# Patient Record
Sex: Male | Born: 1940 | Race: White | Hispanic: No | Smoking: Never smoker
Health system: Southern US, Community
[De-identification: ages and names within clinical notes are randomized; demographics above are authoritative.]

## PROBLEM LIST (undated history)

## (undated) DIAGNOSIS — G4730 Sleep apnea, unspecified: Secondary | ICD-10-CM

## (undated) DIAGNOSIS — R413 Other amnesia: Secondary | ICD-10-CM

## (undated) DIAGNOSIS — E039 Hypothyroidism, unspecified: Secondary | ICD-10-CM

## (undated) DIAGNOSIS — C801 Malignant (primary) neoplasm, unspecified: Secondary | ICD-10-CM

## (undated) DIAGNOSIS — R296 Repeated falls: Secondary | ICD-10-CM

## (undated) DIAGNOSIS — IMO0001 Reserved for inherently not codable concepts without codable children: Secondary | ICD-10-CM

## (undated) DIAGNOSIS — E785 Hyperlipidemia, unspecified: Secondary | ICD-10-CM

## (undated) DIAGNOSIS — K219 Gastro-esophageal reflux disease without esophagitis: Secondary | ICD-10-CM

## (undated) HISTORY — PX: NASAL SEPTUM SURGERY: SHX37

## (undated) HISTORY — PX: TONSILLECTOMY AND ADENOIDECTOMY: SUR1326

## (undated) HISTORY — PX: HEMORRHOID SURGERY: SHX153

## (undated) HISTORY — PX: SHOULDER SURGERY: SHX246

## (undated) HISTORY — DX: Hyperlipidemia, unspecified: E78.5

## (undated) HISTORY — DX: Sleep apnea, unspecified: G47.30

## (undated) HISTORY — DX: Gastro-esophageal reflux disease without esophagitis: K21.9

## (undated) HISTORY — DX: Reserved for inherently not codable concepts without codable children: IMO0001

## (undated) HISTORY — DX: Hypothyroidism, unspecified: E03.9

---

## 2004-02-19 ENCOUNTER — Emergency Department (HOSPITAL_COMMUNITY): Admission: EM | Admit: 2004-02-19 | Discharge: 2004-02-19 | Payer: Self-pay | Admitting: Emergency Medicine

## 2012-09-25 ENCOUNTER — Encounter: Payer: Self-pay | Admitting: *Deleted

## 2012-09-26 DIAGNOSIS — Z283 Underimmunization status: Secondary | ICD-10-CM | POA: Diagnosis not present

## 2012-09-26 DIAGNOSIS — Z2839 Other underimmunization status: Secondary | ICD-10-CM | POA: Diagnosis not present

## 2012-09-26 DIAGNOSIS — S61409A Unspecified open wound of unspecified hand, initial encounter: Secondary | ICD-10-CM | POA: Diagnosis not present

## 2012-09-27 ENCOUNTER — Ambulatory Visit (INDEPENDENT_AMBULATORY_CARE_PROVIDER_SITE_OTHER): Payer: Medicare Other | Admitting: Family Medicine

## 2012-09-27 ENCOUNTER — Encounter: Payer: Self-pay | Admitting: Family Medicine

## 2012-09-27 VITALS — BP 122/80 | HR 70 | Ht 67.0 in | Wt 206.1 lb

## 2012-09-27 DIAGNOSIS — E039 Hypothyroidism, unspecified: Secondary | ICD-10-CM | POA: Diagnosis not present

## 2012-09-27 NOTE — Progress Notes (Signed)
  Subjective:    Patient ID: Omar Lee, male    DOB: [birth year], 72 y.o.   MRN: [id number]  HPI Compliant with medes. No sig changesno new blood tests.  No symptoms of hyperthyroidism no symptoms of hypothyroidism. Patient states doing fine. He understands he needs further blood work. No results found for this or any previous visit.  Review of Systems ROS otherwise negative    Objective:   Physical Exam  Alert no acute distress. Lungs clear. Heart regular rate and rhythm. HEENT normal. Thyroid nonpalpable      Assessment & Plan:  Impression #1 hypothyroidism plan appropriate blood work. patient declines all other screening tests and recommendations. 15 minutes spent most in discussion. WSL

## 2012-09-27 NOTE — Patient Instructions (Addendum)
Take the medicine daily

## 2012-09-28 LAB — TSH: TSH: 20.618 u[IU]/mL — ABNORMAL HIGH (ref 0.350–4.500)

## 2012-09-30 ENCOUNTER — Other Ambulatory Visit: Payer: Self-pay

## 2012-09-30 MED ORDER — LEVOTHYROXINE SODIUM 125 MCG PO TABS: 125.0000 ug | ORAL_TABLET | Freq: Every day | ORAL | Status: DC

## 2012-10-10 DIAGNOSIS — S61409A Unspecified open wound of unspecified hand, initial encounter: Secondary | ICD-10-CM | POA: Diagnosis not present

## 2012-11-20 ENCOUNTER — Emergency Department (HOSPITAL_COMMUNITY)
Admission: EM | Admit: 2012-11-20 | Discharge: 2012-11-20 | Disposition: A | Discharge status: Home / Self Care | Payer: Medicare Other | Attending: Emergency Medicine | Admitting: Emergency Medicine

## 2012-11-20 ENCOUNTER — Encounter (HOSPITAL_COMMUNITY): Payer: Self-pay

## 2012-11-20 ENCOUNTER — Other Ambulatory Visit (INDEPENDENT_AMBULATORY_CARE_PROVIDER_SITE_OTHER): Payer: Self-pay | Admitting: *Deleted

## 2012-11-20 ENCOUNTER — Emergency Department (HOSPITAL_COMMUNITY): Payer: Medicare Other

## 2012-11-20 DIAGNOSIS — T18128A Food in esophagus causing other injury, initial encounter: Secondary | ICD-10-CM

## 2012-11-20 DIAGNOSIS — Z8719 Personal history of other diseases of the digestive system: Secondary | ICD-10-CM | POA: Diagnosis not present

## 2012-11-20 DIAGNOSIS — IMO0002 Reserved for concepts with insufficient information to code with codable children: Secondary | ICD-10-CM | POA: Insufficient documentation

## 2012-11-20 DIAGNOSIS — T17208A Unspecified foreign body in pharynx causing other injury, initial encounter: Secondary | ICD-10-CM | POA: Diagnosis not present

## 2012-11-20 DIAGNOSIS — T18108A Unspecified foreign body in esophagus causing other injury, initial encounter: Secondary | ICD-10-CM | POA: Diagnosis not present

## 2012-11-20 DIAGNOSIS — Y9389 Activity, other specified: Secondary | ICD-10-CM | POA: Insufficient documentation

## 2012-11-20 DIAGNOSIS — E039 Hypothyroidism, unspecified: Secondary | ICD-10-CM | POA: Insufficient documentation

## 2012-11-20 DIAGNOSIS — Z8639 Personal history of other endocrine, nutritional and metabolic disease: Secondary | ICD-10-CM | POA: Insufficient documentation

## 2012-11-20 DIAGNOSIS — Z79899 Other long term (current) drug therapy: Secondary | ICD-10-CM | POA: Diagnosis not present

## 2012-11-20 DIAGNOSIS — R1310 Dysphagia, unspecified: Secondary | ICD-10-CM

## 2012-11-20 DIAGNOSIS — Y929 Unspecified place or not applicable: Secondary | ICD-10-CM | POA: Insufficient documentation

## 2012-11-20 DIAGNOSIS — Z862 Personal history of diseases of the blood and blood-forming organs and certain disorders involving the immune mechanism: Secondary | ICD-10-CM | POA: Diagnosis not present

## 2012-11-20 DIAGNOSIS — R079 Chest pain, unspecified: Secondary | ICD-10-CM | POA: Diagnosis not present

## 2012-11-20 DIAGNOSIS — K228 Other specified diseases of esophagus: Secondary | ICD-10-CM | POA: Insufficient documentation

## 2012-11-20 DIAGNOSIS — Z9889 Other specified postprocedural states: Secondary | ICD-10-CM | POA: Diagnosis not present

## 2012-11-20 DIAGNOSIS — K2289 Other specified disease of esophagus: Secondary | ICD-10-CM | POA: Insufficient documentation

## 2012-11-20 LAB — CBC WITH DIFFERENTIAL/PLATELET
Basophils Absolute: 0 10*3/uL (ref 0.0–0.1)
Basophils Relative: 0 % (ref 0–1)
Hemoglobin: 13.5 g/dL (ref 13.0–17.0)
MCHC: 34.1 g/dL (ref 30.0–36.0)
Neutro Abs: 15.8 10*3/uL — ABNORMAL HIGH (ref 1.7–7.7)
Neutrophils Relative %: 91 % — ABNORMAL HIGH (ref 43–77)
RDW: 13.9 % (ref 11.5–15.5)
WBC: 17.5 10*3/uL — ABNORMAL HIGH (ref 4.0–10.5)

## 2012-11-20 LAB — BASIC METABOLIC PANEL
Chloride: 103 mEq/L (ref 96–112)
GFR calc Af Amer: >90 mL/min (ref >90)
Potassium: 3.9 mEq/L (ref 3.5–5.1)
Sodium: 139 mEq/L (ref 135–145)

## 2012-11-20 NOTE — ED Notes (Signed)
Pt reports feels like has a piece of ham stuck in throat since eating breakfast this am.  Pt alert, talkative.  No cough, drooling, or SOB.  Pt says hurts to swallow.

## 2012-11-20 NOTE — ED Notes (Signed)
Pt reports he got the hiccups right before he went to xray and when he stood for film he felt like pc of food was dislodged from throat. Pt denies SOB and the inability to swallow own saliva.

## 2012-11-20 NOTE — ED Notes (Signed)
Pt presents from urgent care after being referred here secondary to a pc of breakfast ham caught in his throat this morning. Pt reports this has happened before, and appears to be worsening. No respiratory distress noted. Pt is able to swallow his own saliva. Denies SOB, and emesis. Pt reports epigastric discomfort at this time. Will continue to monitor.

## 2012-11-20 NOTE — ED Provider Notes (Signed)
CSN: [id number]     Arrival date & time 11/20/12  1010 History    This chart was scribed for Gilda Crease, MD,  by Ashley Jacobs, ED Scribe. The patient was seen in room APA10/APA10 and the patient's care was started at 10:28 AM    First MD Initiated Contact with Patient 11/20/12 1019     Chief Complaint  Patient presents with  . foreign body in throat    (Consider location/radiation/quality/duration/timing/severity/associated sxs/prior Treatment) The history is provided by the patient and medical records. No language interpreter was used.   HPI Comments: Omar Lee is a 72 y.o. male who presents to the Emergency Department complaining of foreign body in throat after eating breakfast the morning PTA. Pt is able to talk and is alert. Pt reports that he is able to swallow water however he experiences moderate constant upper esophageal pain soon afterwards.  Pt mentions that he has hx of food getting lodged in his throat and it is intermittently, gradually worsening. Denies CP and trouble breathing. Pt has hx of acid reflux and denies any current episodes.    Past Medical History  Diagnosis Date  . Sleep apnea   . Reflux   . Hypothyroidism   . Hyperlipidemia    Past Surgical History  Procedure Laterality Date  . Tonsillectomy and adenoidectomy    . Shoulder surgery Right   . Hemorrhoid surgery    . Nasal septum surgery     Family History  Problem Relation Age of Onset  . Hypertension Mother   . Heart attack Mother   . Heart attack Father   . Diabetes Brother    History  Substance Use Topics  . Smoking status: Never Smoker   . Smokeless tobacco: Not on file  . Alcohol Use: No    Review of Systems  HENT: Positive for trouble swallowing.        Throat pain  Respiratory: Negative for choking and shortness of breath.   Cardiovascular: Negative for chest pain.  All other systems reviewed and are negative.    Allergies  Review of patient's allergies  indicates no known allergies.  Home Medications   Current Outpatient Rx  Name  Route  Sig  Dispense  Refill  . levothyroxine (SYNTHROID) 125 MCG tablet   Oral   Take 1 tablet (125 mcg total) by mouth daily.   90 tablet   1    BP 136/66  Pulse 88  Temp(Src) 98.2 F (36.8 C) (Oral)  Resp 18  Ht 5\' 7"  (1.702 m)  Wt 195 lb (88.451 kg)  BMI 30.53 kg/m2  SpO2 99% Physical Exam  Nursing note and vitals reviewed. Constitutional: He is oriented to person, place, and time. He appears well-developed and well-nourished. No distress.  HENT:  Head: Normocephalic and atraumatic.  Right Ear: Hearing normal.  Left Ear: Hearing normal.  Nose: Nose normal.  Mouth/Throat: Oropharynx is clear and moist and mucous membranes are normal.  Eyes: Conjunctivae and EOM are normal. Pupils are equal, round, and reactive to light.  Neck: Normal range of motion. Neck supple.  Cardiovascular: Regular rhythm, S1 normal and S2 normal.  Exam reveals no gallop and no friction rub.   No murmur heard. Pulmonary/Chest: Effort normal and breath sounds normal. No respiratory distress. He exhibits no tenderness.  Abdominal: Soft. Normal appearance and bowel sounds are normal. There is no hepatosplenomegaly. There is no tenderness. There is no rebound, no guarding, no tenderness at McBurney's point and negative Murphy's  sign. No hernia.  Musculoskeletal: Normal range of motion.  Neurological: He is alert and oriented to person, place, and time. He has normal strength. No cranial nerve deficit or sensory deficit. Coordination normal. GCS eye subscore is 4. GCS verbal subscore is 5. GCS motor subscore is 6.  Skin: Skin is warm, dry and intact. No rash noted. No cyanosis.  Psychiatric: He has a normal mood and affect. His speech is normal and behavior is normal. Thought content normal.    ED Course  DIAGNOSTIC STUDIES: Oxygen Saturation is 99% on room air, normal by my interpretation.    COORDINATION OF CARE: 10:31  AM Discussed course of care with pt which includes chest x-ray. Pt understands and agrees.     Procedures (including critical care time)  Labs Reviewed  CBC WITH DIFFERENTIAL  BASIC METABOLIC PANEL   Dg Neck Soft Tissue  11/20/2012   *RADIOLOGY REPORT*  Clinical Data: Food stuck in throat, pain with swallowing  NECK SOFT TISSUES - 1+ VIEW  Comparison: None.  Findings: The lateral view of the neck is somewhat light in technique.  However no opaque foreign body is seen.  The cervical vertebrae are in normal alignment with degenerative disc disease at C5-6 and C6-7 levels.  The epiglottis appears normal.  IMPRESSION: No foreign body is seen.  Degenerative change is noted in the lower cervical spine.   Original Report Authenticated By: Dwyane Dee, M.D.   Dg Chest 2 View  11/20/2012   *RADIOLOGY REPORT*  Clinical Data: Food stuck in the throat.  Chest pain.  CHEST - 2 VIEW  Comparison: No priors.  Findings: Lung volumes are normal.  No consolidative airspace disease.  No pleural effusions.  No pneumothorax.  No pulmonary nodule or mass noted.  Pulmonary vasculature and the cardiomediastinal silhouette are within normal limits. Atherosclerosis in the thoracic aorta.  IMPRESSION: 1. No radiographic evidence of acute cardiopulmonary disease. 2.  Atherosclerosis.   Original Report Authenticated By: Trudie Reed, M.D.   Diagnosis: Esophageal impaction - resolved  MDM  Patient presents to the ER because he thinks he has a piece of ham stuck in his throat. Patient reports that over the last month he has had 2 to his food very well because it has been getting stuck when he swallows. He reports that in the past has always eventually passed, but today he had a piece of ham stick and it feels like it is still stuck in the upper throat. Reports pain when he swallows.  Workup negative. During the time period that he was radiology felt the blockage past. Patient now swelling without difficulty. History seems  consistent with esophageal stricture which will require evaluation. Discussed briefly with Doctor Karilyn Cota, he will contact the patient at home and will see the patient in the next 2 days. Patient will return if he has any further problems.  I personally performed the services described in this documentation, which was scribed in my presence. The recorded information has been reviewed and is accurate.     Gilda Crease, MD 11/20/12 1240

## 2012-11-21 ENCOUNTER — Encounter (HOSPITAL_COMMUNITY): Payer: Self-pay | Admitting: Pharmacy Technician

## 2012-11-22 ENCOUNTER — Encounter (HOSPITAL_COMMUNITY)
Admission: RE | Disposition: A | Discharge status: Home / Self Care | Payer: Self-pay | Source: Ambulatory Visit | Attending: Internal Medicine

## 2012-11-22 ENCOUNTER — Encounter (HOSPITAL_COMMUNITY): Payer: Self-pay | Admitting: *Deleted

## 2012-11-22 ENCOUNTER — Ambulatory Visit (HOSPITAL_COMMUNITY)
Admission: RE | Admit: 2012-11-22 | Discharge: 2012-11-22 | Disposition: A | Discharge status: Home / Self Care | Payer: Medicare Other | Source: Ambulatory Visit | Attending: Internal Medicine | Admitting: Internal Medicine

## 2012-11-22 DIAGNOSIS — R1310 Dysphagia, unspecified: Secondary | ICD-10-CM | POA: Diagnosis not present

## 2012-11-22 DIAGNOSIS — K222 Esophageal obstruction: Secondary | ICD-10-CM | POA: Insufficient documentation

## 2012-11-22 DIAGNOSIS — K449 Diaphragmatic hernia without obstruction or gangrene: Secondary | ICD-10-CM | POA: Insufficient documentation

## 2012-11-22 DIAGNOSIS — K2270 Barrett's esophagus without dysplasia: Secondary | ICD-10-CM | POA: Diagnosis not present

## 2012-11-22 HISTORY — PX: ESOPHAGOGASTRODUODENOSCOPY (EGD) WITH ESOPHAGEAL DILATION: SHX5812

## 2012-11-22 SURGERY — ESOPHAGOGASTRODUODENOSCOPY (EGD) WITH ESOPHAGEAL DILATION
Anesthesia: Moderate Sedation

## 2012-11-22 MED ORDER — STERILE WATER FOR IRRIGATION IR SOLN
Status: DC | PRN
  Administered 2012-11-22: 11:00:00

## 2012-11-22 MED ORDER — MIDAZOLAM HCL 5 MG/5ML IJ SOLN
INTRAMUSCULAR | Status: AC
  Filled 2012-11-22: qty 10

## 2012-11-22 MED ORDER — BUTAMBEN-TETRACAINE-BENZOCAINE 2-2-14 % EX AERO
INHALATION_SPRAY | CUTANEOUS | Status: DC | PRN
  Administered 2012-11-22: 2 via TOPICAL

## 2012-11-22 MED ORDER — MEPERIDINE HCL 25 MG/ML IJ SOLN
INTRAMUSCULAR | Status: DC | PRN
  Administered 2012-11-22 (×2): 25 mg via INTRAVENOUS

## 2012-11-22 MED ORDER — SODIUM CHLORIDE 0.9 % IV SOLN
INTRAVENOUS | Status: DC
  Administered 2012-11-22: 11:00:00 via INTRAVENOUS

## 2012-11-22 MED ORDER — MEPERIDINE HCL 50 MG/ML IJ SOLN
INTRAMUSCULAR | Status: AC
  Filled 2012-11-22: qty 1

## 2012-11-22 MED ORDER — PANTOPRAZOLE SODIUM 40 MG PO TBEC: 40.0000 mg | DELAYED_RELEASE_TABLET | Freq: Two times a day (BID) | ORAL | Status: DC

## 2012-11-22 MED ORDER — MIDAZOLAM HCL 5 MG/5ML IJ SOLN
INTRAMUSCULAR | Status: DC | PRN
  Administered 2012-11-22: 2 mg via INTRAVENOUS
  Administered 2012-11-22: 1 mg via INTRAVENOUS
  Administered 2012-11-22: 2 mg via INTRAVENOUS

## 2012-11-22 NOTE — Op Note (Signed)
EGD PROCEDURE REPORT  PATIENT:  Omar Lee  MR#:  [id number] Birthdate:  December 05, 1940, 72 y.o., male Endoscopist:  Dr. Malissa Hippo, MD Referred By:  Dr. Jaci Carrel, MD  Procedure Date: 11/22/2012  Procedure:   EGD with ED(balloon).  Indications:  Patient is 72 year old Caucasian male who presents with one-month history of dysphagia to solids. He gives several year history of heartburn. He used to be on PPI as previously. He says he change his eating habits and he hasn't experienced heartburn in one year.            Informed Consent:  The risks, benefits, alternatives & imponderables which include, but are not limited to, bleeding, infection, perforation, drug reaction and potential missed lesion have been reviewed.  The potential for biopsy, lesion removal, esophageal dilation, etc. have also been discussed.  Questions have been answered.  All parties agreeable.  Please see history & physical in medical record for more information.  Medications:  Demerol 50 mg IV Versed 5 mg IV Cetacaine spray topically for oropharyngeal anesthesia  Description of procedure:  The endoscope was introduced through the mouth and advanced to the second portion of the duodenum without difficulty or limitations. The mucosal surfaces were surveyed very carefully during advancement of the scope and upon withdrawal.  Findings:  Esophagus:  Mucosa of the proximal 3-4 cm was normal. Extensive ulceration noted to mucosa from 20-23 cm. Tubular [place] at proximal margin it 34 cm from the incisors. Stricture noted at 27 cm from the incisors. GEJ:  34 cm Hiatus:  38 cm Stomach:  Stomach was empty and distended very well with insufflation. Folds in the proximal stomach are normal. Examination of mucosa at body, antrum, pyloric channel, and the nurse fundus and cardia was normal. Duodenum:  Normal bulbar and post bulbar mucosa.  Therapeutic/Diagnostic Maneuvers Performed:   Facial stricture was dilated  with balloon dilator. A balloon dilator was advanced with the scope. Guidewire portion the gastric lumen. The dilator was positioned across the stricture and insufflated to diameter of 15 mm resulting in mucosal disruption. Balloon was then deflated withdrawn. Biopsy was not taken for medicine mucosa because of ongoing inflammation.   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 Moderate-sized sliding hiatal hernia.  Recommendations:  Continue anti-reflux measures. Pantoprazole 40 mg by mouth twice a day. Office visit in 8 weeks.  [name] U  11/22/2012  11:35 AM  CC: Dr. Harlow Asa, MD & Dr. Bonnetta Barry ref. provider found

## 2012-11-22 NOTE — H&P (Signed)
Omar Lee is an 72 y.o. male.   Chief Complaint: Patient's here for EGD and ED. HPI: Patient is 72 year old Caucasian male who presents with one-month history of dysphagia to solids. He's had multiple episodes recently where he spontaneously. 2 days ago he was in emergency room and had spontaneous relief. He has no difficulty with liquids. He denies sore throat chronic cough or hoarseness. He gives a several-day history of heartburn. One year ago he changed his eating habits and has not had any problems. He has good appetite and has not lost any weight.  Past Medical History  Diagnosis Date  . Sleep apnea   . Reflux   . Hypothyroidism   . Hyperlipidemia     Past Surgical History  Procedure Laterality Date  . Tonsillectomy and adenoidectomy    . Shoulder surgery Right   . Hemorrhoid surgery    . Nasal septum surgery      Family History  Problem Relation Age of Onset  . Hypertension Mother   . Heart attack Mother   . Heart attack Father   . Diabetes Brother    Social History:  reports that he has never smoked. He does not have any smokeless tobacco history on file. He reports that he does not drink alcohol or use illicit drugs.  Allergies: No Known Allergies  Medications Prior to Admission  Medication Sig Dispense Refill  . levothyroxine (SYNTHROID) 125 MCG tablet Take 1 tablet (125 mcg total) by mouth daily.  90 tablet  1    Results for orders placed during the hospital encounter of 11/20/12 (from the past 48 hour(s))  CBC WITH DIFFERENTIAL     Status: Abnormal   Collection Time    11/20/12 11:16 AM      Result Value Range   WBC 17.5 (*) 4.0 - 10.5 K/uL   RBC 4.23  4.22 - 5.81 MIL/uL   Hemoglobin 13.5  13.0 - 17.0 g/dL   HCT 40.9  81.1 - 91.4 %   MCV 93.6  78.0 - 100.0 fL   MCH 31.9  26.0 - 34.0 pg   MCHC 34.1  30.0 - 36.0 g/dL   RDW 78.2  95.6 - 21.3 %   Platelets 257  150 - 400 K/uL   Neutrophils Relative % 91 (*) 43 - 77 %   Neutro Abs 15.8 (*) 1.7 - 7.7  K/uL   Lymphocytes Relative 4 (*) 12 - 46 %   Lymphs Abs 0.8  0.7 - 4.0 K/uL   Monocytes Relative 5  3 - 12 %   Monocytes Absolute 0.9  0.1 - 1.0 K/uL   Eosinophils Relative 0  0 - 5 %   Eosinophils Absolute 0.0  0.0 - 0.7 K/uL   Basophils Relative 0  0 - 1 %   Basophils Absolute 0.0  0.0 - 0.1 K/uL  BASIC METABOLIC PANEL     Status: Abnormal   Collection Time    11/20/12 11:16 AM      Result Value Range   Sodium 139  135 - 145 mEq/L   Potassium 3.9  3.5 - 5.1 mEq/L   Chloride 103  96 - 112 mEq/L   CO2 26  19 - 32 mEq/L   Glucose, Bld 115 (*) 70 - 99 mg/dL   BUN 17  6 - 23 mg/dL   Creatinine, Ser 0.86  0.50 - 1.35 mg/dL   Calcium 9.1  8.4 - 57.8 mg/dL   GFR calc non Af Amer 82 (*) >  90 mL/min   GFR calc Af Amer >90  >90 mL/min   Comment:            The eGFR has been calculated     using the CKD EPI equation.     This calculation has not been     validated in all clinical     situations.     eGFR's persistently     <90 mL/min signify     possible Chronic Kidney Disease.   No results found.  ROS  Blood pressure 141/84, pulse 74, temperature 98.3 F (36.8 C), temperature source Oral, resp. rate 20, height 5\' 7"  (1.702 m), weight 195 lb (88.451 kg), SpO2 95.00%. Physical Exam  Constitutional: He appears well-developed and well-nourished.  HENT:  Mouth/Throat: Oropharynx is clear and moist.  Evidence of uvulectomy  Eyes: Conjunctivae are normal. No scleral icterus.  Cardiovascular: Normal rate, regular rhythm and normal heart sounds.   No murmur heard. Respiratory: Effort normal and breath sounds normal.  GI: Soft. He exhibits no distension and no mass. There is no tenderness.  Musculoskeletal: He exhibits no edema.  Neurological: He is alert.  Skin: Skin is warm and dry.     Assessment/Plan Solid food dysphagia. History of GERD. EGD, possible ED.  Omar Lee U 11/22/2012, 11:12 AM

## 2012-11-25 ENCOUNTER — Encounter (HOSPITAL_COMMUNITY): Payer: Self-pay | Admitting: Internal Medicine

## 2012-11-27 ENCOUNTER — Telehealth (INDEPENDENT_AMBULATORY_CARE_PROVIDER_SITE_OTHER): Payer: Self-pay | Admitting: *Deleted

## 2012-11-27 NOTE — Telephone Encounter (Signed)
LM for patient to return the call to schedule a f/u apt.   

## 2012-11-27 NOTE — Telephone Encounter (Signed)
Per EGD op note, patient needs 8 wk f/u

## 2012-12-03 ENCOUNTER — Telehealth (INDEPENDENT_AMBULATORY_CARE_PROVIDER_SITE_OTHER): Payer: Self-pay | Admitting: *Deleted

## 2012-12-03 NOTE — Telephone Encounter (Signed)
Apt has been scheduled for 01/28/13 with Dr. Karilyn Cota.

## 2012-12-03 NOTE — Telephone Encounter (Signed)
Omar Lee would like to see if Dr. Karilyn Cota would send in a corrected Rx for a 3 month supply of Pantoprazole 40 mg. This will be cheaper for him.  He uses [facility]. His return phone number is [phone].

## 2012-12-04 ENCOUNTER — Other Ambulatory Visit (INDEPENDENT_AMBULATORY_CARE_PROVIDER_SITE_OTHER): Payer: Self-pay | Admitting: *Deleted

## 2012-12-04 DIAGNOSIS — K219 Gastro-esophageal reflux disease without esophagitis: Secondary | ICD-10-CM

## 2012-12-04 MED ORDER — PANTOPRAZOLE SODIUM 40 MG PO TBEC: 40.0000 mg | DELAYED_RELEASE_TABLET | Freq: Two times a day (BID) | ORAL | Status: DC

## 2012-12-04 NOTE — Telephone Encounter (Signed)
Omar Lee has called and ask that this prescription be sent to his mail order pharmacy as it would be cheaper for him.

## 2012-12-04 NOTE — Telephone Encounter (Signed)
I have sent a refill request to Dr.Rehman.

## 2013-01-20 ENCOUNTER — Encounter (INDEPENDENT_AMBULATORY_CARE_PROVIDER_SITE_OTHER): Payer: Self-pay | Admitting: Internal Medicine

## 2013-01-20 ENCOUNTER — Ambulatory Visit (INDEPENDENT_AMBULATORY_CARE_PROVIDER_SITE_OTHER): Payer: Medicare Other | Admitting: Internal Medicine

## 2013-01-20 VITALS — BP 110/68 | HR 72 | Temp 98.1°F | Resp 18 | Ht 67.5 in | Wt 201.2 lb

## 2013-01-20 DIAGNOSIS — K2270 Barrett's esophagus without dysplasia: Secondary | ICD-10-CM

## 2013-01-20 DIAGNOSIS — K219 Gastro-esophageal reflux disease without esophagitis: Secondary | ICD-10-CM | POA: Diagnosis not present

## 2013-01-20 DIAGNOSIS — K209 Esophagitis, unspecified without bleeding: Secondary | ICD-10-CM | POA: Diagnosis not present

## 2013-01-20 NOTE — Patient Instructions (Signed)
EGD with biopsy in 4 months. Call if you have swallowing difficulty or medication quits working.

## 2013-01-20 NOTE — Progress Notes (Signed)
Presenting complaint;  Followup for complicated GERD.  Database;  Patient is 72 year old Caucasian male who has history of GERD and underwent EGD for dysphagia on 11/22/2012 and found to have a centimeter long tubular Barrett's with ulcers and stricture at junction of squamous and Barrett's epithelium. The stricture was dilated with balloon to 15 mm. He's been maintained on double dose PPI.  Subjective;  He feels great. He hasn't had any problems since his procedure 2 months ago. He denies heartburn dysphagia regurgitation hoarseness chronic cough or sore throat. He's not able to rest at night. He is not having any side effects with therapy. He denies abdominal pain or melena. His last colonoscopy was done over 10 years ago and he is not interested in having one now. He denies a recent weight gain.   Current Medications: Current Outpatient Prescriptions  Medication Sig Dispense Refill  . levothyroxine (SYNTHROID) 125 MCG tablet Take 1 tablet (125 mcg total) by mouth daily.  90 tablet  1  . pantoprazole (PROTONIX) 40 MG tablet Take 1 tablet (40 mg total) by mouth 2 (two) times daily before a meal.  60 tablet  5   No current facility-administered medications for this visit.     Objective: Blood pressure 110/68, pulse 72, temperature 98.1 F (36.7 C), temperature source Oral, resp. rate 18, height 5' 7.5" (1.715 m), weight 201 lb 3.2 oz (91.264 kg). Patient is alert and in no acute distress. Conjunctiva is pink. Sclera is nonicteric Oropharyngeal mucosa is normal. No neck masses or thyromegaly noted. Cardiac exam with regular rhythm normal S1 and S2. No murmur or gallop noted. Lungs are clear to auscultation. Abdomen is full but soft and nontender without organomegaly or masses.  No LE edema or clubbing noted.    Assessment:  #1. 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 Biopsy was not taken because of ongoing inflammation  and will be done on his next EGD. Symptom control appears to be excellent. If esophagitis is healed on next EGD will consider dropping PPI dose to once a day. #2. Patient is average risk for CRC and he is not interested in undergoing screening colonoscopy.    Plan:  Continue pantoprazole at 40 mg by mouth twice a day. Patient advised to make an effort to lose some weight. EGD with biopsy and esophageal dilation if indicated in 4 months. Procedure may have to be done at an earlier date should he develop dysphagia. Office visit in one year.

## 2013-01-28 ENCOUNTER — Ambulatory Visit (INDEPENDENT_AMBULATORY_CARE_PROVIDER_SITE_OTHER): Payer: Medicare Other | Admitting: Internal Medicine

## 2013-03-06 ENCOUNTER — Other Ambulatory Visit: Payer: Self-pay | Admitting: Family Medicine

## 2013-05-21 ENCOUNTER — Encounter (INDEPENDENT_AMBULATORY_CARE_PROVIDER_SITE_OTHER): Payer: Self-pay | Admitting: *Deleted

## 2013-09-02 ENCOUNTER — Other Ambulatory Visit: Payer: Self-pay | Admitting: Family Medicine

## 2013-10-21 ENCOUNTER — Encounter (INDEPENDENT_AMBULATORY_CARE_PROVIDER_SITE_OTHER): Payer: Self-pay | Admitting: *Deleted

## 2013-11-08 ENCOUNTER — Other Ambulatory Visit: Payer: Self-pay | Admitting: Family Medicine

## 2014-01-28 ENCOUNTER — Encounter (INDEPENDENT_AMBULATORY_CARE_PROVIDER_SITE_OTHER): Payer: Medicare Other | Admitting: Internal Medicine

## 2014-01-28 NOTE — Progress Notes (Signed)
   Subjective:    Patient ID: Omar Lee, male    DOB: [birth year], 73 y.o.   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  Moderate-sized sliding hiatal hernia.     Review of Systems     Objective:   Physical Exam        Assessment & Plan:   This encounter was created in error - please disregard.

## 2014-02-02 ENCOUNTER — Telehealth: Payer: Self-pay | Admitting: Family Medicine

## 2014-02-02 DIAGNOSIS — E039 Hypothyroidism, unspecified: Secondary | ICD-10-CM

## 2014-02-02 NOTE — Telephone Encounter (Signed)
Last seen 09/2012

## 2014-02-02 NOTE — Telephone Encounter (Signed)
Pt needs refill on his Levothyroxine but says he needs blood work done with it first  Please call pt when BW orders sent

## 2014-02-02 NOTE — Addendum Note (Signed)
Addended by: Jesusita Oka on: 02/02/2014 03:05 PM   Modules accepted: Orders

## 2014-02-02 NOTE — Telephone Encounter (Signed)
Notified patient bloodwork has been ordered and needs to schedule an office visit. Patient agreed and verbalized understanding.

## 2014-02-02 NOTE — Telephone Encounter (Signed)
Needs tsh plus ov not seen sicnce June 2014

## 2014-02-03 ENCOUNTER — Telehealth (INDEPENDENT_AMBULATORY_CARE_PROVIDER_SITE_OTHER): Payer: Self-pay | Admitting: *Deleted

## 2014-02-03 ENCOUNTER — Encounter (INDEPENDENT_AMBULATORY_CARE_PROVIDER_SITE_OTHER): Payer: Self-pay | Admitting: *Deleted

## 2014-02-03 LAB — TSH: TSH: 6.125 u[IU]/mL — ABNORMAL HIGH (ref 0.350–4.500)

## 2014-02-03 NOTE — Telephone Encounter (Signed)
Omar Lee NO SHOWED for his apt with Terri Setzer, NP on 01/28/14. A NS letter has been mailed.  

## 2014-02-06 ENCOUNTER — Other Ambulatory Visit: Payer: Self-pay | Admitting: Family Medicine

## 2014-02-11 ENCOUNTER — Ambulatory Visit (INDEPENDENT_AMBULATORY_CARE_PROVIDER_SITE_OTHER): Payer: Medicare Other | Admitting: Family Medicine

## 2014-02-11 ENCOUNTER — Encounter: Payer: Self-pay | Admitting: Family Medicine

## 2014-02-11 ENCOUNTER — Ambulatory Visit: Payer: BC Managed Care – PPO | Admitting: Family Medicine

## 2014-02-11 VITALS — BP 114/82 | Ht 67.0 in | Wt 194.4 lb

## 2014-02-11 DIAGNOSIS — E039 Hypothyroidism, unspecified: Secondary | ICD-10-CM | POA: Diagnosis not present

## 2014-02-11 MED ORDER — LEVOTHYROXINE SODIUM 125 MCG PO TABS: ORAL_TABLET | ORAL | Status: DC

## 2014-02-11 NOTE — Progress Notes (Signed)
   Subjective:    Patient ID: Omar Lee, male    DOB: 08-23-1940, 73 y.o.   MRN: [id number]  HPI Patient is here today for his hypothyroidism follow up visit. Patient states that he is suppose to be taking Levothyroxine 125 mcg but the past 2 months he has been taking his old dose of 112 mcg. Patient states that he has no other concerns at this time.    Patient notes no excessive fatigue.  Notes no constipation. No abdominal pain no chest pain. No weight loss no weight gain  Review of Systems No headache no chest pain no rash no edema ROS otherwise negative    Objective:   Physical Exam Alert no apparent distress HEENT normal. Lungs clear. Heart regular rate and rhythm. Thyroid nonpalpable.       Assessment & Plan:  Impression 1 hypothyroidism control suboptimal. Plan increase levothyroid back up to 125 g. Patient declines all other preventive and primary care interventions. WS

## 2014-05-06 MED ORDER — LEVOTHYROXINE SODIUM 125 MCG PO TABS: ORAL_TABLET | ORAL | Status: DC

## 2014-05-06 NOTE — Addendum Note (Signed)
Addended byCharolotte Capuchin D on: 05/06/2014 04:47 PM   Modules accepted: Orders

## 2014-10-12 ENCOUNTER — Other Ambulatory Visit: Payer: Self-pay

## 2015-04-09 ENCOUNTER — Encounter: Payer: Self-pay | Admitting: Family Medicine

## 2015-04-09 ENCOUNTER — Ambulatory Visit (INDEPENDENT_AMBULATORY_CARE_PROVIDER_SITE_OTHER): Payer: Medicare Other | Admitting: Family Medicine

## 2015-04-09 VITALS — BP 122/78 | Ht 67.0 in | Wt 191.0 lb

## 2015-04-09 DIAGNOSIS — E039 Hypothyroidism, unspecified: Secondary | ICD-10-CM

## 2015-04-09 MED ORDER — LEVOTHYROXINE SODIUM 125 MCG PO TABS: ORAL_TABLET | ORAL | Status: DC

## 2015-04-09 NOTE — Progress Notes (Signed)
   Subjective:    Patient ID: Omar Lee, male    DOB: 11-Jun-1940, 74 y.o.   MRN: [id number]  HPIpt arrives today for a med check up.  Hypothyroidism. Takes levothyroxine 125 mcg daily. No problems or concerns. Needs refills.  Declines flu vaccine.    Hx of stricture, none present now, had dilatation 1. No longer taking Protonix   Walking regulary   Occasional rare transient dizziness with left ear fullness  Review of Systems    no headache no chest pain no shortness of breath Objective:   Physical Exam   Alert vitals stable lungs clear thyroid nonpalpable heart rare rhythm tympanic membranes normal     Assessment & Plan:  Impression 1 hypothyroidism status uncertain #2 nonspecific left ear symptoms with accompanying dizziness patient wishes no further workup planned declines all preventive interventions. Proper blood work. Refill thyroid diet exercise discussed WSL

## 2015-04-10 LAB — TSH: TSH: 1.24 u[IU]/mL (ref 0.450–4.500)

## 2015-04-12 ENCOUNTER — Encounter: Payer: Self-pay | Admitting: Family Medicine

## 2015-04-22 ENCOUNTER — Telehealth: Payer: Self-pay | Admitting: Family Medicine

## 2015-04-22 ENCOUNTER — Other Ambulatory Visit: Payer: Self-pay | Admitting: *Deleted

## 2015-04-22 NOTE — Telephone Encounter (Signed)
Read to pt letter i dictated day after christma and tell thim that was done then, not sure why not sent/received,

## 2015-04-22 NOTE — Telephone Encounter (Signed)
Discussed with pt and letter resent.

## 2015-04-22 NOTE — Telephone Encounter (Signed)
Pt is wanting to know the results of his last thyroid lab.

## 2015-08-18 ENCOUNTER — Other Ambulatory Visit: Payer: Self-pay

## 2015-08-18 NOTE — Patient Outreach (Signed)
[facility]) Care Management  08/18/2015  Omar Lee [birth year] [id number]   Unsuccessful attempt to reach patient referred for screening from the [place] list. HIPPA compliant message left requesting call back. If no response RN will make another attempt within one week.  Candie Mile, RN, MSN [place] [phone] Fax [phone]

## 2015-08-19 ENCOUNTER — Other Ambulatory Visit: Payer: Self-pay

## 2015-08-19 NOTE — Patient Outreach (Signed)
[facility]) Care Management  08/19/2015  ANTERO MELROY 02/27/1941 [id number]   Second attempt to reach patient referred for screening from [place] list.  Patient answered the phone and identified himself, but quickly stated he was not interested and hung up.  Plan:  Notify PCP and close case.  Candie Mile, RN, MSN [place] [phone] Fax [phone]

## 2016-04-07 ENCOUNTER — Other Ambulatory Visit: Payer: Self-pay | Admitting: Family Medicine

## 2016-06-14 ENCOUNTER — Ambulatory Visit: Payer: Medicare Other | Admitting: Family Medicine

## 2016-06-22 ENCOUNTER — Encounter: Payer: Self-pay | Admitting: Family Medicine

## 2016-06-22 ENCOUNTER — Ambulatory Visit (INDEPENDENT_AMBULATORY_CARE_PROVIDER_SITE_OTHER): Payer: Medicare HMO | Admitting: Family Medicine

## 2016-06-22 VITALS — BP 122/78 | Ht 67.0 in | Wt 194.6 lb

## 2016-06-22 DIAGNOSIS — R5383 Other fatigue: Secondary | ICD-10-CM

## 2016-06-22 DIAGNOSIS — E039 Hypothyroidism, unspecified: Secondary | ICD-10-CM | POA: Diagnosis not present

## 2016-06-22 DIAGNOSIS — S060X0A Concussion without loss of consciousness, initial encounter: Secondary | ICD-10-CM

## 2016-06-22 DIAGNOSIS — Z79899 Other long term (current) drug therapy: Secondary | ICD-10-CM

## 2016-06-22 DIAGNOSIS — Z13220 Encounter for screening for lipoid disorders: Secondary | ICD-10-CM

## 2016-06-22 MED ORDER — LEVOTHYROXINE SODIUM 125 MCG PO TABS: 125.0000 ug | ORAL_TABLET | Freq: Every day | ORAL | 3 refills | Status: DC

## 2016-06-22 NOTE — Progress Notes (Signed)
   Subjective:    Patient ID: Omar Lee, male    DOB: Dec 19, 1940, 76 y.o.   MRN: [id number]  HPI  Patient arrives for a follow up on thyroid medication. This patient who has come in the last 4 years and insists that he only needs thyroid medicine and has 0 other problems. Has had a change of heart failure like to talk about several of the incident occurred over the last few years  About 4 years ago. Just in the days after mood losing his daughter to a tragic incident, patient was in the bathroom at the restaurant and he had a bowel movement. He then proceeded to completely pass out. Pt experience d a passing out spell after having a bowel movement   Several weeks ago patient stepped off on a piece of carpet stumbled fell struck his jaw and head. He "saw stars" total nausea and out of it. Immediately got drowsy for the rest day. Did not go the emergency room Pt slipped and took a fall, "saw stars", felt queazzy aftwards  Was in a hurry, ook a fall struck his head and face   Patient notes substantial fatigue Review of Systems No headache, no major weight loss or weight gain, no chest pain no back pain abdominal pain no change in bowel habits complete ROS otherwise negative     Objective:   Physical Exam  Alert vitals stable HEENT normal neuro exam intact. Lungs clear. Heart regular rate and rhythm. Thyroid nonpalpable.      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 Agree still little bit more blood work with recent fatigue. Thyroid refilled.

## 2016-06-23 DIAGNOSIS — Z13220 Encounter for screening for lipoid disorders: Secondary | ICD-10-CM | POA: Diagnosis not present

## 2016-06-23 DIAGNOSIS — Z79899 Other long term (current) drug therapy: Secondary | ICD-10-CM | POA: Diagnosis not present

## 2016-06-23 DIAGNOSIS — E039 Hypothyroidism, unspecified: Secondary | ICD-10-CM | POA: Diagnosis not present

## 2016-06-23 DIAGNOSIS — R5383 Other fatigue: Secondary | ICD-10-CM | POA: Diagnosis not present

## 2016-06-24 LAB — TSH: TSH: 0.418 u[IU]/mL — ABNORMAL LOW (ref 0.450–4.500)

## 2016-06-24 LAB — CBC WITH DIFFERENTIAL/PLATELET
BASOS ABS: 0 10*3/uL (ref 0.0–0.2)
Basos: 0 %
EOS (ABSOLUTE): 0 10*3/uL (ref 0.0–0.4)
Eos: 0 %
HEMOGLOBIN: 6.7 g/dL — AB (ref 13.0–17.7)
Hematocrit: 20.1 % — ABNORMAL LOW (ref 37.5–51.0)
Lymphocytes Absolute: 0.9 10*3/uL (ref 0.7–3.1)
Lymphs: 65 %
MCH: 35.3 pg — ABNORMAL HIGH (ref 26.6–33.0)
MCHC: 33.3 g/dL (ref 31.5–35.7)
MCV: 106 fL — ABNORMAL HIGH (ref 79–97)
MONOCYTES: 1 %
Monocytes Absolute: 0 10*3/uL — ABNORMAL LOW (ref 0.1–0.9)
NEUTROS ABS: 0.5 10*3/uL — AB (ref 1.4–7.0)
NRBC: 13 % — AB (ref 0–0)
Neutrophils: 34 %
PLATELETS: 182 10*3/uL (ref 150–379)
RBC: 1.9 x10E6/uL — AB (ref 4.14–5.80)
RDW: 16.4 % — ABNORMAL HIGH (ref 12.3–15.4)
WBC: 1.4 10*3/uL — CL (ref 3.4–10.8)

## 2016-06-24 LAB — LIPID PANEL
CHOLESTEROL TOTAL: 113 mg/dL (ref 100–199)
Chol/HDL Ratio: 3.6 ratio units (ref 0.0–5.0)
HDL: 31 mg/dL — ABNORMAL LOW (ref >39)
LDL CALC: 62 mg/dL (ref 0–99)
Triglycerides: 98 mg/dL (ref 0–149)
VLDL CHOLESTEROL CAL: 20 mg/dL (ref 5–40)

## 2016-06-24 LAB — BASIC METABOLIC PANEL
BUN / CREAT RATIO: 15 (ref 10–24)
BUN: 16 mg/dL (ref 8–27)
CO2: 23 mmol/L (ref 18–29)
CREATININE: 1.06 mg/dL (ref 0.76–1.27)
Calcium: 8.4 mg/dL — ABNORMAL LOW (ref 8.6–10.2)
Chloride: 105 mmol/L (ref 96–106)
GFR calc Af Amer: 78 mL/min/{1.73_m2} (ref >59)
GFR calc non Af Amer: 68 mL/min/{1.73_m2} (ref >59)
GLUCOSE: 95 mg/dL (ref 65–99)
Potassium: 4.4 mmol/L (ref 3.5–5.2)
Sodium: 143 mmol/L (ref 134–144)

## 2016-06-24 LAB — HEPATIC FUNCTION PANEL
ALBUMIN: 4.5 g/dL (ref 3.5–4.8)
ALK PHOS: 50 IU/L (ref 39–117)
ALT: 14 IU/L (ref 0–44)
AST: 23 IU/L (ref 0–40)
BILIRUBIN, DIRECT: 0.27 mg/dL (ref 0.00–0.40)
Bilirubin Total: 1.4 mg/dL — ABNORMAL HIGH (ref 0.0–1.2)
TOTAL PROTEIN: 6.4 g/dL (ref 6.0–8.5)

## 2016-06-27 ENCOUNTER — Encounter: Payer: Self-pay | Admitting: Family Medicine

## 2016-06-27 ENCOUNTER — Ambulatory Visit (INDEPENDENT_AMBULATORY_CARE_PROVIDER_SITE_OTHER): Payer: Medicare HMO | Admitting: Family Medicine

## 2016-06-27 VITALS — BP 130/70 | Ht 67.0 in | Wt 196.0 lb

## 2016-06-27 DIAGNOSIS — D708 Other neutropenia: Secondary | ICD-10-CM | POA: Diagnosis not present

## 2016-06-27 DIAGNOSIS — E039 Hypothyroidism, unspecified: Secondary | ICD-10-CM

## 2016-06-27 DIAGNOSIS — D649 Anemia, unspecified: Secondary | ICD-10-CM

## 2016-06-27 MED ORDER — LEVOTHYROXINE SODIUM 112 MCG PO TABS: 112.0000 ug | ORAL_TABLET | Freq: Every day | ORAL | 3 refills | Status: DC

## 2016-06-27 NOTE — Progress Notes (Signed)
Subjective:    Patient ID: Omar Lee, male    DOB: [birth year], 76 y.o.   MRN: [id number]  HPI Patient is here today to discuss his recent blood work results. Blood work shows severe anemia. Patient has no other concerns at this time.   Patient arrives office for a protracted discussion regarding his blood work results. Please see prior note. Of note to review over the past years developed progressive fatigue and tiredness.  History of low thyroid. Compliant with medications.  It had a fall with concussion like symptoms several weeks ago. Notes no headache now no confusion no mental lassitude   Day number  Home number    Results for orders placed or performed in visit on 06/22/16  CBC with Differential/Platelet  Result Value Ref Range   WBC 1.4 (LL) 3.4 - 10.8 x10E3/uL   RBC 1.90 (LL) 4.14 - 5.80 x10E6/uL   Hemoglobin 6.7 (LL) 13.0 - 17.7 g/dL   Hematocrit 20.1 (L) 37.5 - 51.0 %   MCV 106 (H) 79 - 97 fL   MCH 35.3 (H) 26.6 - 33.0 pg   MCHC 33.3 31.5 - 35.7 g/dL   RDW 16.4 (H) 12.3 - 15.4 %   Platelets 182 150 - 379 x10E3/uL   Neutrophils 34 Not Estab. %   Lymphs 65 Not Estab. %   Monocytes 1 Not Estab. %   Eos 0 Not Estab. %   Basos 0 Not Estab. %   Neutrophils Absolute 0.5 (L) 1.4 - 7.0 x10E3/uL   Lymphocytes Absolute 0.9 0.7 - 3.1 x10E3/uL   Monocytes Absolute 0.0 (L) 0.1 - 0.9 x10E3/uL   EOS (ABSOLUTE) 0.0 0.0 - 0.4 x10E3/uL   Basophils Absolute 0.0 0.0 - 0.2 x10E3/uL   NRBC 13 (H) 0 - 0 %   Hematology Comments: Note:   Basic metabolic panel  Result Value Ref Range   Glucose 95 65 - 99 mg/dL   BUN 16 8 - 27 mg/dL   Creatinine, Ser 1.06 0.76 - 1.27 mg/dL   GFR calc non Af Amer 68 >59 mL/min/1.73   GFR calc Af Amer 78 >59 mL/min/1.73   BUN/Creatinine Ratio 15 10 - 24   Sodium 143 134 - 144 mmol/L   Potassium 4.4 3.5 - 5.2 mmol/L   Chloride 105 96 - 106 mmol/L   CO2 23 18 - 29 mmol/L   Calcium 8.4 (L) 8.6 - 10.2 mg/dL  Hepatic function panel  Result  Value Ref Range   Total Protein 6.4 6.0 - 8.5 g/dL   Albumin 4.5 3.5 - 4.8 g/dL   Bilirubin Total 1.4 (H) 0.0 - 1.2 mg/dL   Bilirubin, Direct 0.27 0.00 - 0.40 mg/dL   Alkaline Phosphatase 50 39 - 117 IU/L   AST 23 0 - 40 IU/L   ALT 14 0 - 44 IU/L  Lipid panel  Result Value Ref Range   Cholesterol, Total 113 100 - 199 mg/dL   Triglycerides 98 0 - 149 mg/dL   HDL 31 (L) >39 mg/dL   VLDL Cholesterol Cal 20 5 - 40 mg/dL   LDL Calculated 62 0 - 99 mg/dL   Chol/HDL Ratio 3.6 0.0 - 5.0 ratio units  TSH  Result Value Ref Range   TSH 0.418 (L) 0.450 - 4.500 uIU/mL     Review of Systems No headache, no major weight loss or weight gain, no chest pain no back pain abdominal pain no change in bowel habits complete ROS otherwise negative  Objective:   Physical Exam Alert vitals stable, NAD. Blood pressure good on repeat. HEENT normal. Lungs clear. Heart regular rate and rhythm.        Assessment & Plan:  Impression 1 severe anemia patient tolerating relatively well since likely chronic. No history of blood loss. No alcohol abuse. CBC several years ago within normal limits. Also accompanied by very low white blood count with the general lymphocytosis. Overall concerning pattern to say the least. Discussed with patient. Feel we do not need to send him for urgent transfusion. Will go need to press on with hematology oncology evaluation as soon as they can see him #2 hypothyroidism controlled to tight discuss will adjust plan blood work. Hematology referral. Adjustment of dose.  Greater than 50% of this 25 minute face to face visit was spent in counseling and discussion and coordination of care regarding the above diagnosis/diagnosies

## 2016-06-27 NOTE — Patient Instructions (Signed)
Results for orders placed or performed in visit on 06/22/16  CBC with Differential/Platelet  Result Value Ref Range   WBC 1.4 (LL) 3.4 - 10.8 x10E3/uL   RBC 1.90 (LL) 4.14 - 5.80 x10E6/uL   Hemoglobin 6.7 (LL) 13.0 - 17.7 g/dL   Hematocrit 20.1 (L) 37.5 - 51.0 %   MCV 106 (H) 79 - 97 fL   MCH 35.3 (H) 26.6 - 33.0 pg   MCHC 33.3 31.5 - 35.7 g/dL   RDW 16.4 (H) 12.3 - 15.4 %   Platelets 182 150 - 379 x10E3/uL   Neutrophils 34 Not Estab. %   Lymphs 65 Not Estab. %   Monocytes 1 Not Estab. %   Eos 0 Not Estab. %   Basos 0 Not Estab. %   Neutrophils Absolute 0.5 (L) 1.4 - 7.0 x10E3/uL   Lymphocytes Absolute 0.9 0.7 - 3.1 x10E3/uL   Monocytes Absolute 0.0 (L) 0.1 - 0.9 x10E3/uL   EOS (ABSOLUTE) 0.0 0.0 - 0.4 x10E3/uL   Basophils Absolute 0.0 0.0 - 0.2 x10E3/uL   NRBC 13 (H) 0 - 0 %   Hematology Comments: Note:   Basic metabolic panel  Result Value Ref Range   Glucose 95 65 - 99 mg/dL   BUN 16 8 - 27 mg/dL   Creatinine, Ser 1.06 0.76 - 1.27 mg/dL   GFR calc non Af Amer 68 >59 mL/min/1.73   GFR calc Af Amer 78 >59 mL/min/1.73   BUN/Creatinine Ratio 15 10 - 24   Sodium 143 134 - 144 mmol/L   Potassium 4.4 3.5 - 5.2 mmol/L   Chloride 105 96 - 106 mmol/L   CO2 23 18 - 29 mmol/L   Calcium 8.4 (L) 8.6 - 10.2 mg/dL  Hepatic function panel  Result Value Ref Range   Total Protein 6.4 6.0 - 8.5 g/dL   Albumin 4.5 3.5 - 4.8 g/dL   Bilirubin Total 1.4 (H) 0.0 - 1.2 mg/dL   Bilirubin, Direct 0.27 0.00 - 0.40 mg/dL   Alkaline Phosphatase 50 39 - 117 IU/L   AST 23 0 - 40 IU/L   ALT 14 0 - 44 IU/L  Lipid panel  Result Value Ref Range   Cholesterol, Total 113 100 - 199 mg/dL   Triglycerides 98 0 - 149 mg/dL   HDL 31 (L) >39 mg/dL   VLDL Cholesterol Cal 20 5 - 40 mg/dL   LDL Calculated 62 0 - 99 mg/dL   Chol/HDL Ratio 3.6 0.0 - 5.0 ratio units  TSH  Result Value Ref Range   TSH 0.418 (L) 0.450 - 4.500 uIU/mL

## 2016-06-28 LAB — VITAMIN B12: VITAMIN B 12: 741 pg/mL (ref 232–1245)

## 2016-06-28 LAB — FOLATE: Folate: >20 ng/mL (ref >3.0)

## 2016-06-29 NOTE — Addendum Note (Signed)
Addended by: Dairl Ponder on: 06/29/2016 12:38 PM   Modules accepted: Orders

## 2016-07-13 ENCOUNTER — Ambulatory Visit (HOSPITAL_COMMUNITY): Payer: Medicare Other | Admitting: Oncology

## 2016-07-21 ENCOUNTER — Encounter (HOSPITAL_COMMUNITY): Payer: Medicare HMO | Attending: Oncology | Admitting: Oncology

## 2016-07-21 ENCOUNTER — Encounter (HOSPITAL_COMMUNITY): Payer: Self-pay

## 2016-07-21 ENCOUNTER — Encounter (HOSPITAL_COMMUNITY): Payer: Self-pay | Admitting: Adult Health

## 2016-07-21 ENCOUNTER — Encounter (HOSPITAL_COMMUNITY): Payer: Medicare HMO

## 2016-07-21 ENCOUNTER — Encounter (HOSPITAL_BASED_OUTPATIENT_CLINIC_OR_DEPARTMENT_OTHER): Payer: Medicare HMO

## 2016-07-21 ENCOUNTER — Encounter (HOSPITAL_COMMUNITY): Payer: Medicare Other

## 2016-07-21 VITALS — BP 114/48 | HR 75 | Temp 97.5°F | Resp 18 | Ht 67.0 in | Wt 200.0 lb

## 2016-07-21 DIAGNOSIS — N132 Hydronephrosis with renal and ureteral calculous obstruction: Secondary | ICD-10-CM | POA: Insufficient documentation

## 2016-07-21 DIAGNOSIS — D72819 Decreased white blood cell count, unspecified: Secondary | ICD-10-CM | POA: Diagnosis not present

## 2016-07-21 DIAGNOSIS — D649 Anemia, unspecified: Secondary | ICD-10-CM

## 2016-07-21 DIAGNOSIS — Z9889 Other specified postprocedural states: Secondary | ICD-10-CM | POA: Insufficient documentation

## 2016-07-21 DIAGNOSIS — D6489 Other specified anemias: Secondary | ICD-10-CM

## 2016-07-21 DIAGNOSIS — D539 Nutritional anemia, unspecified: Secondary | ICD-10-CM

## 2016-07-21 DIAGNOSIS — D709 Neutropenia, unspecified: Secondary | ICD-10-CM

## 2016-07-21 LAB — CBC WITH DIFFERENTIAL/PLATELET
BLASTS: 0 %
Band Neutrophils: 0 %
Basophils Absolute: 0 10*3/uL (ref 0.0–0.1)
Basophils Relative: 0 %
Eosinophils Absolute: 0 10*3/uL (ref 0.0–0.7)
Eosinophils Relative: 0 %
HEMATOCRIT: 18.5 % — AB (ref 39.0–52.0)
HEMOGLOBIN: 5.9 g/dL — AB (ref 13.0–17.0)
Lymphocytes Relative: 31 %
Lymphs Abs: 0.7 10*3/uL (ref 0.7–4.0)
MCH: 35.5 pg — ABNORMAL HIGH (ref 26.0–34.0)
MCHC: 31.9 g/dL (ref 30.0–36.0)
MCV: 111.4 fL — ABNORMAL HIGH (ref 78.0–100.0)
METAMYELOCYTES PCT: 0 %
MYELOCYTES: 0 %
Monocytes Absolute: 0 10*3/uL — ABNORMAL LOW (ref 0.1–1.0)
Monocytes Relative: 1 %
Neutro Abs: 1.4 10*3/uL — ABNORMAL LOW (ref 1.7–7.7)
Neutrophils Relative %: 68 %
Other: 0 %
PROMYELOCYTES ABS: 0 %
Platelets: 156 10*3/uL (ref 150–400)
RBC: 1.66 MIL/uL — AB (ref 4.22–5.81)
RDW: 18.3 % — AB (ref 11.5–15.5)
WBC: 2.1 10*3/uL — AB (ref 4.0–10.5)
nRBC: 0 /100 WBC

## 2016-07-21 LAB — IRON AND TIBC
Iron: 236 ug/dL — ABNORMAL HIGH (ref 45–182)
Saturation Ratios: 86 % — ABNORMAL HIGH (ref 17.9–39.5)
TIBC: 274 ug/dL (ref 250–450)
UIBC: 38 ug/dL

## 2016-07-21 LAB — COMPREHENSIVE METABOLIC PANEL
ALT: 16 U/L — AB (ref 17–63)
AST: 26 U/L (ref 15–41)
Albumin: 4.3 g/dL (ref 3.5–5.0)
Alkaline Phosphatase: 44 U/L (ref 38–126)
Anion gap: 8 (ref 5–15)
BUN: 20 mg/dL (ref 6–20)
CALCIUM: 9.1 mg/dL (ref 8.9–10.3)
CO2: 26 mmol/L (ref 22–32)
CREATININE: 1 mg/dL (ref 0.61–1.24)
Chloride: 103 mmol/L (ref 101–111)
GFR calc Af Amer: >60 mL/min (ref >60)
Glucose, Bld: 99 mg/dL (ref 65–99)
Potassium: 4.3 mmol/L (ref 3.5–5.1)
Sodium: 137 mmol/L (ref 135–145)
Total Bilirubin: 1.2 mg/dL (ref 0.3–1.2)
Total Protein: 6.7 g/dL (ref 6.5–8.1)

## 2016-07-21 LAB — RETICULOCYTES
RBC.: 1.66 MIL/uL — ABNORMAL LOW (ref 4.22–5.81)
Retic Count, Absolute: 136.1 10*3/uL (ref 19.0–186.0)
Retic Ct Pct: 8.2 % — ABNORMAL HIGH (ref 0.4–3.1)

## 2016-07-21 LAB — ABO/RH: ABO/RH(D): A POS

## 2016-07-21 LAB — LACTATE DEHYDROGENASE: LDH: 218 U/L — ABNORMAL HIGH (ref 98–192)

## 2016-07-21 LAB — PREPARE RBC (CROSSMATCH)

## 2016-07-21 MED ORDER — SODIUM CHLORIDE 0.9 % IV SOLN
250.0000 mL | Freq: Once | INTRAVENOUS | Status: AC
  Administered 2016-07-21: 250 mL via INTRAVENOUS

## 2016-07-21 MED ORDER — DIPHENHYDRAMINE HCL 25 MG PO TABS
25.0000 mg | ORAL_TABLET | Freq: Once | ORAL | Status: AC
  Administered 2016-07-21: 25 mg via ORAL

## 2016-07-21 MED ORDER — DIPHENHYDRAMINE HCL 25 MG PO CAPS
ORAL_CAPSULE | ORAL | Status: AC
  Filled 2016-07-21: qty 1

## 2016-07-21 MED ORDER — ACETAMINOPHEN 325 MG PO TABS
650.0000 mg | ORAL_TABLET | Freq: Once | ORAL | Status: AC
  Administered 2016-07-21: 650 mg via ORAL

## 2016-07-21 MED ORDER — ACETAMINOPHEN 325 MG PO TABS
ORAL_TABLET | ORAL | Status: AC
  Filled 2016-07-21: qty 2

## 2016-07-21 MED ORDER — SODIUM CHLORIDE 0.9% FLUSH: 10.0000 mL | INTRAVENOUS | Status: DC | PRN

## 2016-07-21 MED ORDER — FUROSEMIDE 10 MG/ML IJ SOLN
20.0000 mg | Freq: Once | INTRAMUSCULAR | Status: AC
  Administered 2016-07-21: 20 mg via INTRAVENOUS
  Filled 2016-07-21: qty 2

## 2016-07-21 NOTE — Progress Notes (Signed)
Dr. Oliva Bustard saw new patient, Mr. Omar Lee today for evaluation of significant anemia and neutropenia.  Dr. Oliva Bustard requesting bone marrow biopsy to be done next week.    Unable to accommodate patient in our clinic d/t scheduling issues.  Orders for CT biopsy placed for patient to go to [facility] for bone marrow biopsy. Received Google from Angelina Ok with radiology at [facility]:     We will try to get patient in to our clinic for bone marrow biopsy at our next available appointment at the cancer center (which may be >1 week from now).     [name], NP [place] [phone]

## 2016-07-21 NOTE — Progress Notes (Signed)
Omar Lee tolerated blood transfusion with Lasix given between each unit well without complaints or incident. VSS throughout transfusion and upon discharge.Reviewed symptoms of transfusion reaction with the pt and instructed him to call if any of them occur over the weekend. Pt verbalized understanding. Pt discharged self ambulatory in satisfactory condition

## 2016-07-21 NOTE — Addendum Note (Signed)
Addended by: Holley Bouche on: 07/21/2016 03:30 PM   Modules accepted: Orders

## 2016-07-21 NOTE — Addendum Note (Signed)
Addended by: Berneta Levins on: 07/21/2016 10:28 AM   Modules accepted: Orders, SmartSet

## 2016-07-21 NOTE — Progress Notes (Signed)
[facility]  CONSULT NOTE  Patient Care Team: [name], MD as PCP - General (Family Medicine)  CHIEF COMPLAINTS/PURPOSE OF CONSULTATION:  Anemia  HISTORY OF PRESENTING ILLNESS:  Omar Lee 76 y.o. male is here because of severe anemia, noticed by his PCP. Hgb was 6.7 on 06/23/2016. He has not had a blood transfusion in the past.   He is doing okay today. He has not had a blood transfusion or gone to the hospital. He has been taking some iron pills daily. Pt feels very weak and very tired after simple activities such as carrying the groceries in the house. He does not smoke or drink alcohol. He has never had a colonoscopy. Denies any other medical problems. Denies blood in stool, chest pain, or any other concerns. Pt lives alone.  Patient had hemoglobin of 6.7, and has not received any blood transfusions except for the iron tablets.  Extremely weak and tired MEDICAL HISTORY:  Past Medical History:  Diagnosis Date  . Hyperlipidemia   . Hypothyroidism   . Reflux   . Sleep apnea     SURGICAL HISTORY: Past Surgical History:  Procedure Laterality Date  . ESOPHAGOGASTRODUODENOSCOPY (EGD) WITH ESOPHAGEAL DILATION N/A 11/22/2012   Procedure: ESOPHAGOGASTRODUODENOSCOPY (EGD) WITH ESOPHAGEAL DILATION;  Surgeon: Rogene Houston, MD;  Location: [facility] SUITE;  Service: Endoscopy;  Laterality: N/A;  1120  . HEMORRHOID SURGERY    . NASAL SEPTUM SURGERY    . SHOULDER SURGERY Right   . TONSILLECTOMY AND ADENOIDECTOMY      SOCIAL HISTORY: Social History   Social History  . Marital status: Legally Separated    Spouse name: N/A  . Number of children: N/A  . Years of education: N/A   Occupational History  . Not on file.   Social History Main Topics  . Smoking status: Never Smoker  . Smokeless tobacco: Never Used  . Alcohol use No  . Drug use: No  . Sexual activity: Not Currently    Birth control/ protection: None   Other Topics Concern  . Not on file    Social History Narrative  . No narrative on file  Never smoker Never drinker Lives alone  FAMILY HISTORY: Family History  Problem Relation Age of Onset  . Hypertension Mother   . Heart attack Mother   . Heart attack Father   . Diabetes Brother     ALLERGIES:  has No Known Allergies.  MEDICATIONS:  Current Outpatient Prescriptions  Medication Sig Dispense Refill  . levothyroxine (SYNTHROID, LEVOTHROID) 112 MCG tablet Take 1 tablet (112 mcg total) by mouth daily. 90 tablet 3   No current facility-administered medications for this visit.    Review of Systems  Constitutional: Positive for malaise/fatigue.  HENT: Negative.   Eyes: Negative.   Respiratory: Negative.   Cardiovascular: Negative.  Negative for chest pain.  Gastrointestinal: Negative.  Negative for blood in stool.  Genitourinary: Negative.   Musculoskeletal: Negative.   Skin: Negative.   Neurological: Positive for weakness.  Endo/Heme/Allergies: Negative.   Psychiatric/Behavioral: Negative.   All other systems reviewed and are negative. 14 point ROS was done and is otherwise as detailed above or in HPI  PHYSICAL EXAMINATION: ECOG PERFORMANCE STATUS: 1 - Symptomatic but completely ambulatory  Vitals:   07/21/16 0847  BP: (!) 142/51  Pulse: 95  Resp: 16   Filed Weights   07/21/16 0847  Weight: 200 lb (90.7 kg)   Physical Exam  Constitutional: He is oriented to  person, place, and time and well-developed, well-nourished, and in no distress.  HENT:  Head: Normocephalic and atraumatic.  Mouth/Throat: Oropharynx is clear and moist.  Eyes: Conjunctivae and EOM are normal. Pupils are equal, round, and reactive to light.  Neck: Normal range of motion. Neck supple.  Cardiovascular: Normal rate, regular rhythm and normal heart sounds.   Pulmonary/Chest: Effort normal and breath sounds normal.  Abdominal: Soft. Bowel sounds are normal.  Musculoskeletal: Normal range of motion.  Neurological: He is alert  and oriented to person, place, and time. Gait normal.  Skin: Skin is warm and dry.  Nursing note and vitals reviewed.  LABORATORY DATA:  I have reviewed the data as listed Lab Results  Component Value Date   WBC 1.4 (LL) 06/23/2016   HGB 13.5 11/20/2012   HCT 20.1 (L) 06/23/2016   MCV 106 (H) 06/23/2016   PLT 182 06/23/2016   CMP     Component Value Date/Time   NA 143 06/23/2016 0825   K 4.4 06/23/2016 0825   CL 105 06/23/2016 0825   CO2 23 06/23/2016 0825   GLUCOSE 95 06/23/2016 0825   GLUCOSE 115 (H) 11/20/2012 1116   BUN 16 06/23/2016 0825   CREATININE 1.06 06/23/2016 0825   CALCIUM 8.4 (L) 06/23/2016 0825   PROT 6.4 06/23/2016 0825   ALBUMIN 4.5 06/23/2016 0825   AST 23 06/23/2016 0825   ALT 14 06/23/2016 0825   ALKPHOS 50 06/23/2016 0825   BILITOT 1.4 (H) 06/23/2016 0825   GFRNONAA 68 06/23/2016 0825   GFRAA 78 06/23/2016 0825    ASSESSMENT & PLAN:  Cancer Staging No matching staging information was found for the patient. No problem-specific Assessment & Plan notes found for this encounter. 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  Recheck CBC today. Order Path review, Iron studies, retic count, lactate dehydrogenase, SPEP, and blood bank sample. Order Abdominal US.   May order bone marrow biopsy, depending on results of blood work today.   He will return for follow up in. Will check if Hgb has improved from previously recorded 6.7 on 06/23/2016. If not, he may require a blood transfusion Patient's hbg is 5.9 wbc 2.1   Will transfuse 2 units of packed cells as patient is very symptomatic   Proceed with bone marrow examination   ORDERS PLACED FOR THIS ENCOUNTER: Repeat   CBC  MEDICATIONS PRESCRIBED THIS ENCOUNTER: No orders of the defined types were placed in this encounter.  All questions were answered. The patient knows to call the clinic with any problems, questions or concerns.  This document serves as a record of services personally performed by Forest Gleason, MD. It was created on her behalf by Martinique Casey, a trained medical scribe. The creation of this record is based on the scribe's personal observations and the provider's statements to them. This document has been checked and approved by the attending provider.  I have reviewed the above documentation for accuracy and completeness and I agree with the above.  This note was electronically signed.    Martinique M Casey  07/21/2016 8:51 AM

## 2016-07-21 NOTE — Progress Notes (Signed)
CRITICAL VALUE ALERT Critical value received:  Hemoglobin-5.9 Date of notification:  07/21/16 Time of notification: 0953 Critical value read back:  Yes.   Nurse who received alert:  M.Theoren Palka, LPN  MD notified (1st page):  J.Choksi, MD

## 2016-07-21 NOTE — Patient Instructions (Addendum)
[place] at [facility] Discharge Instructions  RECOMMENDATIONS MADE BY THE CONSULTANT AND ANY TEST RESULTS WILL BE SENT TO YOUR REFERRING PHYSICIAN.  You were seen today by Dr. Oliva Bustard. Blood today. [place] of abdomen next week. Return to clinic next week for follow up.   Thank you for choosing [place] at [facility] to provide your oncology and hematology care.  To afford each patient quality time with our provider, please arrive at least 15 minutes before your scheduled appointment time.    If you have a lab appointment with the [facility] please come in thru the  Main Entrance and check in at the main information desk  You need to re-schedule your appointment should you arrive 10 or more minutes late.  We strive to give you quality time with our providers, and arriving late affects you and other patients whose appointments are after yours.  Also, if you no show three or more times for appointments you may be dismissed from the clinic at the providers discretion.     Again, thank you for choosing [facility].  Our hope is that these requests will decrease the amount of time that you wait before being seen by our physicians.       _____________________________________________________________  Should you have questions after your visit to [facility], please contact our office at [phone] between the hours of 8:30 a.m. and 4:30 p.m.  Voicemails left after 4:30 p.m. will not be returned until the following business day.  For prescription refill requests, have your pharmacy contact our office.       Resources For Cancer Patients and their Caregivers ? American Cancer Society: Can assist with transportation, wigs, general needs, runs Look Good Feel Better.        (931) 437-2807 ? Cancer Care: Provides financial assistance, online support groups, medication/co-pay assistance.  1-800-813-HOPE (479) 210-5980) ? [place] Assists [place] cancer patients and their families through emotional , educational and financial support.  959-715-9641 ? [place] DSS Where to apply for food stamps, Medicaid and utility assistance. 864-632-5144 ? RCATS: Transportation to medical appointments. 709-134-5146 ? Social Security Administration: May apply for disability if have a Stage IV cancer. 201-157-2278 (551) 379-8642 ? [employer], Disability and Transit Services: Assists with nutrition, care and transit needs. [place] Support Programs: @10RELATIVEDAYS @ > Cancer Support Group  2nd Tuesday of the month 1pm-2pm, Journey Room  > Creative Journey  3rd Tuesday of the month 1130am-1pm, Journey Room  > Look Good Feel Better  1st Wednesday of the month 10am-12 noon, Journey Room (Call [place] to register [phone])

## 2016-07-21 NOTE — Patient Instructions (Signed)
[facility] at [facility] Discharge Instructions  RECOMMENDATIONS MADE BY THE CONSULTANT AND ANY TEST RESULTS WILL BE SENT TO YOUR REFERRING PHYSICIAN.  Received 2 units of blood today. Follow-up as scheduled. Call clinic for any questions or concerns  Thank you for choosing [facility] at [facility] to provide your oncology and hematology care.  To afford each patient quality time with our provider, please arrive at least 15 minutes before your scheduled appointment time.    If you have a lab appointment with the Cancer Center please come in thru the  Main Entrance and check in at the main information desk  You need to re-schedule your appointment should you arrive 10 or more minutes late.  We strive to give you quality time with our providers, and arriving late affects you and other patients whose appointments are after yours.  Also, if you no show three or more times for appointments you may be dismissed from the clinic at the providers discretion.     Again, thank you for choosing [facility].  Our hope is that these requests will decrease the amount of time that you wait before being seen by our physicians.       _____________________________________________________________  Should you have questions after your visit to [facility], please contact our office at [phone] between the hours of 8:30 a.m. and 4:30 p.m.  Voicemails left after 4:30 p.m. will not be returned until the following business day.  For prescription refill requests, have your pharmacy contact our office.       Resources For Cancer Patients and their Caregivers ? American Cancer Society: Can assist with transportation, wigs, general needs, runs Look Good Feel Better.        1-888-227-6333 ? Cancer Care: Provides financial assistance, online support groups, medication/co-pay assistance.  1-800-813-HOPE (4673) ? [facility] Assists [place] cancer patients and their families through emotional , educational and financial support.  336-427-4357 ? [place] DSS Where to apply for food stamps, Medicaid and utility assistance. 336-342-1394 ? RCATS: Transportation to medical appointments. 336-347-2287 ? Social Security Administration: May apply for disability if have a Stage IV cancer. 336-342-7796 1-800-772-1213 ? [place] Aging, Disability and Transit Services: Assists with nutrition, care and transit needs. 336-349-2343  Cancer Center Support Programs: @10RELATIVEDAYS@ > Cancer Support Group  2nd Tuesday of the month 1pm-2pm, Journey Room  > Creative Journey  3rd Tuesday of the month 1130am-1pm, Journey Room  > Look Good Feel Better  1st Wednesday of the month 10am-12 noon, Journey Room (Call American Cancer Society to register 1-800-395-5775)   

## 2016-07-22 LAB — TYPE AND SCREEN
ABO/RH(D): A POS
Antibody Screen: NEGATIVE
UNIT DIVISION: 0
Unit division: 0

## 2016-07-22 LAB — BPAM RBC
BLOOD PRODUCT EXPIRATION DATE: 201804272359
Blood Product Expiration Date: 201804272359
ISSUE DATE / TIME: 201804061340
ISSUE DATE / TIME: 201804061125
UNIT TYPE AND RH: 6200
Unit Type and Rh: 6200

## 2016-07-24 LAB — PROTEIN ELECTROPHORESIS, SERUM
A/G Ratio: 1.9 — ABNORMAL HIGH (ref 0.7–1.7)
ALBUMIN ELP: 4 g/dL (ref 2.9–4.4)
ALPHA-1-GLOBULIN: 0.2 g/dL (ref 0.0–0.4)
Alpha-2-Globulin: 0.4 g/dL (ref 0.4–1.0)
Beta Globulin: 0.8 g/dL (ref 0.7–1.3)
GLOBULIN, TOTAL: 2.1 g/dL — AB (ref 2.2–3.9)
Gamma Globulin: 0.7 g/dL (ref 0.4–1.8)
TOTAL PROTEIN ELP: 6.1 g/dL (ref 6.0–8.5)

## 2016-07-24 LAB — PATHOLOGIST SMEAR REVIEW

## 2016-07-25 ENCOUNTER — Ambulatory Visit (HOSPITAL_COMMUNITY)
Admission: RE | Admit: 2016-07-25 | Discharge: 2016-07-25 | Disposition: A | Discharge status: Home / Self Care | Payer: Medicare HMO | Source: Ambulatory Visit | Attending: Oncology | Admitting: Oncology

## 2016-07-25 DIAGNOSIS — N1330 Unspecified hydronephrosis: Secondary | ICD-10-CM | POA: Insufficient documentation

## 2016-07-25 DIAGNOSIS — D6489 Other specified anemias: Secondary | ICD-10-CM | POA: Insufficient documentation

## 2016-07-26 ENCOUNTER — Encounter (HOSPITAL_BASED_OUTPATIENT_CLINIC_OR_DEPARTMENT_OTHER): Payer: Medicare HMO | Admitting: Oncology

## 2016-07-26 ENCOUNTER — Ambulatory Visit (HOSPITAL_COMMUNITY): Payer: Medicare Other

## 2016-07-26 ENCOUNTER — Encounter (HOSPITAL_COMMUNITY): Payer: Medicare HMO

## 2016-07-26 ENCOUNTER — Telehealth (HOSPITAL_COMMUNITY): Payer: Self-pay | Admitting: Oncology

## 2016-07-26 ENCOUNTER — Encounter (HOSPITAL_COMMUNITY): Payer: Self-pay

## 2016-07-26 VITALS — BP 147/53 | HR 68 | Temp 98.4°F | Resp 18 | Wt 199.0 lb

## 2016-07-26 DIAGNOSIS — N1330 Unspecified hydronephrosis: Secondary | ICD-10-CM | POA: Diagnosis not present

## 2016-07-26 DIAGNOSIS — N132 Hydronephrosis with renal and ureteral calculous obstruction: Secondary | ICD-10-CM | POA: Diagnosis not present

## 2016-07-26 DIAGNOSIS — D72819 Decreased white blood cell count, unspecified: Secondary | ICD-10-CM | POA: Diagnosis not present

## 2016-07-26 DIAGNOSIS — Z9889 Other specified postprocedural states: Secondary | ICD-10-CM | POA: Diagnosis not present

## 2016-07-26 DIAGNOSIS — D539 Nutritional anemia, unspecified: Secondary | ICD-10-CM | POA: Diagnosis not present

## 2016-07-26 DIAGNOSIS — D649 Anemia, unspecified: Secondary | ICD-10-CM

## 2016-07-26 LAB — COMPREHENSIVE METABOLIC PANEL
ALBUMIN: 4.3 g/dL (ref 3.5–5.0)
ALT: 18 U/L (ref 17–63)
AST: 24 U/L (ref 15–41)
Alkaline Phosphatase: 47 U/L (ref 38–126)
Anion gap: 7 (ref 5–15)
BILIRUBIN TOTAL: 1.4 mg/dL — AB (ref 0.3–1.2)
BUN: 18 mg/dL (ref 6–20)
CO2: 24 mmol/L (ref 22–32)
CREATININE: 0.92 mg/dL (ref 0.61–1.24)
Calcium: 8.7 mg/dL — ABNORMAL LOW (ref 8.9–10.3)
Chloride: 103 mmol/L (ref 101–111)
GFR calc Af Amer: >60 mL/min (ref >60)
GFR calc non Af Amer: >60 mL/min (ref >60)
GLUCOSE: 98 mg/dL (ref 65–99)
POTASSIUM: 4.4 mmol/L (ref 3.5–5.1)
Sodium: 134 mmol/L — ABNORMAL LOW (ref 135–145)
TOTAL PROTEIN: 6.7 g/dL (ref 6.5–8.1)

## 2016-07-26 LAB — DIRECT ANTIGLOBULIN TEST (NOT AT ARMC)
DAT, COMPLEMENT: NEGATIVE
DAT, IGG: NEGATIVE

## 2016-07-26 LAB — CBC WITH DIFFERENTIAL/PLATELET
BASOS ABS: 0 10*3/uL (ref 0.0–0.1)
Basophils Relative: 0 %
Eosinophils Absolute: 0 10*3/uL (ref 0.0–0.7)
Eosinophils Relative: 0 %
HEMATOCRIT: 24.4 % — AB (ref 39.0–52.0)
HEMOGLOBIN: 8 g/dL — AB (ref 13.0–17.0)
LYMPHS ABS: 0.7 10*3/uL (ref 0.7–4.0)
LYMPHS PCT: 43 %
MCH: 32.7 pg (ref 26.0–34.0)
MCHC: 32.8 g/dL (ref 30.0–36.0)
MCV: 99.6 fL (ref 78.0–100.0)
Monocytes Absolute: 0 10*3/uL — ABNORMAL LOW (ref 0.1–1.0)
Monocytes Relative: 2 %
NEUTROS ABS: 1 10*3/uL — AB (ref 1.7–7.7)
NEUTROS PCT: 56 %
Platelets: 130 10*3/uL — ABNORMAL LOW (ref 150–400)
RBC: 2.45 MIL/uL — AB (ref 4.22–5.81)
RDW: 22.5 % — AB (ref 11.5–15.5)
WBC: 1.7 10*3/uL — ABNORMAL LOW (ref 4.0–10.5)

## 2016-07-26 LAB — RETICULOCYTES
RBC.: 2.45 MIL/uL — ABNORMAL LOW (ref 4.22–5.81)
Retic Count, Absolute: 117.6 10*3/uL (ref 19.0–186.0)
Retic Ct Pct: 4.8 % — ABNORMAL HIGH (ref 0.4–3.1)

## 2016-07-26 NOTE — Progress Notes (Signed)
[place]  PROGRESS NOTE  Patient Care Team: [name], MD as PCP - General (Family Medicine)  CHIEF COMPLAINTS/PURPOSE OF CONSULTATION:  Anemia  HISTORY OF PRESENTING ILLNESS:  Omar Lee 76 y.o. male is here for follow up of severe anemia, noticed by his PCP. Hgb on 07/21/2016 was 5.9; he received 2 units of blood.   After his transfusion, he felt a very mild difference in his fatigue, but not much change. After exertion he has some nausea and fatigue. He has an appointment scheduled for this Friday for a bone marrow biopsy. Denies any urinary problems, or any other concerns.   MEDICAL HISTORY:  Past Medical History:  Diagnosis Date  . Hyperlipidemia   . Hypothyroidism   . Reflux   . Sleep apnea     SURGICAL HISTORY: Past Surgical History:  Procedure Laterality Date  . ESOPHAGOGASTRODUODENOSCOPY (EGD) WITH ESOPHAGEAL DILATION N/A 11/22/2012   Procedure: ESOPHAGOGASTRODUODENOSCOPY (EGD) WITH ESOPHAGEAL DILATION;  Surgeon: Rogene Houston, MD;  [facility];  Service: Endoscopy;  Laterality: N/A;  1120  . HEMORRHOID SURGERY    . NASAL SEPTUM SURGERY    . SHOULDER SURGERY Right   . TONSILLECTOMY AND ADENOIDECTOMY      SOCIAL HISTORY: Social History   Social History  . Marital status: Legally Separated    Spouse name: N/A  . Number of children: N/A  . Years of education: N/A   Occupational History  . Not on file.   Social History Main Topics  . Smoking status: Never Smoker  . Smokeless tobacco: Never Used  . Alcohol use No  . Drug use: No  . Sexual activity: Not Currently    Birth control/ protection: None   Other Topics Concern  . Not on file   Social History Narrative  . No narrative on file  Never smoker Never drinker Lives alone  FAMILY HISTORY: Family History  Problem Relation Age of Onset  . Hypertension Mother   . Heart attack Mother   . Heart attack Father   . Diabetes Brother     ALLERGIES:  has No  Known Allergies.  MEDICATIONS:  Current Outpatient Prescriptions  Medication Sig Dispense Refill  . levothyroxine (SYNTHROID, LEVOTHROID) 112 MCG tablet Take 1 tablet (112 mcg total) by mouth daily. 90 tablet 3   No current facility-administered medications for this visit.    Facility-Administered Medications Ordered in Other Visits  Medication Dose Route Frequency Provider Last Rate Last Dose  . sodium chloride flush (NS) 0.9 % injection 10 mL  10 mL Intracatheter PRN Holley Bouche, NP       Review of Systems  Constitutional: Positive for malaise/fatigue.  HENT: Negative.   Eyes: Negative.   Respiratory: Negative.   Cardiovascular: Negative.   Gastrointestinal: Negative for nausea.  Genitourinary: Negative.  Negative for dysuria, frequency, hematuria and urgency.  Musculoskeletal: Negative.   Skin: Negative.   Neurological: Negative.   Endo/Heme/Allergies: Negative.   Psychiatric/Behavioral: Negative.   All other systems reviewed and are negative. 14 point ROS was done and is otherwise as detailed above or in HPI  PHYSICAL EXAMINATION: ECOG PERFORMANCE STATUS: 1 - Symptomatic but completely ambulatory Vitals:   07/26/16 1005  BP: (!) 147/53  Pulse: 68  Resp: 18  Temp: 98.4 F (36.9 C)    Physical Exam  Constitutional: He is oriented to person, place, and time and well-developed, well-nourished, and in no distress.  HENT:  Head: Normocephalic and atraumatic.  Mouth/Throat:  Oropharynx is clear and moist.  Eyes: Conjunctivae and EOM are normal. Pupils are equal, round, and reactive to light.  Neck: Normal range of motion. Neck supple.  Cardiovascular: Normal rate, regular rhythm and normal heart sounds.   Pulmonary/Chest: Effort normal and breath sounds normal.  Abdominal: Soft. Bowel sounds are normal.  Abdominal hernia -reducible, nontender  Musculoskeletal: Normal range of motion.  Neurological: He is alert and oriented to person, place, and time. Gait normal.   Skin: Skin is warm and dry.  Nursing note and vitals reviewed.  LABORATORY DATA:  I have reviewed the data as listed Lab Results  Component Value Date   WBC 2.1 (L) 07/21/2016   HGB 5.9 (LL) 07/21/2016   HCT 18.5 (L) 07/21/2016   MCV 111.4 (H) 07/21/2016   PLT 156 07/21/2016   CMP     Component Value Date/Time   NA 137 07/21/2016 0926   NA 143 06/23/2016 0825   K 4.3 07/21/2016 0926   CL 103 07/21/2016 0926   CO2 26 07/21/2016 0926   GLUCOSE 99 07/21/2016 0926   BUN 20 07/21/2016 0926   BUN 16 06/23/2016 0825   CREATININE 1.00 07/21/2016 0926   CALCIUM 9.1 07/21/2016 0926   PROT 6.7 07/21/2016 0926   PROT 6.4 06/23/2016 0825   ALBUMIN 4.3 07/21/2016 0926   ALBUMIN 4.5 06/23/2016 0825   AST 26 07/21/2016 0926   ALT 16 (L) 07/21/2016 0926   ALKPHOS 44 07/21/2016 0926   BILITOT 1.2 07/21/2016 0926   BILITOT 1.4 (H) 06/23/2016 0825   GFRNONAA >60 07/21/2016 0926   GFRAA >60 07/21/2016 8032   PATHOLOGY:   RADIOLOGY: I have reviewed the images below and agree with the reported results  US Abdomen 07/25/2016 IMPRESSION: BILATERAL mild to moderate hydronephrosis of uncertain etiology.  Further evaluation by CT imaging with and without contrast recommended.  Small gallbladder polyps largest 8 mm diameter ; followup ultrasound recommended in 1 year to assess stability in order to exclude malignancy.  ASSESSMENT & PLAN:  Anemia  Macrocytic anemia Neutropenia Normal vitamin B12 and folate acid level Bilateral mild to mod hydronephrosis  Suspect patient may have underlying MDS as a cause of his macrocytic anemia. He will be going for a bone marrow biopsy and aspirate on 07/28/16 Symptomatically improved after receiving 2 units of pRBC. Recheck CBC today. If hgb is still <7-8, will give him another pRBC transfusion. Reviewed abdominal US with the patient in detail. No evidence of splenomegaly however he does have mild-mod bilateral hydronephrosis. He denies any  urinary symptoms. Will order a CT abd/pelvis with contrast for further evaluation. May need urology referral. He will return for follow up in 2 weeks to review the biopsy results.   ORDERS PLACED FOR THIS ENCOUNTER: Orders Placed This Encounter  Procedures  . CT Abdomen Pelvis W Contrast    Standing Status:   Future    Standing Expiration Date:   07/26/2017    Order Specific Question:   If indicated for the ordered procedure, I authorize the administration of contrast media per Radiology protocol    Answer:   Yes    Order Specific Question:   Reason for Exam (SYMPTOM  OR DIAGNOSIS REQUIRED)    Answer:   evaluate for bilateral hydronephrosis on abd [place]    Order Specific Question:   Preferred imaging location?    Answer:   [facility]    Order Specific Question:   Radiology Contrast Protocol - do NOT remove file path  Answer:   [email or web address]  . CBC with Differential    Standing Status:   Future    Number of Occurrences:   1    Standing Expiration Date:   07/26/2017  . Comprehensive metabolic panel    Standing Status:   Future    Number of Occurrences:   1    Standing Expiration Date:   07/26/2017  . Reticulocytes    Standing Status:   Future    Number of Occurrences:   1    Standing Expiration Date:   07/26/2017  . Erythropoietin    Standing Status:   Future    Number of Occurrences:   1    Standing Expiration Date:   07/26/2017  . Hemoglobinopathy evaluation    Standing Status:   Future    Number of Occurrences:   1    Standing Expiration Date:   07/26/2017  . Direct antiglobulin test    Standing Status:   Future    Number of Occurrences:   1    Standing Expiration Date:   07/26/2017    All questions were answered. The patient knows to call the clinic with any problems, questions or concerns.  This document serves as a record of services personally performed by Twana First, MD. It was created on her behalf by Martinique Casey, a trained medical  scribe. The creation of this record is based on the scribe's personal observations and the provider's statements to them. This document has been checked and approved by the attending provider.  I have reviewed the above documentation for accuracy and completeness and I agree with the above.  This note was electronically signed.    Martinique M Casey  07/26/2016 10:04 AM

## 2016-07-26 NOTE — Telephone Encounter (Signed)
Spoke with Omar Lee today who is his long time friend of over 1 years and his neighbor who takes care of the patient. Patient has some senility and has difficulty comprehending what was told to him today during his office visit.  I explained that the abd US revealed bilateral hydronephrosis which needs further evaluation with a CT abd/pelvis and possibly a urology referral for treatment of the hydroneophrosis depending on the severity. I also reviewed his CBC today with her. His hemoglobin is now 8 g/dL so he does not need anymore pRBC transfusions at this time. However explained that it is very important that he goes for his bone marrow biopsy as scheduled since I suspect he has an underlying bone marrow disorder such as MDS. She states that she will be taking him. Patient is in the process of getting a POA, which I explained was a great idea. All her questions were answered to her satisfaction.

## 2016-07-26 NOTE — Patient Instructions (Addendum)
[place] at [facility] Discharge Instructions  RECOMMENDATIONS MADE BY THE CONSULTANT AND ANY TEST RESULTS WILL BE SENT TO YOUR REFERRING PHYSICIAN. You were seen today by Dr. Twana First Lab work today Bone marrow biopsy this week CT scan next week Follow up in 2 weeks See Amy up front for appointments   Thank you for choosing [place] at [facility] to provide your oncology and hematology care.  To afford each patient quality time with our provider, please arrive at least 15 minutes before your scheduled appointment time.    If you have a lab appointment with the [place] please come in thru the  Main Entrance and check in at the main information desk  You need to re-schedule your appointment should you arrive 10 or more minutes late.  We strive to give you quality time with our providers, and arriving late affects you and other patients whose appointments are after yours.  Also, if you no show three or more times for appointments you may be dismissed from the clinic at the providers discretion.     Again, thank you for choosing [facility].  Our hope is that these requests will decrease the amount of time that you wait before being seen by our physicians.       _____________________________________________________________  Should you have questions after your visit to [facility], please contact our office at [phone] between the hours of 8:30 a.m. and 4:30 p.m.  Voicemails left after 4:30 p.m. will not be returned until the following business day.  For prescription refill requests, have your pharmacy contact our office.       Resources For Cancer Patients and their Caregivers ? American Cancer Society: Can assist with transportation, wigs, general needs, runs Look Good Feel Better.        (825)496-8116 ? Cancer Care: Provides financial assistance, online support groups, medication/co-pay  assistance.  1-800-813-HOPE 9598591760) ? [place] Assists [place] cancer patients and their families through emotional , educational and financial support.  605-670-8293 ? [place] DSS Where to apply for food stamps, Medicaid and utility assistance. 640-160-2593 ? [employer]: Transportation to medical appointments. (713) 396-8768 ? Social Security Administration: May apply for disability if have a Stage IV cancer. 567-727-9172 229-126-5928 ? [employer], Disability and Transit Services: Assists with nutrition, care and transit needs. [place] Support Programs: @10RELATIVEDAYS @ > Cancer Support Group  2nd Tuesday of the month 1pm-2pm, Journey Room  > Creative Journey  3rd Tuesday of the month 1130am-1pm, Journey Room  > Look Good Feel Better  1st Wednesday of the month 10am-12 noon, Journey Room (Call [place] to register [phone])

## 2016-07-27 ENCOUNTER — Other Ambulatory Visit: Payer: Self-pay | Admitting: Physician Assistant

## 2016-07-27 LAB — HEMOGLOBINOPATHY EVALUATION
HGB F QUANT: 0 % (ref 0.0–2.0)
HGB VARIANT: 0 %
Hgb A2 Quant: 2.1 % (ref 1.8–3.2)
Hgb A: 97.9 % (ref 96.4–98.8)
Hgb C: 0 %
Hgb S Quant: 0 %

## 2016-07-27 LAB — ERYTHROPOIETIN: Erythropoietin: 72.6 m[IU]/mL — ABNORMAL HIGH (ref 2.6–18.5)

## 2016-07-28 ENCOUNTER — Encounter (HOSPITAL_COMMUNITY): Payer: Self-pay

## 2016-07-28 ENCOUNTER — Ambulatory Visit (HOSPITAL_COMMUNITY)
Admission: RE | Admit: 2016-07-28 | Discharge: 2016-07-28 | Disposition: A | Discharge status: Home / Self Care | Payer: Medicare HMO | Source: Ambulatory Visit | Attending: Adult Health | Admitting: Adult Health

## 2016-07-28 DIAGNOSIS — D61818 Other pancytopenia: Secondary | ICD-10-CM | POA: Diagnosis not present

## 2016-07-28 DIAGNOSIS — D649 Anemia, unspecified: Secondary | ICD-10-CM | POA: Diagnosis not present

## 2016-07-28 DIAGNOSIS — D709 Neutropenia, unspecified: Secondary | ICD-10-CM | POA: Diagnosis present

## 2016-07-28 DIAGNOSIS — D4621 Refractory anemia with excess of blasts 1: Secondary | ICD-10-CM | POA: Insufficient documentation

## 2016-07-28 DIAGNOSIS — D6489 Other specified anemias: Secondary | ICD-10-CM

## 2016-07-28 LAB — APTT: APTT: 30 s (ref 24–36)

## 2016-07-28 LAB — CBC
HCT: 24.8 % — ABNORMAL LOW (ref 39.0–52.0)
Hemoglobin: 8.1 g/dL — ABNORMAL LOW (ref 13.0–17.0)
MCH: 32.7 pg (ref 26.0–34.0)
MCHC: 32.7 g/dL (ref 30.0–36.0)
MCV: 100 fL (ref 78.0–100.0)
PLATELETS: 142 10*3/uL — AB (ref 150–400)
RBC: 2.48 MIL/uL — ABNORMAL LOW (ref 4.22–5.81)
RDW: 22 % — ABNORMAL HIGH (ref 11.5–15.5)
WBC: 1.6 10*3/uL — AB (ref 4.0–10.5)

## 2016-07-28 LAB — PROTIME-INR
INR: 1.13
PROTHROMBIN TIME: 14.6 s (ref 11.4–15.2)

## 2016-07-28 MED ORDER — MIDAZOLAM HCL 2 MG/2ML IJ SOLN
INTRAMUSCULAR | Status: AC | PRN
  Administered 2016-07-28 (×3): 1 mg via INTRAVENOUS

## 2016-07-28 MED ORDER — MIDAZOLAM HCL 2 MG/2ML IJ SOLN
INTRAMUSCULAR | Status: AC
  Filled 2016-07-28: qty 6

## 2016-07-28 MED ORDER — FENTANYL CITRATE (PF) 100 MCG/2ML IJ SOLN
INTRAMUSCULAR | Status: AC
  Filled 2016-07-28: qty 6

## 2016-07-28 MED ORDER — FLUMAZENIL 0.5 MG/5ML IV SOLN
INTRAVENOUS | Status: AC
  Filled 2016-07-28: qty 5

## 2016-07-28 MED ORDER — NALOXONE HCL 0.4 MG/ML IJ SOLN
INTRAMUSCULAR | Status: DC
Start: 2016-07-28 — End: 2016-07-28
  Filled 2016-07-28: qty 1

## 2016-07-28 MED ORDER — SODIUM CHLORIDE 0.9 % IV SOLN
INTRAVENOUS | Status: DC
  Administered 2016-07-28: 08:00:00 via INTRAVENOUS

## 2016-07-28 MED ORDER — FENTANYL CITRATE (PF) 100 MCG/2ML IJ SOLN
INTRAMUSCULAR | Status: AC | PRN
  Administered 2016-07-28 (×2): 50 ug via INTRAVENOUS

## 2016-07-28 MED ORDER — HYDROCODONE-ACETAMINOPHEN 5-325 MG PO TABS: 1.0000 | ORAL_TABLET | ORAL | Status: DC | PRN

## 2016-07-28 NOTE — Procedures (Signed)
CT guided bone marrow biopsy.  2 aspirates and 1 core.  Minimal bleeding and no immediate complication.

## 2016-07-28 NOTE — H&P (Signed)
  Chief Complaint: Patient was seen in consultation today for bone marrow biopsy at the request of Dawson,Gretchen W  Referring Physician(s): Dawson,Gretchen W  Supervising Physician: Henn, Adam  Patient Status: [facility] - Out-pt  History of Present Illness: Omar Lee is a 76 y.o. male being worked up for anemia and leukopenia. He is referred for bone marrow biopsy. Labs reviewed. Has been NPO this am. No c/o this am  Past Medical History:  Diagnosis Date  . Hyperlipidemia   . Hypothyroidism   . Reflux   . Sleep apnea     Past Surgical History:  Procedure Laterality Date  . ESOPHAGOGASTRODUODENOSCOPY (EGD) WITH ESOPHAGEAL DILATION N/A 11/22/2012   Procedure: ESOPHAGOGASTRODUODENOSCOPY (EGD) WITH ESOPHAGEAL DILATION;  Surgeon: Najeeb U Rehman, MD;  [facility];  Service: Endoscopy;  Laterality: N/A;  1120  . HEMORRHOID SURGERY    . NASAL SEPTUM SURGERY    . SHOULDER SURGERY Right   . TONSILLECTOMY AND ADENOIDECTOMY      Allergies: Patient has no known allergies.  Medications: Prior to Admission medications   Medication Sig Start Date End Date Taking? Authorizing Provider  levothyroxine (SYNTHROID, LEVOTHROID) 112 MCG tablet Take 1 tablet (112 mcg total) by mouth daily. 06/27/16  Yes Christphor S Luking, MD     Family History  Problem Relation Age of Onset  . Hypertension Mother   . Heart attack Mother   . Heart attack Father   . Diabetes Brother     Social History   Social History  . Marital status: Legally Separated    Spouse name: N/A  . Number of children: N/A  . Years of education: N/A   Social History Main Topics  . Smoking status: Never Smoker  . Smokeless tobacco: Never Used  . Alcohol use No  . Drug use: No  . Sexual activity: Not Currently    Birth control/ protection: None   Other Topics Concern  . None   Social History Narrative  . None    Review of Systems: A 12 point ROS discussed and pertinent positives are indicated  in the HPI above.  All other systems are negative.  Review of Systems  Vital Signs: BP 134/62   Pulse 67   Temp 98.4 F (36.9 C) (Oral)   Resp 18   Ht 5' 7" (1.702 m)   Wt 196 lb 9.6 oz (89.2 kg)   SpO2 100%   BMI 30.79 kg/m   Physical Exam  Constitutional: He is oriented to person, place, and time. He appears well-developed and well-nourished. No distress.  HENT:  Head: Normocephalic.  Mouth/Throat: Oropharynx is clear and moist.  Neck: Normal range of motion. No JVD present. No tracheal deviation present.  Cardiovascular: Normal rate, regular rhythm and normal heart sounds.   Pulmonary/Chest: Effort normal and breath sounds normal. No respiratory distress.  Neurological: He is alert and oriented to person, place, and time.  Skin: Skin is warm and dry.  Psychiatric: He has a normal mood and affect. Judgment normal.    Mallampati Score:  MD Evaluation Airway: WNL Heart: WNL Abdomen: WNL Chest/ Lungs: WNL ASA  Classification: 2 Mallampati/Airway Score: One  Imaging: Us Abdomen Complete  Result Date: 07/25/2016 CLINICAL DATA:  Anemia, question hepatosplenomegaly EXAM: ABDOMEN ULTRASOUND COMPLETE COMPARISON:  None FINDINGS: Gallbladder: Normally distended without stones or wall thickening. At least 2 polyps identified, 8 mm and 4 mm in size with question an additional 3 mm followup. No sonographic Murphy sign or pericholecystic fluid. Common   bile duct: Diameter: 5 mm diameter, normal Liver: Normal echogenicity without mass. Hepatopetal portal venous flow. IVC: Normal appearance Pancreas: Normal appearance Spleen: Suboptimally visualized due to high subcostal position, approximately 4.9 cm length without gross abnormality Right Kidney: Length: 13.3 cm. Normal cortical thickness and echogenicity. No mass or shadowing calcification. Mild to moderate hydronephrosis. Left Kidney: Length: 13.7 cm. Normal cortical thickness and echogenicity. No mass or shadowing calcification. Mild to  moderate hydronephrosis. 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 No additional aneurysm. Other findings: No free fluid IMPRESSION: BILATERAL mild to moderate hydronephrosis of uncertain etiology. Further evaluation by CT imaging with and without contrast recommended. Small gallbladder polyps largest 8 mm diameter ; followup ultrasound recommended in 1 year to assess stability in order to exclude malignancy. This recommendation follows ACR consensus guidelines: White Paper of the ACR Incidental Findings Committee II on Gallbladder and Biliary Findings. J Am Coll Radiol 2013:;10:953-956. Electronically Signed   By: Mark  Boles M.D.   On: 07/25/2016 08:53    Labs:  CBC:  Recent Labs  06/23/16 0825 07/21/16 0926 07/26/16 1032 07/28/16 0714  WBC 1.4* 2.1* 1.7* 1.6*  HGB  --  5.9* 8.0* 8.1*  HCT 20.1* 18.5* 24.4* 24.8*  PLT 182 156 130* 142*    COAGS:  Recent Labs  07/28/16 0714  INR 1.13  APTT 30    BMP:  Recent Labs  06/23/16 0825 07/21/16 0926 07/26/16 1032  NA 143 137 134*  K 4.4 4.3 4.4  CL 105 103 103  CO2 23 26 24  GLUCOSE 95 99 98  BUN 16 20 18  CALCIUM 8.4* 9.1 8.7*  CREATININE 1.06 1.00 0.92  GFRNONAA 68 >60 >60  GFRAA 78 >60 >60    LIVER FUNCTION TESTS:  Recent Labs  06/23/16 0825 07/21/16 0926 07/26/16 1032  BILITOT 1.4* 1.2 1.4*  AST 23 26 24  ALT 14 16* 18  ALKPHOS 50 44 47  PROT 6.4 6.7 6.7  ALBUMIN 4.5 4.3 4.3    TUMOR MARKERS: No results for input(s): AFPTM, CEA, CA199, CHROMGRNA in the last 8760 hours.  Assessment and Plan: Anemia and leukopenia For CT guided bone marrow biopsy today Labs ok Risks and Benefits discussed with the patient including, but not limited to bleeding, infection, damage to adjacent structures or low yield requiring additional tests. All of the patient's questions were answered, patient  is agreeable to proceed. Consent signed and in chart.    Thank you for this interesting consult.  I greatly enjoyed meeting Rushi R Obarr and look forward to participating in their care.  A copy of this report was sent to the requesting provider on this date.  Electronically Signed: ,  07/28/2016, 8:31 AM   I spent a total of 20 minutes in face to face in clinical consultation, greater than 50% of which was counseling/coordinating care for bone marrow biopsy.        

## 2016-07-28 NOTE — Discharge Instructions (Signed)
Bone Marrow Aspiration and Bone Marrow Biopsy, Adult, Care After °This sheet gives you information about how to care for yourself after your procedure. Your health care provider may also give you more specific instructions. If you have problems or questions, contact your health care provider. °What can I expect after the procedure? °After the procedure, it is common to have: °· Mild pain and tenderness. °· Swelling. °· Bruising. °Follow these instructions at home: °· Take over-the-counter or prescription medicines only as told by your health care provider. °· Do not take baths, swim, or use a hot tub until your health care provider approves. Ask if you can take a shower or have a sponge bath. °· Follow instructions from your health care provider about how to take care of the puncture site. Make sure you: °¨ Wash your hands with soap and water before you change your bandage (dressing). If soap and water are not available, use hand sanitizer. °¨ Change your dressing as told by your health care provider. °· Check your puncture site every day for signs of infection. Check for: °¨ More redness, swelling, or pain. °¨ More fluid or blood. °¨ Warmth. °¨ Pus or a bad smell. °· Return to your normal activities as told by your health care provider. Ask your health care provider what activities are safe for you. °· Do not drive for 24 hours if you were given a medicine to help you relax (sedative). °· Keep all follow-up visits as told by your health care provider. This is important. °Contact a health care provider if: °· You have more redness, swelling, or pain around the puncture site. °· You have more fluid or blood coming from the puncture site. °· Your puncture site feels warm to the touch. °· You have pus or a bad smell coming from the puncture site. °· You have a fever. °· Your pain is not controlled with medicine. °This information is not intended to replace advice given to you by your health care provider. Make sure you  discuss any questions you have with your health care provider. °Document Released: 10/21/2004 Document Revised: 10/22/2015 Document Reviewed: 09/15/2015 °Elsevier Interactive Patient Education © 2017 Elsevier Inc. °Moderate Conscious Sedation, Adult, Care After °These instructions provide you with information about caring for yourself after your procedure. Your health care provider may also give you more specific instructions. Your treatment has been planned according to current medical practices, but problems sometimes occur. Call your health care provider if you have any problems or questions after your procedure. °What can I expect after the procedure? °After your procedure, it is common: °· To feel sleepy for several hours. °· To feel clumsy and have poor balance for several hours. °· To have poor judgment for several hours. °· To vomit if you eat too soon. °Follow these instructions at home: °For at least 24 hours after the procedure:  ° °· Do not: °¨ Participate in activities where you could fall or become injured. °¨ Drive. °¨ Use heavy machinery. °¨ Drink alcohol. °¨ Take sleeping pills or medicines that cause drowsiness. °¨ Make important decisions or sign legal documents. °¨ Take care of children on your own. °· Rest. °Eating and drinking  °· Follow the diet recommended by your health care provider. °· If you vomit: °¨ Drink water, juice, or soup when you can drink without vomiting. °¨ Make sure you have little or no nausea before eating solid foods. °General instructions  °· Have a responsible adult stay with you until you   until you are awake and alert.  Take over-the-counter and prescription medicines only as told by your health care provider.  If you smoke, do not smoke without supervision.  Keep all follow-up visits as told by your health care provider. This is important. Contact a health care provider if:  You keep feeling nauseous or you keep vomiting.  You feel light-headed.  You develop a  rash.  You have a fever. Get help right away if:  You have trouble breathing. This information is not intended to replace advice given to you by your health care provider. Make sure you discuss any questions you have with your health care provider. Document Released: 01/22/2013 Document Revised: 09/06/2015 Document Reviewed: 07/24/2015 Elsevier Interactive Patient Education  2017 [employer].

## 2016-08-02 ENCOUNTER — Other Ambulatory Visit (HOSPITAL_COMMUNITY): Payer: Self-pay | Admitting: Oncology

## 2016-08-02 DIAGNOSIS — N1339 Other hydronephrosis: Secondary | ICD-10-CM

## 2016-08-03 ENCOUNTER — Encounter (HOSPITAL_COMMUNITY): Payer: Self-pay

## 2016-08-03 ENCOUNTER — Ambulatory Visit (HOSPITAL_COMMUNITY)
Admission: RE | Admit: 2016-08-03 | Discharge: 2016-08-03 | Disposition: A | Discharge status: Home / Self Care | Payer: Medicare HMO | Source: Ambulatory Visit | Attending: Oncology | Admitting: Oncology

## 2016-08-03 DIAGNOSIS — N1339 Other hydronephrosis: Secondary | ICD-10-CM

## 2016-08-03 DIAGNOSIS — N200 Calculus of kidney: Secondary | ICD-10-CM | POA: Insufficient documentation

## 2016-08-03 DIAGNOSIS — N132 Hydronephrosis with renal and ureteral calculous obstruction: Secondary | ICD-10-CM | POA: Diagnosis not present

## 2016-08-03 DIAGNOSIS — N281 Cyst of kidney, acquired: Secondary | ICD-10-CM | POA: Insufficient documentation

## 2016-08-03 MED ORDER — IOPAMIDOL (ISOVUE-300) INJECTION 61%
100.0000 mL | Freq: Once | INTRAVENOUS | Status: AC | PRN
  Administered 2016-08-03: 100 mL via INTRAVENOUS

## 2016-08-08 LAB — CHROMOSOME ANALYSIS, BONE MARROW

## 2016-08-08 LAB — TISSUE HYBRIDIZATION (BONE MARROW)-NCBH

## 2016-08-10 ENCOUNTER — Encounter (HOSPITAL_BASED_OUTPATIENT_CLINIC_OR_DEPARTMENT_OTHER): Payer: Medicare HMO | Admitting: Oncology

## 2016-08-10 ENCOUNTER — Other Ambulatory Visit (HOSPITAL_COMMUNITY): Payer: Self-pay | Admitting: *Deleted

## 2016-08-10 ENCOUNTER — Encounter (HOSPITAL_COMMUNITY): Payer: Medicare HMO

## 2016-08-10 ENCOUNTER — Encounter (HOSPITAL_COMMUNITY): Payer: Self-pay

## 2016-08-10 DIAGNOSIS — D464 Refractory anemia, unspecified: Secondary | ICD-10-CM

## 2016-08-10 DIAGNOSIS — D539 Nutritional anemia, unspecified: Secondary | ICD-10-CM | POA: Diagnosis not present

## 2016-08-10 DIAGNOSIS — N132 Hydronephrosis with renal and ureteral calculous obstruction: Secondary | ICD-10-CM | POA: Diagnosis not present

## 2016-08-10 DIAGNOSIS — N1330 Unspecified hydronephrosis: Secondary | ICD-10-CM | POA: Diagnosis not present

## 2016-08-10 DIAGNOSIS — Z9889 Other specified postprocedural states: Secondary | ICD-10-CM | POA: Diagnosis not present

## 2016-08-10 DIAGNOSIS — D4621 Refractory anemia with excess of blasts 1: Secondary | ICD-10-CM | POA: Diagnosis not present

## 2016-08-10 DIAGNOSIS — N200 Calculus of kidney: Secondary | ICD-10-CM | POA: Diagnosis not present

## 2016-08-10 DIAGNOSIS — D72819 Decreased white blood cell count, unspecified: Secondary | ICD-10-CM | POA: Diagnosis not present

## 2016-08-10 LAB — CBC WITH DIFFERENTIAL/PLATELET
Basophils Absolute: 0 10*3/uL (ref 0.0–0.1)
Basophils Relative: 0 %
Eosinophils Absolute: 0 10*3/uL (ref 0.0–0.7)
Eosinophils Relative: 1 %
HCT: 22.1 % — ABNORMAL LOW (ref 39.0–52.0)
Hemoglobin: 7.2 g/dL — ABNORMAL LOW (ref 13.0–17.0)
Lymphocytes Relative: 36 %
Lymphs Abs: 0.8 10*3/uL (ref 0.7–4.0)
MCH: 32.4 pg (ref 26.0–34.0)
MCHC: 32.6 g/dL (ref 30.0–36.0)
MCV: 99.5 fL (ref 78.0–100.0)
Monocytes Absolute: 0 10*3/uL — ABNORMAL LOW (ref 0.1–1.0)
Monocytes Relative: 1 %
Neutro Abs: 1.4 10*3/uL — ABNORMAL LOW (ref 1.7–7.7)
Neutrophils Relative %: 62 %
Platelets: 161 10*3/uL (ref 150–400)
RBC: 2.22 MIL/uL — ABNORMAL LOW (ref 4.22–5.81)
RDW: 22 % — ABNORMAL HIGH (ref 11.5–15.5)
WBC: 2.2 10*3/uL — ABNORMAL LOW (ref 4.0–10.5)

## 2016-08-10 LAB — COMPREHENSIVE METABOLIC PANEL WITH GFR
ALT: 21 U/L (ref 17–63)
AST: 26 U/L (ref 15–41)
Albumin: 4.2 g/dL (ref 3.5–5.0)
Alkaline Phosphatase: 44 U/L (ref 38–126)
Anion gap: 6 (ref 5–15)
BUN: 20 mg/dL (ref 6–20)
CO2: 25 mmol/L (ref 22–32)
Calcium: 8.5 mg/dL — ABNORMAL LOW (ref 8.9–10.3)
Chloride: 106 mmol/L (ref 101–111)
Creatinine, Ser: 0.98 mg/dL (ref 0.61–1.24)
GFR calc Af Amer: >60 mL/min
GFR calc non Af Amer: >60 mL/min
Glucose, Bld: 97 mg/dL (ref 65–99)
Potassium: 4 mmol/L (ref 3.5–5.1)
Sodium: 137 mmol/L (ref 135–145)
Total Bilirubin: 1 mg/dL (ref 0.3–1.2)
Total Protein: 6.6 g/dL (ref 6.5–8.1)

## 2016-08-10 LAB — VITAMIN B12: VITAMIN B 12: 838 pg/mL (ref 180–914)

## 2016-08-10 NOTE — Patient Instructions (Addendum)
[place] at [facility] Discharge Instructions  RECOMMENDATIONS MADE BY THE CONSULTANT AND ANY TEST RESULTS WILL BE SENT TO YOUR REFERRING PHYSICIAN.  You were seen today by Dr. Twana First Lab work today You will get your [name] injection next week and then every 2 weeks Follow up in 4 weeks with lab work     Thank you for choosing [place] at [facility] to provide your oncology and hematology care.  To afford each patient quality time with our provider, please arrive at least 15 minutes before your scheduled appointment time.    If you have a lab appointment with the [place] please come in thru the  Main Entrance and check in at the main information desk  You need to re-schedule your appointment should you arrive 10 or more minutes late.  We strive to give you quality time with our providers, and arriving late affects you and other patients whose appointments are after yours.  Also, if you no show three or more times for appointments you may be dismissed from the clinic at the providers discretion.     Again, thank you for choosing [facility].  Our hope is that these requests will decrease the amount of time that you wait before being seen by our physicians.       _____________________________________________________________  Should you have questions after your visit to [facility], please contact our office at [phone] between the hours of 8:30 a.m. and 4:30 p.m.  Voicemails left after 4:30 p.m. will not be returned until the following business day.  For prescription refill requests, have your pharmacy contact our office.       Resources For Cancer Patients and their Caregivers ? American Cancer Society: Can assist with transportation, wigs, general needs, runs Look Good Feel Better.        2621275528 ? Cancer Care: Provides financial assistance, online support groups, medication/co-pay  assistance.  1-800-813-HOPE (480)653-3971) ? [place] Assists [place] cancer patients and their families through emotional , educational and financial support.  (508) [phone] ? [place] DSS Where to apply for food stamps, Medicaid and utility assistance. 470-465-0964 ? RCATS: Transportation to medical appointments. 902-031-3924 ? Social Security Administration: May apply for disability if have a Stage IV cancer. [phone])880-9608 [phone] [phone] ? [employer], Disability and Transit Services: Assists with nutrition, care and transit needs. [place] Support Programs: @10RELATIVEDAYS @ > Cancer Support Group  2nd Tuesday of the month 1pm-2pm, Journey Room  > Creative Journey  3rd Tuesday of the month 1130am-1pm, Journey Room  > Look Good Feel Better  1st Wednesday of the month 10am-12 noon, Journey Room (Call [place] to register [phone])

## 2016-08-10 NOTE — Progress Notes (Signed)
[place]  PROGRESS NOTE  Patient Care Team: [name], MD as PCP - General (Family Medicine)  CHIEF COMPLAINTS/PURPOSE OF CONSULTATION:  Leukopenia Anemia  HISTORY OF PRESENTING ILLNESS:  Omar Lee 76 y.o. male is here for follow up of severe anemia. Since his last visit he has undergone a bone marrow biopsy on 07/28/16. He is accompanied by his neighbor/friend, Laurine Blazer, who helps him manage his medical care.  He has been doing well. He is very tired and loses his energy very quickly. He doesn't sleep very well and wakes up in the middle of the night, but this has been occurring for a few years. Denies recent infections, chest pain, SOB, abdominal pain, dysuria, or any other concerns.   MEDICAL HISTORY:  Past Medical History:  Diagnosis Date  . Hyperlipidemia   . Hypothyroidism   . Reflux   . Sleep apnea     SURGICAL HISTORY: Past Surgical History:  Procedure Laterality Date  . ESOPHAGOGASTRODUODENOSCOPY (EGD) WITH ESOPHAGEAL DILATION N/A 11/22/2012   Procedure: ESOPHAGOGASTRODUODENOSCOPY (EGD) WITH ESOPHAGEAL DILATION;  Surgeon: Rogene Houston, MD;  Location: [facility];  Service: Endoscopy;  Laterality: N/A;  1120  . HEMORRHOID SURGERY    . NASAL SEPTUM SURGERY    . SHOULDER SURGERY Right   . TONSILLECTOMY AND ADENOIDECTOMY      SOCIAL HISTORY: Social History   Social History  . Marital status: Legally Separated    Spouse name: N/A  . Number of children: N/A  . Years of education: N/A   Occupational History  . Not on file.   Social History Main Topics  . Smoking status: Never Smoker  . Smokeless tobacco: Never Used  . Alcohol use No  . Drug use: No  . Sexual activity: Not Currently    Birth control/ protection: None   Other Topics Concern  . Not on file   Social History Narrative  . No narrative on file  Never smoker Never drinker Lives alone  FAMILY HISTORY: Family History  Problem Relation Age of Onset    . Hypertension Mother   . Heart attack Mother   . Heart attack Father   . Diabetes Brother     ALLERGIES:  has No Known Allergies.  MEDICATIONS:  Current Outpatient Prescriptions  Medication Sig Dispense Refill  . levothyroxine (SYNTHROID, LEVOTHROID) 112 MCG tablet Take 1 tablet (112 mcg total) by mouth daily. 90 tablet 3   No current facility-administered medications for this visit.    Facility-Administered Medications Ordered in Other Visits  Medication Dose Route Frequency Provider Last Rate Last Dose  . sodium chloride flush (NS) 0.9 % injection 10 mL  10 mL Intracatheter PRN Holley Bouche, NP       Review of Systems  Constitutional: Positive for malaise/fatigue.       No recent infections  HENT: Negative.   Eyes: Negative.   Respiratory: Negative.  Negative for shortness of breath.   Cardiovascular: Positive for palpitations. Negative for chest pain.  Gastrointestinal: Negative.  Negative for abdominal pain.  Genitourinary: Negative.  Negative for dysuria.  Musculoskeletal: Negative.   Skin: Negative.   Neurological: Negative.   Endo/Heme/Allergies: Negative.   Psychiatric/Behavioral: The patient has insomnia.   All other systems reviewed and are negative. 14 point ROS was done and is otherwise as detailed above or in HPI  PHYSICAL EXAMINATION: ECOG PERFORMANCE STATUS: 1 - Symptomatic but completely ambulatory   Vitals:   08/10/16 1140  BP: 129/65  Pulse: 85  Resp: 18  Temp: 99.3 F (37.4 C)   Physical Exam  Constitutional: He is oriented to person, place, and time and well-developed, well-nourished, and in no distress.  HENT:  Head: Normocephalic and atraumatic.  Mouth/Throat: Oropharynx is clear and moist.  Eyes: Conjunctivae and EOM are normal. Pupils are equal, round, and reactive to light.  Neck: Normal range of motion. Neck supple.  Cardiovascular: Normal rate, regular rhythm and normal heart sounds.   Pulmonary/Chest: Effort normal and breath  sounds normal.  Abdominal: Soft. Bowel sounds are normal.  Abdominal hernia -reducible, nontender  Musculoskeletal: Normal range of motion.  Neurological: He is alert and oriented to person, place, and time. Gait normal.  Skin: Skin is warm and dry.  Nursing note and vitals reviewed.  LABORATORY DATA:  I have reviewed the data as listed Lab Results  Component Value Date   WBC 1.6 (L) 07/28/2016   HGB 8.1 (L) 07/28/2016   HCT 24.8 (L) 07/28/2016   MCV 100.0 07/28/2016   PLT 142 (L) 07/28/2016   CMP     Component Value Date/Time   NA 134 (L) 07/26/2016 1032   NA 143 06/23/2016 0825   K 4.4 07/26/2016 1032   CL 103 07/26/2016 1032   CO2 24 07/26/2016 1032   GLUCOSE 98 07/26/2016 1032   BUN 18 07/26/2016 1032   BUN 16 06/23/2016 0825   CREATININE 0.92 07/26/2016 1032   CALCIUM 8.7 (L) 07/26/2016 1032   PROT 6.7 07/26/2016 1032   PROT 6.4 06/23/2016 0825   ALBUMIN 4.3 07/26/2016 1032   ALBUMIN 4.5 06/23/2016 0825   AST 24 07/26/2016 1032   ALT 18 07/26/2016 1032   ALKPHOS 47 07/26/2016 1032   BILITOT 1.4 (H) 07/26/2016 1032   BILITOT 1.4 (H) 06/23/2016 0825   GFRNONAA >60 07/26/2016 1032   GFRAA >60 07/26/2016 1032   PATHOLOGY:     RADIOLOGY: I have reviewed the images below and agree with the reported results  CT Abdomen Pelvis w Contrast 08/03/2016 IMPRESSION: Punctate nonobstructing right lower pole renal calculus.  Bilateral renal sinus cysts.  No hydronephrosis.  No suspicious abdominopelvic lymphadenopathy. Spleen is normal in size.  US Abdomen 07/25/2016 IMPRESSION: BILATERAL mild to moderate hydronephrosis of uncertain etiology.  Further evaluation by CT imaging with and without contrast recommended.  Small gallbladder polyps largest 8 mm diameter ; followup ultrasound recommended in 1 year to assess stability in order to exclude malignancy.  ASSESSMENT & PLAN:  1. 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 I am still awaiting his cytogenetics to calculate his IPSS score.  I do not believe he needs G-CSF at this time since he is not having any problems with infections.  I will treat him supportively at this time for his anemia. His EPO level is below 500, therefore I will start him on weekly [name] injections. I discussed side effects. Target hemoglobin will be in the 10-12 g/dL range not to exceet 12 g/dL.  I have rechecked his CBC today and hemoglobin has dropped down to 7.3 g/dL, since he is symptomatic, I will transfuse him 2 more units pRBC tomorrow.  Reading material on MDS was provided to the patient today.   2. Bilateral hydronephrosis on abd [place] I reviewed the CT scan results  with the patient, there is no evidence of hydronephrosis on CT. He does have a nonobstructing right renal calculus. He is asymptomatic.  He will return for follow up in 4 weeks with labs.   ORDERS PLACED FOR THIS ENCOUNTER: Orders Placed This Encounter  Procedures  . CBC with Differential  . Comprehensive metabolic panel  . Vitamin B12  . Folate  . CBC with Differential    Standing Status:   Future    Standing Expiration Date:   10/05/2016  . Comprehensive metabolic panel    Standing Status:   Future    Standing Expiration Date:   10/05/2016  . Erythropoietin    Standing Status:   Future    Standing Expiration Date:   10/05/2016    All questions were answered. The patient knows to call the clinic with any problems, questions or concerns.  This document serves as a record of services personally performed by Twana First, MD. It was created on her behalf by Martinique Casey, a trained medical scribe. The creation of this record is based on the scribe's personal observations and the provider's statements to them. This document has been checked and  approved by the attending provider.  I have reviewed the above documentation for accuracy and completeness and I agree with the above.  This note was electronically signed.    Martinique M Casey  08/10/2016 12:06 PM

## 2016-08-10 NOTE — Progress Notes (Signed)
Set up for 2 units pRBC transfusion tomorrow.

## 2016-08-11 ENCOUNTER — Encounter (HOSPITAL_BASED_OUTPATIENT_CLINIC_OR_DEPARTMENT_OTHER): Payer: Medicare HMO

## 2016-08-11 ENCOUNTER — Encounter (HOSPITAL_COMMUNITY): Payer: Medicare HMO

## 2016-08-11 ENCOUNTER — Other Ambulatory Visit (HOSPITAL_COMMUNITY): Payer: Self-pay | Admitting: *Deleted

## 2016-08-11 DIAGNOSIS — D464 Refractory anemia, unspecified: Secondary | ICD-10-CM

## 2016-08-11 DIAGNOSIS — D4621 Refractory anemia with excess of blasts 1: Secondary | ICD-10-CM

## 2016-08-11 DIAGNOSIS — Z9889 Other specified postprocedural states: Secondary | ICD-10-CM | POA: Diagnosis not present

## 2016-08-11 DIAGNOSIS — N132 Hydronephrosis with renal and ureteral calculous obstruction: Secondary | ICD-10-CM | POA: Diagnosis not present

## 2016-08-11 DIAGNOSIS — D6489 Other specified anemias: Secondary | ICD-10-CM

## 2016-08-11 DIAGNOSIS — D72819 Decreased white blood cell count, unspecified: Secondary | ICD-10-CM | POA: Diagnosis not present

## 2016-08-11 DIAGNOSIS — D649 Anemia, unspecified: Secondary | ICD-10-CM

## 2016-08-11 DIAGNOSIS — D539 Nutritional anemia, unspecified: Secondary | ICD-10-CM | POA: Diagnosis not present

## 2016-08-11 LAB — CBC WITH DIFFERENTIAL/PLATELET
BASOS ABS: 0 10*3/uL (ref 0.0–0.1)
Basophils Relative: 0 %
Eosinophils Absolute: 0 10*3/uL (ref 0.0–0.7)
Eosinophils Relative: 0 %
HEMATOCRIT: 22.3 % — AB (ref 39.0–52.0)
Hemoglobin: 7.2 g/dL — ABNORMAL LOW (ref 13.0–17.0)
LYMPHS PCT: 45 %
Lymphs Abs: 0.7 10*3/uL (ref 0.7–4.0)
MCH: 32.4 pg (ref 26.0–34.0)
MCHC: 32.3 g/dL (ref 30.0–36.0)
MCV: 100.5 fL — ABNORMAL HIGH (ref 78.0–100.0)
MONO ABS: 0 10*3/uL — AB (ref 0.1–1.0)
Monocytes Relative: 2 %
Neutro Abs: 0.8 10*3/uL — ABNORMAL LOW (ref 1.7–7.7)
Neutrophils Relative %: 53 %
Platelets: 167 10*3/uL (ref 150–400)
RBC: 2.22 MIL/uL — AB (ref 4.22–5.81)
RDW: 22.3 % — ABNORMAL HIGH (ref 11.5–15.5)
WBC: 1.5 10*3/uL — AB (ref 4.0–10.5)

## 2016-08-11 LAB — FOLATE: FOLATE: 34.5 ng/mL (ref >5.9)

## 2016-08-11 LAB — PREPARE RBC (CROSSMATCH)

## 2016-08-11 MED ORDER — SODIUM CHLORIDE 0.9% FLUSH: 10.0000 mL | INTRAVENOUS | Status: DC | PRN

## 2016-08-11 MED ORDER — SODIUM CHLORIDE 0.9 % IV SOLN: 250.0000 mL | Freq: Once | INTRAVENOUS | Status: DC

## 2016-08-11 MED ORDER — DIPHENHYDRAMINE HCL 25 MG PO CAPS: 25.0000 mg | ORAL_CAPSULE | Freq: Once | ORAL | Status: DC

## 2016-08-11 MED ORDER — ACETAMINOPHEN 325 MG PO TABS: 650.0000 mg | ORAL_TABLET | Freq: Once | ORAL | Status: DC

## 2016-08-11 MED ORDER — SODIUM CHLORIDE 0.9 % IV SOLN: 250.0000 mL | Freq: Once | INTRAVENOUS | Status: AC

## 2016-08-11 MED ORDER — ACETAMINOPHEN 325 MG PO TABS
650.0000 mg | ORAL_TABLET | Freq: Once | ORAL | Status: AC
  Administered 2016-08-11: 650 mg via ORAL

## 2016-08-11 MED ORDER — DIPHENHYDRAMINE HCL 25 MG PO CAPS
ORAL_CAPSULE | ORAL | Status: AC
  Filled 2016-08-11: qty 1

## 2016-08-11 MED ORDER — DIPHENHYDRAMINE HCL 25 MG PO CAPS
25.0000 mg | ORAL_CAPSULE | Freq: Once | ORAL | Status: AC
  Administered 2016-08-11: 25 mg via ORAL

## 2016-08-11 MED ORDER — SODIUM CHLORIDE 0.9% FLUSH: 10.0000 mL | INTRAVENOUS | Status: AC | PRN

## 2016-08-11 MED ORDER — SODIUM CHLORIDE 0.9 % IV SOLN
250.0000 mL | Freq: Once | INTRAVENOUS | Status: AC
  Administered 2016-08-11: 250 mL via INTRAVENOUS

## 2016-08-11 MED ORDER — ACETAMINOPHEN 325 MG PO TABS
ORAL_TABLET | ORAL | Status: AC
  Filled 2016-08-11: qty 2

## 2016-08-11 NOTE — Progress Notes (Signed)
2 units of blood given today, patient tolerated it well with no problems. Vitals stable and discharged home from clinic ambulatory. Follow up as scheduled.

## 2016-08-11 NOTE — Progress Notes (Signed)
CRITICAL VALUE ALERT Critical value received:  WBC-1.47 Date of notification:  08/11/16 Time of notification: 3112 Critical value read back:  Yes.   Nurse who received alert:  M.Myrth Dahan, LPN MD notified (1st page):  L.Talbert Cage, MD at 1137 Also notified his treatment nurse- C.Page, RN

## 2016-08-11 NOTE — Patient Instructions (Addendum)
  [place] at [facility] Discharge Instructions  RECOMMENDATIONS MADE BY THE CONSULTANT AND ANY TEST RESULTS WILL BE SENT TO YOUR REFERRING PHYSICIAN.  You received 2 units today Follow up as scheduled.  Thank you for choosing [place] at [facility] to provide your oncology and hematology care.  To afford each patient quality time with our provider, please arrive at least 15 minutes before your scheduled appointment time.    If you have a lab appointment with the [facility] please come in thru the  Main Entrance and check in at the main information desk  You need to re-schedule your appointment should you arrive 10 or more minutes late.  We strive to give you quality time with our providers, and arriving late affects you and other patients whose appointments are after yours.  Also, if you no show three or more times for appointments you may be dismissed from the clinic at the providers discretion.     Again, thank you for choosing [facility].  Our hope is that these requests will decrease the amount of time that you wait before being seen by our physicians.       _____________________________________________________________  Should you have questions after your visit to [facility], please contact our office at [phone] between the hours of 8:30 a.m. and 4:30 p.m.  Voicemails left after 4:30 p.m. will not be returned until the following business day.  For prescription refill requests, have your pharmacy contact our office.       Resources For Cancer Patients and their Caregivers ? American Cancer Society: Can assist with transportation, wigs, general needs, runs Look Good Feel Better.        [phone] ? Cancer Care: Provides financial assistance, online support groups, medication/co-pay assistance.  1-800-813-HOPE (864) 726-0253) ? [place] Assists [place] cancer patients  and their families through emotional , educational and financial support.  (405)624-5724 ? [place] DSS Where to apply for food stamps, Medicaid and utility assistance. 5088245668 ? RCATS: Transportation to medical appointments. 9102740029 ? Social Security Administration: May apply for disability if have a Stage IV cancer. 5035986266 978-657-3531 ? [employer], Disability and Transit Services: Assists with nutrition, care and transit needs. [place] Support Programs: @10RELATIVEDAYS @ > Cancer Support Group  2nd Tuesday of the month 1pm-2pm, Journey Room  > Creative Journey  3rd Tuesday of the month 1130am-1pm, Journey Room  > Look Good Feel Better  1st Wednesday of the month 10am-12 noon, Journey Room (Call [place] to register [phone])

## 2016-08-12 LAB — TYPE AND SCREEN
ABO/RH(D): A POS
Antibody Screen: NEGATIVE
Unit division: 0
Unit division: 0

## 2016-08-12 LAB — BPAM RBC
BLOOD PRODUCT EXPIRATION DATE: 201805212359
Blood Product Expiration Date: 201805292359
ISSUE DATE / TIME: 201804270935
ISSUE DATE / TIME: 201804271144
UNIT TYPE AND RH: 600
Unit Type and Rh: 6200

## 2016-08-17 ENCOUNTER — Other Ambulatory Visit (HOSPITAL_COMMUNITY): Payer: Self-pay | Admitting: *Deleted

## 2016-08-18 ENCOUNTER — Other Ambulatory Visit (HOSPITAL_COMMUNITY): Payer: Medicare HMO

## 2016-08-18 ENCOUNTER — Encounter (HOSPITAL_COMMUNITY): Payer: Medicare HMO | Attending: Oncology

## 2016-08-18 ENCOUNTER — Ambulatory Visit (HOSPITAL_COMMUNITY): Payer: Medicare HMO

## 2016-08-18 ENCOUNTER — Encounter (HOSPITAL_COMMUNITY): Payer: Medicare HMO

## 2016-08-18 DIAGNOSIS — D469 Myelodysplastic syndrome, unspecified: Secondary | ICD-10-CM | POA: Insufficient documentation

## 2016-08-18 DIAGNOSIS — G4730 Sleep apnea, unspecified: Secondary | ICD-10-CM | POA: Diagnosis not present

## 2016-08-18 DIAGNOSIS — E785 Hyperlipidemia, unspecified: Secondary | ICD-10-CM | POA: Diagnosis not present

## 2016-08-18 DIAGNOSIS — K219 Gastro-esophageal reflux disease without esophagitis: Secondary | ICD-10-CM | POA: Diagnosis not present

## 2016-08-18 DIAGNOSIS — D4621 Refractory anemia with excess of blasts 1: Secondary | ICD-10-CM | POA: Diagnosis not present

## 2016-08-18 DIAGNOSIS — D539 Nutritional anemia, unspecified: Secondary | ICD-10-CM | POA: Diagnosis not present

## 2016-08-18 DIAGNOSIS — E039 Hypothyroidism, unspecified: Secondary | ICD-10-CM | POA: Diagnosis not present

## 2016-08-18 DIAGNOSIS — D72819 Decreased white blood cell count, unspecified: Secondary | ICD-10-CM | POA: Diagnosis present

## 2016-08-18 DIAGNOSIS — N200 Calculus of kidney: Secondary | ICD-10-CM | POA: Diagnosis not present

## 2016-08-18 DIAGNOSIS — D464 Refractory anemia, unspecified: Secondary | ICD-10-CM

## 2016-08-18 LAB — CBC WITH DIFFERENTIAL/PLATELET
Basophils Absolute: 0 10*3/uL (ref 0.0–0.1)
Basophils Relative: 0 %
EOS ABS: 0 10*3/uL (ref 0.0–0.7)
EOS PCT: 0 %
HEMATOCRIT: 30.9 % — AB (ref 39.0–52.0)
HEMOGLOBIN: 10.2 g/dL — AB (ref 13.0–17.0)
LYMPHS ABS: 0.9 10*3/uL (ref 0.7–4.0)
LYMPHS PCT: 51 %
MCH: 31.9 pg (ref 26.0–34.0)
MCHC: 33 g/dL (ref 30.0–36.0)
MCV: 96.6 fL (ref 78.0–100.0)
Monocytes Absolute: 0 10*3/uL — ABNORMAL LOW (ref 0.1–1.0)
Monocytes Relative: 1 %
Neutro Abs: 0.8 10*3/uL — ABNORMAL LOW (ref 1.7–7.7)
Neutrophils Relative %: 48 %
Platelets: 114 10*3/uL — ABNORMAL LOW (ref 150–400)
RBC: 3.2 MIL/uL — ABNORMAL LOW (ref 4.22–5.81)
RDW: 19.1 % — AB (ref 11.5–15.5)
WBC: 1.7 10*3/uL — ABNORMAL LOW (ref 4.0–10.5)

## 2016-08-18 NOTE — Progress Notes (Signed)
Labs reviewed with [name] NP and since Hgb was 10.2 and above tx parameters [name] was held. Pt given this information and next appt list and verbalized understanding. Pt discharged self ambulatory in satisfactory condition

## 2016-08-21 ENCOUNTER — Encounter (HOSPITAL_COMMUNITY): Payer: Self-pay

## 2016-08-25 ENCOUNTER — Encounter (HOSPITAL_BASED_OUTPATIENT_CLINIC_OR_DEPARTMENT_OTHER): Payer: Medicare HMO

## 2016-08-25 ENCOUNTER — Encounter (HOSPITAL_COMMUNITY): Payer: Medicare HMO

## 2016-08-25 VITALS — BP 128/68 | HR 75 | Temp 98.2°F | Resp 18

## 2016-08-25 DIAGNOSIS — E039 Hypothyroidism, unspecified: Secondary | ICD-10-CM | POA: Diagnosis not present

## 2016-08-25 DIAGNOSIS — D4621 Refractory anemia with excess of blasts 1: Secondary | ICD-10-CM | POA: Diagnosis not present

## 2016-08-25 DIAGNOSIS — D464 Refractory anemia, unspecified: Secondary | ICD-10-CM

## 2016-08-25 DIAGNOSIS — N200 Calculus of kidney: Secondary | ICD-10-CM | POA: Diagnosis not present

## 2016-08-25 DIAGNOSIS — D469 Myelodysplastic syndrome, unspecified: Secondary | ICD-10-CM | POA: Diagnosis not present

## 2016-08-25 DIAGNOSIS — K219 Gastro-esophageal reflux disease without esophagitis: Secondary | ICD-10-CM | POA: Diagnosis not present

## 2016-08-25 DIAGNOSIS — G4730 Sleep apnea, unspecified: Secondary | ICD-10-CM | POA: Diagnosis not present

## 2016-08-25 DIAGNOSIS — D539 Nutritional anemia, unspecified: Secondary | ICD-10-CM | POA: Diagnosis not present

## 2016-08-25 DIAGNOSIS — E785 Hyperlipidemia, unspecified: Secondary | ICD-10-CM | POA: Diagnosis not present

## 2016-08-25 LAB — CBC WITH DIFFERENTIAL/PLATELET
BASOS ABS: 0 10*3/uL (ref 0.0–0.1)
BASOS PCT: 0 %
Eosinophils Absolute: 0 10*3/uL (ref 0.0–0.7)
Eosinophils Relative: 0 %
HEMATOCRIT: 28.1 % — AB (ref 39.0–52.0)
Hemoglobin: 9.4 g/dL — ABNORMAL LOW (ref 13.0–17.0)
Lymphocytes Relative: 47 %
Lymphs Abs: 0.9 10*3/uL (ref 0.7–4.0)
MCH: 32.2 pg (ref 26.0–34.0)
MCHC: 33.5 g/dL (ref 30.0–36.0)
MCV: 96.2 fL (ref 78.0–100.0)
MONO ABS: 0 10*3/uL — AB (ref 0.1–1.0)
Monocytes Relative: 1 %
NEUTROS ABS: 1 10*3/uL — AB (ref 1.7–7.7)
NEUTROS PCT: 53 %
Platelets: 128 10*3/uL — ABNORMAL LOW (ref 150–400)
RBC: 2.92 MIL/uL — ABNORMAL LOW (ref 4.22–5.81)
RDW: 19 % — AB (ref 11.5–15.5)
WBC: 1.9 10*3/uL — ABNORMAL LOW (ref 4.0–10.5)

## 2016-08-25 MED ORDER — DARBEPOETIN ALFA 300 MCG/0.6ML IJ SOSY
PREFILLED_SYRINGE | INTRAMUSCULAR | Status: AC
  Filled 2016-08-25: qty 0.6

## 2016-08-25 MED ORDER — DARBEPOETIN ALFA 300 MCG/0.6ML IJ SOSY
300.0000 ug | PREFILLED_SYRINGE | Freq: Once | INTRAMUSCULAR | Status: AC
  Administered 2016-08-25: 300 ug via SUBCUTANEOUS

## 2016-08-25 NOTE — Patient Instructions (Signed)
[place] at [facility] Discharge Instructions  RECOMMENDATIONS MADE BY THE CONSULTANT AND ANY TEST RESULTS WILL BE SENT TO YOUR REFERRING PHYSICIAN.  Hemoglobin 9.4. [name] 300 mcg injection given.  Thank you for choosing [place] at [facility] to provide your oncology and hematology care.  To afford each patient quality time with our provider, please arrive at least 15 minutes before your scheduled appointment time.    If you have a lab appointment with the [place] please come in thru the  Main Entrance and check in at the main information desk  You need to re-schedule your appointment should you arrive 10 or more minutes late.  We strive to give you quality time with our providers, and arriving late affects you and other patients whose appointments are after yours.  Also, if you no show three or more times for appointments you may be dismissed from the clinic at the providers discretion.     Again, thank you for choosing [facility].  Our hope is that these requests will decrease the amount of time that you wait before being seen by our physicians.       _____________________________________________________________  Should you have questions after your visit to [facility], please contact our office at [phone] between the hours of 8:30 a.m. and 4:30 p.m.  Voicemails left after 4:30 p.m. will not be returned until the following business day.  For prescription refill requests, have your pharmacy contact our office.       Resources For Cancer Patients and their Caregivers ? American Cancer Society: Can assist with transportation, wigs, general needs, runs Look Good Feel Better.        971-522-5334 ? Cancer Care: Provides financial assistance, online support groups, medication/co-pay assistance.  1-800-813-HOPE 970-514-4882) ? [place] Assists [place] cancer patients and  their families through emotional , educational and financial support.  503-271-4253 ? [place] DSS Where to apply for food stamps, Medicaid and utility assistance. 256-588-0897 ? RCATS: Transportation to medical appointments. 984-791-2759 ? Social Security Administration: May apply for disability if have a Stage IV cancer. [phone] ? [employer], Disability and Transit Services: Assists with nutrition, care and transit needs. [place] Support Programs: @10RELATIVEDAYS @ > Cancer Support Group  2nd Tuesday of the month 1pm-2pm, Journey Room  > Creative Journey  3rd Tuesday of the month 1130am-1pm, Journey Room  > Look Good Feel Better  1st Wednesday of the month 10am-12 noon, Journey Room (Call [place] to register [phone])

## 2016-08-25 NOTE — Progress Notes (Signed)
Omar Lee presents today for injection per MD orders. [name] 300 mcg administered SQ in left Abdomen. Administration without incident. Patient tolerated well. Stable and ambulatory on discharge home to self.

## 2016-08-31 ENCOUNTER — Other Ambulatory Visit (HOSPITAL_COMMUNITY): Payer: Self-pay | Admitting: *Deleted

## 2016-08-31 DIAGNOSIS — D464 Refractory anemia, unspecified: Secondary | ICD-10-CM

## 2016-09-01 ENCOUNTER — Encounter (HOSPITAL_COMMUNITY): Payer: Medicare HMO

## 2016-09-01 VITALS — BP 133/58 | HR 87 | Temp 99.0°F | Resp 18

## 2016-09-01 DIAGNOSIS — D539 Nutritional anemia, unspecified: Secondary | ICD-10-CM | POA: Diagnosis not present

## 2016-09-01 DIAGNOSIS — E785 Hyperlipidemia, unspecified: Secondary | ICD-10-CM | POA: Diagnosis not present

## 2016-09-01 DIAGNOSIS — G4730 Sleep apnea, unspecified: Secondary | ICD-10-CM | POA: Diagnosis not present

## 2016-09-01 DIAGNOSIS — N200 Calculus of kidney: Secondary | ICD-10-CM | POA: Diagnosis not present

## 2016-09-01 DIAGNOSIS — D4621 Refractory anemia with excess of blasts 1: Secondary | ICD-10-CM | POA: Diagnosis not present

## 2016-09-01 DIAGNOSIS — D469 Myelodysplastic syndrome, unspecified: Secondary | ICD-10-CM | POA: Diagnosis not present

## 2016-09-01 DIAGNOSIS — D464 Refractory anemia, unspecified: Secondary | ICD-10-CM

## 2016-09-01 DIAGNOSIS — E039 Hypothyroidism, unspecified: Secondary | ICD-10-CM | POA: Diagnosis not present

## 2016-09-01 DIAGNOSIS — K219 Gastro-esophageal reflux disease without esophagitis: Secondary | ICD-10-CM | POA: Diagnosis not present

## 2016-09-01 LAB — SAMPLE TO BLOOD BANK

## 2016-09-01 LAB — CBC WITH DIFFERENTIAL/PLATELET
BASOS PCT: 0 %
Basophils Absolute: 0 10*3/uL (ref 0.0–0.1)
Eosinophils Absolute: 0 10*3/uL (ref 0.0–0.7)
Eosinophils Relative: 1 %
HCT: 26.6 % — ABNORMAL LOW (ref 39.0–52.0)
HEMOGLOBIN: 9 g/dL — AB (ref 13.0–17.0)
LYMPHS ABS: 0.8 10*3/uL (ref 0.7–4.0)
Lymphocytes Relative: 41 %
MCH: 32.5 pg (ref 26.0–34.0)
MCHC: 33.8 g/dL (ref 30.0–36.0)
MCV: 96 fL (ref 78.0–100.0)
MONOS PCT: 1 %
Monocytes Absolute: 0 10*3/uL — ABNORMAL LOW (ref 0.1–1.0)
NEUTROS ABS: 1.2 10*3/uL — AB (ref 1.7–7.7)
Neutrophils Relative %: 58 %
Platelets: 140 10*3/uL — ABNORMAL LOW (ref 150–400)
RBC: 2.77 MIL/uL — ABNORMAL LOW (ref 4.22–5.81)
RDW: 19.4 % — ABNORMAL HIGH (ref 11.5–15.5)
WBC: 2.1 10*3/uL — ABNORMAL LOW (ref 4.0–10.5)

## 2016-09-01 MED ORDER — DARBEPOETIN ALFA 300 MCG/0.6ML IJ SOSY: 300.0000 ug | PREFILLED_SYRINGE | Freq: Once | INTRAMUSCULAR | Status: DC

## 2016-09-01 NOTE — Progress Notes (Signed)
No aranesp today per MD. Will start doing aranesp q 2 weeks starting next week on the 25th. Per MD.   Follow up as scheduled.

## 2016-09-07 ENCOUNTER — Other Ambulatory Visit (HOSPITAL_COMMUNITY): Payer: Medicare HMO

## 2016-09-07 ENCOUNTER — Ambulatory Visit (HOSPITAL_COMMUNITY): Payer: Medicare HMO

## 2016-09-08 ENCOUNTER — Encounter (HOSPITAL_COMMUNITY): Payer: Self-pay

## 2016-09-08 ENCOUNTER — Encounter (HOSPITAL_BASED_OUTPATIENT_CLINIC_OR_DEPARTMENT_OTHER): Payer: Medicare HMO | Admitting: Oncology

## 2016-09-08 ENCOUNTER — Ambulatory Visit (HOSPITAL_COMMUNITY): Payer: Medicare HMO

## 2016-09-08 ENCOUNTER — Encounter (HOSPITAL_BASED_OUTPATIENT_CLINIC_OR_DEPARTMENT_OTHER): Payer: Medicare HMO

## 2016-09-08 ENCOUNTER — Encounter (HOSPITAL_COMMUNITY): Payer: Medicare HMO

## 2016-09-08 VITALS — BP 145/63 | HR 84 | Temp 98.1°F | Resp 18 | Wt 195.8 lb

## 2016-09-08 DIAGNOSIS — D4621 Refractory anemia with excess of blasts 1: Secondary | ICD-10-CM

## 2016-09-08 DIAGNOSIS — G4730 Sleep apnea, unspecified: Secondary | ICD-10-CM | POA: Diagnosis not present

## 2016-09-08 DIAGNOSIS — D464 Refractory anemia, unspecified: Secondary | ICD-10-CM

## 2016-09-08 DIAGNOSIS — E039 Hypothyroidism, unspecified: Secondary | ICD-10-CM | POA: Diagnosis not present

## 2016-09-08 DIAGNOSIS — E785 Hyperlipidemia, unspecified: Secondary | ICD-10-CM | POA: Diagnosis not present

## 2016-09-08 DIAGNOSIS — D469 Myelodysplastic syndrome, unspecified: Secondary | ICD-10-CM | POA: Diagnosis not present

## 2016-09-08 DIAGNOSIS — D539 Nutritional anemia, unspecified: Secondary | ICD-10-CM | POA: Diagnosis not present

## 2016-09-08 DIAGNOSIS — K219 Gastro-esophageal reflux disease without esophagitis: Secondary | ICD-10-CM | POA: Diagnosis not present

## 2016-09-08 DIAGNOSIS — N200 Calculus of kidney: Secondary | ICD-10-CM | POA: Diagnosis not present

## 2016-09-08 DIAGNOSIS — D649 Anemia, unspecified: Secondary | ICD-10-CM

## 2016-09-08 LAB — CBC WITH DIFFERENTIAL/PLATELET
BASOS ABS: 0 10*3/uL (ref 0.0–0.1)
Basophils Relative: 0 %
EOS PCT: 0 %
Eosinophils Absolute: 0 10*3/uL (ref 0.0–0.7)
HCT: 25 % — ABNORMAL LOW (ref 39.0–52.0)
Hemoglobin: 8.2 g/dL — ABNORMAL LOW (ref 13.0–17.0)
Lymphocytes Relative: 45 %
Lymphs Abs: 0.9 10*3/uL (ref 0.7–4.0)
MCH: 31.3 pg (ref 26.0–34.0)
MCHC: 32.8 g/dL (ref 30.0–36.0)
MCV: 95.4 fL (ref 78.0–100.0)
Monocytes Absolute: 0 10*3/uL — ABNORMAL LOW (ref 0.1–1.0)
Monocytes Relative: 2 %
NEUTROS ABS: 1 10*3/uL — AB (ref 1.7–7.7)
NEUTROS PCT: 53 %
Platelets: 133 10*3/uL — ABNORMAL LOW (ref 150–400)
RBC: 2.62 MIL/uL — AB (ref 4.22–5.81)
RDW: 20.1 % — AB (ref 11.5–15.5)
WBC: 1.9 10*3/uL — AB (ref 4.0–10.5)

## 2016-09-08 LAB — COMPREHENSIVE METABOLIC PANEL
ALT: 21 U/L (ref 17–63)
ANION GAP: 9 (ref 5–15)
AST: 29 U/L (ref 15–41)
Albumin: 4.2 g/dL (ref 3.5–5.0)
Alkaline Phosphatase: 42 U/L (ref 38–126)
BILIRUBIN TOTAL: 1.3 mg/dL — AB (ref 0.3–1.2)
BUN: 19 mg/dL (ref 6–20)
CO2: 23 mmol/L (ref 22–32)
Calcium: 8.7 mg/dL — ABNORMAL LOW (ref 8.9–10.3)
Chloride: 105 mmol/L (ref 101–111)
Creatinine, Ser: 1.09 mg/dL (ref 0.61–1.24)
Glucose, Bld: 110 mg/dL — ABNORMAL HIGH (ref 65–99)
POTASSIUM: 3.6 mmol/L (ref 3.5–5.1)
Sodium: 137 mmol/L (ref 135–145)
TOTAL PROTEIN: 6.6 g/dL (ref 6.5–8.1)

## 2016-09-08 LAB — SAMPLE TO BLOOD BANK

## 2016-09-08 MED ORDER — DARBEPOETIN ALFA 300 MCG/0.6ML IJ SOSY
300.0000 ug | PREFILLED_SYRINGE | Freq: Once | INTRAMUSCULAR | Status: AC
  Administered 2016-09-08: 300 ug via SUBCUTANEOUS

## 2016-09-08 NOTE — Patient Instructions (Signed)
[facility] at [facility] Discharge Instructions  RECOMMENDATIONS MADE BY THE CONSULTANT AND ANY TEST RESULTS WILL BE SENT TO YOUR REFERRING PHYSICIAN.  You saw Dr. Zhou today. See Amy at checkout for appointments.   Thank you for choosing [facility] at [facility] to provide your oncology and hematology care.  To afford each patient quality time with our provider, please arrive at least 15 minutes before your scheduled appointment time.    If you have a lab appointment with the Cancer Center please come in thru the  Main Entrance and check in at the main information desk  You need to re-schedule your appointment should you arrive 10 or more minutes late.  We strive to give you quality time with our providers, and arriving late affects you and other patients whose appointments are after yours.  Also, if you no show three or more times for appointments you may be dismissed from the clinic at the providers discretion.     Again, thank you for choosing [facility].  Our hope is that these requests will decrease the amount of time that you wait before being seen by our physicians.       _____________________________________________________________  Should you have questions after your visit to [facility], please contact our office at [phone] between the hours of 8:30 a.m. and 4:30 p.m.  Voicemails left after 4:30 p.m. will not be returned until the following business day.  For prescription refill requests, have your pharmacy contact our office.       Resources For Cancer Patients and their Caregivers ? American Cancer Society: Can assist with transportation, wigs, general needs, runs Look Good Feel Better.        1-888-227-6333 ? Cancer Care: Provides financial assistance, online support groups, medication/co-pay assistance.  1-800-813-HOPE (4673) ? [facility] Assists [place]  cancer patients and their families through emotional , educational and financial support.  336-427-4357 ? [place] DSS Where to apply for food stamps, Medicaid and utility assistance. 336-342-1394 ? RCATS: Transportation to medical appointments. 336-347-2287 ? Social Security Administration: May apply for disability if have a Stage IV cancer. 336-342-7796 1-800-772-1213 ? [place] Aging, Disability and Transit Services: Assists with nutrition, care and transit needs. 336-349-2343  Cancer Center Support Programs: @10RELATIVEDAYS@ > Cancer Support Group  2nd Tuesday of the month 1pm-2pm, Journey Room  > Creative Journey  3rd Tuesday of the month 1130am-1pm, Journey Room  > Look Good Feel Better  1st Wednesday of the month 10am-12 noon, Journey Room (Call [employer] to register [phone])    

## 2016-09-08 NOTE — Patient Instructions (Signed)
[place] at [facility] Discharge Instructions  RECOMMENDATIONS MADE BY THE CONSULTANT AND ANY TEST RESULTS WILL BE SENT TO YOUR REFERRING PHYSICIAN.  [name] given per orders today Follow up as scheduled.  Thank you for choosing [place] at [facility] to provide your oncology and hematology care.  To afford each patient quality time with our provider, please arrive at least 15 minutes before your scheduled appointment time.    If you have a lab appointment with the [place] please come in thru the  Main Entrance and check in at the main information desk  You need to re-schedule your appointment should you arrive 10 or more minutes late.  We strive to give you quality time with our providers, and arriving late affects you and other patients whose appointments are after yours.  Also, if you no show three or more times for appointments you may be dismissed from the clinic at the providers discretion.     Again, thank you for choosing [facility].  Our hope is that these requests will decrease the amount of time that you wait before being seen by our physicians.       _____________________________________________________________  Should you have questions after your visit to [facility], please contact our office at [phone] between the hours of 8:30 a.m. and 4:30 p.m.  Voicemails left after 4:30 p.m. will not be returned until the following business day.  For prescription refill requests, have your pharmacy contact our office.       Resources For Cancer Patients and their Caregivers ? American Cancer Society: Can assist with transportation, wigs, general needs, runs Look Good Feel Better.        210-725-0754 ? Cancer Care: Provides financial assistance, online support groups, medication/co-pay assistance.  1-800-813-HOPE (820)476-5510) ? [place] Assists [place] cancer  patients and their families through emotional , educational and financial support.  712-162-5997 ? [place] DSS Where to apply for food stamps, Medicaid and utility assistance. (469)693-7720 ? RCATS: Transportation to medical appointments. 5514796577 ? Social Security Administration: May apply for disability if have a Stage IV cancer. 564 655 7535 [phone])569-5034 ? [employer], Disability and Transit Services: Assists with nutrition, care and transit needs. [place] Support Programs: @10RELATIVEDAYS @ > Cancer Support Group  2nd Tuesday of the month 1pm-2pm, Journey Room  > Creative Journey  3rd Tuesday of the month 1130am-1pm, Journey Room  > Look Good Feel Better  1st Wednesday of the month 10am-12 noon, Journey Room (Call [place] to register [phone])

## 2016-09-08 NOTE — Progress Notes (Signed)
Omar Lee presents today for injection per MD orders. [name] 342mcg administered SQ in right Abdomen. Administration without incident. Patient tolerated well.

## 2016-09-08 NOTE — Progress Notes (Signed)
[place]  PROGRESS NOTE  Patient Care Team: [name], MD as PCP - General (Family Medicine)  CHIEF COMPLAINTS/PURPOSE OF CONSULTATION:  Leukopenia Anemia  HISTORY OF PRESENTING ILLNESS:  Omar Lee 77 y.o. male is here for follow up of severe anemia. Since his last visit he has undergone a bone marrow biopsy on 07/28/16. He is accompanied by his neighbor/friend, Laurine Blazer, who helps him manage his medical care.  Patient states that he still feels fatigued. His appetite is poor but he forces himself to eat. Patient is still active and tries to do what he can around the house. Denies recent infections, chest pain, SOB, abdominal pain, dysuria, or any other concerns.   MEDICAL HISTORY:  Past Medical History:  Diagnosis Date  . Hyperlipidemia   . Hypothyroidism   . Reflux   . Sleep apnea     SURGICAL HISTORY: Past Surgical History:  Procedure Laterality Date  . ESOPHAGOGASTRODUODENOSCOPY (EGD) WITH ESOPHAGEAL DILATION N/A 11/22/2012   Procedure: ESOPHAGOGASTRODUODENOSCOPY (EGD) WITH ESOPHAGEAL DILATION;  Surgeon: Rogene Houston, MD;  Location: [facility];  Service: Endoscopy;  Laterality: N/A;  1120  . HEMORRHOID SURGERY    . NASAL SEPTUM SURGERY    . SHOULDER SURGERY Right   . TONSILLECTOMY AND ADENOIDECTOMY      SOCIAL HISTORY: Social History   Social History  . Marital status: Divorced    Spouse name: N/A  . Number of children: N/A  . Years of education: N/A   Occupational History  . Not on file.   Social History Main Topics  . Smoking status: Never Smoker  . Smokeless tobacco: Never Used  . Alcohol use No  . Drug use: No  . Sexual activity: Not Currently    Birth control/ protection: None   Other Topics Concern  . Not on file   Social History Narrative  . No narrative on file  Never smoker Never drinker Lives alone  FAMILY HISTORY: Family History  Problem Relation Age of Onset  . Hypertension Mother   .  Heart attack Mother   . Heart attack Father   . Diabetes Brother     ALLERGIES:  has No Known Allergies.  MEDICATIONS:  Current Outpatient Prescriptions  Medication Sig Dispense Refill  . levothyroxine (SYNTHROID, LEVOTHROID) 112 MCG tablet Take 1 tablet (112 mcg total) by mouth daily. 90 tablet 3   No current facility-administered medications for this visit.    Facility-Administered Medications Ordered in Other Visits  Medication Dose Route Frequency Provider Last Rate Last Dose  . 0.9 %  sodium chloride infusion  250 mL Intravenous Once Twana First, MD      . acetaminophen (TYLENOL) tablet 650 mg  650 mg Oral Once Twana First, MD      . diphenhydrAMINE (BENADRYL) capsule 25 mg  25 mg Oral Once Twana First, MD      . sodium chloride flush (NS) 0.9 % injection 10 mL  10 mL Intracatheter PRN Holley Bouche, NP      . sodium chloride flush (NS) 0.9 % injection 10 mL  10 mL Intracatheter PRN Twana First, MD       Review of Systems  Constitutional: Positive for malaise/fatigue.       No recent infections  HENT: Negative.   Eyes: Negative.   Respiratory: Negative.  Negative for shortness of breath.   Cardiovascular: Negative for chest pain and palpitations.  Gastrointestinal: Negative.  Negative for abdominal pain.  Genitourinary: Negative.  Negative for dysuria.  Musculoskeletal: Negative.   Skin: Negative.   Neurological: Negative.   Endo/Heme/Allergies: Negative.   Psychiatric/Behavioral: The patient does not have insomnia.   All other systems reviewed and are negative. 14 point ROS was done and is otherwise as detailed above or in HPI  PHYSICAL EXAMINATION: ECOG PERFORMANCE STATUS: 1 - Symptomatic but completely ambulatory   Vitals:   09/08/16 1110  BP: (!) 145/63  Pulse: 84  Resp: 18  Temp: 98.1 F (36.7 C)   Physical Exam  Constitutional: He is oriented to person, place, and time and well-developed, well-nourished, and in no distress. No distress.  HENT:    Head: Normocephalic and atraumatic.  Mouth/Throat: Oropharynx is clear and moist. No oropharyngeal exudate.  Eyes: Conjunctivae and EOM are normal. Pupils are equal, round, and reactive to light. No scleral icterus.  Neck: Normal range of motion. Neck supple. No JVD present.  Cardiovascular: Normal rate, regular rhythm and normal heart sounds.  Exam reveals no gallop and no friction rub.   No murmur heard. Pulmonary/Chest: Effort normal and breath sounds normal. No respiratory distress. He has no wheezes. He has no rales.  Abdominal: Soft. Bowel sounds are normal. He exhibits no distension. There is no tenderness. There is no guarding.  Abdominal hernia -reducible, nontender  Musculoskeletal: Normal range of motion. He exhibits no edema or tenderness.  Lymphadenopathy:    He has no cervical adenopathy.  Neurological: He is alert and oriented to person, place, and time. No cranial nerve deficit. Gait normal.  Skin: Skin is warm and dry. No rash noted. No erythema. No pallor.  Psychiatric: Affect and judgment normal.  Nursing note and vitals reviewed.  LABORATORY DATA:  I have reviewed the data as listed Lab Results  Component Value Date   WBC 1.9 (L) 09/08/2016   HGB 8.2 (L) 09/08/2016   HCT 25.0 (L) 09/08/2016   MCV 95.4 09/08/2016   PLT 133 (L) 09/08/2016   CMP     Component Value Date/Time   NA 137 09/08/2016 0953   NA 143 06/23/2016 0825   K 3.6 09/08/2016 0953   CL 105 09/08/2016 0953   CO2 23 09/08/2016 0953   GLUCOSE 110 (H) 09/08/2016 0953   BUN 19 09/08/2016 0953   BUN 16 06/23/2016 0825   CREATININE 1.09 09/08/2016 0953   CALCIUM 8.7 (L) 09/08/2016 0953   PROT 6.6 09/08/2016 0953   PROT 6.4 06/23/2016 0825   ALBUMIN 4.2 09/08/2016 0953   ALBUMIN 4.5 06/23/2016 0825   AST 29 09/08/2016 0953   ALT 21 09/08/2016 0953   ALKPHOS 42 09/08/2016 0953   BILITOT 1.3 (H) 09/08/2016 0953   BILITOT 1.4 (H) 06/23/2016 0825   GFRNONAA >60 09/08/2016 0953   GFRAA >60  09/08/2016 5465   PATHOLOGY:     RADIOLOGY: I have reviewed the images below and agree with the reported results  CT Abdomen Pelvis w Contrast 08/03/2016 IMPRESSION: Punctate nonobstructing right lower pole renal calculus.  Bilateral renal sinus cysts.  No hydronephrosis.  No suspicious abdominopelvic lymphadenopathy. Spleen is normal in size.  US Abdomen 07/25/2016 IMPRESSION: BILATERAL mild to moderate hydronephrosis of uncertain etiology.  Further evaluation by CT imaging with and without contrast recommended.  Small gallbladder polyps largest 8 mm diameter ; followup ultrasound recommended in 1 year to assess stability in order to exclude malignancy.  ASSESSMENT & PLAN:  1. 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 Molecular cytogenetics normal, no evidence of deletion 5q.  Patient's hemoglobin is a 14 g/dL today. Therefore I will increase his [name] injections to every week.  Return to clinic weekly for CBC, CMP and [name] injections.  He will return for follow up in 6 weeks with labs.   ORDERS PLACED FOR THIS ENCOUNTER: No orders of the defined types were placed in this encounter.   All questions were answered. The patient knows to call the clinic with any problems, questions or concerns.  This document serves as a record of services personally performed by Twana First, MD. It was created on her behalf by Martinique Casey, a trained medical scribe. The creation of this record is based on the scribe's personal observations and the provider's statements to them. This document has been checked and approved by the attending provider.  I have reviewed the above documentation for accuracy and completeness and I agree with the above.  This note was electronically signed.    Twana First, MD  09/08/2016 11:50 AM

## 2016-09-09 LAB — ERYTHROPOIETIN: Erythropoietin: 84.7 m[IU]/mL — ABNORMAL HIGH (ref 2.6–18.5)

## 2016-09-15 ENCOUNTER — Ambulatory Visit (HOSPITAL_COMMUNITY): Payer: Medicare HMO

## 2016-09-15 ENCOUNTER — Other Ambulatory Visit (HOSPITAL_COMMUNITY): Payer: Medicare HMO

## 2016-09-15 ENCOUNTER — Encounter (HOSPITAL_COMMUNITY): Payer: Medicare HMO | Attending: Oncology

## 2016-09-15 ENCOUNTER — Encounter (HOSPITAL_COMMUNITY): Payer: Medicare HMO

## 2016-09-15 VITALS — BP 108/57 | HR 95 | Temp 98.7°F | Resp 20

## 2016-09-15 DIAGNOSIS — D464 Refractory anemia, unspecified: Secondary | ICD-10-CM

## 2016-09-15 DIAGNOSIS — D4621 Refractory anemia with excess of blasts 1: Secondary | ICD-10-CM | POA: Diagnosis not present

## 2016-09-15 DIAGNOSIS — D649 Anemia, unspecified: Secondary | ICD-10-CM | POA: Diagnosis present

## 2016-09-15 LAB — SAMPLE TO BLOOD BANK

## 2016-09-15 LAB — CBC WITH DIFFERENTIAL/PLATELET
Basophils Absolute: 0 10*3/uL (ref 0.0–0.1)
Basophils Relative: 0 %
EOS ABS: 0 10*3/uL (ref 0.0–0.7)
EOS PCT: 0 %
HCT: 24.3 % — ABNORMAL LOW (ref 39.0–52.0)
Hemoglobin: 8.1 g/dL — ABNORMAL LOW (ref 13.0–17.0)
LYMPHS ABS: 0.4 10*3/uL — AB (ref 0.7–4.0)
Lymphocytes Relative: 6 %
MCH: 31.9 pg (ref 26.0–34.0)
MCHC: 33.3 g/dL (ref 30.0–36.0)
MCV: 95.7 fL (ref 78.0–100.0)
MONO ABS: 0 10*3/uL — AB (ref 0.1–1.0)
MONOS PCT: 0 %
Neutro Abs: 7.5 10*3/uL (ref 1.7–7.7)
Neutrophils Relative %: 94 %
Platelets: 138 10*3/uL — ABNORMAL LOW (ref 150–400)
RBC: 2.54 MIL/uL — AB (ref 4.22–5.81)
RDW: 20.5 % — AB (ref 11.5–15.5)
WBC: 8 10*3/uL (ref 4.0–10.5)

## 2016-09-15 MED ORDER — DARBEPOETIN ALFA 300 MCG/0.6ML IJ SOSY
300.0000 ug | PREFILLED_SYRINGE | Freq: Once | INTRAMUSCULAR | Status: AC
  Administered 2016-09-15: 300 ug via SUBCUTANEOUS

## 2016-09-15 MED ORDER — DARBEPOETIN ALFA 300 MCG/0.6ML IJ SOSY
PREFILLED_SYRINGE | INTRAMUSCULAR | Status: AC
  Filled 2016-09-15: qty 0.6

## 2016-09-15 NOTE — Patient Instructions (Signed)
[place] at [facility] Discharge Instructions  RECOMMENDATIONS MADE BY THE CONSULTANT AND ANY TEST RESULTS WILL BE SENT TO YOUR REFERRING PHYSICIAN.  Hemoglobin 8.1. [name] 300 mcg injection given as ordered. Return as scheduled.  Thank you for choosing [place] at [facility] to provide your oncology and hematology care.  To afford each patient quality time with our provider, please arrive at least 15 minutes before your scheduled appointment time.    If you have a lab appointment with the [place] please come in thru the  Main Entrance and check in at the main information desk  You need to re-schedule your appointment should you arrive 10 or more minutes late.  We strive to give you quality time with our providers, and arriving late affects you and other patients whose appointments are after yours.  Also, if you no show three or more times for appointments you may be dismissed from the clinic at the providers discretion.     Again, thank you for choosing [facility].  Our hope is that these requests will decrease the amount of time that you wait before being seen by our physicians.       _____________________________________________________________  Should you have questions after your visit to [facility], please contact our office at [phone] between the hours of 8:30 a.m. and 4:30 p.m.  Voicemails left after 4:30 p.m. will not be returned until the following business day.  For prescription refill requests, have your pharmacy contact our office.       Resources For Cancer Patients and their Caregivers ? American Cancer Society: Can assist with transportation, wigs, general needs, runs Look Good Feel Better.        8437635437 ? Cancer Care: Provides financial assistance, online support groups, medication/co-pay assistance.  1-800-813-HOPE [phone]) ? [place] Assists  [place] cancer patients and their families through emotional , educational and financial support.  [phone] ? [place] DSS Where to apply for food stamps, Medicaid and utility assistance. ([phone] ? RCATS: Transportation to medical appointments. 2095055914 ? Social Security Administration: May apply for disability if have a Stage IV cancer. 475-539-4342 781-250-0091 ? [employer], Disability and Transit Services: Assists with nutrition, care and transit needs. [place] Support Programs: @10RELATIVEDAYS @ > Cancer Support Group  2nd Tuesday of the month 1pm-2pm, Journey Room  > Creative Journey  3rd Tuesday of the month 1130am-1pm, Journey Room  > Look Good Feel Better  1st Wednesday of the month 10am-12 noon, Journey Room (Call [place] to register [phone])

## 2016-09-15 NOTE — Progress Notes (Signed)
Labs reviewed with Dr.Zhou. Give aranesp as ordered. No Andrea Colglazier orders received. Patient presents today for injection per MD orders. [name] 300 mcg administered SQ in left Abdomen. Administration without incident. Patient tolerated well. Stable and ambulatory on discharge home to self.

## 2016-09-22 ENCOUNTER — Encounter (HOSPITAL_BASED_OUTPATIENT_CLINIC_OR_DEPARTMENT_OTHER): Payer: Medicare HMO

## 2016-09-22 ENCOUNTER — Encounter (HOSPITAL_COMMUNITY): Payer: Medicare HMO

## 2016-09-22 VITALS — BP 125/62 | HR 99 | Temp 98.4°F | Resp 18

## 2016-09-22 DIAGNOSIS — D464 Refractory anemia, unspecified: Secondary | ICD-10-CM

## 2016-09-22 DIAGNOSIS — D4621 Refractory anemia with excess of blasts 1: Secondary | ICD-10-CM | POA: Diagnosis not present

## 2016-09-22 DIAGNOSIS — D649 Anemia, unspecified: Secondary | ICD-10-CM

## 2016-09-22 LAB — CBC WITH DIFFERENTIAL/PLATELET
BASOS PCT: 0 %
Basophils Absolute: 0 10*3/uL (ref 0.0–0.1)
EOS ABS: 0 10*3/uL (ref 0.0–0.7)
Eosinophils Relative: 0 %
HEMATOCRIT: 24.8 % — AB (ref 39.0–52.0)
HEMOGLOBIN: 8.1 g/dL — AB (ref 13.0–17.0)
LYMPHS PCT: 44 %
Lymphs Abs: 0.9 10*3/uL (ref 0.7–4.0)
MCH: 31.6 pg (ref 26.0–34.0)
MCHC: 32.7 g/dL (ref 30.0–36.0)
MCV: 96.9 fL (ref 78.0–100.0)
Monocytes Absolute: 0 10*3/uL — ABNORMAL LOW (ref 0.1–1.0)
Monocytes Relative: 2 %
NEUTROS ABS: 1.2 10*3/uL — AB (ref 1.7–7.7)
Neutrophils Relative %: 54 %
PLATELETS: 144 10*3/uL — AB (ref 150–400)
RBC: 2.56 MIL/uL — ABNORMAL LOW (ref 4.22–5.81)
RDW: 21.6 % — ABNORMAL HIGH (ref 11.5–15.5)
WBC: 2.1 10*3/uL — ABNORMAL LOW (ref 4.0–10.5)

## 2016-09-22 MED ORDER — DARBEPOETIN ALFA 300 MCG/0.6ML IJ SOSY
PREFILLED_SYRINGE | INTRAMUSCULAR | Status: AC
  Filled 2016-09-22: qty 0.6

## 2016-09-22 MED ORDER — DARBEPOETIN ALFA 300 MCG/0.6ML IJ SOSY
300.0000 ug | PREFILLED_SYRINGE | Freq: Once | INTRAMUSCULAR | Status: AC
  Administered 2016-09-22: 300 ug via SUBCUTANEOUS

## 2016-09-22 NOTE — Progress Notes (Signed)
Omar Lee presents today for injection per MD orders. [name] 300 mcg administered SQ in left Abdomen. Administration without incident. Patient tolerated well.

## 2016-09-22 NOTE — Patient Instructions (Signed)
[place] at [facility] Discharge Instructions  RECOMMENDATIONS MADE BY THE CONSULTANT AND ANY TEST RESULTS WILL BE SENT TO YOUR REFERRING PHYSICIAN.  Hemoglobin 8.1 today. [name] 300 mcg injection given as ordered. Return as scheduled.  Thank you for choosing [place] at [facility] to provide your oncology and hematology care.  To afford each patient quality time with our provider, please arrive at least 15 minutes before your scheduled appointment time.    If you have a lab appointment with the [facility] please come in thru the  Main Entrance and check in at the main information desk  You need to re-schedule your appointment should you arrive 10 or more minutes late.  We strive to give you quality time with our providers, and arriving late affects you and other patients whose appointments are after yours.  Also, if you no show three or more times for appointments you may be dismissed from the clinic at the providers discretion.     Again, thank you for choosing [facility].  Our hope is that these requests will decrease the amount of time that you wait before being seen by our physicians.       _____________________________________________________________  Should you have questions after your visit to [facility], please contact our office at [phone] between the hours of 8:30 a.m. and 4:30 p.m.  Voicemails left after 4:30 p.m. will not be returned until the following business day.  For prescription refill requests, have your pharmacy contact our office.       Resources For Cancer Patients and their Caregivers ? American Cancer Society: Can assist with transportation, wigs, general needs, runs Look Good Feel Better.        ([phone] ? Cancer Care: Provides financial assistance, online support groups, medication/co-pay assistance.  1-800-813-HOPE 3313122161) ? [place] Assists [place] cancer patients and their families through emotional , educational and financial support.  828-698-9252 ? [place] DSS Where to apply for food stamps, Medicaid and utility assistance. [phone] ? RCATS: Transportation to medical appointments. (228)064-5626 ? Social Security Administration: May apply for disability if have a Stage IV cancer. [phone] 913-087-0869 ? [employer], Disability and Transit Services: Assists with nutrition, care and transit needs. [place] Support Programs: @10RELATIVEDAYS @ > Cancer Support Group  2nd Tuesday of the month 1pm-2pm, Journey Room  > Creative Journey  3rd Tuesday of the month 1130am-1pm, Journey Room  > Look Good Feel Better  1st Wednesday of the month 10am-12 noon, Journey Room (Call [place] to register [phone])

## 2016-09-29 ENCOUNTER — Encounter (HOSPITAL_BASED_OUTPATIENT_CLINIC_OR_DEPARTMENT_OTHER): Payer: Medicare HMO

## 2016-09-29 ENCOUNTER — Encounter (HOSPITAL_COMMUNITY): Payer: Self-pay

## 2016-09-29 ENCOUNTER — Encounter (HOSPITAL_COMMUNITY): Payer: Medicare HMO

## 2016-09-29 VITALS — BP 138/58 | HR 60 | Temp 97.9°F | Resp 18

## 2016-09-29 DIAGNOSIS — D464 Refractory anemia, unspecified: Secondary | ICD-10-CM

## 2016-09-29 DIAGNOSIS — D4621 Refractory anemia with excess of blasts 1: Secondary | ICD-10-CM | POA: Diagnosis not present

## 2016-09-29 DIAGNOSIS — D649 Anemia, unspecified: Secondary | ICD-10-CM | POA: Diagnosis not present

## 2016-09-29 LAB — CBC WITH DIFFERENTIAL/PLATELET
Basophils Absolute: 0 10*3/uL (ref 0.0–0.1)
Basophils Relative: 0 %
EOS ABS: 0 10*3/uL (ref 0.0–0.7)
Eosinophils Relative: 0 %
HCT: 22.3 % — ABNORMAL LOW (ref 39.0–52.0)
Hemoglobin: 7.3 g/dL — ABNORMAL LOW (ref 13.0–17.0)
LYMPHS ABS: 0.7 10*3/uL (ref 0.7–4.0)
Lymphocytes Relative: 42 %
MCH: 31.9 pg (ref 26.0–34.0)
MCHC: 32.7 g/dL (ref 30.0–36.0)
MCV: 97.4 fL (ref 78.0–100.0)
Monocytes Absolute: 0 10*3/uL — ABNORMAL LOW (ref 0.1–1.0)
Monocytes Relative: 2 %
NEUTROS ABS: 1 10*3/uL — AB (ref 1.7–7.7)
Neutrophils Relative %: 56 %
Platelets: 129 10*3/uL — ABNORMAL LOW (ref 150–400)
RBC: 2.29 MIL/uL — AB (ref 4.22–5.81)
RDW: 22.6 % — AB (ref 11.5–15.5)
WBC: 1.7 10*3/uL — AB (ref 4.0–10.5)
nRBC: 22 /100 WBC — ABNORMAL HIGH

## 2016-09-29 LAB — PREPARE RBC (CROSSMATCH)

## 2016-09-29 MED ORDER — ACETAMINOPHEN 325 MG PO TABS
650.0000 mg | ORAL_TABLET | Freq: Once | ORAL | Status: AC
  Administered 2016-09-29: 650 mg via ORAL
  Filled 2016-09-29: qty 2

## 2016-09-29 MED ORDER — DIPHENHYDRAMINE HCL 25 MG PO TABS
25.0000 mg | ORAL_TABLET | Freq: Once | ORAL | Status: AC
  Administered 2016-09-29: 25 mg via ORAL
  Filled 2016-09-29 (×2): qty 1

## 2016-09-29 MED ORDER — SODIUM CHLORIDE 0.9 % IV SOLN
250.0000 mL | Freq: Once | INTRAVENOUS | Status: AC
  Administered 2016-09-29: 250 mL via INTRAVENOUS

## 2016-09-29 MED ORDER — DARBEPOETIN ALFA 300 MCG/0.6ML IJ SOSY
300.0000 ug | PREFILLED_SYRINGE | Freq: Once | INTRAMUSCULAR | Status: AC
  Administered 2016-09-29: 300 ug via SUBCUTANEOUS
  Filled 2016-09-29: qty 0.6

## 2016-09-29 NOTE — Patient Instructions (Signed)
[place] at [facility] Discharge Instructions  RECOMMENDATIONS MADE BY THE CONSULTANT AND ANY TEST RESULTS WILL BE SENT TO YOUR REFERRING PHYSICIAN.  Received 2 units of blood and [name] injection today. Follow-up as scheduled. Call clinic for any questions or concerns  Thank you for choosing [place] at [facility] to provide your oncology and hematology care.  To afford each patient quality time with our provider, please arrive at least 15 minutes before your scheduled appointment time.    If you have a lab appointment with the [facility] please come in thru the  Main Entrance and check in at the main information desk  You need to re-schedule your appointment should you arrive 10 or more minutes late.  We strive to give you quality time with our providers, and arriving late affects you and other patients whose appointments are after yours.  Also, if you no show three or more times for appointments you may be dismissed from the clinic at the providers discretion.     Again, thank you for choosing [facility].  Our hope is that these requests will decrease the amount of time that you wait before being seen by our physicians.       _____________________________________________________________  Should you have questions after your visit to [facility], please contact our office at [phone] between the hours of 8:30 a.m. and 4:30 p.m.  Voicemails left after 4:30 p.m. will not be returned until the following business day.  For prescription refill requests, have your pharmacy contact our office.       Resources For Cancer Patients and their Caregivers ? American Cancer Society: Can assist with transportation, wigs, general needs, runs Look Good Feel Better.        [phone] ? Cancer Care: Provides financial assistance, online support groups, medication/co-pay assistance.  1-800-813-HOPE (539)418-0359) ? [place] Assists [place] cancer patients and their families through emotional , educational and financial support.  548-503-7462 ? [place] DSS Where to apply for food stamps, Medicaid and utility assistance. 225-264-7848 ? RCATS: Transportation to medical appointments. (509)842-9996 ? Social Security Administration: May apply for disability if have a Stage IV cancer. 5642203836 [phone] ? [employer], Disability and Transit Services: Assists with nutrition, care and transit needs. [place] Support Programs: @10RELATIVEDAYS @ > Cancer Support Group  2nd Tuesday of the month 1pm-2pm, Journey Room  > Creative Journey  3rd Tuesday of the month 1130am-1pm, Journey Room  > Look Good Feel Better  1st Wednesday of the month 10am-12 noon, Journey Room (Call [place] to register [phone])

## 2016-09-29 NOTE — Progress Notes (Signed)
Omar Lee tolerated blood transfusion and [name] injection well without complaints or incident.Labs including Hgb 7.3, reviewed with [name] NP and orders obtained for 2 units of blood to be transfused today VSS upon discharge. Pt discharged self ambulatory in satisfactory condition

## 2016-09-30 LAB — BPAM RBC
BLOOD PRODUCT EXPIRATION DATE: 201807102359
Blood Product Expiration Date: 201807102359
ISSUE DATE / TIME: 201806151130
ISSUE DATE / TIME: 201806151325
UNIT TYPE AND RH: 6200
Unit Type and Rh: 6200

## 2016-09-30 LAB — TYPE AND SCREEN
ABO/RH(D): A POS
ANTIBODY SCREEN: NEGATIVE
UNIT DIVISION: 0
UNIT DIVISION: 0

## 2016-10-03 ENCOUNTER — Other Ambulatory Visit (HOSPITAL_COMMUNITY): Payer: Self-pay

## 2016-10-03 DIAGNOSIS — D464 Refractory anemia, unspecified: Secondary | ICD-10-CM

## 2016-10-03 DIAGNOSIS — D649 Anemia, unspecified: Secondary | ICD-10-CM

## 2016-10-04 ENCOUNTER — Encounter (HOSPITAL_COMMUNITY): Payer: Medicare HMO

## 2016-10-04 DIAGNOSIS — D464 Refractory anemia, unspecified: Secondary | ICD-10-CM | POA: Diagnosis not present

## 2016-10-04 DIAGNOSIS — D649 Anemia, unspecified: Secondary | ICD-10-CM

## 2016-10-05 ENCOUNTER — Other Ambulatory Visit (HOSPITAL_COMMUNITY): Payer: Medicare HMO

## 2016-10-05 LAB — PREPARE RBC (CROSSMATCH)

## 2016-10-06 ENCOUNTER — Other Ambulatory Visit (HOSPITAL_COMMUNITY): Payer: Self-pay | Admitting: *Deleted

## 2016-10-06 ENCOUNTER — Encounter (HOSPITAL_COMMUNITY): Payer: Medicare HMO

## 2016-10-06 ENCOUNTER — Encounter (HOSPITAL_COMMUNITY): Payer: Medicare HMO | Attending: Oncology

## 2016-10-06 ENCOUNTER — Ambulatory Visit (HOSPITAL_COMMUNITY): Payer: Medicare HMO

## 2016-10-06 ENCOUNTER — Other Ambulatory Visit (HOSPITAL_COMMUNITY): Payer: Self-pay | Admitting: Adult Health

## 2016-10-06 ENCOUNTER — Other Ambulatory Visit (HOSPITAL_COMMUNITY): Payer: Medicare HMO

## 2016-10-06 VITALS — BP 139/70 | HR 73 | Temp 98.8°F | Resp 18

## 2016-10-06 DIAGNOSIS — D72819 Decreased white blood cell count, unspecified: Secondary | ICD-10-CM | POA: Insufficient documentation

## 2016-10-06 DIAGNOSIS — D649 Anemia, unspecified: Secondary | ICD-10-CM

## 2016-10-06 DIAGNOSIS — Z9889 Other specified postprocedural states: Secondary | ICD-10-CM | POA: Insufficient documentation

## 2016-10-06 DIAGNOSIS — D464 Refractory anemia, unspecified: Secondary | ICD-10-CM | POA: Diagnosis not present

## 2016-10-06 DIAGNOSIS — D539 Nutritional anemia, unspecified: Secondary | ICD-10-CM | POA: Insufficient documentation

## 2016-10-06 DIAGNOSIS — N132 Hydronephrosis with renal and ureteral calculous obstruction: Secondary | ICD-10-CM | POA: Insufficient documentation

## 2016-10-06 DIAGNOSIS — D4621 Refractory anemia with excess of blasts 1: Secondary | ICD-10-CM

## 2016-10-06 LAB — CBC WITH DIFFERENTIAL/PLATELET
BASOS PCT: 0 %
Basophils Absolute: 0 10*3/uL (ref 0.0–0.1)
Eosinophils Absolute: 0 10*3/uL (ref 0.0–0.7)
Eosinophils Relative: 0 %
HEMATOCRIT: 27.3 % — AB (ref 39.0–52.0)
Hemoglobin: 9.1 g/dL — ABNORMAL LOW (ref 13.0–17.0)
LYMPHS ABS: 0.9 10*3/uL (ref 0.7–4.0)
Lymphocytes Relative: 57 %
MCH: 31.2 pg (ref 26.0–34.0)
MCHC: 33.3 g/dL (ref 30.0–36.0)
MCV: 93.5 fL (ref 78.0–100.0)
MONOS PCT: 1 %
Monocytes Absolute: 0 10*3/uL — ABNORMAL LOW (ref 0.1–1.0)
NEUTROS ABS: 0.6 10*3/uL — AB (ref 1.7–7.7)
Neutrophils Relative %: 42 %
Platelets: 104 10*3/uL — ABNORMAL LOW (ref 150–400)
RBC: 2.92 MIL/uL — AB (ref 4.22–5.81)
RDW: 21.2 % — ABNORMAL HIGH (ref 11.5–15.5)
WBC: 1.5 10*3/uL — ABNORMAL LOW (ref 4.0–10.5)

## 2016-10-06 MED ORDER — SODIUM CHLORIDE 0.9% FLUSH
10.0000 mL | INTRAVENOUS | Status: DC | PRN
Start: 2016-10-06 — End: 2017-12-25

## 2016-10-06 MED ORDER — DARBEPOETIN ALFA 300 MCG/0.6ML IJ SOSY
300.0000 ug | PREFILLED_SYRINGE | Freq: Once | INTRAMUSCULAR | Status: AC
  Administered 2016-10-06: 300 ug via SUBCUTANEOUS

## 2016-10-06 MED ORDER — DARBEPOETIN ALFA 300 MCG/0.6ML IJ SOSY
PREFILLED_SYRINGE | INTRAMUSCULAR | Status: AC
  Filled 2016-10-06: qty 0.6

## 2016-10-06 NOTE — Progress Notes (Signed)
Omar Lee presents today for injection per MD orders. [name] 313mcg administered SQ in right Abdomen. Administration without incident. Patient tolerated well.

## 2016-10-08 LAB — TYPE AND SCREEN
ABO/RH(D): A POS
Antibody Screen: NEGATIVE
UNIT DIVISION: 0
UNIT DIVISION: 0

## 2016-10-08 LAB — BPAM RBC
Blood Product Expiration Date: 201807102359
Blood Product Expiration Date: 201807102359
ISSUE DATE / TIME: 201806231452
Unit Type and Rh: 6200
Unit Type and Rh: 6200

## 2016-10-13 ENCOUNTER — Other Ambulatory Visit (HOSPITAL_COMMUNITY): Payer: Self-pay

## 2016-10-13 ENCOUNTER — Ambulatory Visit (HOSPITAL_COMMUNITY): Payer: Medicare HMO

## 2016-10-13 ENCOUNTER — Encounter (HOSPITAL_BASED_OUTPATIENT_CLINIC_OR_DEPARTMENT_OTHER): Payer: Medicare HMO

## 2016-10-13 ENCOUNTER — Encounter (HOSPITAL_COMMUNITY): Payer: Medicare HMO

## 2016-10-13 ENCOUNTER — Telehealth (HOSPITAL_COMMUNITY): Payer: Self-pay

## 2016-10-13 VITALS — BP 139/51 | HR 67 | Temp 97.9°F | Resp 18

## 2016-10-13 DIAGNOSIS — Z9889 Other specified postprocedural states: Secondary | ICD-10-CM | POA: Diagnosis not present

## 2016-10-13 DIAGNOSIS — D464 Refractory anemia, unspecified: Secondary | ICD-10-CM

## 2016-10-13 DIAGNOSIS — D649 Anemia, unspecified: Secondary | ICD-10-CM

## 2016-10-13 DIAGNOSIS — D539 Nutritional anemia, unspecified: Secondary | ICD-10-CM | POA: Diagnosis not present

## 2016-10-13 DIAGNOSIS — D72819 Decreased white blood cell count, unspecified: Secondary | ICD-10-CM | POA: Diagnosis not present

## 2016-10-13 DIAGNOSIS — N132 Hydronephrosis with renal and ureteral calculous obstruction: Secondary | ICD-10-CM | POA: Diagnosis not present

## 2016-10-13 DIAGNOSIS — D4621 Refractory anemia with excess of blasts 1: Secondary | ICD-10-CM

## 2016-10-13 LAB — CBC WITH DIFFERENTIAL/PLATELET
BASOS ABS: 0 10*3/uL (ref 0.0–0.1)
Basophils Relative: 0 %
Eosinophils Absolute: 0 10*3/uL (ref 0.0–0.7)
Eosinophils Relative: 0 %
HCT: 24.7 % — ABNORMAL LOW (ref 39.0–52.0)
HEMOGLOBIN: 8.3 g/dL — AB (ref 13.0–17.0)
LYMPHS ABS: 0.8 10*3/uL (ref 0.7–4.0)
LYMPHS PCT: 42 %
MCH: 31.6 pg (ref 26.0–34.0)
MCHC: 33.6 g/dL (ref 30.0–36.0)
MCV: 93.9 fL (ref 78.0–100.0)
MONO ABS: 0 10*3/uL — AB (ref 0.1–1.0)
Monocytes Relative: 0 %
NEUTROS ABS: 1.1 10*3/uL — AB (ref 1.7–7.7)
Neutrophils Relative %: 58 %
PLATELETS: 122 10*3/uL — AB (ref 150–400)
RBC: 2.63 MIL/uL — ABNORMAL LOW (ref 4.22–5.81)
RDW: 21.3 % — AB (ref 11.5–15.5)
WBC: 1.8 10*3/uL — ABNORMAL LOW (ref 4.0–10.5)

## 2016-10-13 LAB — SAMPLE TO BLOOD BANK

## 2016-10-13 MED ORDER — DARBEPOETIN ALFA 300 MCG/0.6ML IJ SOSY
300.0000 ug | PREFILLED_SYRINGE | Freq: Once | INTRAMUSCULAR | Status: AC
  Administered 2016-10-13: 300 ug via SUBCUTANEOUS

## 2016-10-13 MED ORDER — DARBEPOETIN ALFA 300 MCG/0.6ML IJ SOSY
PREFILLED_SYRINGE | INTRAMUSCULAR | Status: AC
  Filled 2016-10-13: qty 0.6

## 2016-10-13 NOTE — Telephone Encounter (Signed)
Patients HGB is 8.3. Reviewed with Dr. Talbert Cage who said not to transfuse patient Monday. Called patient on home and cell numbers and left messages on both that he will not need a transfusion Monday and he can cut the blue bracelet off. Call cancer center if any questions.

## 2016-10-13 NOTE — Progress Notes (Signed)
Omar Lee presents today for injection per MD orders. [name] 300 mcg administered SQ in left Abdomen. Administration without incident. Patient tolerated well. Stable and ambulatory on discharge home to self.

## 2016-10-13 NOTE — Patient Instructions (Signed)
[place] at [facility] Discharge Instructions  RECOMMENDATIONS MADE BY THE CONSULTANT AND ANY TEST RESULTS WILL BE SENT TO YOUR REFERRING PHYSICIAN.  Hemoglobin 8.3 today. [name] 300 mcg injection given as ordered. Return as scheduled.  Thank you for choosing [place] at [facility] to provide your oncology and hematology care.  To afford each patient quality time with our provider, please arrive at least 15 minutes before your scheduled appointment time.    If you have a lab appointment with the [facility] [facility] please come in thru the  Main Entrance and check in at the main information desk  You need to re-schedule your appointment should you arrive 10 or more minutes late.  We strive to give you quality time with our providers, and arriving late affects you and other patients whose appointments are after yours.  Also, if you no show three or more times for appointments you may be dismissed from the clinic at the providers discretion.     Again, thank you for choosing [facility].  Our hope is that these requests will decrease the amount of time that you wait before being seen by our physicians.       _____________________________________________________________  Should you have questions after your visit to [facility], please contact our office at [phone] between the hours of 8:30 a.m. and 4:30 p.m.  Voicemails left after 4:30 p.m. will not be returned until the following business day.  For prescription refill requests, have your pharmacy contact our office.       Resources For Cancer Patients and their Caregivers ? American Cancer Society: Can assist with transportation, wigs, general needs, runs Look Good Feel Better.        218-828-7634 ? Cancer Care: Provides financial assistance, online support groups, medication/co-pay assistance.  1-800-813-HOPE (581) 657-3469) ? [place] Assists [place] cancer patients and their families through emotional , educational and financial support.  3066103674 ? [place] DSS Where to apply for food stamps, Medicaid and utility assistance. 484-531-9006 ? RCATS: Transportation to medical appointments. 803-085-4745 ? Social Security Administration: May apply for disability if have a Stage IV cancer. 605-643-8136 [phone] ? [employer], Disability and Transit Services: Assists with nutrition, care and transit needs. [place] Support Programs: @10RELATIVEDAYS @ > Cancer Support Group  2nd Tuesday of the month 1pm-2pm, Journey Room  > Creative Journey  3rd Tuesday of the month 1130am-1pm, Journey Room  > Look Good Feel Better  1st Wednesday of the month 10am-12 noon, Journey Room (Call [place] to register [phone])

## 2016-10-20 ENCOUNTER — Encounter (HOSPITAL_COMMUNITY): Payer: Self-pay

## 2016-10-20 ENCOUNTER — Encounter (HOSPITAL_BASED_OUTPATIENT_CLINIC_OR_DEPARTMENT_OTHER): Payer: Medicare HMO

## 2016-10-20 ENCOUNTER — Encounter (HOSPITAL_COMMUNITY): Payer: Medicare HMO | Attending: Oncology

## 2016-10-20 ENCOUNTER — Encounter (HOSPITAL_BASED_OUTPATIENT_CLINIC_OR_DEPARTMENT_OTHER): Payer: Medicare HMO | Admitting: Oncology

## 2016-10-20 VITALS — BP 140/58 | HR 84 | Resp 16 | Ht 67.0 in | Wt 189.0 lb

## 2016-10-20 DIAGNOSIS — D4621 Refractory anemia with excess of blasts 1: Secondary | ICD-10-CM | POA: Diagnosis not present

## 2016-10-20 DIAGNOSIS — D464 Refractory anemia, unspecified: Secondary | ICD-10-CM

## 2016-10-20 DIAGNOSIS — D649 Anemia, unspecified: Secondary | ICD-10-CM | POA: Diagnosis present

## 2016-10-20 LAB — CBC WITH DIFFERENTIAL/PLATELET
Basophils Absolute: 0 10*3/uL (ref 0.0–0.1)
Basophils Relative: 0 %
Eosinophils Absolute: 0 10*3/uL (ref 0.0–0.7)
Eosinophils Relative: 0 %
HCT: 24.7 % — ABNORMAL LOW (ref 39.0–52.0)
Hemoglobin: 8.2 g/dL — ABNORMAL LOW (ref 13.0–17.0)
Lymphocytes Relative: 43 %
Lymphs Abs: 0.9 10*3/uL (ref 0.7–4.0)
MCH: 31.3 pg (ref 26.0–34.0)
MCHC: 33.2 g/dL (ref 30.0–36.0)
MCV: 94.3 fL (ref 78.0–100.0)
Monocytes Absolute: 0 10*3/uL — ABNORMAL LOW (ref 0.1–1.0)
Monocytes Relative: 2 %
Neutro Abs: 1.2 10*3/uL — ABNORMAL LOW (ref 1.7–7.7)
Neutrophils Relative %: 55 %
Platelets: 142 10*3/uL — ABNORMAL LOW (ref 150–400)
RBC: 2.62 MIL/uL — ABNORMAL LOW (ref 4.22–5.81)
RDW: 21.9 % — ABNORMAL HIGH (ref 11.5–15.5)
WBC: 2.2 10*3/uL — ABNORMAL LOW (ref 4.0–10.5)

## 2016-10-20 LAB — SAMPLE TO BLOOD BANK

## 2016-10-20 MED ORDER — DARBEPOETIN ALFA 300 MCG/0.6ML IJ SOSY
300.0000 ug | PREFILLED_SYRINGE | Freq: Once | INTRAMUSCULAR | Status: AC
  Administered 2016-10-20: 300 ug via SUBCUTANEOUS
  Filled 2016-10-20: qty 0.6

## 2016-10-20 NOTE — Patient Instructions (Signed)
[facility] at [facility] Discharge Instructions  RECOMMENDATIONS MADE BY THE CONSULTANT AND ANY TEST RESULTS WILL BE SENT TO YOUR REFERRING PHYSICIAN.  Received [name] injection today. Follow-up as scheduled. Call clinic for any questions or concerns  Thank you for choosing [facility] at [facility] to provide your oncology and hematology care.  To afford each patient quality time with our provider, please arrive at least 15 minutes before your scheduled appointment time.    If you have a lab appointment with the Cancer Center please come in thru the  Main Entrance and check in at the main information desk  You need to re-schedule your appointment should you arrive 10 or more minutes late.  We strive to give you quality time with our providers, and arriving late affects you and other patients whose appointments are after yours.  Also, if you no show three or more times for appointments you may be dismissed from the clinic at the providers discretion.     Again, thank you for choosing [facility].  Our hope is that these requests will decrease the amount of time that you wait before being seen by our physicians.       _____________________________________________________________  Should you have questions after your visit to [facility], please contact our office at [phone] between the hours of 8:30 a.m. and 4:30 p.m.  Voicemails left after 4:30 p.m. will not be returned until the following business day.  For prescription refill requests, have your pharmacy contact our office.       Resources For Cancer Patients and their Caregivers ? American Cancer Society: Can assist with transportation, wigs, general needs, runs Look Good Feel Better.        1-888-227-6333 ? Cancer Care: Provides financial assistance, online support groups, medication/co-pay assistance.  1-800-813-HOPE (4673) ? [facility] Assists [place] cancer patients and their families through emotional , educational and financial support.  336-427-4357 ? [place] DSS Where to apply for food stamps, Medicaid and utility assistance. 336-342-1394 ? RCATS: Transportation to medical appointments. 336-347-2287 ? Social Security Administration: May apply for disability if have a Stage IV cancer. 336-342-7796 1-800-772-1213 ? [place] Aging, Disability and Transit Services: Assists with nutrition, care and transit needs. 336-349-2343  Cancer Center Support Programs: @10RELATIVEDAYS@ > Cancer Support Group  2nd Tuesday of the month 1pm-2pm, Journey Room  > Creative Journey  3rd Tuesday of the month 1130am-1pm, Journey Room  > Look Good Feel Better  1st Wednesday of the month 10am-12 noon, Journey Room (Call [employer] to register [phone])   

## 2016-10-20 NOTE — Progress Notes (Signed)
Omar Lee tolerated [name] injection well without complaints or incident. Hgb 8.3 today. Pt discharged self ambulatory in satisfactory condition accompanied by family member

## 2016-10-20 NOTE — Progress Notes (Signed)
[facility]  PROGRESS NOTE  Patient Care Team: [name], MD as PCP - General (Family Medicine)  CHIEF COMPLAINTS/PURPOSE OF CONSULTATION:  Leukopenia Anemia  HISTORY OF PRESENTING ILLNESS:  Omar Lee 76 y.o. male is here for follow up of severe anemia. Since his last visit he has undergone a bone marrow biopsy on 07/28/16. He is accompanied by his neighbor/friend, Laurine Blazer, who helps him manage his medical care.  Patient states he's about the same. Continues to have fatigue which limits what he can do, however he tries to stay active daily. His appetite is poor but he forces himself to eat. Denies recent infections, chest pain, SOB, abdominal pain, dysuria, or any other concerns.   MEDICAL HISTORY:  Past Medical History:  Diagnosis Date  . Hyperlipidemia   . Hypothyroidism   . Reflux   . Sleep apnea     SURGICAL HISTORY: Past Surgical History:  Procedure Laterality Date  . ESOPHAGOGASTRODUODENOSCOPY (EGD) WITH ESOPHAGEAL DILATION N/A 11/22/2012   Procedure: ESOPHAGOGASTRODUODENOSCOPY (EGD) WITH ESOPHAGEAL DILATION;  Surgeon: Rogene Houston, MD;  Location: [facility];  Service: Endoscopy;  Laterality: N/A;  1120  . HEMORRHOID SURGERY    . NASAL SEPTUM SURGERY    . SHOULDER SURGERY Right   . TONSILLECTOMY AND ADENOIDECTOMY      SOCIAL HISTORY: Social History   Social History  . Marital status: Divorced    Spouse name: N/A  . Number of children: N/A  . Years of education: N/A   Occupational History  . Not on file.   Social History Main Topics  . Smoking status: Never Smoker  . Smokeless tobacco: Never Used  . Alcohol use No  . Drug use: No  . Sexual activity: Not Currently    Birth control/ protection: None   Other Topics Concern  . Not on file   Social History Narrative  . No narrative on file  Never smoker Never drinker Lives alone  FAMILY HISTORY: Family History  Problem Relation Age of Onset  . Hypertension  Mother   . Heart attack Mother   . Heart attack Father   . Diabetes Brother     ALLERGIES:  has No Known Allergies.  MEDICATIONS:  Current Outpatient Prescriptions  Medication Sig Dispense Refill  . levothyroxine (SYNTHROID, LEVOTHROID) 112 MCG tablet Take 1 tablet (112 mcg total) by mouth daily. 90 tablet 3   No current facility-administered medications for this visit.    Facility-Administered Medications Ordered in Other Visits  Medication Dose Route Frequency Provider Last Rate Last Dose  . 0.9 %  sodium chloride infusion  250 mL Intravenous Once Twana First, MD      . acetaminophen (TYLENOL) tablet 650 mg  650 mg Oral Once Twana First, MD      . diphenhydrAMINE (BENADRYL) capsule 25 mg  25 mg Oral Once Twana First, MD      . sodium chloride flush (NS) 0.9 % injection 10 mL  10 mL Intracatheter PRN Holley Bouche, NP      . sodium chloride flush (NS) 0.9 % injection 10 mL  10 mL Intracatheter PRN Twana First, MD      . sodium chloride flush (NS) 0.9 % injection 10 mL  10 mL Intracatheter PRN Holley Bouche, NP       Review of Systems  Constitutional: Positive for malaise/fatigue.       No recent infections  HENT: Negative.   Eyes: Negative.   Respiratory: Negative.  Negative for shortness of breath.   Cardiovascular: Negative for chest pain and palpitations.  Gastrointestinal: Negative.  Negative for abdominal pain.  Genitourinary: Negative.  Negative for dysuria.  Musculoskeletal: Negative.   Skin: Negative.   Neurological: Negative.   Endo/Heme/Allergies: Negative.   Psychiatric/Behavioral: The patient does not have insomnia.   All other systems reviewed and are negative. 14 point ROS was done and is otherwise as detailed above or in HPI  PHYSICAL EXAMINATION: ECOG PERFORMANCE STATUS: 1 - Symptomatic but completely ambulatory   Vitals:   10/20/16 1032  BP: (!) 140/58  Pulse: 84  Resp: 16   Physical Exam  Constitutional: He is oriented to person,  place, and time and well-developed, well-nourished, and in no distress. No distress.  HENT:  Head: Normocephalic and atraumatic.  Mouth/Throat: Oropharynx is clear and moist. No oropharyngeal exudate.  Eyes: Conjunctivae and EOM are normal. Pupils are equal, round, and reactive to light. No scleral icterus.  Neck: Normal range of motion. Neck supple. No JVD present.  Cardiovascular: Normal rate, regular rhythm and normal heart sounds.  Exam reveals no gallop and no friction rub.   No murmur heard. Pulmonary/Chest: Effort normal and breath sounds normal. No respiratory distress. He has no wheezes. He has no rales.  Abdominal: Soft. Bowel sounds are normal. He exhibits no distension. There is no tenderness. There is no guarding.  Abdominal hernia -reducible, nontender  Musculoskeletal: Normal range of motion. He exhibits no edema or tenderness.  Lymphadenopathy:    He has no cervical adenopathy.  Neurological: He is alert and oriented to person, place, and time. No cranial nerve deficit. Gait normal.  Skin: Skin is warm and dry. No rash noted. No erythema. No pallor.  Psychiatric: Affect and judgment normal.  Nursing note and vitals reviewed.  LABORATORY DATA:  I have reviewed the data as listed Lab Results  Component Value Date   WBC 2.2 (L) 10/20/2016   HGB 8.2 (L) 10/20/2016   HCT 24.7 (L) 10/20/2016   MCV 94.3 10/20/2016   PLT 142 (L) 10/20/2016   CMP     Component Value Date/Time   NA 137 09/08/2016 0953   NA 143 06/23/2016 0825   K 3.6 09/08/2016 0953   CL 105 09/08/2016 0953   CO2 23 09/08/2016 0953   GLUCOSE 110 (H) 09/08/2016 0953   BUN 19 09/08/2016 0953   BUN 16 06/23/2016 0825   CREATININE 1.09 09/08/2016 0953   CALCIUM 8.7 (L) 09/08/2016 0953   PROT 6.6 09/08/2016 0953   PROT 6.4 06/23/2016 0825   ALBUMIN 4.2 09/08/2016 0953   ALBUMIN 4.5 06/23/2016 0825   AST 29 09/08/2016 0953   ALT 21 09/08/2016 0953   ALKPHOS 42 09/08/2016 0953   BILITOT 1.3 (H)  09/08/2016 0953   BILITOT 1.4 (H) 06/23/2016 0825   GFRNONAA >60 09/08/2016 0953   GFRAA >60 09/08/2016 0017   PATHOLOGY:     RADIOLOGY: I have reviewed the images below and agree with the reported results  CT Abdomen Pelvis w Contrast 08/03/2016 IMPRESSION: Punctate nonobstructing right lower pole renal calculus.  Bilateral renal sinus cysts.  No hydronephrosis.  No suspicious abdominopelvic lymphadenopathy. Spleen is normal in size.  US Abdomen 07/25/2016 IMPRESSION: BILATERAL mild to moderate hydronephrosis of uncertain etiology.  Further evaluation by CT imaging with and without contrast recommended.  Small gallbladder polyps largest 8 mm diameter ; followup ultrasound recommended in 1 year to assess stability in order to exclude malignancy.  ASSESSMENT & PLAN:  1.  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 Molecular cytogenetics normal, no evidence of deletion 5q.  Patient's hemoglobin is a 8.2 g/dL today. Its been holding steady in the 8 g/dL range. Platelets and WBCs are stable. Continue weekly aranesp.  Return to clinic weekly for CBC, CMP and [name] injections.  He will return for follow up in 8 weeks with labs.     All questions were answered. The patient knows to call the clinic with any problems, questions or concerns.   This note was electronically signed.    Twana First, MD  10/20/2016 10:52 AM

## 2016-10-24 ENCOUNTER — Encounter (HOSPITAL_COMMUNITY): Payer: Self-pay

## 2016-10-24 ENCOUNTER — Emergency Department (HOSPITAL_COMMUNITY)
Admission: EM | Admit: 2016-10-24 | Discharge: 2016-10-24 | Disposition: A | Discharge status: Home / Self Care | Payer: Medicare HMO | Attending: Emergency Medicine | Admitting: Emergency Medicine

## 2016-10-24 DIAGNOSIS — Z859 Personal history of malignant neoplasm, unspecified: Secondary | ICD-10-CM | POA: Diagnosis not present

## 2016-10-24 DIAGNOSIS — E039 Hypothyroidism, unspecified: Secondary | ICD-10-CM | POA: Insufficient documentation

## 2016-10-24 DIAGNOSIS — M79622 Pain in left upper arm: Secondary | ICD-10-CM | POA: Diagnosis not present

## 2016-10-24 DIAGNOSIS — M79602 Pain in left arm: Secondary | ICD-10-CM | POA: Diagnosis not present

## 2016-10-24 DIAGNOSIS — Z79899 Other long term (current) drug therapy: Secondary | ICD-10-CM | POA: Diagnosis not present

## 2016-10-24 HISTORY — DX: Malignant (primary) neoplasm, unspecified: C80.1

## 2016-10-24 MED ORDER — HYDROCODONE-ACETAMINOPHEN 5-325 MG PO TABS: 1.0000 | ORAL_TABLET | Freq: Four times a day (QID) | ORAL | 0 refills | Status: DC | PRN

## 2016-10-24 MED ORDER — PREDNISONE 10 MG PO TABS: 10.0000 mg | ORAL_TABLET | Freq: Every day | ORAL | 0 refills | Status: DC

## 2016-10-24 NOTE — Discharge Instructions (Signed)
You have strained your muscles, tendons, ligaments of your upper arm and shoulder. Recommend ice, minimizing activity, medication for pain and prednisone.  Follow-up your primary care doctor.

## 2016-10-24 NOTE — ED Triage Notes (Signed)
Pt reports left arm pain that radiates into shoulder. Pain started yesterday when he pushed himself up from a seated position

## 2016-10-24 NOTE — ED Provider Notes (Signed)
[facility] DEPT Provider Note   CSN: [id number] Arrival date & time: 10/24/16  1450     History   Chief Complaint Chief Complaint  Patient presents with  . Arm Pain    HPI Omar Lee is a 76 y.o. male.  Patient reports pain in the proximal aspect of his medial left humerus radiating to the anterior aspect of his shoulder since yesterday after awkwardly sitting down and flexing his shoulder joint. No other injuries. Severity of pain is mild to moderate. Palpation and range of motion make pain worse. Past medical history includes myelodysplastic syndrome. His last hemoglobin was 8.2.      Past Medical History:  Diagnosis Date  . Cancer ([place])    MDS  . Hyperlipidemia   . Hypothyroidism   . Reflux   . Sleep apnea     Patient Active Problem List   Diagnosis Date Noted  . 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 Anemia 07/21/2016  . GERD (gastroesophageal reflux disease) 01/20/2013  . Barrett's esophagus with esophagitis 01/20/2013  . Hypothyroidism 09/27/2012    Past Surgical History:  Procedure Laterality Date  . ESOPHAGOGASTRODUODENOSCOPY (EGD) WITH ESOPHAGEAL DILATION N/A 11/22/2012   Procedure: ESOPHAGOGASTRODUODENOSCOPY (EGD) WITH ESOPHAGEAL DILATION;  Surgeon: Rogene Houston, MD;  Location[facility];  Service: Endoscopy;  Laterality: N/A;  1120  . HEMORRHOID SURGERY    . NASAL SEPTUM SURGERY    . SHOULDER SURGERY Right   . TONSILLECTOMY AND ADENOIDECTOMY         Home Medications    Prior to Admission medications   Medication Sig Start Date End Date Taking? Authorizing Provider  HYDROcodone-acetaminophen (NORCO/VICODIN) 5-325 MG tablet Take 1 tablet by mouth every 6 (six) hours as needed. 10/24/16   Nat Christen, MD  levothyroxine (SYNTHROID, LEVOTHROID) 112 MCG tablet Take 1 tablet (112 mcg total) by mouth daily. 06/27/16   [name], MD  predniSONE (DELTASONE) 10 MG tablet Take 1 tablet (10 mg total) by  mouth daily with breakfast. 10/24/16   Nat Christen, MD    Family History Family History  Problem Relation Age of Onset  . Hypertension Mother   . Heart attack Mother   . Heart attack Father   . Diabetes Brother     Social History Social History  Substance Use Topics  . Smoking status: Never Smoker  . Smokeless tobacco: Never Used  . Alcohol use No     Allergies   Patient has no known allergies.   Review of Systems Review of Systems  All other systems reviewed and are negative.    Physical Exam Updated Vital Signs BP 139/64 (BP Location: Right Arm)   Pulse 82   Temp 98 F (36.7 C) (Oral)   Resp 17   Ht 5\' 7"  (1.702 m)   Wt 88 kg (194 lb)   SpO2 99%   BMI 30.38 kg/m   Physical Exam  Constitutional: He is oriented to person, place, and time. He appears well-developed and well-nourished.  HENT:  Head: Normocephalic and atraumatic.  Eyes: Conjunctivae are normal.  Neck: Neck supple.  Musculoskeletal:  Left upper extremity: Tender in the medial proximal bicep.  Pain is worse with flexion. Neck nontender.  Neurological: He is alert and oriented to person, place, and time.  Skin: Skin is warm and dry.  Psychiatric: He has a normal mood and affect. His behavior is normal.  Nursing note and vitals reviewed.    ED Treatments / Results  Labs (all labs ordered are listed, but only abnormal results are displayed) Labs Reviewed - No data to display  EKG  EKG Interpretation None       Radiology No results found.  Procedures Procedures (including critical care time)  Medications Ordered in ED Medications - No data to display   Initial Impression / Assessment and Plan / ED Course  I have reviewed the triage vital signs and the nursing notes.  Pertinent labs & imaging results that were available during my care of the patient were reviewed by me and considered in my medical decision making (see chart for details).     Patient has musculoskeletal pain  secondary to an injury yesterday. No imaging required. Discharge medications prednisone 10 mg and Vicodin. This was discussed with the patient and his friend.  Final Clinical Impressions(s) / ED Diagnoses   Final diagnoses:  Musculoskeletal pain of left upper extremity    New Prescriptions New Prescriptions   HYDROCODONE-ACETAMINOPHEN (NORCO/VICODIN) 5-325 MG TABLET    Take 1 tablet by mouth every 6 (six) hours as needed.   PREDNISONE (DELTASONE) 10 MG TABLET    Take 1 tablet (10 mg total) by mouth daily with breakfast.     Nat Christen, MD 10/24/16 1527

## 2016-10-27 ENCOUNTER — Encounter (HOSPITAL_COMMUNITY): Payer: Medicare HMO

## 2016-10-27 ENCOUNTER — Encounter (HOSPITAL_COMMUNITY): Payer: Self-pay

## 2016-10-27 ENCOUNTER — Encounter (HOSPITAL_BASED_OUTPATIENT_CLINIC_OR_DEPARTMENT_OTHER): Payer: Medicare HMO

## 2016-10-27 VITALS — BP 121/54 | HR 77 | Temp 98.4°F | Resp 20

## 2016-10-27 DIAGNOSIS — D464 Refractory anemia, unspecified: Secondary | ICD-10-CM

## 2016-10-27 DIAGNOSIS — D649 Anemia, unspecified: Secondary | ICD-10-CM

## 2016-10-27 DIAGNOSIS — D4621 Refractory anemia with excess of blasts 1: Secondary | ICD-10-CM | POA: Diagnosis not present

## 2016-10-27 LAB — CBC WITH DIFFERENTIAL/PLATELET
Basophils Absolute: 0 10*3/uL (ref 0.0–0.1)
Basophils Relative: 0 %
EOS ABS: 0 10*3/uL (ref 0.0–0.7)
EOS PCT: 0 %
HCT: 21.5 % — ABNORMAL LOW (ref 39.0–52.0)
Hemoglobin: 7.1 g/dL — ABNORMAL LOW (ref 13.0–17.0)
LYMPHS ABS: 0.8 10*3/uL (ref 0.7–4.0)
Lymphocytes Relative: 51 %
MCH: 31.6 pg (ref 26.0–34.0)
MCHC: 33 g/dL (ref 30.0–36.0)
MCV: 95.6 fL (ref 78.0–100.0)
MONOS PCT: 1 %
Monocytes Absolute: 0 10*3/uL — ABNORMAL LOW (ref 0.1–1.0)
NEUTROS PCT: 48 %
Neutro Abs: 0.8 10*3/uL — ABNORMAL LOW (ref 1.7–7.7)
PLATELETS: 122 10*3/uL — AB (ref 150–400)
RBC: 2.25 MIL/uL — ABNORMAL LOW (ref 4.22–5.81)
RDW: 22.8 % — ABNORMAL HIGH (ref 11.5–15.5)
WBC: 1.6 10*3/uL — ABNORMAL LOW (ref 4.0–10.5)

## 2016-10-27 LAB — SAMPLE TO BLOOD BANK

## 2016-10-27 MED ORDER — DARBEPOETIN ALFA 300 MCG/0.6ML IJ SOSY
300.0000 ug | PREFILLED_SYRINGE | Freq: Once | INTRAMUSCULAR | Status: AC
  Administered 2016-10-27: 300 ug via SUBCUTANEOUS

## 2016-10-27 MED ORDER — DARBEPOETIN ALFA 300 MCG/0.6ML IJ SOSY
PREFILLED_SYRINGE | INTRAMUSCULAR | Status: AC
Start: 2016-10-27 — End: ?
  Filled 2016-10-27: qty 0.6

## 2016-10-27 NOTE — Patient Instructions (Signed)
[facility] at [facility] Discharge Instructions  RECOMMENDATIONS MADE BY THE CONSULTANT AND ANY TEST RESULTS WILL BE SENT TO YOUR REFERRING PHYSICIAN.  Received [name] injection today. Follow-up as scheduled. Call clinic for any questions or concerns  Thank you for choosing [facility] at [facility] to provide your oncology and hematology care.  To afford each patient quality time with our provider, please arrive at least 15 minutes before your scheduled appointment time.    If you have a lab appointment with the Cancer Center please come in thru the  Main Entrance and check in at the main information desk  You need to re-schedule your appointment should you arrive 10 or more minutes late.  We strive to give you quality time with our providers, and arriving late affects you and other patients whose appointments are after yours.  Also, if you no show three or more times for appointments you may be dismissed from the clinic at the providers discretion.     Again, thank you for choosing [facility].  Our hope is that these requests will decrease the amount of time that you wait before being seen by our physicians.       _____________________________________________________________  Should you have questions after your visit to [facility], please contact our office at [phone] between the hours of 8:30 a.m. and 4:30 p.m.  Voicemails left after 4:30 p.m. will not be returned until the following business day.  For prescription refill requests, have your pharmacy contact our office.       Resources For Cancer Patients and their Caregivers ? American Cancer Society: Can assist with transportation, wigs, general needs, runs Look Good Feel Better.        1-888-227-6333 ? Cancer Care: Provides financial assistance, online support groups, medication/co-pay assistance.  1-800-813-HOPE (4673) ? [facility] Assists [place] cancer patients and their families through emotional , educational and financial support.  336-427-4357 ? [place] DSS Where to apply for food stamps, Medicaid and utility assistance. 336-342-1394 ? RCATS: Transportation to medical appointments. 336-347-2287 ? Social Security Administration: May apply for disability if have a Stage IV cancer. 336-342-7796 1-800-772-1213 ? [place] Aging, Disability and Transit Services: Assists with nutrition, care and transit needs. 336-349-2343  Cancer Center Support Programs: @10RELATIVEDAYS@ > Cancer Support Group  2nd Tuesday of the month 1pm-2pm, Journey Room  > Creative Journey  3rd Tuesday of the month 1130am-1pm, Journey Room  > Look Good Feel Better  1st Wednesday of the month 10am-12 noon, Journey Room (Call American Cancer [employer] to register 1-800-395-5775)   

## 2016-10-27 NOTE — Progress Notes (Signed)
Darcel Smalling tolerated [name] injection well without complaints or incident. Labs including Hgb 7.1 , reviewed with Dr. Oliva Bustard as well as pt is feeling a little weaker today than he felt last week. Orders obtained to give [name] injection today and have pt repeat labs including CBCD,Iron and TIBC and sample to blood bank on Monday with MD visit and blood transfusion can be scheduled at that time if needed per Dr. Oliva Bustard. VSS Pt given these instrutions with new appts and verbalized understanding. Pt also instructed to go to ER over the weekend for increased SOB or chest pain and understanding verbalized. Pt discharged self ambulatory in satisfactory condition

## 2016-10-30 ENCOUNTER — Ambulatory Visit (HOSPITAL_COMMUNITY): Payer: Medicare HMO

## 2016-10-30 ENCOUNTER — Other Ambulatory Visit (HOSPITAL_COMMUNITY): Payer: Self-pay | Admitting: Emergency Medicine

## 2016-10-30 ENCOUNTER — Encounter (HOSPITAL_COMMUNITY): Payer: Medicare HMO

## 2016-10-30 ENCOUNTER — Telehealth (HOSPITAL_COMMUNITY): Payer: Self-pay | Admitting: Emergency Medicine

## 2016-10-30 DIAGNOSIS — D508 Other iron deficiency anemias: Secondary | ICD-10-CM

## 2016-10-30 DIAGNOSIS — D649 Anemia, unspecified: Secondary | ICD-10-CM

## 2016-10-30 DIAGNOSIS — D464 Refractory anemia, unspecified: Secondary | ICD-10-CM | POA: Diagnosis not present

## 2016-10-30 LAB — CBC WITH DIFFERENTIAL/PLATELET
Basophils Absolute: 0 10*3/uL (ref 0.0–0.1)
Basophils Relative: 0 %
EOS PCT: 0 %
Eosinophils Absolute: 0 10*3/uL (ref 0.0–0.7)
HCT: 21.1 % — ABNORMAL LOW (ref 39.0–52.0)
HEMOGLOBIN: 7 g/dL — AB (ref 13.0–17.0)
LYMPHS ABS: 0.9 10*3/uL (ref 0.7–4.0)
LYMPHS PCT: 47 %
MCH: 31.8 pg (ref 26.0–34.0)
MCHC: 33.2 g/dL (ref 30.0–36.0)
MCV: 95.9 fL (ref 78.0–100.0)
MONOS PCT: 1 %
Monocytes Absolute: 0 10*3/uL — ABNORMAL LOW (ref 0.1–1.0)
NEUTROS PCT: 52 %
Neutro Abs: 1 10*3/uL — ABNORMAL LOW (ref 1.7–7.7)
Platelets: 126 10*3/uL — ABNORMAL LOW (ref 150–400)
RBC: 2.2 MIL/uL — AB (ref 4.22–5.81)
RDW: 23.1 % — ABNORMAL HIGH (ref 11.5–15.5)
WBC: 2 10*3/uL — AB (ref 4.0–10.5)

## 2016-10-30 LAB — IRON AND TIBC
Iron: 215 ug/dL — ABNORMAL HIGH (ref 45–182)
Saturation Ratios: 87 % — ABNORMAL HIGH (ref 17.9–39.5)
TIBC: 246 ug/dL — ABNORMAL LOW (ref 250–450)
UIBC: 31 ug/dL

## 2016-10-30 LAB — PREPARE RBC (CROSSMATCH)

## 2016-10-30 LAB — SAMPLE TO BLOOD BANK

## 2016-10-30 NOTE — Progress Notes (Signed)
Orders placed for 2 units of blood.  Spoke with bobby in lab and let them know we needed a type and screen on this pt as well as prepare for 2 units of blood.

## 2016-10-30 NOTE — Progress Notes (Signed)
Can we set him up for 2 units pRBC transfusion. TY

## 2016-10-30 NOTE — Telephone Encounter (Signed)
Called tina to let her know about pts lab work today at 2:10 for type and screen for blood on Wednesday at 9:00 am in short stay.  She said she would let pt know about his appts.

## 2016-11-01 ENCOUNTER — Encounter (HOSPITAL_COMMUNITY)
Admission: RE | Admit: 2016-11-01 | Discharge: 2016-11-01 | Disposition: A | Discharge status: Home / Self Care | Payer: Medicare HMO | Source: Ambulatory Visit | Attending: Oncology | Admitting: Oncology

## 2016-11-01 DIAGNOSIS — D464 Refractory anemia, unspecified: Secondary | ICD-10-CM | POA: Diagnosis present

## 2016-11-01 DIAGNOSIS — D649 Anemia, unspecified: Secondary | ICD-10-CM

## 2016-11-01 MED ORDER — SODIUM CHLORIDE 0.9 % IV SOLN
250.0000 mL | Freq: Once | INTRAVENOUS | Status: AC
  Administered 2016-11-01: 250 mL via INTRAVENOUS

## 2016-11-01 MED ORDER — HEPARIN SOD (PORK) LOCK FLUSH 100 UNIT/ML IV SOLN: 500.0000 [IU] | Freq: Every day | INTRAVENOUS | Status: DC | PRN

## 2016-11-01 MED ORDER — ACETAMINOPHEN 325 MG PO TABS: 650.0000 mg | ORAL_TABLET | Freq: Once | ORAL | Status: DC

## 2016-11-01 MED ORDER — DIPHENHYDRAMINE HCL 25 MG PO CAPS: 25.0000 mg | ORAL_CAPSULE | Freq: Once | ORAL | Status: DC

## 2016-11-01 NOTE — Progress Notes (Signed)
Refused po tylenol and benadryl.

## 2016-11-02 LAB — TYPE AND SCREEN
ABO/RH(D): A POS
Antibody Screen: NEGATIVE
UNIT DIVISION: 0
Unit division: 0

## 2016-11-02 LAB — BPAM RBC
Blood Product Expiration Date: 201807302359
Blood Product Expiration Date: 201807312359
ISSUE DATE / TIME: 201807181057
ISSUE DATE / TIME: 201807180907
Unit Type and Rh: 6200
Unit Type and Rh: 6200

## 2016-11-03 ENCOUNTER — Encounter (HOSPITAL_BASED_OUTPATIENT_CLINIC_OR_DEPARTMENT_OTHER): Payer: Medicare HMO

## 2016-11-03 ENCOUNTER — Encounter (HOSPITAL_COMMUNITY): Payer: Self-pay

## 2016-11-03 ENCOUNTER — Encounter (HOSPITAL_COMMUNITY): Payer: Medicare HMO

## 2016-11-03 VITALS — BP 140/66 | HR 78 | Resp 16

## 2016-11-03 DIAGNOSIS — D464 Refractory anemia, unspecified: Secondary | ICD-10-CM | POA: Diagnosis not present

## 2016-11-03 DIAGNOSIS — D649 Anemia, unspecified: Secondary | ICD-10-CM

## 2016-11-03 DIAGNOSIS — D4621 Refractory anemia with excess of blasts 1: Secondary | ICD-10-CM

## 2016-11-03 LAB — CBC WITH DIFFERENTIAL/PLATELET
BASOS ABS: 0 10*3/uL (ref 0.0–0.1)
BASOS PCT: 0 %
EOS PCT: 0 %
Eosinophils Absolute: 0 10*3/uL (ref 0.0–0.7)
HCT: 28 % — ABNORMAL LOW (ref 39.0–52.0)
Hemoglobin: 9.3 g/dL — ABNORMAL LOW (ref 13.0–17.0)
LYMPHS PCT: 51 %
Lymphs Abs: 0.8 10*3/uL (ref 0.7–4.0)
MCH: 30.2 pg (ref 26.0–34.0)
MCHC: 33.2 g/dL (ref 30.0–36.0)
MCV: 90.9 fL (ref 78.0–100.0)
MONO ABS: 0 10*3/uL — AB (ref 0.1–1.0)
Monocytes Relative: 1 %
NEUTROS ABS: 0.7 10*3/uL — AB (ref 1.7–7.7)
Neutrophils Relative %: 47 %
PLATELETS: 117 10*3/uL — AB (ref 150–400)
RBC: 3.08 MIL/uL — AB (ref 4.22–5.81)
RDW: 22.3 % — AB (ref 11.5–15.5)
WBC: 1.5 10*3/uL — AB (ref 4.0–10.5)

## 2016-11-03 LAB — SAMPLE TO BLOOD BANK

## 2016-11-03 MED ORDER — DARBEPOETIN ALFA 300 MCG/0.6ML IJ SOSY
300.0000 ug | PREFILLED_SYRINGE | Freq: Once | INTRAMUSCULAR | Status: AC
  Administered 2016-11-03: 300 ug via SUBCUTANEOUS

## 2016-11-03 MED ORDER — DARBEPOETIN ALFA 300 MCG/0.6ML IJ SOSY
PREFILLED_SYRINGE | INTRAMUSCULAR | Status: AC
  Filled 2016-11-03: qty 0.6

## 2016-11-03 NOTE — Progress Notes (Signed)
Omar Lee presents today for injection per MD orders. [name] 376mcg administered SQ in right Abdomen. Administration without incident. Patient tolerated well. Vitals stable and discharged home from clinic ambulatory. Follow up as scheduled.

## 2016-11-09 ENCOUNTER — Other Ambulatory Visit (HOSPITAL_COMMUNITY): Payer: Self-pay | Admitting: *Deleted

## 2016-11-09 DIAGNOSIS — D464 Refractory anemia, unspecified: Secondary | ICD-10-CM

## 2016-11-10 ENCOUNTER — Encounter (HOSPITAL_COMMUNITY): Payer: Self-pay

## 2016-11-10 ENCOUNTER — Encounter (HOSPITAL_BASED_OUTPATIENT_CLINIC_OR_DEPARTMENT_OTHER): Payer: Medicare HMO

## 2016-11-10 ENCOUNTER — Encounter (HOSPITAL_COMMUNITY): Payer: Medicare HMO

## 2016-11-10 VITALS — BP 126/70 | HR 86 | Temp 99.0°F | Resp 18

## 2016-11-10 DIAGNOSIS — D464 Refractory anemia, unspecified: Secondary | ICD-10-CM

## 2016-11-10 DIAGNOSIS — D4621 Refractory anemia with excess of blasts 1: Secondary | ICD-10-CM

## 2016-11-10 DIAGNOSIS — D649 Anemia, unspecified: Secondary | ICD-10-CM

## 2016-11-10 LAB — CBC WITH DIFFERENTIAL/PLATELET
BASOS ABS: 0 10*3/uL (ref 0.0–0.1)
Basophils Relative: 0 %
Eosinophils Absolute: 0 10*3/uL (ref 0.0–0.7)
Eosinophils Relative: 0 %
HEMATOCRIT: 27.3 % — AB (ref 39.0–52.0)
Hemoglobin: 9.1 g/dL — ABNORMAL LOW (ref 13.0–17.0)
LYMPHS ABS: 0.9 10*3/uL (ref 0.7–4.0)
LYMPHS PCT: 49 %
MCH: 31 pg (ref 26.0–34.0)
MCHC: 33.3 g/dL (ref 30.0–36.0)
MCV: 92.9 fL (ref 78.0–100.0)
Monocytes Absolute: 0 10*3/uL — ABNORMAL LOW (ref 0.1–1.0)
Monocytes Relative: 1 %
NEUTROS PCT: 51 %
Neutro Abs: 0.9 10*3/uL — ABNORMAL LOW (ref 1.7–7.7)
PLATELETS: 131 10*3/uL — AB (ref 150–400)
RBC: 2.94 MIL/uL — AB (ref 4.22–5.81)
RDW: 21.7 % — ABNORMAL HIGH (ref 11.5–15.5)
WBC: 1.8 10*3/uL — AB (ref 4.0–10.5)

## 2016-11-10 LAB — SAMPLE TO BLOOD BANK

## 2016-11-10 MED ORDER — DARBEPOETIN ALFA 300 MCG/0.6ML IJ SOSY
PREFILLED_SYRINGE | INTRAMUSCULAR | Status: AC
  Filled 2016-11-10: qty 0.6

## 2016-11-10 MED ORDER — DARBEPOETIN ALFA 300 MCG/0.6ML IJ SOSY
300.0000 ug | PREFILLED_SYRINGE | Freq: Once | INTRAMUSCULAR | Status: AC
  Administered 2016-11-10: 300 ug via SUBCUTANEOUS

## 2016-11-10 NOTE — Patient Instructions (Signed)
[place] at [facility] Discharge Instructions  RECOMMENDATIONS MADE BY THE CONSULTANT AND ANY TEST RESULTS WILL BE SENT TO YOUR REFERRING PHYSICIAN.  You were given an [name] injection today. Continue injections and labs as scheduled.   Thank you for choosing [place] at [facility] to provide your oncology and hematology care.  To afford each patient quality time with our provider, please arrive at least 15 minutes before your scheduled appointment time.    If you have a lab appointment with the [facility] please come in thru the  Main Entrance and check in at the main information desk  You need to re-schedule your appointment should you arrive 10 or more minutes late.  We strive to give you quality time with our providers, and arriving late affects you and other patients whose appointments are after yours.  Also, if you no show three or more times for appointments you may be dismissed from the clinic at the providers discretion.     Again, thank you for choosing [facility].  Our hope is that these requests will decrease the amount of time that you wait before being seen by our physicians.       _____________________________________________________________  Should you have questions after your visit to [facility], please contact our office at [phone] between the hours of 8:30 a.m. and 4:30 p.m.  Voicemails left after 4:30 p.m. will not be returned until the following business day.  For prescription refill requests, have your pharmacy contact our office.       Resources For Cancer Patients and their Caregivers ? American Cancer Society: Can assist with transportation, wigs, general needs, runs Look Good Feel Better.        (319) 448-4136 ? Cancer Care: Provides financial assistance, online support groups, medication/co-pay assistance.  1-800-813-HOPE 657-513-4792) ? [facility] Assists [place] cancer patients and their families through emotional , educational and financial support.  (331)280-9685 ? [place] DSS Where to apply for food stamps, Medicaid and utility assistance. (505) 340-2664 ? RCATS: Transportation to medical appointments. 9050067912 ? Social Security Administration: May apply for disability if have a Stage IV cancer. 505-046-5949 (414)299-0099 ? [employer], Disability and Transit Services: Assists with nutrition, care and transit needs. [facility] Support Programs: @10RELATIVEDAYS @ > Cancer Support Group  2nd Tuesday of the month 1pm-2pm, Journey Room  > Creative Journey  3rd Tuesday of the month 1130am-1pm, Journey Room  > Look Good Feel Better  1st Wednesday of the month 10am-12 noon, Journey Room (Call [facility] to register [phone])

## 2016-11-10 NOTE — Progress Notes (Signed)
Pt here today for [name] injection. Pt given injection in right upper abdomen. Pt tolerated well. PT stable and discharged home ambulatory.

## 2016-11-17 ENCOUNTER — Encounter (HOSPITAL_COMMUNITY): Payer: Self-pay

## 2016-11-17 ENCOUNTER — Encounter (HOSPITAL_BASED_OUTPATIENT_CLINIC_OR_DEPARTMENT_OTHER): Payer: Medicare HMO

## 2016-11-17 ENCOUNTER — Encounter (HOSPITAL_COMMUNITY): Payer: Medicare HMO | Attending: Oncology

## 2016-11-17 VITALS — BP 147/63 | HR 93 | Temp 98.7°F | Resp 20

## 2016-11-17 DIAGNOSIS — D464 Refractory anemia, unspecified: Secondary | ICD-10-CM

## 2016-11-17 DIAGNOSIS — D649 Anemia, unspecified: Secondary | ICD-10-CM

## 2016-11-17 DIAGNOSIS — D4621 Refractory anemia with excess of blasts 1: Secondary | ICD-10-CM | POA: Diagnosis not present

## 2016-11-17 LAB — CBC WITH DIFFERENTIAL/PLATELET
BASOS ABS: 0 10*3/uL (ref 0.0–0.1)
BASOS PCT: 0 %
EOS ABS: 0 10*3/uL (ref 0.0–0.7)
Eosinophils Relative: 0 %
HCT: 25.8 % — ABNORMAL LOW (ref 39.0–52.0)
HEMOGLOBIN: 8.5 g/dL — AB (ref 13.0–17.0)
Lymphocytes Relative: 48 %
Lymphs Abs: 0.8 10*3/uL (ref 0.7–4.0)
MCH: 30.9 pg (ref 26.0–34.0)
MCHC: 32.9 g/dL (ref 30.0–36.0)
MCV: 93.8 fL (ref 78.0–100.0)
MONO ABS: 0 10*3/uL — AB (ref 0.1–1.0)
Monocytes Relative: 0 %
NEUTROS ABS: 0.8 10*3/uL — AB (ref 1.7–7.7)
NEUTROS PCT: 53 %
PLATELETS: 122 10*3/uL — AB (ref 150–400)
RBC: 2.75 MIL/uL — ABNORMAL LOW (ref 4.22–5.81)
RDW: 21.8 % — ABNORMAL HIGH (ref 11.5–15.5)
WBC: 1.6 10*3/uL — ABNORMAL LOW (ref 4.0–10.5)

## 2016-11-17 MED ORDER — DARBEPOETIN ALFA 300 MCG/0.6ML IJ SOSY
PREFILLED_SYRINGE | INTRAMUSCULAR | Status: AC
  Filled 2016-11-17: qty 0.6

## 2016-11-17 MED ORDER — DARBEPOETIN ALFA 300 MCG/0.6ML IJ SOSY
300.0000 ug | PREFILLED_SYRINGE | Freq: Once | INTRAMUSCULAR | Status: AC
  Administered 2016-11-17: 300 ug via SUBCUTANEOUS

## 2016-11-17 NOTE — Patient Instructions (Signed)
[facility] at [facility] Discharge Instructions  RECOMMENDATIONS MADE BY THE CONSULTANT AND ANY TEST RESULTS WILL BE SENT TO YOUR REFERRING PHYSICIAN.  You received your [name] injection today Follow up as scheduled.  Thank you for choosing [facility] at [facility] to provide your oncology and hematology care.  To afford each patient quality time with our provider, please arrive at least 15 minutes before your scheduled appointment time.    If you have a lab appointment with the Cancer Center please come in thru the  Main Entrance and check in at the main information desk  You need to re-schedule your appointment should you arrive 10 or more minutes late.  We strive to give you quality time with our providers, and arriving late affects you and other patients whose appointments are after yours.  Also, if you no show three or more times for appointments you may be dismissed from the clinic at the providers discretion.     Again, thank you for choosing [facility].  Our hope is that these requests will decrease the amount of time that you wait before being seen by our physicians.       _____________________________________________________________  Should you have questions after your visit to [facility], please contact our office at [phone] between the hours of 8:30 a.m. and 4:30 p.m.  Voicemails left after 4:30 p.m. will not be returned until the following business day.  For prescription refill requests, have your pharmacy contact our office.       Resources For Cancer Patients and their Caregivers ? American Cancer Society: Can assist with transportation, wigs, general needs, runs Look Good Feel Better.        1-888-227-6333 ? Cancer Care: Provides financial assistance, online support groups, medication/co-pay assistance.  1-800-813-HOPE (4673) ? [facility] Assists [place]  cancer patients and their families through emotional , educational and financial support.  336-427-4357 ? [place] DSS Where to apply for food stamps, Medicaid and utility assistance. 336-342-1394 ? RCATS: Transportation to medical appointments. 336-347-2287 ? Social Security Administration: May apply for disability if have a Stage IV cancer. 336-342-7796 1-800-772-1213 ? [place] Aging, Disability and Transit Services: Assists with nutrition, care and transit needs. 336-349-2343  Cancer Center Support Programs: @10RELATIVEDAYS@ > Cancer Support Group  2nd Tuesday of the month 1pm-2pm, Journey Room  > Creative Journey  3rd Tuesday of the month 1130am-1pm, Journey Room  > Look Good Feel Better  1st Wednesday of the month 10am-12 noon, Journey Room (Call American Cancer Society to register 1-800-395-5775)    

## 2016-11-17 NOTE — Progress Notes (Signed)
Omar Lee presents today for injection per MD orders. [name] 300 mcg administered SQ in right lower abdomen. Administration without incident. Patient tolerated well. Patient discharged ambulatory and in stable condition to self from clinic. Follow up next week as scheduled.

## 2016-11-20 ENCOUNTER — Ambulatory Visit (HOSPITAL_COMMUNITY): Payer: Medicare HMO

## 2016-11-20 ENCOUNTER — Other Ambulatory Visit (HOSPITAL_COMMUNITY): Payer: Medicare HMO

## 2016-11-24 ENCOUNTER — Encounter (HOSPITAL_COMMUNITY): Payer: Medicare HMO

## 2016-11-24 ENCOUNTER — Encounter (HOSPITAL_BASED_OUTPATIENT_CLINIC_OR_DEPARTMENT_OTHER): Payer: Medicare HMO

## 2016-11-24 ENCOUNTER — Encounter (HOSPITAL_COMMUNITY): Payer: Self-pay

## 2016-11-24 VITALS — BP 143/63 | HR 78 | Temp 98.8°F | Resp 20

## 2016-11-24 DIAGNOSIS — D464 Refractory anemia, unspecified: Secondary | ICD-10-CM

## 2016-11-24 DIAGNOSIS — D4621 Refractory anemia with excess of blasts 1: Secondary | ICD-10-CM

## 2016-11-24 DIAGNOSIS — D649 Anemia, unspecified: Secondary | ICD-10-CM

## 2016-11-24 LAB — SAMPLE TO BLOOD BANK

## 2016-11-24 LAB — CBC WITH DIFFERENTIAL/PLATELET
BASOS ABS: 0 10*3/uL (ref 0.0–0.1)
Basophils Relative: 0 %
Eosinophils Absolute: 0 10*3/uL (ref 0.0–0.7)
Eosinophils Relative: 0 %
HCT: 24.6 % — ABNORMAL LOW (ref 39.0–52.0)
HEMOGLOBIN: 7.9 g/dL — AB (ref 13.0–17.0)
LYMPHS ABS: 1 10*3/uL (ref 0.7–4.0)
Lymphocytes Relative: 54 %
MCH: 30.7 pg (ref 26.0–34.0)
MCHC: 32.1 g/dL (ref 30.0–36.0)
MCV: 95.7 fL (ref 78.0–100.0)
MONOS PCT: 1 %
Monocytes Absolute: 0 10*3/uL — ABNORMAL LOW (ref 0.1–1.0)
Neutro Abs: 0.8 10*3/uL — ABNORMAL LOW (ref 1.7–7.7)
Neutrophils Relative %: 45 %
Platelets: 142 10*3/uL — ABNORMAL LOW (ref 150–400)
RBC: 2.57 MIL/uL — AB (ref 4.22–5.81)
RDW: 22.4 % — AB (ref 11.5–15.5)
WBC: 1.8 10*3/uL — AB (ref 4.0–10.5)

## 2016-11-24 MED ORDER — DARBEPOETIN ALFA 300 MCG/0.6ML IJ SOSY
300.0000 ug | PREFILLED_SYRINGE | Freq: Once | INTRAMUSCULAR | Status: AC
  Administered 2016-11-24: 300 ug via SUBCUTANEOUS
  Filled 2016-11-24: qty 0.6

## 2016-11-24 NOTE — Patient Instructions (Signed)
[place] at [facility] Discharge Instructions  RECOMMENDATIONS MADE BY THE CONSULTANT AND ANY TEST RESULTS WILL BE SENT TO YOUR REFERRING PHYSICI You received [name] today.  Keep scheduled appointments and call for any problems.    Thank you for choosing [place] at [facility] to provide your oncology and hematology care.  To afford each patient quality time with our provider, please arrive at least 15 minutes before your scheduled appointment time.    If you have a lab appointment with the [place] please come in thru the  Main Entrance and check in at the main information desk  You need to re-schedule your appointment should you arrive 10 or more minutes late.  We strive to give you quality time with our providers, and arriving late affects you and other patients whose appointments are after yours.  Also, if you no show three or more times for appointments you may be dismissed from the clinic at the providers discretion.     Again, thank you for choosing [facility].  Our hope is that these requests will decrease the amount of time that you wait before being seen by our physicians.       _____________________________________________________________  Should you have questions after your visit to [facility], please contact our office at [phone] between the hours of 8:30 a.m. and 4:30 p.m.  Voicemails left after 4:30 p.m. will not be returned until the following business day.  For prescription refill requests, have your pharmacy contact our office.       Resources For Cancer Patients and their Caregivers ? American Cancer Society: Can assist with transportation, wigs, general needs, runs Look Good Feel Better.        681-707-7146 ? Cancer Care: Provides financial assistance, online support groups, medication/co-pay assistance.  1-800-813-HOPE (409)189-3825) ? [place] Assists  [place] cancer patients and their families through emotional , educational and financial support.  785-487-1906 ? [place] DSS Where to apply for food stamps, Medicaid and utility assistance. 318-243-6594 ? RCATS: Transportation to medical appointments. 450-409-0741 ? Social Security Administration: May apply for disability if have a Stage IV cancer. 587-041-5429 431-002-7231 ? [employer], Disability and Transit Services: Assists with nutrition, care and transit needs. [place] Support Programs: @10RELATIVEDAYS @ > Cancer Support Group  2nd Tuesday of the month 1pm-2pm, Journey Room  > Creative Journey  3rd Tuesday of the month 1130am-1pm, Journey Room  > Look Good Feel Better  1st Wednesday of the month 10am-12 noon, Journey Room (Call [place] to register [phone])

## 2016-11-24 NOTE — Progress Notes (Signed)
Omar Lee labs and status reviewed with [name], NP, today.  Patient stated fatigue with no worsening from last week.  No complaints of SOB unless he gets upset or angry.  Refused blood transfusion when offered.  Patient stated he would wait until next week and notify us if he felt any worse and needed a transfusion.  Instructed the patient to call for any changes in symptoms or worsening.  Verbalized understanding.  Received [name] and tolerated well.  Site clean and dry with no bruising or swelling noted at site.  VSS and discharged in stable condition ambulatory.

## 2016-12-01 ENCOUNTER — Encounter (HOSPITAL_BASED_OUTPATIENT_CLINIC_OR_DEPARTMENT_OTHER): Payer: Medicare HMO

## 2016-12-01 ENCOUNTER — Encounter (HOSPITAL_COMMUNITY): Payer: Medicare HMO

## 2016-12-01 ENCOUNTER — Encounter (HOSPITAL_COMMUNITY): Payer: Self-pay

## 2016-12-01 VITALS — BP 140/67 | HR 62 | Temp 98.2°F | Resp 18

## 2016-12-01 DIAGNOSIS — D4621 Refractory anemia with excess of blasts 1: Secondary | ICD-10-CM

## 2016-12-01 DIAGNOSIS — D649 Anemia, unspecified: Secondary | ICD-10-CM

## 2016-12-01 DIAGNOSIS — D464 Refractory anemia, unspecified: Secondary | ICD-10-CM

## 2016-12-01 LAB — SAMPLE TO BLOOD BANK

## 2016-12-01 LAB — CBC WITH DIFFERENTIAL/PLATELET
BASOS ABS: 0 10*3/uL (ref 0.0–0.1)
BASOS PCT: 0 %
EOS ABS: 0 10*3/uL (ref 0.0–0.7)
Eosinophils Relative: 0 %
HCT: 22.6 % — ABNORMAL LOW (ref 39.0–52.0)
HEMOGLOBIN: 7.3 g/dL — AB (ref 13.0–17.0)
Lymphocytes Relative: 55 %
Lymphs Abs: 0.9 10*3/uL (ref 0.7–4.0)
MCH: 31.1 pg (ref 26.0–34.0)
MCHC: 32.3 g/dL (ref 30.0–36.0)
MCV: 96.2 fL (ref 78.0–100.0)
Monocytes Absolute: 0 10*3/uL — ABNORMAL LOW (ref 0.1–1.0)
Monocytes Relative: 1 %
NEUTROS ABS: 0.7 10*3/uL — AB (ref 1.7–7.7)
NEUTROS PCT: 44 %
PLATELETS: 130 10*3/uL — AB (ref 150–400)
RBC: 2.35 MIL/uL — ABNORMAL LOW (ref 4.22–5.81)
RDW: 23.4 % — ABNORMAL HIGH (ref 11.5–15.5)
WBC: 1.6 10*3/uL — AB (ref 4.0–10.5)

## 2016-12-01 LAB — PREPARE RBC (CROSSMATCH)

## 2016-12-01 MED ORDER — ACETAMINOPHEN 325 MG PO TABS
650.0000 mg | ORAL_TABLET | Freq: Once | ORAL | Status: AC
  Administered 2016-12-01: 650 mg via ORAL
  Filled 2016-12-01: qty 2

## 2016-12-01 MED ORDER — DARBEPOETIN ALFA 300 MCG/0.6ML IJ SOSY
300.0000 ug | PREFILLED_SYRINGE | Freq: Once | INTRAMUSCULAR | Status: AC
  Administered 2016-12-01: 300 ug via SUBCUTANEOUS
  Filled 2016-12-01: qty 0.6

## 2016-12-01 MED ORDER — DIPHENHYDRAMINE HCL 25 MG PO CAPS
25.0000 mg | ORAL_CAPSULE | Freq: Once | ORAL | Status: AC
  Administered 2016-12-01: 25 mg via ORAL
  Filled 2016-12-01: qty 1

## 2016-12-01 MED ORDER — SODIUM CHLORIDE 0.9% FLUSH
3.0000 mL | INTRAVENOUS | Status: AC | PRN
  Administered 2016-12-01: 3 mL

## 2016-12-01 MED ORDER — HEPARIN SOD (PORK) LOCK FLUSH 100 UNIT/ML IV SOLN: 250.0000 [IU] | INTRAVENOUS | Status: DC | PRN

## 2016-12-01 MED ORDER — SODIUM CHLORIDE 0.9 % IV SOLN
250.0000 mL | Freq: Once | INTRAVENOUS | Status: AC
  Administered 2016-12-01: 250 mL via INTRAVENOUS

## 2016-12-01 NOTE — Patient Instructions (Signed)
[place] at [facility] Discharge Instructions  RECOMMENDATIONS MADE BY THE CONSULTANT AND ANY TEST RESULTS WILL BE SENT TO YOUR REFERRING PHYSICIAN.  Received blood transfusion( 2 units ) as well as [name] injection today. Follow-up as scheduled. Call clinic for any questions or concerns  Thank you for choosing [place] at [facility] to provide your oncology and hematology care.  To afford each patient quality time with our provider, please arrive at least 15 minutes before your scheduled appointment time.    If you have a lab appointment with the [place] please come in thru the  Main Entrance and check in at the main information desk  You need to re-schedule your appointment should you arrive 10 or more minutes late.  We strive to give you quality time with our providers, and arriving late affects you and other patients whose appointments are after yours.  Also, if you no show three or more times for appointments you may be dismissed from the clinic at the providers discretion.     Again, thank you for choosing [facility].  Our hope is that these requests will decrease the amount of time that you wait before being seen by our physicians.       _____________________________________________________________  Should you have questions after your visit to [facility], please contact our office at [phone] between the hours of 8:30 a.m. and 4:30 p.m.  Voicemails left after 4:30 p.m. will not be returned until the following business day.  For prescription refill requests, have your pharmacy contact our office.       Resources For Cancer Patients and their Caregivers ? American Cancer Society: Can assist with transportation, wigs, general needs, runs Look Good Feel Better.        (239) [phone] ? Cancer Care: Provides financial assistance, online support groups, medication/co-pay assistance.   1-800-813-HOPE (430) 324-0028) ? [place] Assists [place] cancer patients and their families through emotional , educational and financial support.  780-193-5264 ? [place] DSS Where to apply for food stamps, Medicaid and utility assistance. 787-329-1892 ? RCATS: Transportation to medical appointments. 925 477 9481 ? Social Security Administration: May apply for disability if have a Stage IV cancer. 815 304 6642 [phone] ? [employer], Disability and Transit Services: Assists with nutrition, care and transit needs. [place] Support Programs: @10RELATIVEDAYS @ > Cancer Support Group  2nd Tuesday of the month 1pm-2pm, Journey Room  > Creative Journey  3rd Tuesday of the month 1130am-1pm, Journey Room  > Look Good Feel Better  1st Wednesday of the month 10am-12 noon, Journey Room (Call [place] to register [phone])

## 2016-12-01 NOTE — Progress Notes (Signed)
Omar Lee tolerated blood transfusion and [name] injection well without complaints or incident.Hgb 7.3 which was reviewed with [name] NP and orders obtained to transfuse 2 units of PRBC'S. VSS throughout transfusion process and upon discharge. Pt discharged self ambulatory in satisfactory condition

## 2016-12-02 LAB — BPAM RBC
BLOOD PRODUCT EXPIRATION DATE: 201808252359
Blood Product Expiration Date: 201808252359
ISSUE DATE / TIME: 201808171115
ISSUE DATE / TIME: 201808171317
UNIT TYPE AND RH: 600
Unit Type and Rh: 600

## 2016-12-02 LAB — TYPE AND SCREEN
ABO/RH(D): A POS
ANTIBODY SCREEN: NEGATIVE
UNIT DIVISION: 0
Unit division: 0

## 2016-12-08 ENCOUNTER — Encounter (HOSPITAL_COMMUNITY): Payer: Self-pay

## 2016-12-08 ENCOUNTER — Encounter (HOSPITAL_COMMUNITY): Payer: Medicare HMO

## 2016-12-08 ENCOUNTER — Encounter (HOSPITAL_BASED_OUTPATIENT_CLINIC_OR_DEPARTMENT_OTHER): Payer: Medicare HMO

## 2016-12-08 VITALS — BP 142/55 | HR 80 | Temp 98.6°F | Resp 18

## 2016-12-08 DIAGNOSIS — D464 Refractory anemia, unspecified: Secondary | ICD-10-CM | POA: Diagnosis not present

## 2016-12-08 DIAGNOSIS — D649 Anemia, unspecified: Secondary | ICD-10-CM

## 2016-12-08 DIAGNOSIS — D4621 Refractory anemia with excess of blasts 1: Secondary | ICD-10-CM

## 2016-12-08 LAB — CBC WITH DIFFERENTIAL/PLATELET
BASOS ABS: 0 10*3/uL (ref 0.0–0.1)
BASOS PCT: 0 %
EOS ABS: 0 10*3/uL (ref 0.0–0.7)
EOS PCT: 0 %
HEMATOCRIT: 28.8 % — AB (ref 39.0–52.0)
Hemoglobin: 9.5 g/dL — ABNORMAL LOW (ref 13.0–17.0)
Lymphocytes Relative: 51 %
Lymphs Abs: 1 10*3/uL (ref 0.7–4.0)
MCH: 31.1 pg (ref 26.0–34.0)
MCHC: 33 g/dL (ref 30.0–36.0)
MCV: 94.4 fL (ref 78.0–100.0)
MONO ABS: 0 10*3/uL — AB (ref 0.1–1.0)
Monocytes Relative: 1 %
Neutro Abs: 0.9 10*3/uL — ABNORMAL LOW (ref 1.7–7.7)
Neutrophils Relative %: 49 %
PLATELETS: 124 10*3/uL — AB (ref 150–400)
RBC: 3.05 MIL/uL — AB (ref 4.22–5.81)
RDW: 21.3 % — AB (ref 11.5–15.5)
WBC: 1.9 10*3/uL — AB (ref 4.0–10.5)

## 2016-12-08 LAB — TYPE AND SCREEN
ABO/RH(D): A POS
ANTIBODY SCREEN: NEGATIVE

## 2016-12-08 MED ORDER — DARBEPOETIN ALFA 300 MCG/0.6ML IJ SOSY
PREFILLED_SYRINGE | INTRAMUSCULAR | Status: AC
Start: 2016-12-08 — End: ?
  Filled 2016-12-08: qty 0.6

## 2016-12-08 MED ORDER — DARBEPOETIN ALFA 300 MCG/0.6ML IJ SOSY
300.0000 ug | PREFILLED_SYRINGE | Freq: Once | INTRAMUSCULAR | Status: AC
  Administered 2016-12-08: 300 ug via SUBCUTANEOUS

## 2016-12-08 NOTE — Patient Instructions (Signed)
[place] at [facility] Discharge Instructions  RECOMMENDATIONS MADE BY THE CONSULTANT AND ANY TEST RESULTS WILL BE SENT TO YOUR REFERRING PHYSICIAN.  You received [name] today.  Keep scheduled appointments and call for any questions or concerns.    Thank you for choosing [place] at [facility] to provide your oncology and hematology care.  To afford each patient quality time with our provider, please arrive at least 15 minutes before your scheduled appointment time.    If you have a lab appointment with the [place] please come in thru the  Main Entrance and check in at the main information desk  You need to re-schedule your appointment should you arrive 10 or more minutes late.  We strive to give you quality time with our providers, and arriving late affects you and other patients whose appointments are after yours.  Also, if you no show three or more times for appointments you may be dismissed from the clinic at the providers discretion.     Again, thank you for choosing [facility].  Our hope is that these requests will decrease the amount of time that you wait before being seen by our physicians.       _____________________________________________________________  Should you have questions after your visit to [facility], please contact our office at [phone] between the hours of 8:30 a.m. and 4:30 p.m.  Voicemails left after 4:30 p.m. will not be returned until the following business day.  For prescription refill requests, have your pharmacy contact our office.       Resources For Cancer Patients and their Caregivers ? American Cancer Society: Can assist with transportation, wigs, general needs, runs Look Good Feel Better.        [phone] ? Cancer Care: Provides financial assistance, online support groups, medication/co-pay assistance.  1-800-813-HOPE 602-512-6266) ? [place] Assists [place] cancer patients and their families through emotional , educational and financial support.  5671164635 ? [place] DSS Where to apply for food stamps, Medicaid and utility assistance. 770-094-2651 ? RCATS: Transportation to medical appointments. [phone] ? Social Security Administration: May apply for disability if have a Stage IV cancer. 705-300-9292 214-459-6112 ? [employer], Disability and Transit Services: Assists with nutrition, care and transit needs. [place] Support Programs: @10RELATIVEDAYS @ > Cancer Support Group  2nd Tuesday of the month 1pm-2pm, Journey Room  > Creative Journey  3rd Tuesday of the month 1130am-1pm, Journey Room  > Look Good Feel Better  1st Wednesday of the month 10am-12 noon, Journey Room (Call [place] to register [phone])

## 2016-12-08 NOTE — Progress Notes (Signed)
Patient to treatment area for [name] shot.  Stated he felt much better after blood transfusion from last Friday.  Stated only has SOB with carrying heavy items like groceries.  Fair appetite.  No other complaints voiced.    Patient received [name] and tolerated well.  No complaints voiced.  Site clean and dry with no bruising noted.  Band aid applied.  VSS with discharge and left ambulatory with no complaints voiced.

## 2016-12-14 ENCOUNTER — Other Ambulatory Visit (HOSPITAL_COMMUNITY): Payer: Self-pay | Admitting: *Deleted

## 2016-12-14 DIAGNOSIS — D508 Other iron deficiency anemias: Secondary | ICD-10-CM

## 2016-12-15 ENCOUNTER — Encounter (HOSPITAL_COMMUNITY): Payer: Self-pay

## 2016-12-15 ENCOUNTER — Encounter (HOSPITAL_COMMUNITY): Payer: Medicare HMO

## 2016-12-15 ENCOUNTER — Encounter (HOSPITAL_BASED_OUTPATIENT_CLINIC_OR_DEPARTMENT_OTHER): Payer: Medicare HMO

## 2016-12-15 VITALS — BP 136/58 | HR 80 | Temp 98.9°F | Resp 18

## 2016-12-15 DIAGNOSIS — D508 Other iron deficiency anemias: Secondary | ICD-10-CM

## 2016-12-15 DIAGNOSIS — D4621 Refractory anemia with excess of blasts 1: Secondary | ICD-10-CM | POA: Diagnosis not present

## 2016-12-15 DIAGNOSIS — D464 Refractory anemia, unspecified: Secondary | ICD-10-CM

## 2016-12-15 DIAGNOSIS — D649 Anemia, unspecified: Secondary | ICD-10-CM

## 2016-12-15 LAB — CBC WITH DIFFERENTIAL/PLATELET
Basophils Absolute: 0 10*3/uL (ref 0.0–0.1)
Basophils Relative: 0 %
EOS PCT: 0 %
Eosinophils Absolute: 0 10*3/uL (ref 0.0–0.7)
HEMATOCRIT: 26.4 % — AB (ref 39.0–52.0)
HEMOGLOBIN: 8.5 g/dL — AB (ref 13.0–17.0)
LYMPHS PCT: 56 %
Lymphs Abs: 1 10*3/uL (ref 0.7–4.0)
MCH: 30.6 pg (ref 26.0–34.0)
MCHC: 32.2 g/dL (ref 30.0–36.0)
MCV: 95 fL (ref 78.0–100.0)
MONOS PCT: 0 %
Monocytes Absolute: 0 10*3/uL — ABNORMAL LOW (ref 0.1–1.0)
NEUTROS PCT: 44 %
Neutro Abs: 0.8 10*3/uL — ABNORMAL LOW (ref 1.7–7.7)
Platelets: 149 10*3/uL — ABNORMAL LOW (ref 150–400)
RBC: 2.78 MIL/uL — AB (ref 4.22–5.81)
RDW: 21.6 % — ABNORMAL HIGH (ref 11.5–15.5)
WBC: 1.8 10*3/uL — AB (ref 4.0–10.5)

## 2016-12-15 LAB — SAMPLE TO BLOOD BANK

## 2016-12-15 MED ORDER — DARBEPOETIN ALFA 300 MCG/0.6ML IJ SOSY
300.0000 ug | PREFILLED_SYRINGE | Freq: Once | INTRAMUSCULAR | Status: AC
  Administered 2016-12-15: 300 ug via SUBCUTANEOUS

## 2016-12-15 MED ORDER — DARBEPOETIN ALFA 300 MCG/0.6ML IJ SOSY
PREFILLED_SYRINGE | INTRAMUSCULAR | Status: AC
  Filled 2016-12-15: qty 0.6

## 2016-12-15 NOTE — Patient Instructions (Signed)
[place] at [facility] Discharge Instructions  RECOMMENDATIONS MADE BY THE CONSULTANT AND ANY TEST RESULTS WILL BE SENT TO YOUR REFERRING PHYSICIAN.  You received your [name] injection today Follow up next Friday for next injection and appointment with provider.  Thank you for choosing [place] at [facility] to provide your oncology and hematology care.  To afford each patient quality time with our provider, please arrive at least 15 minutes before your scheduled appointment time.    If you have a lab appointment with the [place] please come in thru the  Main Entrance and check in at the main information desk  You need to re-schedule your appointment should you arrive 10 or more minutes late.  We strive to give you quality time with our providers, and arriving late affects you and other patients whose appointments are after yours.  Also, if you no show three or more times for appointments you may be dismissed from the clinic at the providers discretion.     Again, thank you for choosing [facility].  Our hope is that these requests will decrease the amount of time that you wait before being seen by our physicians.       _____________________________________________________________  Should you have questions after your visit to [facility], please contact our office at [phone] between the hours of 8:30 a.m. and 4:30 p.m.  Voicemails left after 4:30 p.m. will not be returned until the following business day.  For prescription refill requests, have your pharmacy contact our office.       Resources For Cancer Patients and their Caregivers ? American Cancer Society: Can assist with transportation, wigs, general needs, runs Look Good Feel Better.        614-772-4001 ? Cancer Care: Provides financial assistance, online support groups, medication/co-pay assistance.  1-800-813-HOPE 581-798-2457) ? [place] Assists [place] cancer patients and their families through emotional , educational and financial support.  309-653-8874 ? [place] DSS Where to apply for food stamps, Medicaid and utility assistance. 218-458-5443 ? RCATS: Transportation to medical appointments. (579) 354-4458 ? Social Security Administration: May apply for disability if have a Stage IV cancer. 6625756246 469-226-8862 ? [employer], Disability and Transit Services: Assists with nutrition, care and transit needs. [place] Support Programs: @10RELATIVEDAYS @ > Cancer Support Group  2nd Tuesday of the month 1pm-2pm, Journey Room  > Creative Journey  3rd Tuesday of the month 1130am-1pm, Journey Room  > Look Good Feel Better  1st Wednesday of the month 10am-12 noon, Journey Room (Call [place] to register [phone])

## 2016-12-15 NOTE — Progress Notes (Signed)
Patient arrives at clinic today with caregiver. Their main concern with Mr. Barrales health is his increasing weakness over the past several weeks. He gets tired easier, his legs are weak, he can't carry anything more than 10 lbs and can't walk long distances. Patient is exhausted after doing daily chores that didn't bother him in recent months. He is concerned about this decrease in stamina.  He states that he feels at times like he is heavy or wrapped in a big blanket.  Patient also states that he has not been eating well because food doesn't taste good to him. He will cook a meal and then not want to eat.  I encouraged patient to try protein shakes of his choice but he was concerned with cost.  I advised him that getting in some extra protein would help him some with his energy.    Darcel Smalling presents today for injection per MD orders. [name] 300 mcg administered SQ in right lower abdomen. Administration without incident. Patient tolerated well.  Patient tolerated treatment without incidence. Patient discharged ambulatory and in stable condition from clinic. Patient to follow up as scheduled.

## 2016-12-21 ENCOUNTER — Other Ambulatory Visit (HOSPITAL_COMMUNITY): Payer: Self-pay | Admitting: *Deleted

## 2016-12-21 DIAGNOSIS — D464 Refractory anemia, unspecified: Secondary | ICD-10-CM

## 2016-12-22 ENCOUNTER — Encounter (HOSPITAL_BASED_OUTPATIENT_CLINIC_OR_DEPARTMENT_OTHER): Payer: Medicare HMO

## 2016-12-22 ENCOUNTER — Encounter (HOSPITAL_BASED_OUTPATIENT_CLINIC_OR_DEPARTMENT_OTHER): Payer: Medicare HMO | Admitting: Adult Health

## 2016-12-22 ENCOUNTER — Encounter (HOSPITAL_COMMUNITY): Payer: Medicare HMO

## 2016-12-22 ENCOUNTER — Encounter (HOSPITAL_COMMUNITY): Payer: Self-pay | Admitting: Adult Health

## 2016-12-22 ENCOUNTER — Encounter (HOSPITAL_COMMUNITY): Payer: Medicare HMO | Attending: Oncology

## 2016-12-22 VITALS — BP 145/50 | HR 78 | Resp 16 | Ht 67.0 in | Wt 185.3 lb

## 2016-12-22 VITALS — BP 125/54 | HR 66 | Temp 97.6°F | Resp 18

## 2016-12-22 DIAGNOSIS — D709 Neutropenia, unspecified: Secondary | ICD-10-CM

## 2016-12-22 DIAGNOSIS — D649 Anemia, unspecified: Secondary | ICD-10-CM

## 2016-12-22 DIAGNOSIS — D4621 Refractory anemia with excess of blasts 1: Secondary | ICD-10-CM

## 2016-12-22 DIAGNOSIS — R634 Abnormal weight loss: Secondary | ICD-10-CM | POA: Diagnosis not present

## 2016-12-22 DIAGNOSIS — R0609 Other forms of dyspnea: Secondary | ICD-10-CM | POA: Diagnosis not present

## 2016-12-22 DIAGNOSIS — D61818 Other pancytopenia: Secondary | ICD-10-CM

## 2016-12-22 DIAGNOSIS — D464 Refractory anemia, unspecified: Secondary | ICD-10-CM

## 2016-12-22 DIAGNOSIS — R110 Nausea: Secondary | ICD-10-CM

## 2016-12-22 DIAGNOSIS — R42 Dizziness and giddiness: Secondary | ICD-10-CM

## 2016-12-22 DIAGNOSIS — R531 Weakness: Secondary | ICD-10-CM

## 2016-12-22 DIAGNOSIS — D508 Other iron deficiency anemias: Secondary | ICD-10-CM

## 2016-12-22 LAB — CBC WITH DIFFERENTIAL/PLATELET
BASOS ABS: 0 10*3/uL (ref 0.0–0.1)
Basophils Relative: 0 %
EOS ABS: 0 10*3/uL (ref 0.0–0.7)
EOS PCT: 0 %
HCT: 25.7 % — ABNORMAL LOW (ref 39.0–52.0)
Hemoglobin: 8.4 g/dL — ABNORMAL LOW (ref 13.0–17.0)
LYMPHS ABS: 0.9 10*3/uL (ref 0.7–4.0)
Lymphocytes Relative: 45 %
MCH: 31.5 pg (ref 26.0–34.0)
MCHC: 32.7 g/dL (ref 30.0–36.0)
MCV: 96.3 fL (ref 78.0–100.0)
Monocytes Absolute: 0.1 10*3/uL (ref 0.1–1.0)
Monocytes Relative: 3 %
NEUTROS PCT: 52 %
Neutro Abs: 1 10*3/uL — ABNORMAL LOW (ref 1.7–7.7)
PLATELETS: 147 10*3/uL — AB (ref 150–400)
RBC: 2.67 MIL/uL — AB (ref 4.22–5.81)
RDW: 22.1 % — AB (ref 11.5–15.5)
WBC: 1.9 10*3/uL — AB (ref 4.0–10.5)

## 2016-12-22 LAB — SAMPLE TO BLOOD BANK

## 2016-12-22 LAB — PREPARE RBC (CROSSMATCH)

## 2016-12-22 MED ORDER — SODIUM CHLORIDE 0.9 % IV SOLN
250.0000 mL | Freq: Once | INTRAVENOUS | Status: AC
  Administered 2016-12-22: 250 mL via INTRAVENOUS

## 2016-12-22 MED ORDER — ACETAMINOPHEN 325 MG PO TABS
650.0000 mg | ORAL_TABLET | Freq: Once | ORAL | Status: AC
  Administered 2016-12-22: 650 mg via ORAL

## 2016-12-22 MED ORDER — ACETAMINOPHEN 325 MG PO TABS
ORAL_TABLET | ORAL | Status: AC
  Filled 2016-12-22: qty 2

## 2016-12-22 MED ORDER — DIPHENHYDRAMINE HCL 25 MG PO CAPS
ORAL_CAPSULE | ORAL | Status: AC
  Filled 2016-12-22: qty 1

## 2016-12-22 MED ORDER — DIPHENHYDRAMINE HCL 25 MG PO CAPS
25.0000 mg | ORAL_CAPSULE | Freq: Once | ORAL | Status: AC
  Administered 2016-12-22: 25 mg via ORAL

## 2016-12-22 MED ORDER — SODIUM CHLORIDE 0.9% FLUSH
10.0000 mL | INTRAVENOUS | Status: DC | PRN
  Administered 2016-12-22: 10 mL via INTRAVENOUS
  Filled 2016-12-22: qty 10

## 2016-12-22 MED ORDER — DARBEPOETIN ALFA 300 MCG/0.6ML IJ SOSY: 300.0000 ug | PREFILLED_SYRINGE | Freq: Once | INTRAMUSCULAR | Status: DC

## 2016-12-22 NOTE — Patient Instructions (Signed)
[place] at [facility] Discharge Instructions  RECOMMENDATIONS MADE BY THE CONSULTANT AND ANY TEST RESULTS WILL BE SENT TO YOUR REFERRING PHYSICIAN.  You received your aranesp injection today You also received one unit of blood. We are moving your appointment dates to Thursdays to be able to treat you more appropriately should you need blood again in the future.   Follow up as scheduled.  Thank you for choosing [place] at [facility] to provide your oncology and hematology care.  To afford each patient quality time with our provider, please arrive at least 15 minutes before your scheduled appointment time.    If you have a lab appointment with the [place] please come in thru the  Main Entrance and check in at the main information desk  You need to re-schedule your appointment should you arrive 10 or more minutes late.  We strive to give you quality time with our providers, and arriving late affects you and other patients whose appointments are after yours.  Also, if you no show three or more times for appointments you may be dismissed from the clinic at the providers discretion.     Again, thank you for choosing [facility].  Our hope is that these requests will decrease the amount of time that you wait before being seen by our physicians.       _____________________________________________________________  Should you have questions after your visit to [facility], please contact our office at [phone] between the hours of 8:30 a.m. and 4:30 p.m.  Voicemails left after 4:30 p.m. will not be returned until the following business day.  For prescription refill requests, have your pharmacy contact our office.       Resources For Cancer Patients and their Caregivers ? American Cancer Society: Can assist with transportation, wigs, general needs, runs Look Good Feel Better.        854-258-4575 ? Cancer  Care: Provides financial assistance, online support groups, medication/co-pay assistance.  1-800-813-HOPE (204) 130-9746) ? [place] Assists [place] cancer patients and their families through emotional , educational and financial support.  562-760-5231 ? [place] DSS Where to apply for food stamps, Medicaid and utility assistance. 830-358-2144 ? RCATS: Transportation to medical appointments. 850-270-9534 ? Social Security Administration: May apply for disability if have a Stage IV cancer. [phone] ? [employer], Disability and Transit Services: Assists with nutrition, care and transit needs. [place] Support Programs: @10RELATIVEDAYS @ > Cancer Support Group  2nd Tuesday of the month 1pm-2pm, Journey Room  > Creative Journey  3rd Tuesday of the month 1130am-1pm, Journey Room  > Look Good Feel Better  1st Wednesday of the month 10am-12 noon, Journey Room (Call [place] to register [phone])

## 2016-12-22 NOTE — Progress Notes (Signed)
See other encounter from today.  

## 2016-12-22 NOTE — Patient Instructions (Addendum)
[place] at [facility] Discharge Instructions  RECOMMENDATIONS MADE BY THE CONSULTANT AND ANY TEST RESULTS WILL BE SENT TO YOUR REFERRING PHYSICIAN.  You were seen today by [name] NP. You had 1 unit of blood today. Continue weekly labs and [name] injection on Fridays. Return in 2 months for follow up.    Thank you for choosing [place] at [facility] to provide your oncology and hematology care.  To afford each patient quality time with our provider, please arrive at least 15 minutes before your scheduled appointment time.    If you have a lab appointment with the [place] please come in thru the  Main Entrance and check in at the main information desk  You need to re-schedule your appointment should you arrive 10 or more minutes late.  We strive to give you quality time with our providers, and arriving late affects you and other patients whose appointments are after yours.  Also, if you no show three or more times for appointments you may be dismissed from the clinic at the providers discretion.     Again, thank you for choosing [facility].  Our hope is that these requests will decrease the amount of time that you wait before being seen by our physicians.       _____________________________________________________________  Should you have questions after your visit to [facility], please contact our office at [phone] between the hours of 8:30 a.m. and 4:30 p.m.  Voicemails left after 4:30 p.m. will not be returned until the following business day.  For prescription refill requests, have your pharmacy contact our office.       Resources For Cancer Patients and their Caregivers ? American Cancer Society: Can assist with transportation, wigs, general needs, runs Look Good Feel Better.        502-550-1316 ? Cancer Care: Provides financial assistance, online support groups,  medication/co-pay assistance.  1-800-813-HOPE (318) 307-4402) ? [place] Assists [place] cancer patients and their families through emotional , educational and financial support.  505 394 2078 ? [place] DSS Where to apply for food stamps, Medicaid and utility assistance. (619)440-0887 ? [employer]: Transportation to medical appointments. (613)887-3617 ? Social Security Administration: May apply for disability if have a Stage IV cancer. 838 361 6491 548-516-2271 ? [employer], Disability and Transit Services: Assists with nutrition, care and transit needs. [place] Support Programs: @10RELATIVEDAYS @ > Cancer Support Group  2nd Tuesday of the month 1pm-2pm, Journey Room  > Creative Journey  3rd Tuesday of the month 1130am-1pm, Journey Room  > Look Good Feel Better  1st Wednesday of the month 10am-12 noon, Journey Room (Call [place] to register [phone])

## 2016-12-22 NOTE — Patient Instructions (Addendum)
[place] at [facility] Discharge Instructions  RECOMMENDATIONS MADE BY THE CONSULTANT AND ANY TEST RESULTS WILL BE SENT TO YOUR REFERRING PHYSICIAN.  We held your [name] injection today You received one unit of blood. Follow up next week with labs and injection.  Thank you for choosing [place] at [facility] to provide your oncology and hematology care.  To afford each patient quality time with our provider, please arrive at least 15 minutes before your scheduled appointment time.    If you have a lab appointment with the [place] please come in thru the  Main Entrance and check in at the main information desk  You need to re-schedule your appointment should you arrive 10 or more minutes late.  We strive to give you quality time with our providers, and arriving late affects you and other patients whose appointments are after yours.  Also, if you no show three or more times for appointments you may be dismissed from the clinic at the providers discretion.     Again, thank you for choosing [facility].  Our hope is that these requests will decrease the amount of time that you wait before being seen by our physicians.       _____________________________________________________________  Should you have questions after your visit to [facility], please contact our office at [phone] between the hours of 8:30 a.m. and 4:30 p.m.  Voicemails left after 4:30 p.m. will not be returned until the following business day.  For prescription refill requests, have your pharmacy contact our office.       Resources For Cancer Patients and their Caregivers ? American Cancer Society: Can assist with transportation, wigs, general needs, runs Look Good Feel Better.        [phone] ? Cancer Care: Provides financial assistance, online support groups, medication/co-pay assistance.  1-800-813-HOPE (925) 634-6455) ? [place] Assists [place] cancer patients and their families through emotional , educational and financial support.  [phone] ? [place] DSS Where to apply for food stamps, Medicaid and utility assistance. 820-653-5574 ? [employer]: Transportation to medical appointments. 734-153-7302 ? Social Security Administration: May apply for disability if have a Stage IV cancer. 828-530-1935 [phone] ? [employer], Disability and Transit Services: Assists with nutrition, care and transit needs. [place] Support Programs: @10RELATIVEDAYS @ > Cancer Support Group  2nd Tuesday of the month 1pm-2pm, Journey Room  > Creative Journey  3rd Tuesday of the month 1130am-1pm, Journey Room  > Look Good Feel Better  1st Wednesday of the month 10am-12 noon, Journey Room (Call [place] to register [phone])

## 2016-12-22 NOTE — Progress Notes (Signed)
Omar Lee, [place] [id number]   CLINIC:  Medical Oncology/Hematology  PCP:  [name], [place] [address] [phone]   REASON FOR VISIT:  Follow-up for MDS/Refractory and symptomatic anemia  CURRENT THERAPY: [name] 300 mcg weekly     HISTORY OF PRESENT ILLNESS:  (From Dr. Laverle Patter last note on 10/20/16)      INTERVAL HISTORY:  Mr. Omar Lee 76 y.o. 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   Here today with Laurine Blazer, his neighbor/friend who helps ensure his medical care is managed.    Overall, he tells me that he feels very tired and weak. "I have wobbly legs."  He is frustrated that he continues to progressively feel weaker with less energy.  He states that he used to feel well for quite awhile after he received blood transfusions, "but now I don't feel much better at all."  Appetite is 50%. He's concerned about having lost quite a bit of weight; he is scheduled to see dietitian today as well.  He has occasional dizziness and nausea.  Denies any frank bleeding episodes including blood in his stools, dark/tarry stools, hematuria, nosebleeds, or gingival bleeding.    He expresses frustration that he cannot do the things he used to enjoy nor can he do as much independently.  Otila Kluver checks on him often, but he continues to live alone.  Denies any falls, he just feels very weak.  Endorses some shortness of breath with exertion, requiring him to stop and rest more often.    He asks, "Are these treatments even working?"    REVIEW OF SYSTEMS:  Review of Systems  Constitutional: Positive for appetite change, fatigue and unexpected weight change. Negative for chills and fever.  HENT:  Negative.  Negative for nosebleeds.   Eyes: Negative.   Respiratory: Positive for shortness of breath.   Cardiovascular: Negative.   Gastrointestinal: Positive for nausea. Negative for blood  in stool, constipation, diarrhea and vomiting.  Endocrine: Negative.   Genitourinary: Negative.  Negative for dysuria and hematuria.   Musculoskeletal: Negative.   Skin: Negative.  Negative for rash.  Neurological: Positive for dizziness.     PAST MEDICAL/SURGICAL HISTORY:  Past Medical History:  Diagnosis Date  . Cancer ([place])    MDS  . Hyperlipidemia   . Hypothyroidism   . Reflux   . Sleep apnea    Past Surgical History:  Procedure Laterality Date  . ESOPHAGOGASTRODUODENOSCOPY (EGD) WITH ESOPHAGEAL DILATION N/A 11/22/2012   Procedure: ESOPHAGOGASTRODUODENOSCOPY (EGD) WITH ESOPHAGEAL DILATION;  Surgeon: Rogene Houston, MD;  Location: [facility] SUITE;  Service: Endoscopy;  Laterality: N/A;  1120  . HEMORRHOID SURGERY    . NASAL SEPTUM SURGERY    . SHOULDER SURGERY Right   . TONSILLECTOMY AND ADENOIDECTOMY       SOCIAL HISTORY:  Social History   Social History  . Marital status: Divorced    Spouse name: N/A  . Number of children: N/A  . Years of education: N/A   Occupational History  . Not on file.   Social History Main Topics  . Smoking status: Never Smoker  . Smokeless tobacco: Never Used  . Alcohol use No  . Drug use: No  . Sexual activity: Not Currently    Birth control/ protection: None   Other Topics Concern  . Not on file   Social History Narrative  . No narrative on file  FAMILY HISTORY:  Family History  Problem Relation Age of Onset  . Hypertension Mother   . Heart attack Mother   . Heart attack Father   . Diabetes Brother     CURRENT MEDICATIONS:  Outpatient Encounter Prescriptions as of 12/22/2016  Medication Sig  . levothyroxine (SYNTHROID, LEVOTHROID) 112 MCG tablet Take 1 tablet (112 mcg total) by mouth daily.   Facility-Administered Encounter Medications as of 12/22/2016  Medication  . 0.9 %  sodium chloride infusion  . acetaminophen (TYLENOL) tablet 650 mg  . diphenhydrAMINE (BENADRYL) capsule 25 mg  . sodium chloride flush (NS) 0.9  % injection 10 mL  . sodium chloride flush (NS) 0.9 % injection 10 mL  . sodium chloride flush (NS) 0.9 % injection 10 mL    ALLERGIES:  No Known Allergies   PHYSICAL EXAM:  ECOG Performance status: 2 - Symptomatic; requires periodic assistance.   Vitals:   12/22/16 0909  BP: (!) 145/50  Pulse: 78  Resp: 16  SpO2: 100%   Filed Weights   12/22/16 0909  Weight: 185 lb 4.8 oz (84.1 kg)    Physical Exam  Constitutional: He is oriented to person, place, and time.  Chronically-ill appearing male in no acute distress   HENT:  Head: Normocephalic.  Mouth/Throat: Oropharynx is clear and moist. No oropharyngeal exudate.  Mucous membranes pale   Eyes: Pupils are equal, round, and reactive to light. Conjunctivae are normal. No scleral icterus.  Neck: Normal range of motion. Neck supple.  Cardiovascular: Normal rate and regular rhythm.   Pulmonary/Chest: Effort normal and breath sounds normal. No respiratory distress. He has no wheezes. He has no rales.  Abdominal: Soft. Bowel sounds are normal. There is no tenderness. There is no rebound.  Musculoskeletal: Normal range of motion. He exhibits no edema.  Lymphadenopathy:    He has no cervical adenopathy.       Right: No supraclavicular adenopathy present.       Left: No supraclavicular adenopathy present.  Neurological: He is alert and oriented to person, place, and time. No cranial nerve deficit.  Skin: Skin is warm and dry. No rash noted.  Psychiatric: Mood, memory, affect and judgment normal.  Nursing note and vitals reviewed.    LABORATORY DATA:  I have reviewed the labs as listed.  CBC    Component Value Date/Time   WBC 1.9 (L) 12/22/2016 0821   RBC 2.67 (L) 12/22/2016 0821   HGB 8.4 (L) 12/22/2016 0821   HGB 6.7 (LL) 06/23/2016 0825   HCT 25.7 (L) 12/22/2016 0821   HCT 20.1 (L) 06/23/2016 0825   PLT 147 (L) 12/22/2016 0821   PLT 182 06/23/2016 0825   MCV 96.3 12/22/2016 0821   MCV 106 (H) 06/23/2016 0825   MCH  31.5 12/22/2016 0821   MCHC 32.7 12/22/2016 0821   RDW 22.1 (H) 12/22/2016 0821   RDW 16.4 (H) 06/23/2016 0825   LYMPHSABS 0.9 12/22/2016 0821   LYMPHSABS 0.9 06/23/2016 0825   MONOABS 0.1 12/22/2016 0821   EOSABS 0.0 12/22/2016 0821   EOSABS 0.0 06/23/2016 0825   BASOSABS 0.0 12/22/2016 0821   BASOSABS 0.0 06/23/2016 0825   CMP Latest Ref Rng & Units 09/08/2016 08/10/2016 07/26/2016  Glucose 65 - 99 mg/dL 110(H) 97 98  BUN 6 - 20 mg/dL _0 Creatinine 0.61 - 1.24 mg/dL 1.09 0.98 0.92  Sodium 135 - 145 mmol/L 137 137 134(L)  Potassium 3.5 - 5.1 mmol/L 3.6 4.0 4.4  Chloride 101 - 111  mmol/L 105 106 103  CO2 22 - 32 mmol/L _0 Calcium 8.9 - 10.3 mg/dL 8.7(L) 8.5(L) 8.7(L)  Total Protein 6.5 - 8.1 g/dL 6.6 6.6 6.7  Total Bilirubin 0.3 - 1.2 mg/dL 1.3(H) 1.0 1.4(H)  Alkaline Phos 38 - 126 U/L 42 44 47  AST 15 - 41 U/L _1 ALT 17 - 63 U/L _2 PENDING LABS:    DIAGNOSTIC IMAGING:    PATHOLOGY:  Bone marrow biopsy: 07/28/16       ASSESSMENT & PLAN:   Myelodysplastic syndrome & Symptomatic anemia: -We discussed the nature of his disease, which is progressive in nature. He expressed confusion on the use of [name]; it was his understanding that the [name] was helping his MDS. I reviewed with him the role of [name] in the setting of malignancy to ideally increase the amount of time between the need for transfusion.  We reviewed that Dr. Talbert Cage does not recommend active chemotherapy treatment for his MDS given his age, comorbid conditions, and declining performance status.  Therefore, he would also not be an ideal candidate for bone marrow transplant.  Therefore, we have initiated treatment to help with his symptomatic anemia, but we are not actively treating the MDS.  His biggest frustration is around "not being able to do any of the things I used to do" as it relates to his fatigue, decreased stamina, shortness of breath with exertion, and weakness.  -I  offered him referral for second opinion to [facility] or any other institution where he could be evaluated, as they may have different treatment options available to him. He wants to think about this; we are happy to facilitate this referral if he chooses.  -Hgb 8.4 g/dL today. The patient and his caretaker state that he feels like his weakness, fatigue, and shortness of breath have been worse in recent weeks. His hemoglobin has largely been stable in the 8 range. However, given his symptoms, we will transfuse 1 unit PRBCs today in clinic.  -He is on max dose and max scheduling of [name] at 300 mcg weekly. Discussed with Dr. Talbert Cage and will maintain [name] 300 mcg weekly for now. Will continue to provide blood transfusions when needed as well.    -Return to cancer center for follow-up in 2 months.   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 WBCs 1.9 with ANC 1000 today. Platelets only mildly low at 147,000.  -Will keep monitoring.   Decreased stamina/energy/weakness:  -Offered him referral to PT/OT to help him build/regain some strength and endurance.  He states, "I just need somebody to tell me what to do and I can do it at home."  Commended him for being motivated to do activities, but PT/OT professionals would be the best to guide him on specific exercises he should do.  He declines referral at this time; encouraged him to let us know if he changes his mind.        Dispo:  -Continue [name] weekly with labs. He wishes to maintain Friday AM appointments. Afternoon appointments are not good, given his increased requirement for blood transfusions and coordination of care/timeframe needed to give transfusion.  -Return to cancer center for follow-up in 2 months.    All questions were answered to patient's stated satisfaction. Encouraged patient to call with any new concerns or questions before his next visit to the cancer center and we can certain see him sooner, if needed.  Plan of care discussed with Dr. Talbert Cage, who agrees with the above aforementioned.    Orders placed this encounter:  Orders Placed This Encounter  Procedures  . CBC with Differential/Platelet  . Sample to [place], [place] [phone]

## 2016-12-22 NOTE — Progress Notes (Signed)
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  Patient on aranesp.  Past medical history of HLD, hypothyroidism, reflux  Patient seen in infusion today, planning blood transfusion.  Phillips Hay not present during visit.  Patient reports no appetite but has been forcing self to eat.  Also reports foods have "off" taste.  Reports typically has cheese omelet with fruit and doughnut or muffin for breakfast, lunch if he remembers and light supper of mostly veggies and fruit.  Reports that he does not really get hungry anymore and sometimes can't remember to eat (My memory is shot).  Patient buys organic foods and does not buy much organic meat due to cost.    Reports some nausea at times but usually because he has not eaten in awhile.    Reports regular bowel movement.   Nutrition Focused Physical Exam: deferred.  Patient reports has lost muscle mass.   Feels week and fatigued most days.    Medications: reviewed  Labs: reviewed  Anthropometrics:   Height: 67 inches Weight: 185 lb 4.8 oz today UBW: 200s BMI: 30  5% weight loss in 2 months, not significant Otherwise weight has been fairly stable until today's weight.  Estimated Energy Needs  Kcals: 2100-2500 calories/d Protein: 105-125 g/d Fluid: > 2.1 L/d  NUTRITION DIAGNOSIS: Inadequate oral intake related to fatigue, poor appetite, ?? Memory changes as evidenced by 5% weight loss in the last 2 months   MALNUTRITION DIAGNOSIS: continue to monitor   INTERVENTION:   Discussed foods to eat with anemia.  Fact sheet given to patient and copy for patient to give to [facility]. Encouraged patient set alarm to remind himself to eat Encouraged good sources of protein and discussed less expensive options as patient wants to buy organic foods but avoiding meat because he can't afford it.  Discussed research  regarding organic foods as well.  Discussed ensure/boost supplements and patient does not like them.  They taste chalky and he does not want to drink them.  Provided patient with recipes of high calorie, high protein shakes to try.   Contact information provided.     MONITORING, EVALUATION, GOAL: weight trends, intake   NEXT VISIT: October 5th   Kent Riendeau B. Zenia Resides, [place], [place] Registered Dietitian [phone] (pager)

## 2016-12-22 NOTE — Progress Notes (Signed)
Patient tolerated treatment without incidence. Patient's [name] was held due to patient having blood transfusion today.  Dr. Talbert Cage aware and okay to postpone until next week at scheduled time.  Patient discharged ambulatory and in stable condition from clinic. Patient to follow up as scheduled.

## 2016-12-23 LAB — BPAM RBC
BLOOD PRODUCT EXPIRATION DATE: 201809202359
ISSUE DATE / TIME: 201809071118
UNIT TYPE AND RH: 6200

## 2016-12-23 LAB — TYPE AND SCREEN
ABO/RH(D): A POS
ANTIBODY SCREEN: NEGATIVE
UNIT DIVISION: 0

## 2016-12-29 ENCOUNTER — Other Ambulatory Visit (HOSPITAL_COMMUNITY): Payer: Medicare HMO

## 2016-12-29 ENCOUNTER — Ambulatory Visit (HOSPITAL_COMMUNITY): Payer: Medicare HMO

## 2017-01-01 ENCOUNTER — Encounter (HOSPITAL_COMMUNITY): Payer: Self-pay

## 2017-01-01 ENCOUNTER — Encounter (HOSPITAL_BASED_OUTPATIENT_CLINIC_OR_DEPARTMENT_OTHER): Payer: Medicare HMO

## 2017-01-01 ENCOUNTER — Encounter (HOSPITAL_COMMUNITY): Payer: Medicare HMO

## 2017-01-01 VITALS — BP 114/66 | HR 82 | Temp 99.8°F | Resp 18

## 2017-01-01 DIAGNOSIS — D649 Anemia, unspecified: Secondary | ICD-10-CM | POA: Diagnosis not present

## 2017-01-01 DIAGNOSIS — D4621 Refractory anemia with excess of blasts 1: Secondary | ICD-10-CM | POA: Diagnosis not present

## 2017-01-01 DIAGNOSIS — D464 Refractory anemia, unspecified: Secondary | ICD-10-CM | POA: Diagnosis not present

## 2017-01-01 DIAGNOSIS — D709 Neutropenia, unspecified: Secondary | ICD-10-CM

## 2017-01-01 LAB — CBC WITH DIFFERENTIAL/PLATELET
BASOS PCT: 0 %
Basophils Absolute: 0 10*3/uL (ref 0.0–0.1)
EOS ABS: 0 10*3/uL (ref 0.0–0.7)
Eosinophils Relative: 0 %
HCT: 25.6 % — ABNORMAL LOW (ref 39.0–52.0)
Hemoglobin: 8.5 g/dL — ABNORMAL LOW (ref 13.0–17.0)
LYMPHS ABS: 0.8 10*3/uL (ref 0.7–4.0)
LYMPHS PCT: 58 %
MCH: 31.6 pg (ref 26.0–34.0)
MCHC: 33.2 g/dL (ref 30.0–36.0)
MCV: 95.2 fL (ref 78.0–100.0)
Monocytes Absolute: 0 10*3/uL — ABNORMAL LOW (ref 0.1–1.0)
Monocytes Relative: 1 %
NEUTROS PCT: 41 %
Neutro Abs: 0.6 10*3/uL — ABNORMAL LOW (ref 1.7–7.7)
Platelets: 125 10*3/uL — ABNORMAL LOW (ref 150–400)
RBC: 2.69 MIL/uL — ABNORMAL LOW (ref 4.22–5.81)
RDW: 20.8 % — AB (ref 11.5–15.5)
WBC: 1.5 10*3/uL — ABNORMAL LOW (ref 4.0–10.5)

## 2017-01-01 MED ORDER — DARBEPOETIN ALFA 300 MCG/0.6ML IJ SOSY
PREFILLED_SYRINGE | INTRAMUSCULAR | Status: AC
  Filled 2017-01-01: qty 0.6

## 2017-01-01 MED ORDER — DARBEPOETIN ALFA 300 MCG/0.6ML IJ SOSY
300.0000 ug | PREFILLED_SYRINGE | Freq: Once | INTRAMUSCULAR | Status: AC
  Administered 2017-01-01: 300 ug via SUBCUTANEOUS

## 2017-01-01 NOTE — Progress Notes (Signed)
Due to unforseen circumstances, patient had to miss last Friday's injection, pushing him up until today.  Patient is unable to come on Monday's for injection so he is concerned about his appointments this week.  Okay per Dr. Talbert Cage for patient to miss this week's injection appointment on Friday due to it being to early and him following up next Friday 09/28 to resume injections.  Patient aware and is agreeable.  Patient given copy of follow up schedule.

## 2017-01-01 NOTE — Progress Notes (Signed)
Omar Lee presents today for injection per MD orders. [name] 300 mcg administered SQ in right lower abdomen. Administration without incident. Patient tolerated well. Patient tolerated treatment without incidence. Patient discharged ambulatory and in stable condition from clinic. Patient to follow up as scheduled.

## 2017-01-01 NOTE — Patient Instructions (Signed)
[place] at [facility] Discharge Instructions  RECOMMENDATIONS MADE BY THE CONSULTANT AND ANY TEST RESULTS WILL BE SENT TO YOUR REFERRING PHYSICIAN.  You received your [name] injection today. Your Hgb is 8.5. Continue to get your injection weekly. Follow up as scheduled.  Thank you for choosing [place] at [facility] to provide your oncology and hematology care.  To afford each patient quality time with our provider, please arrive at least 15 minutes before your scheduled appointment time.    If you have a lab appointment with the [facility] please come in thru the  Main Entrance and check in at the main information desk  You need to re-schedule your appointment should you arrive 10 or more minutes late.  We strive to give you quality time with our providers, and arriving late affects you and other patients whose appointments are after yours.  Also, if you no show three or more times for appointments you may be dismissed from the clinic at the providers discretion.     Again, thank you for choosing [facility].  Our hope is that these requests will decrease the amount of time that you wait before being seen by our physicians.       _____________________________________________________________  Should you have questions after your visit to [facility], please contact our office at [phone] between the hours of 8:30 a.m. and 4:30 p.m.  Voicemails left after 4:30 p.m. will not be returned until the following business day.  For prescription refill requests, have your pharmacy contact our office.       Resources For Cancer Patients and their Caregivers ? American Cancer Society: Can assist with transportation, wigs, general needs, runs Look Good Feel Better.        825-638-9152 ? Cancer Care: Provides financial assistance, online support groups, medication/co-pay assistance.  1-800-813-HOPE  (646)461-0870) ? [facility] Assists [place] cancer patients and their families through emotional , educational and financial support.  (480)633-7539 ? [place] DSS Where to apply for food stamps, Medicaid and utility assistance. (613)278-5287 ? RCATS: Transportation to medical appointments. 228-357-5857 ? Social Security Administration: May apply for disability if have a Stage IV cancer. (619)656-0058 (406)484-3836 ? [employer], Disability and Transit Services: Assists with nutrition, care and transit needs. [place] Support Programs: @10RELATIVEDAYS @ > Cancer Support Group  2nd Tuesday of the month 1pm-2pm, Journey Room  > Creative Journey  3rd Tuesday of the month 1130am-1pm, Journey Room  > Look Good Feel Better  1st Wednesday of the month 10am-12 noon, Journey Room (Call [place] to register [phone])

## 2017-01-05 ENCOUNTER — Ambulatory Visit (HOSPITAL_COMMUNITY): Payer: Medicare HMO

## 2017-01-05 ENCOUNTER — Other Ambulatory Visit (HOSPITAL_COMMUNITY): Payer: Medicare HMO

## 2017-01-12 ENCOUNTER — Encounter (HOSPITAL_COMMUNITY): Payer: Self-pay

## 2017-01-12 ENCOUNTER — Encounter (HOSPITAL_BASED_OUTPATIENT_CLINIC_OR_DEPARTMENT_OTHER): Payer: Medicare HMO

## 2017-01-12 ENCOUNTER — Encounter (HOSPITAL_COMMUNITY): Payer: Medicare HMO

## 2017-01-12 ENCOUNTER — Other Ambulatory Visit: Payer: Self-pay | Admitting: Hematology and Oncology

## 2017-01-12 VITALS — BP 114/54 | HR 69 | Temp 98.2°F | Resp 16

## 2017-01-12 DIAGNOSIS — D709 Neutropenia, unspecified: Secondary | ICD-10-CM | POA: Diagnosis not present

## 2017-01-12 DIAGNOSIS — D649 Anemia, unspecified: Secondary | ICD-10-CM

## 2017-01-12 DIAGNOSIS — D464 Refractory anemia, unspecified: Secondary | ICD-10-CM

## 2017-01-12 DIAGNOSIS — D4621 Refractory anemia with excess of blasts 1: Secondary | ICD-10-CM

## 2017-01-12 LAB — CBC WITH DIFFERENTIAL/PLATELET
Basophils Absolute: 0 10*3/uL (ref 0.0–0.1)
Basophils Relative: 0 %
EOS ABS: 0 10*3/uL (ref 0.0–0.7)
Eosinophils Relative: 1 %
HCT: 23.9 % — ABNORMAL LOW (ref 39.0–52.0)
HEMOGLOBIN: 7.9 g/dL — AB (ref 13.0–17.0)
LYMPHS ABS: 0.8 10*3/uL (ref 0.7–4.0)
Lymphocytes Relative: 54 %
MCH: 31.9 pg (ref 26.0–34.0)
MCHC: 33.1 g/dL (ref 30.0–36.0)
MCV: 96.4 fL (ref 78.0–100.0)
MONO ABS: 0 10*3/uL — AB (ref 0.1–1.0)
MONOS PCT: 1 %
NEUTROS ABS: 0.6 10*3/uL — AB (ref 1.7–7.7)
NEUTROS PCT: 45 %
Platelets: 148 10*3/uL — ABNORMAL LOW (ref 150–400)
RBC: 2.48 MIL/uL — ABNORMAL LOW (ref 4.22–5.81)
RDW: 21.5 % — ABNORMAL HIGH (ref 11.5–15.5)
WBC: 1.4 10*3/uL — CL (ref 4.0–10.5)

## 2017-01-12 LAB — PREPARE RBC (CROSSMATCH)

## 2017-01-12 MED ORDER — DARBEPOETIN ALFA 300 MCG/0.6ML IJ SOSY
300.0000 ug | PREFILLED_SYRINGE | Freq: Once | INTRAMUSCULAR | Status: AC
  Administered 2017-01-12: 300 ug via SUBCUTANEOUS
  Filled 2017-01-12: qty 0.6

## 2017-01-12 MED ORDER — SODIUM CHLORIDE 0.9 % IV SOLN
250.0000 mL | Freq: Once | INTRAVENOUS | Status: AC
  Administered 2017-01-12: 250 mL via INTRAVENOUS

## 2017-01-12 MED ORDER — SODIUM CHLORIDE 0.9% FLUSH: 3.0000 mL | INTRAVENOUS | Status: DC | PRN

## 2017-01-12 MED ORDER — DIPHENHYDRAMINE HCL 25 MG PO CAPS
25.0000 mg | ORAL_CAPSULE | Freq: Once | ORAL | Status: AC
  Administered 2017-01-12: 25 mg via ORAL
  Filled 2017-01-12: qty 1

## 2017-01-12 MED ORDER — ACETAMINOPHEN 325 MG PO TABS
650.0000 mg | ORAL_TABLET | Freq: Once | ORAL | Status: AC
  Administered 2017-01-12: 650 mg via ORAL
  Filled 2017-01-12: qty 2

## 2017-01-12 NOTE — Patient Instructions (Signed)
[place] at [facility] Discharge Instructions  RECOMMENDATIONS MADE BY THE CONSULTANT AND ANY TEST RESULTS WILL BE SENT TO YOUR REFERRING PHYSICIAN.  Received blood transfusion and [name] injection today. Follow-up as scheduled. Call clinic for any questions or concerns  Thank you for choosing [place] at [facility] to provide your oncology and hematology care.  To afford each patient quality time with our provider, please arrive at least 15 minutes before your scheduled appointment time.    If you have a lab appointment with the [place] please come in thru the  Main Entrance and check in at the main information desk  You need to re-schedule your appointment should you arrive 10 or more minutes late.  We strive to give you quality time with our providers, and arriving late affects you and other patients whose appointments are after yours.  Also, if you no show three or more times for appointments you may be dismissed from the clinic at the providers discretion.     Again, thank you for choosing [facility].  Our hope is that these requests will decrease the amount of time that you wait before being seen by our physicians.       _____________________________________________________________  Should you have questions after your visit to [facility], please contact our office at [phone] between the hours of 8:30 a.m. and 4:30 p.m.  Voicemails left after 4:30 p.m. will not be returned until the following business day.  For prescription refill requests, have your pharmacy contact our office.       Resources For Cancer Patients and their Caregivers ? American Cancer Society: Can assist with transportation, wigs, general needs, runs Look Good Feel Better.        641-874-2513 ? Cancer Care: Provides financial assistance, online support groups, medication/co-pay assistance.  1-800-813-HOPE 337 280 5722) ? [facility] Assists [place] cancer patients and their families through emotional , educational and financial support.  (469)536-8128 ? [place] DSS Where to apply for food stamps, Medicaid and utility assistance. 2201968851 ? RCATS: Transportation to medical appointments. 304-432-9604 ? Social Security Administration: May apply for disability if have a Stage IV cancer. 870-114-6865 (805) 288-8724 ? [employer], Disability and Transit Services: Assists with nutrition, care and transit needs. [place] Support Programs: @10RELATIVEDAYS @ > Cancer Support Group  2nd Tuesday of the month 1pm-2pm, Journey Room  > Creative Journey  3rd Tuesday of the month 1130am-1pm, Journey Room  > Look Good Feel Better  1st Wednesday of the month 10am-12 noon, Journey Room (Call [place] to register [phone])

## 2017-01-12 NOTE — Progress Notes (Signed)
CRITICAL VALUE ALERT Critical value received:  WBC- 1.4  Date of notification:  01/12/17 Time of notification: 2683 Critical value read back:  Yes.   Nurse who received alert:  M.Delfina Schreurs, LPN MD notified (1st page):  M. Lebron Conners, MD

## 2017-01-12 NOTE — Progress Notes (Signed)
Omar Lee tolerated transfusion of 1 unit of PRBC's and [name] injection well without complaints . Labs reviewed with Dr. Lebron Conners this morning and orders for transfusion given per MD.Hgb 7.9 and pt was feeling symptomatic. VSS upon discharge. Pt discharged self ambulatory in satisfactory condition

## 2017-01-13 LAB — TYPE AND SCREEN
ABO/RH(D): A POS
ANTIBODY SCREEN: NEGATIVE
Unit division: 0

## 2017-01-13 LAB — BPAM RBC
Blood Product Expiration Date: 201810052359
ISSUE DATE / TIME: 201809281139
UNIT TYPE AND RH: 5100

## 2017-01-19 ENCOUNTER — Encounter (HOSPITAL_COMMUNITY): Payer: Medicare HMO

## 2017-01-19 ENCOUNTER — Encounter (HOSPITAL_COMMUNITY): Payer: Medicare HMO | Attending: Oncology

## 2017-01-19 ENCOUNTER — Encounter (HOSPITAL_COMMUNITY): Payer: Self-pay

## 2017-01-19 ENCOUNTER — Encounter (HOSPITAL_BASED_OUTPATIENT_CLINIC_OR_DEPARTMENT_OTHER): Payer: Medicare HMO

## 2017-01-19 VITALS — BP 127/66 | HR 70 | Temp 98.4°F | Resp 16

## 2017-01-19 DIAGNOSIS — D464 Refractory anemia, unspecified: Secondary | ICD-10-CM

## 2017-01-19 DIAGNOSIS — D709 Neutropenia, unspecified: Secondary | ICD-10-CM | POA: Insufficient documentation

## 2017-01-19 DIAGNOSIS — D649 Anemia, unspecified: Secondary | ICD-10-CM

## 2017-01-19 DIAGNOSIS — D4621 Refractory anemia with excess of blasts 1: Secondary | ICD-10-CM | POA: Diagnosis not present

## 2017-01-19 LAB — CBC WITH DIFFERENTIAL/PLATELET
Basophils Absolute: 0 10*3/uL (ref 0.0–0.1)
Basophils Relative: 0 %
Eosinophils Absolute: 0 10*3/uL (ref 0.0–0.7)
Eosinophils Relative: 0 %
HEMATOCRIT: 24.7 % — AB (ref 39.0–52.0)
HEMOGLOBIN: 8.1 g/dL — AB (ref 13.0–17.0)
Lymphocytes Relative: 44 %
Lymphs Abs: 0.7 10*3/uL (ref 0.7–4.0)
MCH: 31.6 pg (ref 26.0–34.0)
MCHC: 32.8 g/dL (ref 30.0–36.0)
MCV: 96.5 fL (ref 78.0–100.0)
MONO ABS: 0 10*3/uL — AB (ref 0.1–1.0)
MONOS PCT: 2 %
NEUTROS ABS: 0.9 10*3/uL — AB (ref 1.7–7.7)
NEUTROS PCT: 55 %
Platelets: 138 10*3/uL — ABNORMAL LOW (ref 150–400)
RBC: 2.56 MIL/uL — ABNORMAL LOW (ref 4.22–5.81)
RDW: 20.4 % — ABNORMAL HIGH (ref 11.5–15.5)
WBC: 1.7 10*3/uL — ABNORMAL LOW (ref 4.0–10.5)

## 2017-01-19 MED ORDER — DARBEPOETIN ALFA 500 MCG/ML IJ SOSY
PREFILLED_SYRINGE | INTRAMUSCULAR | Status: AC
  Filled 2017-01-19: qty 1

## 2017-01-19 MED ORDER — DARBEPOETIN ALFA 500 MCG/ML IJ SOSY
500.0000 ug | PREFILLED_SYRINGE | Freq: Once | INTRAMUSCULAR | Status: AC
  Administered 2017-01-19: 500 ug via SUBCUTANEOUS

## 2017-01-19 NOTE — Progress Notes (Signed)
To treatment area for [name].  Hgb 8.1 today and patient stated he does not feel like he needs a blood transfusion today.  Stated he feels better.  Complaints of phlegm and has not tried any OTC medications for relief.  Denied fevers, sinus drainage, and cannot state what color or consistency of the phlegm.  Reviewed with Dr. Talbert Cage and mucinex recommended.  Patient verbalized understanding.    [name] shot given in abdomen with no complaints voiced.  Site clean and dry with no bruising or swelling noted.  Band aid applied.  VSS with discharge and left ambulatory with family.  No s/s of distress noted.

## 2017-01-19 NOTE — Patient Instructions (Signed)
[place] at [facility]  Discharge Instructions:  [name] shot given today.  Call for any problems or concerns.  Keep next scheduled appointment.  _______________________________________________________________  Thank you for choosing [place] at [facility] to provide your oncology and hematology care.  To afford each patient quality time with our providers, please arrive at least 15 minutes before your scheduled appointment.  You need to re-schedule your appointment if you arrive 10 or more minutes late.  We strive to give you quality time with our providers, and arriving late affects you and other patients whose appointments are after yours.  Also, if you no show three or more times for appointments you may be dismissed from the clinic.  Again, thank you for choosing [place] at [place] hope is that these requests will allow you access to exceptional care and in a timely manner. _______________________________________________________________  If you have questions after your visit, please contact our office at [phone] between the hours of 8:30 a.m. and 5:00 p.m. Voicemails left after 4:30 p.m. will not be returned until the following business day. _______________________________________________________________  For prescription refill requests, have your pharmacy contact our office. _______________________________________________________________  Recommendations made by the consultant and any test results will be sent to your referring physician. _______________________________________________________________

## 2017-01-19 NOTE — Progress Notes (Signed)
Nutrition Follow-up:  Met with patient and caregiver Otila Kluver this am before injection.    Patient reports appetite is a little better.  Reports that his taste buds improve after he starts eating.  Continues to eat cheese omelet with pastry and coffee in the am.  Then about 5 hours later will have hamburger sandwich (aviods deli meats).  Reports that he eats dinner around 5-7pm of tomato sandwich or soup or beef strognoff that friend prepares.  Reports that he eats peanut butter all the time and snacks on nuts.  Patient prefers to buy organic foods and eats foods with less sugar and artifical sweetners.     Medications: reviewed  Labs: reviewed  Anthropometrics:   No new weight since 9/7 weight of 185 lb 4.8 oz.   NUTRITION DIAGNOSIS: Inadequate oral intake improving    MALNUTRITION DIAGNOSIS: continue to monitor   INTERVENTION:   Patient reports that he lost fact sheet on anemia. Re-printed fact sheet and gave one to patient and caregiver Otila Kluver. Reviewed foods high in protein and calories.   Re-printed recipes for homemade oral nutrition shakes as does not like the ensure/boost shakes.  Provided sample of boost breeze as asking about juice type supplements.   Contact information provided as well    MONITORING, EVALUATION, GOAL: weight trends, intake   NEXT VISIT: November 2nd  Saranda Legrande B. Zenia Resides, [place], [place] Registered Dietitian [phone] (pager)

## 2017-01-26 ENCOUNTER — Encounter (HOSPITAL_COMMUNITY): Payer: Medicare HMO

## 2017-01-26 ENCOUNTER — Encounter (HOSPITAL_COMMUNITY): Payer: Self-pay

## 2017-01-26 ENCOUNTER — Encounter (HOSPITAL_BASED_OUTPATIENT_CLINIC_OR_DEPARTMENT_OTHER): Payer: Medicare HMO

## 2017-01-26 VITALS — BP 136/65 | HR 72 | Temp 97.6°F | Resp 16

## 2017-01-26 DIAGNOSIS — D464 Refractory anemia, unspecified: Secondary | ICD-10-CM

## 2017-01-26 DIAGNOSIS — D4621 Refractory anemia with excess of blasts 1: Secondary | ICD-10-CM

## 2017-01-26 DIAGNOSIS — D709 Neutropenia, unspecified: Secondary | ICD-10-CM

## 2017-01-26 DIAGNOSIS — D649 Anemia, unspecified: Secondary | ICD-10-CM

## 2017-01-26 LAB — CBC WITH DIFFERENTIAL/PLATELET
BASOS PCT: 0 %
Basophils Absolute: 0 10*3/uL (ref 0.0–0.1)
EOS ABS: 0 10*3/uL (ref 0.0–0.7)
Eosinophils Relative: 0 %
HCT: 25.6 % — ABNORMAL LOW (ref 39.0–52.0)
Hemoglobin: 8.4 g/dL — ABNORMAL LOW (ref 13.0–17.0)
LYMPHS ABS: 0.8 10*3/uL (ref 0.7–4.0)
Lymphocytes Relative: 55 %
MCH: 32.3 pg (ref 26.0–34.0)
MCHC: 32.8 g/dL (ref 30.0–36.0)
MCV: 98.5 fL (ref 78.0–100.0)
MONO ABS: 0 10*3/uL — AB (ref 0.1–1.0)
Monocytes Relative: 1 %
NEUTROS ABS: 0.7 10*3/uL — AB (ref 1.7–7.7)
NEUTROS PCT: 44 %
PLATELETS: 142 10*3/uL — AB (ref 150–400)
RBC: 2.6 MIL/uL — ABNORMAL LOW (ref 4.22–5.81)
RDW: 21.1 % — AB (ref 11.5–15.5)
WBC: 1.5 10*3/uL — ABNORMAL LOW (ref 4.0–10.5)

## 2017-01-26 MED ORDER — DARBEPOETIN ALFA 500 MCG/ML IJ SOSY
500.0000 ug | PREFILLED_SYRINGE | Freq: Once | INTRAMUSCULAR | Status: AC
  Administered 2017-01-26: 500 ug via SUBCUTANEOUS

## 2017-01-26 NOTE — Progress Notes (Signed)
Patient tolerated [name] shot with no complaints voiced.  Stated doing well and denied SOB.  Only SOB with a lot of walking or exertion.  Refused blood transfusion today and instructed to call if he feels on Monday he may need one or to go to the ER over the weekend if needed.  Verbalized understanding.  Injection site clean and dry with no bruising or swelling noted.  Band aid applied.  VSS with discharge and left ambulatory with no s/s of distress.

## 2017-01-26 NOTE — Patient Instructions (Signed)
[place] at [facility]  Discharge Instructions:  You received an aranesp shot today.  Call for any questions or problems.   _______________________________________________________________  Thank you for choosing [place] at [facility] to provide your oncology and hematology care.  To afford each patient quality time with our providers, please arrive at least 15 minutes before your scheduled appointment.  You need to re-schedule your appointment if you arrive 10 or more minutes late.  We strive to give you quality time with our providers, and arriving late affects you and other patients whose appointments are after yours.  Also, if you no show three or more times for appointments you may be dismissed from the clinic.  Again, thank you for choosing [place] at [place] hope is that these requests will allow you access to exceptional care and in a timely manner. _______________________________________________________________  If you have questions after your visit, please contact our office at [phone] between the hours of 8:30 a.m. and 5:00 p.m. Voicemails left after 4:30 p.m. will not be returned until the following business day. _______________________________________________________________  For prescription refill requests, have your pharmacy contact our office. _______________________________________________________________  Recommendations made by the consultant and any test results will be sent to your referring physician. _______________________________________________________________

## 2017-02-02 ENCOUNTER — Encounter (HOSPITAL_COMMUNITY): Payer: Self-pay

## 2017-02-02 ENCOUNTER — Encounter (HOSPITAL_BASED_OUTPATIENT_CLINIC_OR_DEPARTMENT_OTHER): Payer: Medicare HMO

## 2017-02-02 ENCOUNTER — Encounter (HOSPITAL_COMMUNITY): Payer: Medicare HMO

## 2017-02-02 VITALS — BP 133/66 | HR 95 | Resp 18

## 2017-02-02 DIAGNOSIS — D709 Neutropenia, unspecified: Secondary | ICD-10-CM | POA: Diagnosis not present

## 2017-02-02 DIAGNOSIS — D649 Anemia, unspecified: Secondary | ICD-10-CM

## 2017-02-02 DIAGNOSIS — D464 Refractory anemia, unspecified: Secondary | ICD-10-CM

## 2017-02-02 DIAGNOSIS — D4621 Refractory anemia with excess of blasts 1: Secondary | ICD-10-CM

## 2017-02-02 DIAGNOSIS — D508 Other iron deficiency anemias: Secondary | ICD-10-CM

## 2017-02-02 LAB — CBC WITH DIFFERENTIAL/PLATELET
BASOS PCT: 0 %
Basophils Absolute: 0 10*3/uL (ref 0.0–0.1)
EOS PCT: 0 %
Eosinophils Absolute: 0 10*3/uL (ref 0.0–0.7)
HCT: 24.6 % — ABNORMAL LOW (ref 39.0–52.0)
Hemoglobin: 8 g/dL — ABNORMAL LOW (ref 13.0–17.0)
LYMPHS ABS: 0.8 10*3/uL (ref 0.7–4.0)
Lymphocytes Relative: 49 %
MCH: 32.3 pg (ref 26.0–34.0)
MCHC: 32.5 g/dL (ref 30.0–36.0)
MCV: 99.2 fL (ref 78.0–100.0)
MONO ABS: 0 10*3/uL — AB (ref 0.1–1.0)
Monocytes Relative: 1 %
NEUTROS PCT: 50 %
Neutro Abs: 0.9 10*3/uL — ABNORMAL LOW (ref 1.7–7.7)
PLATELETS: 143 10*3/uL — AB (ref 150–400)
RBC: 2.48 MIL/uL — ABNORMAL LOW (ref 4.22–5.81)
RDW: 21.9 % — ABNORMAL HIGH (ref 11.5–15.5)
WBC: 1.7 10*3/uL — ABNORMAL LOW (ref 4.0–10.5)

## 2017-02-02 LAB — SAMPLE TO BLOOD BANK

## 2017-02-02 MED ORDER — DARBEPOETIN ALFA 500 MCG/ML IJ SOSY
500.0000 ug | PREFILLED_SYRINGE | Freq: Once | INTRAMUSCULAR | Status: AC
  Administered 2017-02-02: 500 ug via SUBCUTANEOUS

## 2017-02-02 NOTE — Patient Instructions (Addendum)
[place] at [facility] Discharge Instructions  RECOMMENDATIONS MADE BY THE CONSULTANT AND ANY TEST RESULTS WILL BE SENT TO YOUR REFERRING PHYSICIAN.  You received your [name] injection today Your Hgb is 8.0 today Follow up next week as scheduled.  Thank you for choosing [place] at [facility] to provide your oncology and hematology care.  To afford each patient quality time with our provider, please arrive at least 15 minutes before your scheduled appointment time.    If you have a lab appointment with the [place] please come in thru the  Main Entrance and check in at the main information desk  You need to re-schedule your appointment should you arrive 10 or more minutes late.  We strive to give you quality time with our providers, and arriving late affects you and other patients whose appointments are after yours.  Also, if you no show three or more times for appointments you may be dismissed from the clinic at the providers discretion.     Again, thank you for choosing [facility].  Our hope is that these requests will decrease the amount of time that you wait before being seen by our physicians.       _____________________________________________________________  Should you have questions after your visit to [facility], please contact our office at [phone] between the hours of 8:30 a.m. and 4:30 p.m.  Voicemails left after 4:30 p.m. will not be returned until the following business day.  For prescription refill requests, have your pharmacy contact our office.       Resources For Cancer Patients and their Caregivers ? American Cancer Society: Can assist with transportation, wigs, general needs, runs Look Good Feel Better.        707-627-3831 ? Cancer Care: Provides financial assistance, online support groups, medication/co-pay assistance.  1-800-813-HOPE 629-460-7629) ? [place] Assists [place] cancer patients and their families through emotional , educational and financial support.  905-130-3131 ? [place] DSS Where to apply for food stamps, Medicaid and utility assistance. 445-753-5026 ? RCATS: Transportation to medical appointments. (203)599-7987 ? Social Security Administration: May apply for disability if have a Stage IV cancer. ([phone] 577-7076 412-708-2718 ? [employer], Disability and Transit Services: Assists with nutrition, care and transit needs. [place] Support Programs: @10RELATIVEDAYS @ > Cancer Support Group  2nd Tuesday of the month 1pm-2pm, Journey Room  > Creative Journey  3rd Tuesday of the month 1130am-1pm, Journey Room  > Look Good Feel Better  1st Wednesday of the month 10am-12 noon, Journey Room (Call [place] to register [phone])

## 2017-02-02 NOTE — Progress Notes (Signed)
Omar Lee presents today for injection per MD orders. [name] 500 mcg administered SQ in left lower abdomen. Administration without incident. Patient tolerated well. Patient tolerated treatment without incidence. Patient discharged ambulatory and in stable condition from clinic. Patient to follow up as scheduled.

## 2017-02-09 ENCOUNTER — Encounter (HOSPITAL_COMMUNITY): Payer: Self-pay

## 2017-02-09 ENCOUNTER — Encounter (HOSPITAL_COMMUNITY): Payer: Medicare HMO

## 2017-02-09 ENCOUNTER — Encounter (HOSPITAL_BASED_OUTPATIENT_CLINIC_OR_DEPARTMENT_OTHER): Payer: Medicare HMO

## 2017-02-09 VITALS — BP 117/41 | HR 69 | Temp 98.4°F | Resp 18

## 2017-02-09 DIAGNOSIS — D4621 Refractory anemia with excess of blasts 1: Secondary | ICD-10-CM | POA: Diagnosis not present

## 2017-02-09 DIAGNOSIS — D464 Refractory anemia, unspecified: Secondary | ICD-10-CM

## 2017-02-09 DIAGNOSIS — D709 Neutropenia, unspecified: Secondary | ICD-10-CM

## 2017-02-09 DIAGNOSIS — D649 Anemia, unspecified: Secondary | ICD-10-CM

## 2017-02-09 DIAGNOSIS — D508 Other iron deficiency anemias: Secondary | ICD-10-CM

## 2017-02-09 LAB — CBC WITH DIFFERENTIAL/PLATELET
BASOS ABS: 0 10*3/uL (ref 0.0–0.1)
BASOS PCT: 0 %
Eosinophils Absolute: 0 10*3/uL (ref 0.0–0.7)
Eosinophils Relative: 0 %
HEMATOCRIT: 22.4 % — AB (ref 39.0–52.0)
HEMOGLOBIN: 7.2 g/dL — AB (ref 13.0–17.0)
LYMPHS PCT: 45 %
Lymphs Abs: 0.7 10*3/uL (ref 0.7–4.0)
MCH: 32.6 pg (ref 26.0–34.0)
MCHC: 32.1 g/dL (ref 30.0–36.0)
MCV: 101.4 fL — ABNORMAL HIGH (ref 78.0–100.0)
MONO ABS: 0 10*3/uL — AB (ref 0.1–1.0)
MONOS PCT: 2 %
NEUTROS ABS: 0.9 10*3/uL — AB (ref 1.7–7.7)
NEUTROS PCT: 53 %
Platelets: 137 10*3/uL — ABNORMAL LOW (ref 150–400)
RBC: 2.21 MIL/uL — ABNORMAL LOW (ref 4.22–5.81)
RDW: 22.4 % — ABNORMAL HIGH (ref 11.5–15.5)
WBC: 1.6 10*3/uL — ABNORMAL LOW (ref 4.0–10.5)

## 2017-02-09 LAB — SAMPLE TO BLOOD BANK

## 2017-02-09 LAB — PREPARE RBC (CROSSMATCH)

## 2017-02-09 MED ORDER — DARBEPOETIN ALFA 500 MCG/ML IJ SOSY
PREFILLED_SYRINGE | INTRAMUSCULAR | Status: AC
  Filled 2017-02-09: qty 1

## 2017-02-09 MED ORDER — DIPHENHYDRAMINE HCL 25 MG PO CAPS
ORAL_CAPSULE | ORAL | Status: AC
  Filled 2017-02-09: qty 1

## 2017-02-09 MED ORDER — SODIUM CHLORIDE 0.9% FLUSH
10.0000 mL | INTRAVENOUS | Status: AC | PRN
  Administered 2017-02-09: 10 mL

## 2017-02-09 MED ORDER — DIPHENHYDRAMINE HCL 25 MG PO CAPS
25.0000 mg | ORAL_CAPSULE | Freq: Once | ORAL | Status: AC
  Administered 2017-02-09: 25 mg via ORAL
  Filled 2017-02-09: qty 1

## 2017-02-09 MED ORDER — ACETAMINOPHEN 325 MG PO TABS
ORAL_TABLET | ORAL | Status: AC
  Filled 2017-02-09: qty 2

## 2017-02-09 MED ORDER — DARBEPOETIN ALFA 500 MCG/ML IJ SOSY
500.0000 ug | PREFILLED_SYRINGE | Freq: Once | INTRAMUSCULAR | Status: AC
  Administered 2017-02-09: 500 ug via SUBCUTANEOUS

## 2017-02-09 MED ORDER — ACETAMINOPHEN 325 MG PO TABS
650.0000 mg | ORAL_TABLET | Freq: Once | ORAL | Status: AC
  Administered 2017-02-09: 650 mg via ORAL

## 2017-02-09 MED ORDER — PROCHLORPERAZINE MALEATE 10 MG PO TABS
ORAL_TABLET | ORAL | Status: AC
  Filled 2017-02-09: qty 1

## 2017-02-09 MED ORDER — SODIUM CHLORIDE 0.9 % IV SOLN
250.0000 mL | Freq: Once | INTRAVENOUS | Status: AC
  Administered 2017-02-09: 250 mL via INTRAVENOUS

## 2017-02-09 NOTE — Progress Notes (Signed)
Treatment area for aranesp.  Hgb 7.2 and asking for blood transfusion today.  Stated SOB but no worsening and feels like he needs one unit today and not go through weekend and have worsening symptoms.  Reviewed with [name], NP, and ok to transfuse one unit of blood today and give aranesp shot.    1020-patient informed the nurse he dropped his pre meds on the floor and did not feel he needed the medications and is the reason why he did not notify the nurse.  Reviewed the importance of taking the pre meds for a blood transfusion and the patient agreed to take the medications.  New medications pulled from the pyxis.    Patient tolerated blood transfusion with no complaints voiced.  Denied pain, SOB, chills, and nausea.  Reviewed side effects of post transfusion with the patient and understanding verbalized.  VSS with discharge and left ambulatory with no s/s of distress noted.

## 2017-02-09 NOTE — Patient Instructions (Signed)
[facility] at [facility]  Discharge Instructions:  You received a blood transfusion today.  _______________________________________________________________  Thank you for choosing [facility] at [facility] to provide your oncology and hematology care.  To afford each patient quality time with our providers, please arrive at least 15 minutes before your scheduled appointment.  You need to re-schedule your appointment if you arrive 10 or more minutes late.  We strive to give you quality time with our providers, and arriving late affects you and other patients whose appointments are after yours.  Also, if you no show three or more times for appointments you may be dismissed from the clinic.  Again, thank you for choosing [facility] at [facility]. Our hope is that these requests will allow you access to exceptional care and in a timely manner. _______________________________________________________________  If you have questions after your visit, please contact our office at [phone] between the hours of 8:30 a.m. and 5:00 p.m. Voicemails left after 4:30 p.m. will not be returned until the following business day. _______________________________________________________________  For prescription refill requests, have your pharmacy contact our office. _______________________________________________________________  Recommendations made by the consultant and any test results will be sent to your referring physician. _______________________________________________________________ 

## 2017-02-10 LAB — TYPE AND SCREEN
ABO/RH(D): A POS
Antibody Screen: NEGATIVE
Unit division: 0

## 2017-02-10 LAB — BPAM RBC
Blood Product Expiration Date: 201810292359
ISSUE DATE / TIME: 201810261047
UNIT TYPE AND RH: 6200

## 2017-02-16 ENCOUNTER — Encounter (HOSPITAL_COMMUNITY): Payer: Medicare HMO

## 2017-02-16 ENCOUNTER — Encounter (HOSPITAL_BASED_OUTPATIENT_CLINIC_OR_DEPARTMENT_OTHER): Payer: Medicare HMO

## 2017-02-16 ENCOUNTER — Encounter (HOSPITAL_COMMUNITY): Payer: Self-pay | Admitting: Oncology

## 2017-02-16 ENCOUNTER — Encounter (HOSPITAL_COMMUNITY): Payer: Medicare HMO | Attending: Oncology | Admitting: Oncology

## 2017-02-16 VITALS — BP 129/55 | HR 81 | Resp 16 | Ht 67.0 in | Wt 186.0 lb

## 2017-02-16 DIAGNOSIS — D709 Neutropenia, unspecified: Secondary | ICD-10-CM

## 2017-02-16 DIAGNOSIS — Z8249 Family history of ischemic heart disease and other diseases of the circulatory system: Secondary | ICD-10-CM | POA: Insufficient documentation

## 2017-02-16 DIAGNOSIS — D649 Anemia, unspecified: Secondary | ICD-10-CM | POA: Diagnosis not present

## 2017-02-16 DIAGNOSIS — Z79899 Other long term (current) drug therapy: Secondary | ICD-10-CM | POA: Insufficient documentation

## 2017-02-16 DIAGNOSIS — D4621 Refractory anemia with excess of blasts 1: Secondary | ICD-10-CM

## 2017-02-16 DIAGNOSIS — D464 Refractory anemia, unspecified: Secondary | ICD-10-CM

## 2017-02-16 DIAGNOSIS — Z833 Family history of diabetes mellitus: Secondary | ICD-10-CM | POA: Insufficient documentation

## 2017-02-16 DIAGNOSIS — D469 Myelodysplastic syndrome, unspecified: Secondary | ICD-10-CM | POA: Insufficient documentation

## 2017-02-16 DIAGNOSIS — Z9889 Other specified postprocedural states: Secondary | ICD-10-CM | POA: Diagnosis not present

## 2017-02-16 LAB — CBC WITH DIFFERENTIAL/PLATELET
BASOS ABS: 0 10*3/uL (ref 0.0–0.1)
BASOS PCT: 0 %
EOS ABS: 0 10*3/uL (ref 0.0–0.7)
EOS PCT: 1 %
HCT: 23.8 % — ABNORMAL LOW (ref 39.0–52.0)
Hemoglobin: 7.5 g/dL — ABNORMAL LOW (ref 13.0–17.0)
Lymphocytes Relative: 45 %
Lymphs Abs: 0.8 10*3/uL (ref 0.7–4.0)
MCH: 31.9 pg (ref 26.0–34.0)
MCHC: 31.5 g/dL (ref 30.0–36.0)
MCV: 101.3 fL — ABNORMAL HIGH (ref 78.0–100.0)
Monocytes Absolute: 0 10*3/uL — ABNORMAL LOW (ref 0.1–1.0)
Monocytes Relative: 1 %
Neutro Abs: 0.9 10*3/uL — ABNORMAL LOW (ref 1.7–7.7)
Neutrophils Relative %: 54 %
PLATELETS: 146 10*3/uL — AB (ref 150–400)
RBC: 2.35 MIL/uL — AB (ref 4.22–5.81)
RDW: 22.4 % — ABNORMAL HIGH (ref 11.5–15.5)
WBC: 1.7 10*3/uL — AB (ref 4.0–10.5)

## 2017-02-16 LAB — SAMPLE TO BLOOD BANK

## 2017-02-16 MED ORDER — DARBEPOETIN ALFA 500 MCG/ML IJ SOSY
PREFILLED_SYRINGE | INTRAMUSCULAR | Status: AC
  Filled 2017-02-16: qty 1

## 2017-02-16 MED ORDER — DARBEPOETIN ALFA 500 MCG/ML IJ SOSY
500.0000 ug | PREFILLED_SYRINGE | Freq: Once | INTRAMUSCULAR | Status: AC
  Administered 2017-02-16: 500 ug via SUBCUTANEOUS

## 2017-02-16 NOTE — Patient Instructions (Signed)
[facility] at [facility]  Discharge Instructions:  You received an aranesp shot today.  _______________________________________________________________  Thank you for choosing [facility] at [facility] to provide your oncology and hematology care.  To afford each patient quality time with our providers, please arrive at least 15 minutes before your scheduled appointment.  You need to re-schedule your appointment if you arrive 10 or more minutes late.  We strive to give you quality time with our providers, and arriving late affects you and other patients whose appointments are after yours.  Also, if you no show three or more times for appointments you may be dismissed from the clinic.  Again, thank you for choosing [facility] at [facility]. Our hope is that these requests will allow you access to exceptional care and in a timely manner. _______________________________________________________________  If you have questions after your visit, please contact our office at [phone] between the hours of 8:30 a.m. and 5:00 p.m. Voicemails left after 4:30 p.m. will not be returned until the following business day. _______________________________________________________________  For prescription refill requests, have your pharmacy contact our office. _______________________________________________________________  Recommendations made by the consultant and any test results will be sent to your referring physician. _______________________________________________________________ 

## 2017-02-16 NOTE — Progress Notes (Signed)
[place], [place] [address]   CLINIC:  Medical Oncology/Hematology  PCP:  [name], [place] [address] [phone]   REASON FOR VISIT:  Follow-up for MDS/Refractory and symptomatic anemia  CURRENT THERAPY: [name] 300 mcg weekly     HISTORY OF PRESENT ILLNESS:  (From Dr. Laverle Patter last note on 10/20/16)      INTERVAL HISTORY:  Mr. Langhorst 76 y.o. 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   Patient states that overall he still feels very tired and has little energy to do the things that he normally did easily before. He received 1 unit of pRBC transfusion on 02/09/17. He states he felt somewhat better after his blood transfusion. He states that he has been eating a lot better and appetite is improved. He is meeting with our nutritionist today again. He states he sleeps well at night but does not feel rested in the mornings.    REVIEW OF SYSTEMS:  Review of Systems  Constitutional: Positive for fatigue. Negative for appetite change, chills, fever and unexpected weight change.  HENT:  Negative.  Negative for nosebleeds.   Eyes: Negative.   Respiratory: Positive for shortness of breath (with exertion).   Cardiovascular: Negative.   Gastrointestinal: Negative for blood in stool, constipation, diarrhea, nausea and vomiting.  Endocrine: Negative.   Genitourinary: Negative.  Negative for dysuria and hematuria.   Musculoskeletal: Negative.   Skin: Negative.  Negative for rash.  Neurological: Positive for dizziness (intermittent).     PAST MEDICAL/SURGICAL HISTORY:  Past Medical History:  Diagnosis Date  . Cancer ([place])    MDS  . Hyperlipidemia   . Hypothyroidism   . Reflux   . Sleep apnea    Past Surgical History:  Procedure Laterality Date  . ESOPHAGOGASTRODUODENOSCOPY (EGD) WITH ESOPHAGEAL DILATION N/A 11/22/2012   Procedure: ESOPHAGOGASTRODUODENOSCOPY  (EGD) WITH ESOPHAGEAL DILATION;  Surgeon: Rogene Houston, MD;  Location: [facility];  Service: Endoscopy;  Laterality: N/A;  1120  . HEMORRHOID SURGERY    . NASAL SEPTUM SURGERY    . SHOULDER SURGERY Right   . TONSILLECTOMY AND ADENOIDECTOMY       SOCIAL HISTORY:  Social History   Social History  . Marital status: Divorced    Spouse name: N/A  . Number of children: N/A  . Years of education: N/A   Occupational History  . Not on file.   Social History Main Topics  . Smoking status: Never Smoker  . Smokeless tobacco: Never Used  . Alcohol use No  . Drug use: No  . Sexual activity: Not Currently    Birth control/ protection: None   Other Topics Concern  . Not on file   Social History Narrative  . No narrative on file    FAMILY HISTORY:  Family History  Problem Relation Age of Onset  . Hypertension Mother   . Heart attack Mother   . Heart attack Father   . Diabetes Brother     CURRENT MEDICATIONS:  Outpatient Encounter Prescriptions as of 02/16/2017  Medication Sig  . levothyroxine (SYNTHROID, LEVOTHROID) 112 MCG tablet Take 1 tablet (112 mcg total) by mouth daily.   Facility-Administered Encounter Medications as of 02/16/2017  Medication  . 0.9 %  sodium chloride infusion  . acetaminophen (TYLENOL) tablet 650 mg  . diphenhydrAMINE (BENADRYL) capsule 25 mg  . sodium chloride flush (NS) 0.9 % injection 10 mL  . sodium chloride  flush (NS) 0.9 % injection 10 mL  . sodium chloride flush (NS) 0.9 % injection 10 mL    ALLERGIES:  No Known Allergies   PHYSICAL EXAM:  ECOG Performance status: 2 - Symptomatic; requires periodic assistance.   Vitals:   02/16/17 1004  BP: (!) 129/55  Pulse: 81  Resp: 16  SpO2: 99%   Filed Weights   02/16/17 1004  Weight: 186 lb (84.4 kg)    Physical Exam  Constitutional: He is oriented to person, place, and time.  Chronically-ill appearing male in no acute distress   HENT:  Head: Normocephalic.  Mouth/Throat:  Oropharynx is clear and moist. No oropharyngeal exudate.  Mucous membranes pale   Eyes: Pupils are equal, round, and reactive to light. Conjunctivae are normal. No scleral icterus.  Neck: Normal range of motion. Neck supple.  Cardiovascular: Normal rate and regular rhythm.   Pulmonary/Chest: Effort normal and breath sounds normal. No respiratory distress. He has no wheezes. He has no rales.  Abdominal: Soft. Bowel sounds are normal. There is no tenderness. There is no rebound.  Musculoskeletal: Normal range of motion. He exhibits no edema.  Lymphadenopathy:    He has no cervical adenopathy.       Right: No supraclavicular adenopathy present.       Left: No supraclavicular adenopathy present.  Neurological: He is alert and oriented to person, place, and time. No cranial nerve deficit.  Skin: Skin is warm and dry. No rash noted.  Psychiatric: Mood, memory, affect and judgment normal.  Nursing note and vitals reviewed.    LABORATORY DATA:  I have reviewed the labs as listed.  CBC    Component Value Date/Time   WBC 1.7 (L) 02/16/2017 0919   RBC 2.35 (L) 02/16/2017 0919   HGB 7.5 (L) 02/16/2017 0919   HGB 6.7 (LL) 06/23/2016 0825   HCT 23.8 (L) 02/16/2017 0919   HCT 20.1 (L) 06/23/2016 0825   PLT 146 (L) 02/16/2017 0919   PLT 182 06/23/2016 0825   MCV 101.3 (H) 02/16/2017 0919   MCV 106 (H) 06/23/2016 0825   MCH 31.9 02/16/2017 0919   MCHC 31.5 02/16/2017 0919   RDW 22.4 (H) 02/16/2017 0919   RDW 16.4 (H) 06/23/2016 0825   LYMPHSABS 0.8 02/16/2017 0919   LYMPHSABS 0.9 06/23/2016 0825   MONOABS 0.0 (L) 02/16/2017 0919   EOSABS 0.0 02/16/2017 0919   EOSABS 0.0 06/23/2016 0825   BASOSABS 0.0 02/16/2017 0919   BASOSABS 0.0 06/23/2016 0825   CMP Latest Ref Rng & Units 09/08/2016 08/10/2016 07/26/2016  Glucose 65 - 99 mg/dL 110(H) 97 98  BUN 6 - 20 mg/dL 19 20 18   Creatinine 0.61 - 1.24 mg/dL 1.09 0.98 0.92  Sodium 135 - 145 mmol/L 137 137 134(L)  Potassium 3.5 - 5.1 mmol/L 3.6  4.0 4.4  Chloride 101 - 111 mmol/L 105 106 103  CO2 22 - 32 mmol/L 23 25 24   Calcium 8.9 - 10.3 mg/dL 8.7(L) 8.5(L) 8.7(L)  Total Protein 6.5 - 8.1 g/dL 6.6 6.6 6.7  Total Bilirubin 0.3 - 1.2 mg/dL 1.3(H) 1.0 1.4(H)  Alkaline Phos 38 - 126 U/L 42 44 47  AST 15 - 41 U/L 29 26 24   ALT 17 - 63 U/L 21 21 18     PENDING LABS:    DIAGNOSTIC IMAGING:    PATHOLOGY:  Bone marrow biopsy: 07/28/16       ASSESSMENT & PLAN:   Myelodysplastic syndrome & Symptomatic anemia: -Continue [name] 300 mcg weekly for now.  Will continue to provide blood transfusions when needed as well for symptomatic anemia.  -He has refractory anemia that is not responding much to aranesp. Potentially could start him on vidaza but given his age and however I am concerned that it may make his cytopenias worse. Continue supportive care for now. -He has neutropenia but has not had any recent infections. -Platelets are holding steady in the 140k range.   -Return to cancer center for follow-up in 6 weeks for follow up.   All questions were answered to patient's stated satisfaction. Encouraged patient to call with any new concerns or questions before his next visit to the cancer center and we can certain see him sooner, if needed.    Twana First, MD

## 2017-02-16 NOTE — Progress Notes (Signed)
Patient tolerated aranesp shot with no complaints of pain.  Injection site clean and dry with no bruising or swelling noted at site.  Band aid applied.  VSS with discharge and left ambulatory with no s/s of distress noted.

## 2017-02-16 NOTE — Progress Notes (Signed)
Nutrition Follow-up:  Met with patient during clinic visit this am.  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  Patient on aranesp.    Patient reports that he has been trying to really eat healthy and focus on eating more to improve nutrition and prevent weight loss.  Patient denies nutrition impact symptoms today. Caregiver Otila Kluver not within him in exam room today.    Medications: reviewed  Labs: still pending for today  Anthropometrics:   Patient reports weight today 186 lb.  Last weight on 9/7 of 185 lb 4.8 oz   NUTRITION DIAGNOSIS: Inadequate oral intake   MALNUTRITION DIAGNOSIS: continue to monitor   INTERVENTION:   Patient denied nutrition related questions or concerns today.  Will continue to focus on eating well. Contact information has been provided on previous visits.  Patient knows to contact me with questions or concerns    MONITORING, EVALUATION, GOAL: Patient will tolerate adequate calories and protein to maintain weight   NEXT VISIT: as needed  Hiba Garry B. Zenia Resides, [place], [place] Registered Dietitian [phone] (pager)

## 2017-02-23 ENCOUNTER — Encounter (HOSPITAL_BASED_OUTPATIENT_CLINIC_OR_DEPARTMENT_OTHER): Payer: Medicare HMO

## 2017-02-23 ENCOUNTER — Encounter (HOSPITAL_COMMUNITY): Payer: Self-pay

## 2017-02-23 ENCOUNTER — Encounter (HOSPITAL_COMMUNITY): Payer: Medicare HMO

## 2017-02-23 VITALS — BP 130/47 | HR 79 | Temp 98.6°F | Resp 18

## 2017-02-23 DIAGNOSIS — D4621 Refractory anemia with excess of blasts 1: Secondary | ICD-10-CM

## 2017-02-23 DIAGNOSIS — D464 Refractory anemia, unspecified: Secondary | ICD-10-CM

## 2017-02-23 DIAGNOSIS — D649 Anemia, unspecified: Secondary | ICD-10-CM

## 2017-02-23 DIAGNOSIS — D709 Neutropenia, unspecified: Secondary | ICD-10-CM | POA: Diagnosis not present

## 2017-02-23 DIAGNOSIS — D469 Myelodysplastic syndrome, unspecified: Secondary | ICD-10-CM | POA: Diagnosis not present

## 2017-02-23 DIAGNOSIS — Z833 Family history of diabetes mellitus: Secondary | ICD-10-CM | POA: Diagnosis not present

## 2017-02-23 DIAGNOSIS — Z9889 Other specified postprocedural states: Secondary | ICD-10-CM | POA: Diagnosis not present

## 2017-02-23 DIAGNOSIS — Z79899 Other long term (current) drug therapy: Secondary | ICD-10-CM | POA: Diagnosis not present

## 2017-02-23 DIAGNOSIS — Z8249 Family history of ischemic heart disease and other diseases of the circulatory system: Secondary | ICD-10-CM | POA: Diagnosis not present

## 2017-02-23 LAB — TYPE AND SCREEN
ABO/RH(D): A POS
Antibody Screen: NEGATIVE

## 2017-02-23 LAB — CBC WITH DIFFERENTIAL/PLATELET
Basophils Absolute: 0 10*3/uL (ref 0.0–0.1)
Basophils Relative: 0 %
Eosinophils Absolute: 0 10*3/uL (ref 0.0–0.7)
Eosinophils Relative: 0 %
HCT: 22.6 % — ABNORMAL LOW (ref 39.0–52.0)
HEMOGLOBIN: 7.4 g/dL — AB (ref 13.0–17.0)
LYMPHS ABS: 0.8 10*3/uL (ref 0.7–4.0)
LYMPHS PCT: 51 %
MCH: 33.6 pg (ref 26.0–34.0)
MCHC: 32.7 g/dL (ref 30.0–36.0)
MCV: 102.7 fL — ABNORMAL HIGH (ref 78.0–100.0)
MONO ABS: 0 10*3/uL — AB (ref 0.1–1.0)
MONOS PCT: 1 %
NEUTROS ABS: 0.7 10*3/uL — AB (ref 1.7–7.7)
Neutrophils Relative %: 48 %
Platelets: 136 10*3/uL — ABNORMAL LOW (ref 150–400)
RBC: 2.2 MIL/uL — ABNORMAL LOW (ref 4.22–5.81)
RDW: 22.2 % — ABNORMAL HIGH (ref 11.5–15.5)
WBC: 1.5 10*3/uL — ABNORMAL LOW (ref 4.0–10.5)

## 2017-02-23 LAB — SAMPLE TO BLOOD BANK

## 2017-02-23 MED ORDER — DARBEPOETIN ALFA 500 MCG/ML IJ SOSY
PREFILLED_SYRINGE | INTRAMUSCULAR | Status: AC
  Filled 2017-02-23: qty 1

## 2017-02-23 MED ORDER — DARBEPOETIN ALFA 500 MCG/ML IJ SOSY
500.0000 ug | PREFILLED_SYRINGE | Freq: Once | INTRAMUSCULAR | Status: AC
  Administered 2017-02-23: 500 ug via SUBCUTANEOUS

## 2017-02-23 NOTE — Progress Notes (Signed)
Pt notified of hgb 7.4 and copy of labs given.  Pt refuses blood transfusion today. Instructed to report to the ED over the weekend, or call the clinic on Monday, should he become symptomatic and decide he would like to have a blood transfusion. Pt agrees and verbalizes understanding. Omar Lee presents today for injection per the provider's orders.  [name] administration without incident; see MAR for injection details.  Patient tolerated procedure well and without incident.  No questions or complaints noted at this time. Discharged ambulatory.

## 2017-03-02 ENCOUNTER — Encounter (HOSPITAL_COMMUNITY): Payer: Medicare HMO

## 2017-03-02 ENCOUNTER — Encounter (HOSPITAL_BASED_OUTPATIENT_CLINIC_OR_DEPARTMENT_OTHER): Payer: Medicare HMO

## 2017-03-02 ENCOUNTER — Encounter (HOSPITAL_COMMUNITY): Payer: Self-pay

## 2017-03-02 VITALS — BP 138/53 | HR 71 | Temp 98.6°F | Resp 16

## 2017-03-02 DIAGNOSIS — D649 Anemia, unspecified: Secondary | ICD-10-CM

## 2017-03-02 DIAGNOSIS — Z9889 Other specified postprocedural states: Secondary | ICD-10-CM | POA: Diagnosis not present

## 2017-03-02 DIAGNOSIS — D464 Refractory anemia, unspecified: Secondary | ICD-10-CM | POA: Diagnosis not present

## 2017-03-02 DIAGNOSIS — Z833 Family history of diabetes mellitus: Secondary | ICD-10-CM | POA: Diagnosis not present

## 2017-03-02 DIAGNOSIS — D508 Other iron deficiency anemias: Secondary | ICD-10-CM

## 2017-03-02 DIAGNOSIS — Z79899 Other long term (current) drug therapy: Secondary | ICD-10-CM | POA: Diagnosis not present

## 2017-03-02 DIAGNOSIS — D709 Neutropenia, unspecified: Secondary | ICD-10-CM | POA: Diagnosis not present

## 2017-03-02 DIAGNOSIS — D4621 Refractory anemia with excess of blasts 1: Secondary | ICD-10-CM | POA: Diagnosis not present

## 2017-03-02 DIAGNOSIS — D469 Myelodysplastic syndrome, unspecified: Secondary | ICD-10-CM | POA: Diagnosis not present

## 2017-03-02 DIAGNOSIS — Z8249 Family history of ischemic heart disease and other diseases of the circulatory system: Secondary | ICD-10-CM | POA: Diagnosis not present

## 2017-03-02 LAB — CBC WITH DIFFERENTIAL/PLATELET
Basophils Absolute: 0 10*3/uL (ref 0.0–0.1)
Basophils Relative: 0 %
EOS PCT: 0 %
Eosinophils Absolute: 0 10*3/uL (ref 0.0–0.7)
HEMATOCRIT: 20.6 % — AB (ref 39.0–52.0)
HEMOGLOBIN: 6.5 g/dL — AB (ref 13.0–17.0)
LYMPHS ABS: 0.7 10*3/uL (ref 0.7–4.0)
Lymphocytes Relative: 48 %
MCH: 33.7 pg (ref 26.0–34.0)
MCHC: 31.6 g/dL (ref 30.0–36.0)
MCV: 106.7 fL — ABNORMAL HIGH (ref 78.0–100.0)
MONO ABS: 0 10*3/uL — AB (ref 0.1–1.0)
Monocytes Relative: 2 %
Neutro Abs: 0.7 10*3/uL — ABNORMAL LOW (ref 1.7–7.7)
Neutrophils Relative %: 50 %
Platelets: 119 10*3/uL — ABNORMAL LOW (ref 150–400)
RBC: 1.93 MIL/uL — AB (ref 4.22–5.81)
RDW: 22.7 % — AB (ref 11.5–15.5)
WBC: 1.4 10*3/uL — AB (ref 4.0–10.5)

## 2017-03-02 LAB — PREPARE RBC (CROSSMATCH)

## 2017-03-02 LAB — SAMPLE TO BLOOD BANK

## 2017-03-02 MED ORDER — HEPARIN SOD (PORK) LOCK FLUSH 100 UNIT/ML IV SOLN: 500.0000 [IU] | Freq: Every day | INTRAVENOUS | Status: DC | PRN

## 2017-03-02 MED ORDER — ACETAMINOPHEN 325 MG PO TABS
650.0000 mg | ORAL_TABLET | Freq: Once | ORAL | Status: AC
Start: 2017-03-02 — End: 2017-03-02
  Administered 2017-03-02: 650 mg via ORAL

## 2017-03-02 MED ORDER — SODIUM CHLORIDE 0.9% FLUSH: 3.0000 mL | INTRAVENOUS | Status: DC | PRN

## 2017-03-02 MED ORDER — ACETAMINOPHEN 325 MG PO TABS
ORAL_TABLET | ORAL | Status: AC
  Filled 2017-03-02: qty 2

## 2017-03-02 MED ORDER — HEPARIN SOD (PORK) LOCK FLUSH 100 UNIT/ML IV SOLN: 250.0000 [IU] | INTRAVENOUS | Status: DC | PRN

## 2017-03-02 MED ORDER — DARBEPOETIN ALFA 500 MCG/ML IJ SOSY
PREFILLED_SYRINGE | INTRAMUSCULAR | Status: AC
  Filled 2017-03-02: qty 1

## 2017-03-02 MED ORDER — DIPHENHYDRAMINE HCL 25 MG PO CAPS
25.0000 mg | ORAL_CAPSULE | Freq: Once | ORAL | Status: AC
  Administered 2017-03-02: 25 mg via ORAL

## 2017-03-02 MED ORDER — DIPHENHYDRAMINE HCL 25 MG PO CAPS
ORAL_CAPSULE | ORAL | Status: AC
  Filled 2017-03-02: qty 1

## 2017-03-02 MED ORDER — SODIUM CHLORIDE 0.9 % IV SOLN
250.0000 mL | Freq: Once | INTRAVENOUS | Status: AC
  Administered 2017-03-02: 250 mL via INTRAVENOUS

## 2017-03-02 MED ORDER — DARBEPOETIN ALFA 500 MCG/ML IJ SOSY
500.0000 ug | PREFILLED_SYRINGE | Freq: Once | INTRAMUSCULAR | Status: AC
  Administered 2017-03-02: 500 ug via SUBCUTANEOUS

## 2017-03-02 MED ORDER — SODIUM CHLORIDE 0.9% FLUSH: 10.0000 mL | INTRAVENOUS | Status: DC | PRN

## 2017-03-02 NOTE — Patient Instructions (Signed)
[place] at [facility] Discharge Instructions  RECOMMENDATIONS MADE BY THE CONSULTANT AND ANY TEST RESULTS WILL BE SENT TO YOUR REFERRING PHYSICIAN.  [name] given today. 2 units of blood given today. Follow up as scheduled.  Thank you for choosing [place] at [facility] to provide your oncology and hematology care.  To afford each patient quality time with our provider, please arrive at least 15 minutes before your scheduled appointment time.    If you have a lab appointment with the [facility] please come in thru the  Main Entrance and check in at the main information desk  You need to re-schedule your appointment should you arrive 10 or more minutes late.  We strive to give you quality time with our providers, and arriving late affects you and other patients whose appointments are after yours.  Also, if you no show three or more times for appointments you may be dismissed from the clinic at the providers discretion.     Again, thank you for choosing [facility].  Our hope is that these requests will decrease the amount of time that you wait before being seen by our physicians.       _____________________________________________________________  Should you have questions after your visit to [facility], please contact our office at [phone] between the hours of 8:30 a.m. and 4:30 p.m.  Voicemails left after 4:30 p.m. will not be returned until the following business day.  For prescription refill requests, have your pharmacy contact our office.       Resources For Cancer Patients and their Caregivers ? American Cancer Society: Can assist with transportation, wigs, general needs, runs Look Good Feel Better.        ([phone] ? Cancer Care: Provides financial assistance, online support groups, medication/co-pay assistance.  1-800-813-HOPE 878-055-6139) ? [place] Assists  [place] cancer patients and their families through emotional , educational and financial support.  814-394-8855 ? [place] DSS Where to apply for food stamps, Medicaid and utility assistance. 318-883-2107 ? RCATS: Transportation to medical appointments. (402)448-7674 ? Social Security Administration: May apply for disability if have a Stage IV cancer. (740) 121-2036 (805) 470-8005 ? [employer], Disability and Transit Services: Assists with nutrition, care and transit needs. [place] Support Programs: @10RELATIVEDAYS @ > Cancer Support Group  2nd Tuesday of the month 1pm-2pm, Journey Room  > Creative Journey  3rd Tuesday of the month 1130am-1pm, Journey Room  > Look Good Feel Better  1st Wednesday of the month 10am-12 noon, Journey Room (Call [place] to register [phone])

## 2017-03-02 NOTE — Progress Notes (Signed)
Patient for aranesp shot today.  Hgb 6.5 today and to receive 2 units of blood today.  Orders entered and blood bank called.

## 2017-03-02 NOTE — Progress Notes (Signed)
2 units of blood given today.  Omar Lee presents today for injection per MD orders. [name] 569mcg administered SQ in right Abdomen. Administration without incident. Patient tolerated well.  Treatment given per orders. Patient tolerated it well without problems. Vitals stable and discharged home from clinic ambulatory. Follow up as scheduled.

## 2017-03-03 LAB — TYPE AND SCREEN
ABO/RH(D): A POS
Antibody Screen: NEGATIVE
Unit division: 0
Unit division: 0

## 2017-03-03 LAB — BPAM RBC
Blood Product Expiration Date: 201811302359
Blood Product Expiration Date: 201811302359
ISSUE DATE / TIME: 201811160959
ISSUE DATE / TIME: 201811161156
Unit Type and Rh: 6200
Unit Type and Rh: 6200

## 2017-03-07 ENCOUNTER — Encounter (HOSPITAL_BASED_OUTPATIENT_CLINIC_OR_DEPARTMENT_OTHER): Payer: Medicare HMO

## 2017-03-07 ENCOUNTER — Encounter (HOSPITAL_COMMUNITY): Payer: Medicare HMO

## 2017-03-07 ENCOUNTER — Encounter (HOSPITAL_COMMUNITY): Payer: Self-pay

## 2017-03-07 VITALS — BP 124/60 | HR 67 | Temp 98.3°F | Resp 20

## 2017-03-07 DIAGNOSIS — D649 Anemia, unspecified: Secondary | ICD-10-CM

## 2017-03-07 DIAGNOSIS — Z8249 Family history of ischemic heart disease and other diseases of the circulatory system: Secondary | ICD-10-CM | POA: Diagnosis not present

## 2017-03-07 DIAGNOSIS — Z79899 Other long term (current) drug therapy: Secondary | ICD-10-CM | POA: Diagnosis not present

## 2017-03-07 DIAGNOSIS — D464 Refractory anemia, unspecified: Secondary | ICD-10-CM | POA: Diagnosis not present

## 2017-03-07 DIAGNOSIS — D469 Myelodysplastic syndrome, unspecified: Secondary | ICD-10-CM | POA: Diagnosis not present

## 2017-03-07 DIAGNOSIS — D4621 Refractory anemia with excess of blasts 1: Secondary | ICD-10-CM

## 2017-03-07 DIAGNOSIS — D508 Other iron deficiency anemias: Secondary | ICD-10-CM

## 2017-03-07 DIAGNOSIS — Z833 Family history of diabetes mellitus: Secondary | ICD-10-CM | POA: Diagnosis not present

## 2017-03-07 DIAGNOSIS — Z9889 Other specified postprocedural states: Secondary | ICD-10-CM | POA: Diagnosis not present

## 2017-03-07 DIAGNOSIS — D709 Neutropenia, unspecified: Secondary | ICD-10-CM | POA: Diagnosis not present

## 2017-03-07 LAB — CBC WITH DIFFERENTIAL/PLATELET
BASOS PCT: 0 %
Basophils Absolute: 0 10*3/uL (ref 0.0–0.1)
EOS ABS: 0 10*3/uL (ref 0.0–0.7)
Eosinophils Relative: 0 %
HCT: 28.5 % — ABNORMAL LOW (ref 39.0–52.0)
HEMOGLOBIN: 8.8 g/dL — AB (ref 13.0–17.0)
LYMPHS ABS: 0.7 10*3/uL (ref 0.7–4.0)
Lymphocytes Relative: 50 %
MCH: 31.8 pg (ref 26.0–34.0)
MCHC: 30.9 g/dL (ref 30.0–36.0)
MCV: 102.9 fL — AB (ref 78.0–100.0)
MONO ABS: 0 10*3/uL — AB (ref 0.1–1.0)
MONOS PCT: 2 %
NEUTROS ABS: 0.7 10*3/uL — AB (ref 1.7–7.7)
NEUTROS PCT: 48 %
Platelets: 136 10*3/uL — ABNORMAL LOW (ref 150–400)
RBC: 2.77 MIL/uL — ABNORMAL LOW (ref 4.22–5.81)
RDW: 21.9 % — ABNORMAL HIGH (ref 11.5–15.5)
WBC: 1.5 10*3/uL — ABNORMAL LOW (ref 4.0–10.5)

## 2017-03-07 MED ORDER — DARBEPOETIN ALFA 500 MCG/ML IJ SOSY
500.0000 ug | PREFILLED_SYRINGE | Freq: Once | INTRAMUSCULAR | Status: AC
  Administered 2017-03-07: 500 ug via SUBCUTANEOUS
  Filled 2017-03-07: qty 1

## 2017-03-07 NOTE — Patient Instructions (Signed)
[place] at [facility]  Discharge Instructions:  You received aranesp.   _______________________________________________________________  Thank you for choosing [place] at [facility] to provide your oncology and hematology care.  To afford each patient quality time with our providers, please arrive at least 15 minutes before your scheduled appointment.  You need to re-schedule your appointment if you arrive 10 or more minutes late.  We strive to give you quality time with our providers, and arriving late affects you and other patients whose appointments are after yours.  Also, if you no show three or more times for appointments you may be dismissed from the clinic.  Again, thank you for choosing [facility] hope is that these requests will allow you access to exceptional care and in a timely manner. _______________________________________________________________  If you have questions after your visit, please contact our office at [phone] between the hours of 8:30 a.m. and 5:00 p.m. Voicemails left after 4:30 p.m. will not be returned until the following business day. _______________________________________________________________  For prescription refill requests, have your pharmacy contact our office. _______________________________________________________________  Recommendations made by the consultant and any test results will be sent to your referring physician. _______________________________________________________________

## 2017-03-07 NOTE — Progress Notes (Signed)
Patient tolerated aranesp shot with no complaints voiced.  Injection site clean and dry with no bruising or swelling noted at site.  Band aid applied.  VSS with discharge and left ambulatory with no s/s of distress noted.   

## 2017-03-08 LAB — SAMPLE TO BLOOD BANK

## 2017-03-16 ENCOUNTER — Ambulatory Visit (HOSPITAL_COMMUNITY): Payer: Medicare HMO

## 2017-03-16 ENCOUNTER — Other Ambulatory Visit: Payer: Self-pay

## 2017-03-16 ENCOUNTER — Encounter (HOSPITAL_COMMUNITY): Payer: Medicare HMO

## 2017-03-16 ENCOUNTER — Encounter (HOSPITAL_BASED_OUTPATIENT_CLINIC_OR_DEPARTMENT_OTHER): Payer: Medicare HMO

## 2017-03-16 ENCOUNTER — Encounter (HOSPITAL_COMMUNITY): Payer: Self-pay

## 2017-03-16 VITALS — BP 113/52 | HR 87 | Temp 99.3°F | Resp 18

## 2017-03-16 DIAGNOSIS — D709 Neutropenia, unspecified: Secondary | ICD-10-CM | POA: Diagnosis not present

## 2017-03-16 DIAGNOSIS — Z833 Family history of diabetes mellitus: Secondary | ICD-10-CM | POA: Diagnosis not present

## 2017-03-16 DIAGNOSIS — D508 Other iron deficiency anemias: Secondary | ICD-10-CM

## 2017-03-16 DIAGNOSIS — D649 Anemia, unspecified: Secondary | ICD-10-CM

## 2017-03-16 DIAGNOSIS — Z9889 Other specified postprocedural states: Secondary | ICD-10-CM | POA: Diagnosis not present

## 2017-03-16 DIAGNOSIS — D4621 Refractory anemia with excess of blasts 1: Secondary | ICD-10-CM | POA: Diagnosis not present

## 2017-03-16 DIAGNOSIS — Z8249 Family history of ischemic heart disease and other diseases of the circulatory system: Secondary | ICD-10-CM | POA: Diagnosis not present

## 2017-03-16 DIAGNOSIS — D469 Myelodysplastic syndrome, unspecified: Secondary | ICD-10-CM | POA: Diagnosis not present

## 2017-03-16 DIAGNOSIS — D464 Refractory anemia, unspecified: Secondary | ICD-10-CM | POA: Diagnosis not present

## 2017-03-16 DIAGNOSIS — Z79899 Other long term (current) drug therapy: Secondary | ICD-10-CM | POA: Diagnosis not present

## 2017-03-16 LAB — CBC WITH DIFFERENTIAL/PLATELET
BASOS PCT: 0 %
Basophils Absolute: 0 10*3/uL (ref 0.0–0.1)
EOS ABS: 0 10*3/uL (ref 0.0–0.7)
EOS PCT: 0 %
HEMATOCRIT: 24.8 % — AB (ref 39.0–52.0)
Hemoglobin: 7.6 g/dL — ABNORMAL LOW (ref 13.0–17.0)
LYMPHS ABS: 0.8 10*3/uL (ref 0.7–4.0)
Lymphocytes Relative: 60 %
MCH: 32.1 pg (ref 26.0–34.0)
MCHC: 30.6 g/dL (ref 30.0–36.0)
MCV: 104.6 fL — AB (ref 78.0–100.0)
MONO ABS: 0 10*3/uL — AB (ref 0.1–1.0)
Monocytes Relative: 0 %
NEUTROS PCT: 40 %
Neutro Abs: 0.6 10*3/uL — ABNORMAL LOW (ref 1.7–7.7)
PLATELETS: 137 10*3/uL — AB (ref 150–400)
RBC: 2.37 MIL/uL — AB (ref 4.22–5.81)
RDW: 22.3 % — AB (ref 11.5–15.5)
WBC: 1.4 10*3/uL — AB (ref 4.0–10.5)

## 2017-03-16 LAB — SAMPLE TO BLOOD BANK

## 2017-03-16 MED ORDER — DARBEPOETIN ALFA 500 MCG/ML IJ SOSY
PREFILLED_SYRINGE | INTRAMUSCULAR | Status: AC
  Filled 2017-03-16: qty 1

## 2017-03-16 MED ORDER — DARBEPOETIN ALFA 500 MCG/ML IJ SOSY
500.0000 ug | PREFILLED_SYRINGE | Freq: Once | INTRAMUSCULAR | Status: AC
  Administered 2017-03-16: 500 ug via SUBCUTANEOUS

## 2017-03-16 NOTE — Progress Notes (Signed)
CRITICAL VALUE ALERT Critical value received:  WBC- 1.4 Date of notification:  03/16/17 Time of notification: 0929 Critical value read back:  Yes.   Nurse who received alert:  M.Johathan Province,LPN MD notified (1st page):  L.Zhou,MD

## 2017-03-16 NOTE — Progress Notes (Signed)
Omar Lee presents today for injection per MD orders. [name] 500 mcg administered SQ in left lower abdomen. Administration without incident. Patient tolerated well. Patient tolerated treatment without incidence. Patient discharged ambulatory and in stable condition from clinic. Patient to follow up as scheduled.

## 2017-03-16 NOTE — Patient Instructions (Signed)
[place] at [facility] Discharge Instructions  RECOMMENDATIONS MADE BY THE CONSULTANT AND ANY TEST RESULTS WILL BE SENT TO YOUR REFERRING PHYSICIAN.  Your hgb today was 7.6, you will get your [name] injection  Thank you for choosing [place] at [facility] to provide your oncology and hematology care.  To afford each patient quality time with our provider, please arrive at least 15 minutes before your scheduled appointment time.    If you have a lab appointment with the [facility] please come in thru the  Main Entrance and check in at the main information desk  You need to re-schedule your appointment should you arrive 10 or more minutes late.  We strive to give you quality time with our providers, and arriving late affects you and other patients whose appointments are after yours.  Also, if you no show three or more times for appointments you may be dismissed from the clinic at the providers discretion.     Again, thank you for choosing [facility].  Our hope is that these requests will decrease the amount of time that you wait before being seen by our physicians.       _____________________________________________________________  Should you have questions after your visit to [facility], please contact our office at [phone] between the hours of 8:30 a.m. and 4:30 p.m.  Voicemails left after 4:30 p.m. will not be returned until the following business day.  For prescription refill requests, have your pharmacy contact our office.       Resources For Cancer Patients and their Caregivers ? American Cancer Society: Can assist with transportation, wigs, general needs, runs Look Good Feel Better.        2206947073 ? Cancer Care: Provides financial assistance, online support groups, medication/co-pay assistance.  1-800-813-HOPE 320-365-7861) ? [place] Assists [place] cancer  patients and their families through emotional , educational and financial support.  321 236 5027 ? [place] DSS Where to apply for food stamps, Medicaid and utility assistance. (629)754-9810 ? RCATS: Transportation to medical appointments. 670-769-4147 ? Social Security Administration: May apply for disability if have a Stage IV cancer. 770-168-5428 9048028946 ? [employer], Disability and Transit Services: Assists with nutrition, care and transit needs. [place] Support Programs: @10RELATIVEDAYS @ > Cancer Support Group  2nd Tuesday of the month 1pm-2pm, Journey Room  > Creative Journey  3rd Tuesday of the month 1130am-1pm, Journey Room  > Look Good Feel Better  1st Wednesday of the month 10am-12 noon, Journey Room (Call [facility] to register [phone])

## 2017-03-23 ENCOUNTER — Encounter (HOSPITAL_BASED_OUTPATIENT_CLINIC_OR_DEPARTMENT_OTHER): Payer: Medicare HMO

## 2017-03-23 ENCOUNTER — Encounter (HOSPITAL_COMMUNITY): Payer: Self-pay

## 2017-03-23 ENCOUNTER — Encounter (HOSPITAL_COMMUNITY): Payer: Medicare HMO

## 2017-03-23 ENCOUNTER — Encounter (HOSPITAL_COMMUNITY): Payer: Medicare HMO | Attending: Oncology

## 2017-03-23 ENCOUNTER — Other Ambulatory Visit: Payer: Self-pay

## 2017-03-23 VITALS — BP 146/45 | HR 85 | Temp 97.8°F | Resp 16

## 2017-03-23 DIAGNOSIS — D709 Neutropenia, unspecified: Secondary | ICD-10-CM | POA: Diagnosis present

## 2017-03-23 DIAGNOSIS — D508 Other iron deficiency anemias: Secondary | ICD-10-CM

## 2017-03-23 DIAGNOSIS — D649 Anemia, unspecified: Secondary | ICD-10-CM

## 2017-03-23 DIAGNOSIS — D464 Refractory anemia, unspecified: Secondary | ICD-10-CM

## 2017-03-23 DIAGNOSIS — D4621 Refractory anemia with excess of blasts 1: Secondary | ICD-10-CM | POA: Diagnosis not present

## 2017-03-23 LAB — CBC WITH DIFFERENTIAL/PLATELET
BASOS ABS: 0 10*3/uL (ref 0.0–0.1)
Basophils Relative: 0 %
EOS PCT: 0 %
Eosinophils Absolute: 0 10*3/uL (ref 0.0–0.7)
HEMATOCRIT: 23.5 % — AB (ref 39.0–52.0)
HEMOGLOBIN: 7.4 g/dL — AB (ref 13.0–17.0)
LYMPHS ABS: 0.9 10*3/uL (ref 0.7–4.0)
LYMPHS PCT: 64 %
MCH: 32.6 pg (ref 26.0–34.0)
MCHC: 31.5 g/dL (ref 30.0–36.0)
MCV: 103.5 fL — AB (ref 78.0–100.0)
MONO ABS: 0 10*3/uL — AB (ref 0.1–1.0)
Monocytes Relative: 0 %
NEUTROS ABS: 0.5 10*3/uL — AB (ref 1.7–7.7)
Neutrophils Relative %: 37 %
Platelets: 140 10*3/uL — ABNORMAL LOW (ref 150–400)
RBC: 2.27 MIL/uL — ABNORMAL LOW (ref 4.22–5.81)
RDW: 22.3 % — AB (ref 11.5–15.5)
WBC: 1.5 10*3/uL — ABNORMAL LOW (ref 4.0–10.5)

## 2017-03-23 LAB — SAMPLE TO BLOOD BANK

## 2017-03-23 MED ORDER — DARBEPOETIN ALFA 500 MCG/ML IJ SOSY
500.0000 ug | PREFILLED_SYRINGE | Freq: Once | INTRAMUSCULAR | Status: AC
  Administered 2017-03-23: 500 ug via SUBCUTANEOUS
  Filled 2017-03-23: qty 1

## 2017-03-23 NOTE — Progress Notes (Signed)
Labs reviewed with MD, patient does report some shortness of breath. Patient states he wants to wait till next week to get blood. MD made aware and will give [name] as ordered.       Omar Lee presents today for injection per MD orders. [name] 574mcg administered SQ in left Abdomen. Administration without incident. Patient tolerated well.   Treatment given per orders. Patient tolerated it well without problems. Vitals stable and discharged home from clinic ambulatory. Follow up as scheduled.

## 2017-03-23 NOTE — Patient Instructions (Signed)
[facility] at [facility] Discharge Instructions  RECOMMENDATIONS MADE BY THE CONSULTANT AND ANY TEST RESULTS WILL BE SENT TO YOUR REFERRING PHYSICIAN.  [name] given today  Follow up as scheduled.  Thank you for choosing [facility] at [facility] to provide your oncology and hematology care.  To afford each patient quality time with our provider, please arrive at least 15 minutes before your scheduled appointment time.    If you have a lab appointment with the Cancer Center please come in thru the  Main Entrance and check in at the main information desk  You need to re-schedule your appointment should you arrive 10 or more minutes late.  We strive to give you quality time with our providers, and arriving late affects you and other patients whose appointments are after yours.  Also, if you no show three or more times for appointments you may be dismissed from the clinic at the providers discretion.     Again, thank you for choosing [facility].  Our hope is that these requests will decrease the amount of time that you wait before being seen by our physicians.       _____________________________________________________________  Should you have questions after your visit to [facility], please contact our office at [phone] between the hours of 8:30 a.m. and 4:30 p.m.  Voicemails left after 4:30 p.m. will not be returned until the following business day.  For prescription refill requests, have your pharmacy contact our office.       Resources For Cancer Patients and their Caregivers ? American Cancer Society: Can assist with transportation, wigs, general needs, runs Look Good Feel Better.        1-888-227-6333 ? Cancer Care: Provides financial assistance, online support groups, medication/co-pay assistance.  1-800-813-HOPE (4673) ? [facility] Assists [place] cancer patients and  their families through emotional , educational and financial support.  336-427-4357 ? [place] DSS Where to apply for food stamps, Medicaid and utility assistance. 336-342-1394 ? RCATS: Transportation to medical appointments. 336-347-2287 ? Social Security Administration: May apply for disability if have a Stage IV cancer. 336-342-7796 1-800-772-1213 ? [place] Aging, Disability and Transit Services: Assists with nutrition, care and transit needs. 336-349-2343  Cancer Center Support Programs: @10RELATIVEDAYS@ > Cancer Support Group  2nd Tuesday of the month 1pm-2pm, Journey Room  > Creative Journey  3rd Tuesday of the month 1130am-1pm, Journey Room  > Look Good Feel Better  1st Wednesday of the month 10am-12 noon, Journey Room (Call American Cancer Society to register 1-800-395-5775)   

## 2017-03-30 ENCOUNTER — Ambulatory Visit (HOSPITAL_COMMUNITY): Payer: Medicare HMO

## 2017-03-30 ENCOUNTER — Encounter (HOSPITAL_BASED_OUTPATIENT_CLINIC_OR_DEPARTMENT_OTHER): Payer: Medicare HMO

## 2017-03-30 ENCOUNTER — Encounter (HOSPITAL_COMMUNITY): Payer: Self-pay | Admitting: Oncology

## 2017-03-30 ENCOUNTER — Encounter (HOSPITAL_COMMUNITY): Payer: Medicare HMO

## 2017-03-30 ENCOUNTER — Encounter (HOSPITAL_COMMUNITY): Payer: Self-pay

## 2017-03-30 ENCOUNTER — Encounter (HOSPITAL_BASED_OUTPATIENT_CLINIC_OR_DEPARTMENT_OTHER): Payer: Medicare HMO | Admitting: Oncology

## 2017-03-30 ENCOUNTER — Other Ambulatory Visit (HOSPITAL_COMMUNITY): Payer: Medicare HMO

## 2017-03-30 ENCOUNTER — Other Ambulatory Visit: Payer: Self-pay

## 2017-03-30 VITALS — BP 115/42 | HR 64 | Temp 98.8°F | Resp 16 | Wt 188.4 lb

## 2017-03-30 DIAGNOSIS — D649 Anemia, unspecified: Secondary | ICD-10-CM

## 2017-03-30 DIAGNOSIS — D709 Neutropenia, unspecified: Secondary | ICD-10-CM

## 2017-03-30 DIAGNOSIS — D508 Other iron deficiency anemias: Secondary | ICD-10-CM

## 2017-03-30 DIAGNOSIS — D464 Refractory anemia, unspecified: Secondary | ICD-10-CM

## 2017-03-30 DIAGNOSIS — R0609 Other forms of dyspnea: Secondary | ICD-10-CM | POA: Diagnosis not present

## 2017-03-30 LAB — CBC WITH DIFFERENTIAL/PLATELET
BASOS PCT: 0 %
Basophils Absolute: 0 10*3/uL (ref 0.0–0.1)
EOS ABS: 0 10*3/uL (ref 0.0–0.7)
EOS PCT: 0 %
HEMATOCRIT: 22.5 % — AB (ref 39.0–52.0)
HEMOGLOBIN: 7.1 g/dL — AB (ref 13.0–17.0)
LYMPHS PCT: 53 %
Lymphs Abs: 0.9 10*3/uL (ref 0.7–4.0)
MCH: 33.2 pg (ref 26.0–34.0)
MCHC: 31.6 g/dL (ref 30.0–36.0)
MCV: 105.1 fL — AB (ref 78.0–100.0)
Monocytes Absolute: 0 10*3/uL — ABNORMAL LOW (ref 0.1–1.0)
Monocytes Relative: 1 %
NEUTROS ABS: 0.7 10*3/uL — AB (ref 1.7–7.7)
Neutrophils Relative %: 46 %
PLATELETS: 147 10*3/uL — AB (ref 150–400)
RBC: 2.14 MIL/uL — ABNORMAL LOW (ref 4.22–5.81)
RDW: 22.9 % — ABNORMAL HIGH (ref 11.5–15.5)
WBC: 1.6 10*3/uL — ABNORMAL LOW (ref 4.0–10.5)

## 2017-03-30 LAB — SAMPLE TO BLOOD BANK

## 2017-03-30 LAB — PREPARE RBC (CROSSMATCH)

## 2017-03-30 MED ORDER — ACETAMINOPHEN 325 MG PO TABS
650.0000 mg | ORAL_TABLET | Freq: Once | ORAL | Status: AC
  Administered 2017-03-30: 650 mg via ORAL
  Filled 2017-03-30: qty 2

## 2017-03-30 MED ORDER — SODIUM CHLORIDE 0.9 % IV SOLN
250.0000 mL | Freq: Once | INTRAVENOUS | Status: AC
  Administered 2017-03-30: 250 mL via INTRAVENOUS

## 2017-03-30 MED ORDER — DARBEPOETIN ALFA 500 MCG/ML IJ SOSY
500.0000 ug | PREFILLED_SYRINGE | Freq: Once | INTRAMUSCULAR | Status: AC
  Administered 2017-03-30: 500 ug via SUBCUTANEOUS
  Filled 2017-03-30: qty 1

## 2017-03-30 MED ORDER — DIPHENHYDRAMINE HCL 25 MG PO CAPS
25.0000 mg | ORAL_CAPSULE | Freq: Once | ORAL | Status: AC
  Administered 2017-03-30: 25 mg via ORAL
  Filled 2017-03-30: qty 1

## 2017-03-30 NOTE — Patient Instructions (Signed)
[facility] at [facility] Discharge Instructions  RECOMMENDATIONS MADE BY THE CONSULTANT AND ANY TEST RESULTS WILL BE SENT TO YOUR REFERRING PHYSICIAN.  2 units of blood given today. Follow up as scheduled.  Thank you for choosing [facility] at [facility] to provide your oncology and hematology care.  To afford each patient quality time with our provider, please arrive at least 15 minutes before your scheduled appointment time.    If you have a lab appointment with the Cancer Center please come in thru the  Main Entrance and check in at the main information desk  You need to re-schedule your appointment should you arrive 10 or more minutes late.  We strive to give you quality time with our providers, and arriving late affects you and other patients whose appointments are after yours.  Also, if you no show three or more times for appointments you may be dismissed from the clinic at the providers discretion.     Again, thank you for choosing [facility].  Our hope is that these requests will decrease the amount of time that you wait before being seen by our physicians.       _____________________________________________________________  Should you have questions after your visit to [facility], please contact our office at [phone] between the hours of 8:30 a.m. and 4:30 p.m.  Voicemails left after 4:30 p.m. will not be returned until the following business day.  For prescription refill requests, have your pharmacy contact our office.       Resources For Cancer Patients and their Caregivers ? American Cancer Society: Can assist with transportation, wigs, general needs, runs Look Good Feel Better.        1-888-227-6333 ? Cancer Care: Provides financial assistance, online support groups, medication/co-pay assistance.  1-800-813-HOPE (4673) ? [facility] Assists [place] cancer  patients and their families through emotional , educational and financial support.  336-427-4357 ? [place] DSS Where to apply for food stamps, Medicaid and utility assistance. 336-342-1394 ? RCATS: Transportation to medical appointments. 336-347-2287 ? Social Security Administration: May apply for disability if have a Stage IV cancer. 336-342-7796 1-800-772-1213 ? [place] Aging, Disability and Transit Services: Assists with nutrition, care and transit needs. 336-349-2343  Cancer Center Support Programs: @10RELATIVEDAYS@ > Cancer Support Group  2nd Tuesday of the month 1pm-2pm, Journey Room  > Creative Journey  3rd Tuesday of the month 1130am-1pm, Journey Room  > Look Good Feel Better  1st Wednesday of the month 10am-12 noon, Journey Room (Call American Cancer Society to register 1-800-395-5775)   

## 2017-03-30 NOTE — Progress Notes (Signed)
Per Dr. Talbert Cage, patient to receive 2 units of RBC today. Patient okay to proceed with treatment.

## 2017-03-30 NOTE — Progress Notes (Signed)
2 units of blood given today.    Omar Lee presents today for injection per MD orders. [name] 567mcg administered SQ in right Abdomen. Administration without incident. Patient tolerated well.  Treatment given per orders. Patient tolerated it well without problems. Vitals stable and discharged home from clinic ambulatory. Follow up as scheduled.

## 2017-03-30 NOTE — Progress Notes (Signed)
[place], [place] [address]   CLINIC:  Medical Oncology/Hematology  PCP:  [name], [place] [address] [phone]   REASON FOR VISIT:  Follow-up for MDS/Refractory and symptomatic anemia  CURRENT THERAPY: [name] 300 mcg weekly     HISTORY OF PRESENT ILLNESS:  (From Dr. Laverle Patter last note on 10/20/16)      INTERVAL HISTORY:  Mr. Omar Lee 76 y.o. 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   He states that he has been very tired lately.  He did not have any energy to shovel snow.  He is getting used to the new limitations on his daily activities due to his MDS.  He states that he has been eating well and appetite is good.  He denies any chest pain, vomiting, nausea, vomiting, diarrhea, focal weakness.  He states that he has noted that he gets very short of breath with exertion.  He has been having pain in his shoulders due to previous rotator cuff injury.   REVIEW OF SYSTEMS:  Review of Systems  Constitutional: Positive for fatigue. Negative for appetite change, chills, fever and unexpected weight change.  HENT:  Negative.  Negative for nosebleeds.   Eyes: Negative.   Respiratory: Positive for shortness of breath (with exertion).   Cardiovascular: Negative.   Gastrointestinal: Negative for blood in stool, constipation, diarrhea, nausea and vomiting.  Endocrine: Negative.   Genitourinary: Negative.  Negative for dysuria and hematuria.   Musculoskeletal: Negative.   Skin: Negative.  Negative for rash.  Neurological: Negative for dizziness.     PAST MEDICAL/SURGICAL HISTORY:  Past Medical History:  Diagnosis Date  . Cancer ([place])    MDS  . Hyperlipidemia   . Hypothyroidism   . Reflux   . Sleep apnea    Past Surgical History:  Procedure Laterality Date  . ESOPHAGOGASTRODUODENOSCOPY (EGD) WITH ESOPHAGEAL DILATION N/A 11/22/2012   Procedure:  ESOPHAGOGASTRODUODENOSCOPY (EGD) WITH ESOPHAGEAL DILATION;  Surgeon: Rogene Houston, MD;  Location: [facility] SUITE;  Service: Endoscopy;  Laterality: N/A;  1120  . HEMORRHOID SURGERY    . NASAL SEPTUM SURGERY    . SHOULDER SURGERY Right   . TONSILLECTOMY AND ADENOIDECTOMY       SOCIAL HISTORY:  Social History   Socioeconomic History  . Marital status: Divorced    Spouse name: Not on file  . Number of children: Not on file  . Years of education: Not on file  . Highest education level: Not on file  Social Needs  . Financial resource strain: Not on file  . Food insecurity - worry: Not on file  . Food insecurity - inability: Not on file  . Transportation needs - medical: Not on file  . Transportation needs - non-medical: Not on file  Occupational History  . Not on file  Tobacco Use  . Smoking status: Never Smoker  . Smokeless tobacco: Never Used  Substance and Sexual Activity  . Alcohol use: No  . Drug use: No  . Sexual activity: Not Currently    Birth control/protection: None  Other Topics Concern  . Not on file  Social History Narrative  . Not on file    FAMILY HISTORY:  Family History  Problem Relation Age of Onset  . Hypertension Mother   . Heart attack Mother   . Heart attack Father   . Diabetes Brother     CURRENT MEDICATIONS:  Outpatient Encounter Medications as  of 03/30/2017  Medication Sig  . levothyroxine (SYNTHROID, LEVOTHROID) 112 MCG tablet Take 1 tablet (112 mcg total) by mouth daily.   Facility-Administered Encounter Medications as of 03/30/2017  Medication  . 0.9 %  sodium chloride infusion  . 0.9 %  sodium chloride infusion  . acetaminophen (TYLENOL) tablet 650 mg  . [COMPLETED] acetaminophen (TYLENOL) tablet 650 mg  . [COMPLETED] Darbepoetin Alfa ([name]) injection 500 mcg  . diphenhydrAMINE (BENADRYL) capsule 25 mg  . [COMPLETED] diphenhydrAMINE (BENADRYL) capsule 25 mg  . sodium chloride flush (NS) 0.9 % injection 10 mL  . sodium  chloride flush (NS) 0.9 % injection 10 mL  . sodium chloride flush (NS) 0.9 % injection 10 mL    ALLERGIES:  No Known Allergies   PHYSICAL EXAM:  ECOG Performance status: 2 - Symptomatic; requires periodic assistance.   Blood pressure 105/57, pulse 83, respiratory 16, temperature 98.3, O2 sat 100%, weight 188.4 pounds Physical Exam  Constitutional: He is oriented to person, place, and time.  Chronically-ill appearing male in no acute distress   HENT:  Head: Normocephalic.  Mouth/Throat: Oropharynx is clear and moist. No oropharyngeal exudate.  Mucous membranes pale   Eyes: Conjunctivae are normal. Pupils are equal, round, and reactive to light. No scleral icterus.  Neck: Normal range of motion. Neck supple.  Cardiovascular: Normal rate and regular rhythm.  Pulmonary/Chest: Effort normal and breath sounds normal. No respiratory distress. He has no wheezes. He has no rales.  Abdominal: Soft. Bowel sounds are normal. There is no tenderness. There is no rebound.  Musculoskeletal: Normal range of motion. He exhibits no edema.  Lymphadenopathy:    He has no cervical adenopathy.       Right: No supraclavicular adenopathy present.       Left: No supraclavicular adenopathy present.  Neurological: He is alert and oriented to person, place, and time. No cranial nerve deficit.  Skin: Skin is warm and dry. No rash noted.  Psychiatric: Mood, memory, affect and judgment normal.  Nursing note and vitals reviewed.    LABORATORY DATA:  I have reviewed the labs as listed.  CBC    Component Value Date/Time   WBC 1.6 (L) 03/30/2017 0850   RBC 2.14 (L) 03/30/2017 0850   HGB 7.1 (L) 03/30/2017 0850   HGB 6.7 (LL) 06/23/2016 0825   HCT 22.5 (L) 03/30/2017 0850   HCT 20.1 (L) 06/23/2016 0825   PLT 147 (L) 03/30/2017 0850   PLT 182 06/23/2016 0825   MCV 105.1 (H) 03/30/2017 0850   MCV 106 (H) 06/23/2016 0825   MCH 33.2 03/30/2017 0850   MCHC 31.6 03/30/2017 0850   RDW 22.9 (H) 03/30/2017  0850   RDW 16.4 (H) 06/23/2016 0825   LYMPHSABS 0.9 03/30/2017 0850   LYMPHSABS 0.9 06/23/2016 0825   MONOABS 0.0 (L) 03/30/2017 0850   EOSABS 0.0 03/30/2017 0850   EOSABS 0.0 06/23/2016 0825   BASOSABS 0.0 03/30/2017 0850   BASOSABS 0.0 06/23/2016 0825   CMP Latest Ref Rng & Units 09/08/2016 08/10/2016 07/26/2016  Glucose 65 - 99 mg/dL 110(H) 97 98  BUN 6 - 20 mg/dL _0 Creatinine 0.61 - 1.24 mg/dL 1.09 0.98 0.92  Sodium 135 - 145 mmol/L 137 137 134(L)  Potassium 3.5 - 5.1 mmol/L 3.6 4.0 4.4  Chloride 101 - 111 mmol/L 105 106 103  CO2 22 - 32 mmol/L _1 Calcium 8.9 - 10.3 mg/dL 8.7(L) 8.5(L) 8.7(L)  Total Protein 6.5 - 8.1 g/dL 6.6 6.6  6.7  Total Bilirubin 0.3 - 1.2 mg/dL 1.3(H) 1.0 1.4(H)  Alkaline Phos 38 - 126 U/L 42 44 47  AST 15 - 41 U/L _0 ALT 17 - 63 U/L _1 PENDING LABS:    DIAGNOSTIC IMAGING:    PATHOLOGY:  Bone marrow biopsy: 07/28/16       ASSESSMENT & PLAN:   Myelodysplastic syndrome & Symptomatic anemia: -Continue [name] 500 mcg weekly for now. Will continue to provide blood transfusions when needed as well for symptomatic anemia.  -His hemoglobin is 7.1 g/dL today. Will transfuse him 2 units pRBC today for his symptomatic anemia. -He has neutropenia but has not had any recent infections. ANC 700 today. -Platelets are holding steady in the 140k range.  -Overall prognosis is poor given how frequently he is requiring pRBC transfusions now.   -Return to cancer center for follow-up in 6 weeks for follow up.   All questions were answered to patient's stated satisfaction. Encouraged patient to call with any new concerns or questions before his next visit to the cancer center and we can certain see him sooner, if needed.    Twana First, MD

## 2017-03-30 NOTE — Progress Notes (Signed)
Nutrition Follow-up:  Patient with myelodysplastic syndrome and symptomatic anemia.  Patient on aranesp and requiring frequent blood transfusions.    Met with patient during infusion this am.  Patient reports that he has been eating well, feeling tired and fatigued.  No other nutrition impact symptoms reported today.  Caregiver Otila Kluver not with patient during infusion.    Medications: reviewed  Labs: reviewed  Anthropometrics:   Weight increased to 188 lb today from 186 lb on 11/2   NUTRITION DIAGNOSIS: Inadequate oral intake improved   MALNUTRITION DIAGNOSIS: continue to monitor   INTERVENTION:   Answered patients questions regarding nutrition supplements. Had given boost breeze samples to patient in the past as does not like the "milky" supplements.   Continue to encourage well balanced diet.  Encouraged foods to help with anemia.  Fact sheet provided on past visits.   Patient asking about taking supplements (vit/min/herbal). Discouraged patient from taking supplements until discussed with MD.    MONITORING, EVALUATION, GOAL:  Patient will tolerate adequate calories and protein to maintain weight  NEXT VISIT: as needed  Sharlotte Baka B. Zenia Resides, [place], [place] Registered Dietitian [phone] (pager)

## 2017-03-31 LAB — TYPE AND SCREEN
ABO/RH(D): A POS
ANTIBODY SCREEN: NEGATIVE
UNIT DIVISION: 0
UNIT DIVISION: 0

## 2017-03-31 LAB — BPAM RBC
Blood Product Expiration Date: 201901022359
Blood Product Expiration Date: 201901062359
ISSUE DATE / TIME: 201812141153
ISSUE DATE / TIME: 201812141358
UNIT TYPE AND RH: 6200
Unit Type and Rh: 6200

## 2017-04-12 ENCOUNTER — Other Ambulatory Visit (HOSPITAL_COMMUNITY): Payer: Self-pay | Admitting: *Deleted

## 2017-04-12 DIAGNOSIS — D464 Refractory anemia, unspecified: Secondary | ICD-10-CM

## 2017-04-13 ENCOUNTER — Encounter (HOSPITAL_COMMUNITY): Payer: Medicare HMO

## 2017-04-13 ENCOUNTER — Encounter (HOSPITAL_BASED_OUTPATIENT_CLINIC_OR_DEPARTMENT_OTHER): Payer: Medicare HMO

## 2017-04-13 ENCOUNTER — Encounter (HOSPITAL_COMMUNITY): Payer: Self-pay

## 2017-04-13 ENCOUNTER — Other Ambulatory Visit (HOSPITAL_COMMUNITY): Payer: Medicare HMO

## 2017-04-13 VITALS — BP 134/57 | HR 62 | Temp 98.8°F | Resp 18 | Wt 188.8 lb

## 2017-04-13 DIAGNOSIS — D649 Anemia, unspecified: Secondary | ICD-10-CM | POA: Diagnosis not present

## 2017-04-13 DIAGNOSIS — D709 Neutropenia, unspecified: Secondary | ICD-10-CM | POA: Diagnosis not present

## 2017-04-13 DIAGNOSIS — D464 Refractory anemia, unspecified: Secondary | ICD-10-CM

## 2017-04-13 LAB — CBC WITH DIFFERENTIAL/PLATELET
BASOS ABS: 0 10*3/uL (ref 0.0–0.1)
BASOS PCT: 0 %
EOS ABS: 0 10*3/uL (ref 0.0–0.7)
EOS PCT: 1 %
HEMATOCRIT: 26.4 % — AB (ref 39.0–52.0)
Hemoglobin: 8.5 g/dL — ABNORMAL LOW (ref 13.0–17.0)
Lymphocytes Relative: 59 %
Lymphs Abs: 0.8 10*3/uL (ref 0.7–4.0)
MCH: 32.1 pg (ref 26.0–34.0)
MCHC: 32.2 g/dL (ref 30.0–36.0)
MCV: 99.6 fL (ref 78.0–100.0)
MONO ABS: 0 10*3/uL — AB (ref 0.1–1.0)
MONOS PCT: 1 %
NEUTROS ABS: 0.5 10*3/uL — AB (ref 1.7–7.7)
Neutrophils Relative %: 39 %
PLATELETS: 130 10*3/uL — AB (ref 150–400)
RBC: 2.65 MIL/uL — ABNORMAL LOW (ref 4.22–5.81)
RDW: 20.5 % — AB (ref 11.5–15.5)
WBC: 1.4 10*3/uL — CL (ref 4.0–10.5)

## 2017-04-13 LAB — SAMPLE TO BLOOD BANK

## 2017-04-13 MED ORDER — DARBEPOETIN ALFA 500 MCG/ML IJ SOSY
500.0000 ug | PREFILLED_SYRINGE | Freq: Once | INTRAMUSCULAR | Status: AC
  Administered 2017-04-13: 500 ug via SUBCUTANEOUS
  Filled 2017-04-13: qty 1

## 2017-04-13 NOTE — Progress Notes (Signed)
See injection record.

## 2017-04-13 NOTE — Progress Notes (Signed)
CRITICAL VALUE ALERT Critical value received:  WBC 1.4 Date of notification:  04/13/2017 Time of notification: 0901 Critical value read back:  Yes.   Nurse who received alert:  Joanne Gavel RN MD notified (1st page):  [name] NP

## 2017-04-13 NOTE — Progress Notes (Signed)
Patient tolerated aranesp shot with no complaints voiced.  Injection site clean and dry with no bruising or swelling noted at site.  Band aid applied.  VSS with discharge and left with no s/s of distress noted.

## 2017-04-13 NOTE — Patient Instructions (Signed)
[facility] at [facility]  Discharge Instructions:  You received an aranesp shot today.  _______________________________________________________________  Thank you for choosing [facility] at [facility] to provide your oncology and hematology care.  To afford each patient quality time with our providers, please arrive at least 15 minutes before your scheduled appointment.  You need to re-schedule your appointment if you arrive 10 or more minutes late.  We strive to give you quality time with our providers, and arriving late affects you and other patients whose appointments are after yours.  Also, if you no show three or more times for appointments you may be dismissed from the clinic.  Again, thank you for choosing [facility] at [facility]. Our hope is that these requests will allow you access to exceptional care and in a timely manner. _______________________________________________________________  If you have questions after your visit, please contact our office at [phone] between the hours of 8:30 a.m. and 5:00 p.m. Voicemails left after 4:30 p.m. will not be returned until the following business day. _______________________________________________________________  For prescription refill requests, have your pharmacy contact our office. _______________________________________________________________  Recommendations made by the consultant and any test results will be sent to your referring physician. _______________________________________________________________ 

## 2017-04-19 NOTE — Progress Notes (Deleted)
See injection encounter. 

## 2017-04-20 ENCOUNTER — Inpatient Hospital Stay (HOSPITAL_BASED_OUTPATIENT_CLINIC_OR_DEPARTMENT_OTHER): Payer: Medicare HMO

## 2017-04-20 ENCOUNTER — Inpatient Hospital Stay (HOSPITAL_COMMUNITY): Payer: Medicare HMO | Attending: Oncology

## 2017-04-20 ENCOUNTER — Encounter (HOSPITAL_COMMUNITY): Payer: Self-pay

## 2017-04-20 ENCOUNTER — Encounter (HOSPITAL_COMMUNITY): Payer: Medicare HMO

## 2017-04-20 VITALS — BP 130/60 | HR 79 | Temp 98.7°F | Resp 20 | Wt 187.7 lb

## 2017-04-20 DIAGNOSIS — D649 Anemia, unspecified: Secondary | ICD-10-CM

## 2017-04-20 DIAGNOSIS — D464 Refractory anemia, unspecified: Secondary | ICD-10-CM | POA: Diagnosis not present

## 2017-04-20 DIAGNOSIS — Z5189 Encounter for other specified aftercare: Secondary | ICD-10-CM | POA: Diagnosis not present

## 2017-04-20 LAB — CBC WITH DIFFERENTIAL/PLATELET
BASOS PCT: 0 %
Basophils Absolute: 0 10*3/uL (ref 0.0–0.1)
EOS ABS: 0 10*3/uL (ref 0.0–0.7)
EOS PCT: 1 %
HEMATOCRIT: 26.2 % — AB (ref 39.0–52.0)
Hemoglobin: 8.5 g/dL — ABNORMAL LOW (ref 13.0–17.0)
Lymphocytes Relative: 47 %
Lymphs Abs: 0.9 10*3/uL (ref 0.7–4.0)
MCH: 32.3 pg (ref 26.0–34.0)
MCHC: 32.4 g/dL (ref 30.0–36.0)
MCV: 99.6 fL (ref 78.0–100.0)
MONO ABS: 0 10*3/uL (ref 0.1–1.0)
Monocytes Relative: 0 %
Neutro Abs: 1 10*3/uL (ref 1.7–7.7)
Neutrophils Relative %: 52 %
PLATELETS: 157 10*3/uL (ref 150–400)
RBC: 2.63 MIL/uL — ABNORMAL LOW (ref 4.22–5.81)
RDW: 20.7 % — AB (ref 11.5–15.5)
WBC: 1.9 10*3/uL — ABNORMAL LOW (ref 4.0–10.5)

## 2017-04-20 LAB — SAMPLE TO BLOOD BANK

## 2017-04-20 MED ORDER — DARBEPOETIN ALFA 500 MCG/ML IJ SOSY
500.0000 ug | PREFILLED_SYRINGE | Freq: Once | INTRAMUSCULAR | Status: AC
  Administered 2017-04-20: 500 ug via SUBCUTANEOUS

## 2017-04-20 NOTE — Progress Notes (Signed)
Omar Lee tolerated [name] injection well without complaints or incident. Hgb 8.5 today. VSS Pt discharged self ambulatory in satisfactory condition

## 2017-04-20 NOTE — Patient Instructions (Signed)
[facility] at [facility] Discharge Instructions  RECOMMENDATIONS MADE BY THE CONSULTANT AND ANY TEST RESULTS WILL BE SENT TO YOUR REFERRING PHYSICIAN.  Received [name] injection today. Follow-up as scheduled. Call clinic for any questions or concerns  Thank you for choosing [facility] at [facility] to provide your oncology and hematology care.  To afford each patient quality time with our provider, please arrive at least 15 minutes before your scheduled appointment time.    If you have a lab appointment with the Cancer Center please come in thru the  Main Entrance and check in at the main information desk  You need to re-schedule your appointment should you arrive 10 or more minutes late.  We strive to give you quality time with our providers, and arriving late affects you and other patients whose appointments are after yours.  Also, if you no show three or more times for appointments you may be dismissed from the clinic at the providers discretion.     Again, thank you for choosing [facility].  Our hope is that these requests will decrease the amount of time that you wait before being seen by our physicians.       _____________________________________________________________  Should you have questions after your visit to [facility], please contact our office at [phone] between the hours of 8:30 a.m. and 4:30 p.m.  Voicemails left after 4:30 p.m. will not be returned until the following business day.  For prescription refill requests, have your pharmacy contact our office.       Resources For Cancer Patients and their Caregivers ? American Cancer Society: Can assist with transportation, wigs, general needs, runs Look Good Feel Better.        1-888-227-6333 ? Cancer Care: Provides financial assistance, online support groups, medication/co-pay assistance.  1-800-813-HOPE (4673) ? [facility] Assists [place] cancer patients and their families through emotional , educational and financial support.  336-427-4357 ? [place] DSS Where to apply for food stamps, Medicaid and utility assistance. 336-342-1394 ? RCATS: Transportation to medical appointments. 336-347-2287 ? Social Security Administration: May apply for disability if have a Stage IV cancer. 336-342-7796 1-800-772-1213 ? [place] Aging, Disability and Transit Services: Assists with nutrition, care and transit needs. 336-349-2343  Cancer Center Support Programs: @10RELATIVEDAYS@ > Cancer Support Group  2nd Tuesday of the month 1pm-2pm, Journey Room  > Creative Journey  3rd Tuesday of the month 1130am-1pm, Journey Room  > Look Good Feel Better  1st Wednesday of the month 10am-12 noon, Journey Room (Call [employer] to register 1-800-395-5775)   

## 2017-04-27 ENCOUNTER — Other Ambulatory Visit: Payer: Self-pay

## 2017-04-27 ENCOUNTER — Inpatient Hospital Stay (HOSPITAL_COMMUNITY): Payer: Medicare HMO

## 2017-04-27 ENCOUNTER — Encounter (HOSPITAL_COMMUNITY): Payer: Self-pay

## 2017-04-27 VITALS — BP 138/49 | HR 66 | Temp 98.3°F | Resp 20

## 2017-04-27 DIAGNOSIS — D464 Refractory anemia, unspecified: Secondary | ICD-10-CM

## 2017-04-27 DIAGNOSIS — D649 Anemia, unspecified: Secondary | ICD-10-CM

## 2017-04-27 DIAGNOSIS — Z5189 Encounter for other specified aftercare: Secondary | ICD-10-CM | POA: Diagnosis not present

## 2017-04-27 LAB — CBC WITH DIFFERENTIAL/PLATELET
Basophils Absolute: 0 10*3/uL (ref 0.0–0.1)
Basophils Relative: 0 %
Eosinophils Absolute: 0 10*3/uL (ref 0.0–0.7)
Eosinophils Relative: 0 %
HEMATOCRIT: 25.1 % — AB (ref 39.0–52.0)
Hemoglobin: 8 g/dL — ABNORMAL LOW (ref 13.0–17.0)
Lymphocytes Relative: 63 %
Lymphs Abs: 1 10*3/uL (ref 0.7–4.0)
MCH: 32.4 pg (ref 26.0–34.0)
MCHC: 31.9 g/dL (ref 30.0–36.0)
MCV: 101.6 fL — ABNORMAL HIGH (ref 78.0–100.0)
MONO ABS: 0 10*3/uL (ref 0.1–1.0)
MONOS PCT: 1 %
NEUTROS ABS: 0.6 10*3/uL (ref 1.7–7.7)
Neutrophils Relative %: 36 %
Platelets: 135 10*3/uL — ABNORMAL LOW (ref 150–400)
RBC: 2.47 MIL/uL — ABNORMAL LOW (ref 4.22–5.81)
RDW: 21.7 % — AB (ref 11.5–15.5)
WBC: 1.5 10*3/uL — ABNORMAL LOW (ref 4.0–10.5)

## 2017-04-27 LAB — SAMPLE TO BLOOD BANK

## 2017-04-27 MED ORDER — DARBEPOETIN ALFA 500 MCG/ML IJ SOSY
500.0000 ug | PREFILLED_SYRINGE | Freq: Once | INTRAMUSCULAR | Status: AC
  Administered 2017-04-27: 500 ug via SUBCUTANEOUS
  Filled 2017-04-27: qty 1

## 2017-04-27 NOTE — Progress Notes (Signed)
Omar Lee presents today for injection per the provider's orders.  [name] administration without incident; see MAR for injection details.  Patient tolerated procedure well and without incident.  No questions or complaints noted at this time.  Discharged ambulatory.

## 2017-05-04 ENCOUNTER — Inpatient Hospital Stay (HOSPITAL_COMMUNITY): Payer: Medicare HMO

## 2017-05-04 ENCOUNTER — Encounter (HOSPITAL_COMMUNITY): Payer: Self-pay

## 2017-05-04 VITALS — BP 130/49 | HR 80 | Temp 98.7°F | Resp 20

## 2017-05-04 DIAGNOSIS — Z5189 Encounter for other specified aftercare: Secondary | ICD-10-CM | POA: Diagnosis not present

## 2017-05-04 DIAGNOSIS — D464 Refractory anemia, unspecified: Secondary | ICD-10-CM

## 2017-05-04 DIAGNOSIS — D508 Other iron deficiency anemias: Secondary | ICD-10-CM

## 2017-05-04 DIAGNOSIS — D649 Anemia, unspecified: Secondary | ICD-10-CM

## 2017-05-04 LAB — CBC WITH DIFFERENTIAL/PLATELET
Basophils Absolute: 0 10*3/uL (ref 0.0–0.1)
Basophils Relative: 0 %
Eosinophils Absolute: 0 10*3/uL (ref 0.0–0.7)
Eosinophils Relative: 0 %
HEMATOCRIT: 24.9 % — AB (ref 39.0–52.0)
Hemoglobin: 8 g/dL — ABNORMAL LOW (ref 13.0–17.0)
LYMPHS PCT: 56 %
Lymphs Abs: 0.8 10*3/uL (ref 0.7–4.0)
MCH: 32.7 pg (ref 26.0–34.0)
MCHC: 32.1 g/dL (ref 30.0–36.0)
MCV: 101.6 fL — AB (ref 78.0–100.0)
MONO ABS: 0 10*3/uL (ref 0.1–1.0)
MONOS PCT: 0 %
NEUTROS ABS: 0.6 10*3/uL (ref 1.7–7.7)
Neutrophils Relative %: 44 %
Platelets: 141 10*3/uL — ABNORMAL LOW (ref 150–400)
RBC: 2.45 MIL/uL — ABNORMAL LOW (ref 4.22–5.81)
RDW: 21.9 % — AB (ref 11.5–15.5)
WBC: 1.5 10*3/uL — ABNORMAL LOW (ref 4.0–10.5)

## 2017-05-04 LAB — SAMPLE TO BLOOD BANK

## 2017-05-04 MED ORDER — DARBEPOETIN ALFA 500 MCG/ML IJ SOSY
500.0000 ug | PREFILLED_SYRINGE | Freq: Once | INTRAMUSCULAR | Status: AC
  Administered 2017-05-04: 500 ug via SUBCUTANEOUS
  Filled 2017-05-04: qty 1

## 2017-05-04 NOTE — Progress Notes (Signed)
Patient stated he is feeling ok but feels more fatigued.  Patient stated he has recently noted black stools.  Last colonoscopy was years ago and does not wish to pursue further evaluation.  Instructed the patient to call his PCP or gastroenterologist for evaluation.  Patient stated he did not want to suffer the "indignity" again and will not pursue any further evaluations.    Patient tolerated aranesp shot with no complaints voiced.  Injection site clean and dry with no bruising or swelling noted at site.  Band aid applied.  VSS with discharge and left ambulatory with no s/s of distress noted.

## 2017-05-04 NOTE — Patient Instructions (Signed)
[facility] at [facility]  Discharge Instructions:  You received an aranesp shot today.  _______________________________________________________________  Thank you for choosing [facility] at [facility] to provide your oncology and hematology care.  To afford each patient quality time with our providers, please arrive at least 15 minutes before your scheduled appointment.  You need to re-schedule your appointment if you arrive 10 or more minutes late.  We strive to give you quality time with our providers, and arriving late affects you and other patients whose appointments are after yours.  Also, if you no show three or more times for appointments you may be dismissed from the clinic.  Again, thank you for choosing [facility] at [facility]. Our hope is that these requests will allow you access to exceptional care and in a timely manner. _______________________________________________________________  If you have questions after your visit, please contact our office at [phone] between the hours of 8:30 a.m. and 5:00 p.m. Voicemails left after 4:30 p.m. will not be returned until the following business day. _______________________________________________________________  For prescription refill requests, have your pharmacy contact our office. _______________________________________________________________  Recommendations made by the consultant and any test results will be sent to your referring physician. _______________________________________________________________ 

## 2017-05-11 ENCOUNTER — Inpatient Hospital Stay (HOSPITAL_COMMUNITY): Payer: Medicare HMO

## 2017-05-11 ENCOUNTER — Other Ambulatory Visit: Payer: Self-pay

## 2017-05-11 ENCOUNTER — Encounter (HOSPITAL_COMMUNITY): Payer: Self-pay | Admitting: Internal Medicine

## 2017-05-11 ENCOUNTER — Other Ambulatory Visit (HOSPITAL_COMMUNITY): Payer: Medicare HMO

## 2017-05-11 ENCOUNTER — Inpatient Hospital Stay (HOSPITAL_BASED_OUTPATIENT_CLINIC_OR_DEPARTMENT_OTHER): Payer: Medicare HMO | Admitting: Internal Medicine

## 2017-05-11 VITALS — BP 142/56 | HR 71 | Temp 98.5°F | Resp 18 | Ht 67.0 in | Wt 189.0 lb

## 2017-05-11 DIAGNOSIS — D464 Refractory anemia, unspecified: Secondary | ICD-10-CM | POA: Diagnosis not present

## 2017-05-11 DIAGNOSIS — D649 Anemia, unspecified: Secondary | ICD-10-CM

## 2017-05-11 DIAGNOSIS — Z5189 Encounter for other specified aftercare: Secondary | ICD-10-CM | POA: Diagnosis not present

## 2017-05-11 LAB — CBC WITH DIFFERENTIAL/PLATELET
BASOS PCT: 0 %
Basophils Absolute: 0 10*3/uL (ref 0.0–0.1)
EOS PCT: 0 %
Eosinophils Absolute: 0 10*3/uL (ref 0.0–0.7)
HEMATOCRIT: 23.5 % — AB (ref 39.0–52.0)
HEMOGLOBIN: 7.6 g/dL — AB (ref 13.0–17.0)
LYMPHS PCT: 47 %
Lymphs Abs: 1.1 10*3/uL (ref 0.7–4.0)
MCH: 33 pg (ref 26.0–34.0)
MCHC: 32.3 g/dL (ref 30.0–36.0)
MCV: 102.2 fL — ABNORMAL HIGH (ref 78.0–100.0)
Monocytes Absolute: 0 10*3/uL — ABNORMAL LOW (ref 0.1–1.0)
Monocytes Relative: 1 %
NEUTROS ABS: 1.2 10*3/uL — AB (ref 1.7–7.7)
NEUTROS PCT: 52 %
Platelets: 138 10*3/uL — ABNORMAL LOW (ref 150–400)
RBC: 2.3 MIL/uL — ABNORMAL LOW (ref 4.22–5.81)
RDW: 22.4 % — ABNORMAL HIGH (ref 11.5–15.5)
WBC: 2.3 10*3/uL — ABNORMAL LOW (ref 4.0–10.5)

## 2017-05-11 LAB — SAMPLE TO BLOOD BANK

## 2017-05-11 MED ORDER — FILGRASTIM 300 MCG/0.5ML IJ SOSY
300.0000 ug | PREFILLED_SYRINGE | Freq: Once | INTRAMUSCULAR | Status: AC
  Administered 2017-05-11: 300 ug via SUBCUTANEOUS
  Filled 2017-05-11: qty 0.5

## 2017-05-11 MED ORDER — TBO-FILGRASTIM 300 MCG/0.5ML ~~LOC~~ SOSY
300.0000 ug | PREFILLED_SYRINGE | Freq: Once | SUBCUTANEOUS | Status: DC
  Filled 2017-05-11: qty 0.5

## 2017-05-11 NOTE — Progress Notes (Signed)
Omar Lee presents today for injection per MD orders. Neupogen mcg administered SQ in right Abdomen. Administration without incident. Patient tolerated well.

## 2017-05-11 NOTE — Progress Notes (Signed)
   [place], [place] [address]   CLINIC:  Medical Oncology/Hematology  PCP:  [name], [place] [address] [phone]   REASON FOR VISIT:  Follow-up for MDS/Refractory and symptomatic anemia   HISTORY OF PRESENT ILLNESS:  (From Dr. Laverle Patter last note on 10/20/16)     INTERVAL HISTORY:  Omar Lee 77 y.o. 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   He has been feeling excessively tired and has been eating transfusions more frequently over the last several weeks. He continues to have shortness of breath with minimal activity but no reported bleeding. He does have left shoulder pain from tendinitis which is stable.  No fevers no mouth sores no diarrhea.  He is accompanied by his daughter today. He has pain in the bones involving his spine and pelvis that lasts for a couple days after each time he gets [name] injection he is been getting [name] 500 mcg weekly I was asked by the pharmacist to look at the dosing which appears to be higher than recommended dose for MDS.  PAST MEDICAL/SURGICAL HISTORY:  Past Medical History:  Diagnosis Date  . Cancer ([place])    MDS  . Hyperlipidemia   . Hypothyroidism   . Reflux   . Sleep apnea    Past Surgical History:  Procedure Laterality Date  . ESOPHAGOGASTRODUODENOSCOPY (EGD) WITH ESOPHAGEAL DILATION N/A 11/22/2012   Procedure: ESOPHAGOGASTRODUODENOSCOPY (EGD) WITH ESOPHAGEAL DILATION;  Surgeon: Rogene Houston, MD;  Location: [facility];  Service: Endoscopy;  Laterality: N/A;  1120  . HEMORRHOID SURGERY    . NASAL SEPTUM SURGERY    . SHOULDER SURGERY Right   . TONSILLECTOMY AND ADENOIDECTOMY      PHYSICAL EXAM:  ECOG Performance status: 2 - Symptomatic; requires periodic assistance.   Blood pressure 105/57, pulse 83, respiratory 16, temperature 98.3, O2 sat 100%, weight 188.4 pounds  He appears thin but in no apparent  distress pallor was noted no icterus no petechiae he is alert awake oriented x3.  Participating in his treatment plan actively.  Oral cavity is without any sores or thrush.  No lymph nodes in the neck supraclavicular area.  Abdomen is soft nontender extremities show no edema.   ASSESSMENT & PLAN:  MDS with increasing transfusion need. Does not want to try Vidaza again. Plan is to add Neupogen to his regimen, [name] dose is adjusted ( both to be given weekly) Will continue to monitor cbc weekly  Return in 4 weeks to review response. Continue to discuss re- treatment with Vidaza or Decitabine - perhaps at a lower dose and on a 5 day/week schedule in the future.

## 2017-05-18 ENCOUNTER — Other Ambulatory Visit: Payer: Self-pay

## 2017-05-18 ENCOUNTER — Inpatient Hospital Stay (HOSPITAL_COMMUNITY): Payer: Medicare HMO | Attending: Internal Medicine

## 2017-05-18 ENCOUNTER — Inpatient Hospital Stay (HOSPITAL_COMMUNITY): Payer: Medicare HMO

## 2017-05-18 ENCOUNTER — Encounter (HOSPITAL_COMMUNITY): Payer: Self-pay

## 2017-05-18 VITALS — BP 128/79 | HR 77 | Temp 98.3°F | Resp 18

## 2017-05-18 DIAGNOSIS — Z5189 Encounter for other specified aftercare: Secondary | ICD-10-CM | POA: Diagnosis not present

## 2017-05-18 DIAGNOSIS — D649 Anemia, unspecified: Secondary | ICD-10-CM

## 2017-05-18 DIAGNOSIS — D464 Refractory anemia, unspecified: Secondary | ICD-10-CM

## 2017-05-18 DIAGNOSIS — D4621 Refractory anemia with excess of blasts 1: Secondary | ICD-10-CM | POA: Diagnosis present

## 2017-05-18 LAB — CBC WITH DIFFERENTIAL/PLATELET
Basophils Absolute: 0 10*3/uL (ref 0.0–0.1)
Basophils Relative: 0 %
EOS ABS: 0 10*3/uL (ref 0.0–0.7)
EOS PCT: 0 %
HCT: 23 % — ABNORMAL LOW (ref 39.0–52.0)
HEMOGLOBIN: 7.3 g/dL — AB (ref 13.0–17.0)
Lymphocytes Relative: 54 %
Lymphs Abs: 1 10*3/uL (ref 0.7–4.0)
MCH: 33.2 pg (ref 26.0–34.0)
MCHC: 31.7 g/dL (ref 30.0–36.0)
MCV: 104.5 fL — ABNORMAL HIGH (ref 78.0–100.0)
MONOS PCT: 6 %
Monocytes Absolute: 0.1 10*3/uL (ref 0.1–1.0)
Neutro Abs: 0.7 10*3/uL (ref 1.7–7.7)
Neutrophils Relative %: 40 %
PLATELETS: 115 10*3/uL — AB (ref 150–400)
RBC: 2.2 MIL/uL — ABNORMAL LOW (ref 4.22–5.81)
RDW: 23.2 % — ABNORMAL HIGH (ref 11.5–15.5)
WBC: 1.8 10*3/uL — AB (ref 4.0–10.5)

## 2017-05-18 LAB — SAMPLE TO BLOOD BANK

## 2017-05-18 MED ORDER — EPOETIN ALFA 20000 UNIT/ML IJ SOLN
INTRAMUSCULAR | Status: AC
  Filled 2017-05-18: qty 1

## 2017-05-18 MED ORDER — EPOETIN ALFA 40000 UNIT/ML IJ SOLN
INTRAMUSCULAR | Status: AC
  Filled 2017-05-18: qty 1

## 2017-05-18 MED ORDER — FILGRASTIM 300 MCG/0.5ML IJ SOSY
300.0000 ug | PREFILLED_SYRINGE | Freq: Once | INTRAMUSCULAR | Status: AC
  Administered 2017-05-18: 300 ug via SUBCUTANEOUS
  Filled 2017-05-18: qty 0.5

## 2017-05-18 MED ORDER — TBO-FILGRASTIM 300 MCG/0.5ML ~~LOC~~ SOSY
300.0000 ug | PREFILLED_SYRINGE | Freq: Once | SUBCUTANEOUS | Status: DC
  Filled 2017-05-18: qty 0.5

## 2017-05-18 MED ORDER — EPOETIN ALFA 20000 UNIT/ML IJ SOLN
60000.0000 [IU] | Freq: Once | INTRAMUSCULAR | Status: AC
  Administered 2017-05-18: 60000 [IU] via SUBCUTANEOUS
  Filled 2017-05-18: qty 3

## 2017-05-18 NOTE — Progress Notes (Signed)
Omar Lee presents today for injection per MD orders. Procrit 60,000 units administered SQ in right lower abdomen. Administration without incident. Patient tolerated well.  Omar Lee presents today for injection per MD orders. Neupogen 300 mcg administered SQ in left lower abdomen. Administration without incident. Patient tolerated well.  Patient discharged ambulatory and in stable condition from clinic. Patient to follow up next week as scheduled.

## 2017-05-18 NOTE — Patient Instructions (Signed)
[place] at [facility] Discharge Instructions  RECOMMENDATIONS MADE BY THE CONSULTANT AND ANY TEST RESULTS WILL BE SENT TO YOUR REFERRING PHYSICIAN.  Your hemoglobin today was 7.3.  You got your Procrit 60,000 units and Neupogen 300 mcg injections today. Follow up next week as scheduled.  Thank you for choosing [place] at [facility] to provide your oncology and hematology care.  To afford each patient quality time with our provider, please arrive at least 15 minutes before your scheduled appointment time.    If you have a lab appointment with the [facility] please come in thru the  Main Entrance and check in at the main information desk  You need to re-schedule your appointment should you arrive 10 or more minutes late.  We strive to give you quality time with our providers, and arriving late affects you and other patients whose appointments are after yours.  Also, if you no show three or more times for appointments you may be dismissed from the clinic at the providers discretion.     Again, thank you for choosing [facility].  Our hope is that these requests will decrease the amount of time that you wait before being seen by our physicians.       _____________________________________________________________  Should you have questions after your visit to [facility], please contact our office at [phone] between the hours of 8:30 a.m. and 4:30 p.m.  Voicemails left after 4:30 p.m. will not be returned until the following business day.  For prescription refill requests, have your pharmacy contact our office.       Resources For Cancer Patients and their Caregivers ? American Cancer Society: Can assist with transportation, wigs, general needs, runs Look Good Feel Better.        212-503-5387 ? Cancer Care: Provides financial assistance, online support groups, medication/co-pay assistance.   1-800-813-HOPE 267-567-2852) ? [place] Assists [place] cancer patients and their families through emotional , educational and financial support.  208 226 0452 ? [place] DSS Where to apply for food stamps, Medicaid and utility assistance. 712-695-3041 ? RCATS: Transportation to medical appointments. (808)444-7411 ? Social Security Administration: May apply for disability if have a Stage IV cancer. 405-800-0692 980-831-0822 ? [employer], Disability and Transit Services: Assists with nutrition, care and transit needs. [place] Support Programs: @10RELATIVEDAYS @ > Cancer Support Group  2nd Tuesday of the month 1pm-2pm, Journey Room  > Creative Journey  3rd Tuesday of the month 1130am-1pm, Journey Room  > Look Good Feel Better  1st Wednesday of the month 10am-12 noon, Journey Room (Call [place] to register [phone])

## 2017-05-25 ENCOUNTER — Encounter (HOSPITAL_COMMUNITY): Payer: Self-pay

## 2017-05-25 ENCOUNTER — Inpatient Hospital Stay (HOSPITAL_COMMUNITY): Payer: Medicare HMO

## 2017-05-25 VITALS — BP 144/49 | HR 66 | Temp 98.4°F | Resp 16

## 2017-05-25 DIAGNOSIS — D464 Refractory anemia, unspecified: Secondary | ICD-10-CM

## 2017-05-25 DIAGNOSIS — D4621 Refractory anemia with excess of blasts 1: Secondary | ICD-10-CM | POA: Diagnosis not present

## 2017-05-25 DIAGNOSIS — Z5189 Encounter for other specified aftercare: Secondary | ICD-10-CM | POA: Diagnosis not present

## 2017-05-25 DIAGNOSIS — D649 Anemia, unspecified: Secondary | ICD-10-CM

## 2017-05-25 LAB — CBC WITH DIFFERENTIAL/PLATELET
BASOS ABS: 0 10*3/uL (ref 0.0–0.1)
Basophils Relative: 0 %
EOS ABS: 0 10*3/uL (ref 0.0–0.7)
EOS PCT: 0 %
HCT: 22 % — ABNORMAL LOW (ref 39.0–52.0)
Hemoglobin: 7 g/dL — ABNORMAL LOW (ref 13.0–17.0)
LYMPHS PCT: 56 %
Lymphs Abs: 1.4 10*3/uL (ref 0.7–4.0)
MCH: 32.9 pg (ref 26.0–34.0)
MCHC: 31.8 g/dL (ref 30.0–36.0)
MCV: 103.3 fL — ABNORMAL HIGH (ref 78.0–100.0)
MONO ABS: 0.4 10*3/uL (ref 0.1–1.0)
Monocytes Relative: 16 %
Neutro Abs: 0.7 10*3/uL (ref 1.7–7.7)
Neutrophils Relative %: 28 %
Platelets: 103 10*3/uL — ABNORMAL LOW (ref 150–400)
RBC: 2.13 MIL/uL — ABNORMAL LOW (ref 4.22–5.81)
RDW: 23.3 % — AB (ref 11.5–15.5)
WBC: 2.5 10*3/uL — ABNORMAL LOW (ref 4.0–10.5)

## 2017-05-25 LAB — PREPARE RBC (CROSSMATCH)

## 2017-05-25 LAB — SAMPLE TO BLOOD BANK

## 2017-05-25 MED ORDER — SODIUM CHLORIDE 0.9% FLUSH
10.0000 mL | INTRAVENOUS | Status: AC | PRN
  Administered 2017-05-25: 10 mL

## 2017-05-25 MED ORDER — EPOETIN ALFA 20000 UNIT/ML IJ SOLN
60000.0000 [IU] | Freq: Once | INTRAMUSCULAR | Status: AC
  Administered 2017-05-25: 60000 [IU] via SUBCUTANEOUS
  Filled 2017-05-25: qty 3

## 2017-05-25 MED ORDER — DIPHENHYDRAMINE HCL 25 MG PO CAPS
25.0000 mg | ORAL_CAPSULE | Freq: Once | ORAL | Status: AC
  Administered 2017-05-25: 25 mg via ORAL
  Filled 2017-05-25: qty 1

## 2017-05-25 MED ORDER — ACETAMINOPHEN 325 MG PO TABS
650.0000 mg | ORAL_TABLET | Freq: Once | ORAL | Status: AC
  Administered 2017-05-25: 650 mg via ORAL
  Filled 2017-05-25: qty 2

## 2017-05-25 MED ORDER — SODIUM CHLORIDE 0.9 % IV SOLN
250.0000 mL | Freq: Once | INTRAVENOUS | Status: AC
  Administered 2017-05-25: 250 mL via INTRAVENOUS

## 2017-05-25 NOTE — Patient Instructions (Signed)
[place] at [facility]  Discharge Instructions:  You received a blood transfusion today and procrit shot.  Call for any questions or concerns.  _______________________________________________________________  Thank you for choosing [place] at [facility] to provide your oncology and hematology care.  To afford each patient quality time with our providers, please arrive at least 15 minutes before your scheduled appointment.  You need to re-schedule your appointment if you arrive 10 or more minutes late.  We strive to give you quality time with our providers, and arriving late affects you and other patients whose appointments are after yours.  Also, if you no show three or more times for appointments you may be dismissed from the clinic.  Again, thank you for choosing [place] at [facility] hope is that these requests will allow you access to exceptional care and in a timely manner. _______________________________________________________________  If you have questions after your visit, please contact our office at [phone] between the hours of 8:30 a.m. and 5:00 p.m. Voicemails left after 4:30 p.m. will not be returned until the following business day. _______________________________________________________________  For prescription refill requests, have your pharmacy contact our office. _______________________________________________________________  Recommendations made by the consultant and any test results will be sent to your referring physician. _______________________________________________________________

## 2017-05-25 NOTE — Progress Notes (Signed)
Patient stated he was weak, tired, and fatigue with SOB.  Requesting a blood transfusion today but only for 1 unit.  Reviewed labs and treatment plan with Kirby Crigler, PA, with verbal orders for 1 unit of blood and Procrit but hold the neupogen today.  Reviewed with the patient and verbalized understanding.   Patient tolerated blood transfusion and procrit shot with no complaints voiced.  Peripheral site clean and dry with no bruising or swelling noted at site.  Band aid applied.  Abdomen clean and dry from procrit shots with no bruising or swelling noted at sites.  VSS with discharge and left ambulatory with no s/s of distress noted.

## 2017-05-26 LAB — BPAM RBC
Blood Product Expiration Date: 201902162359
ISSUE DATE / TIME: 201902081030
Unit Type and Rh: 6200

## 2017-05-26 LAB — TYPE AND SCREEN
ABO/RH(D): A POS
Antibody Screen: NEGATIVE
UNIT DIVISION: 0

## 2017-06-01 ENCOUNTER — Inpatient Hospital Stay (HOSPITAL_COMMUNITY): Payer: Medicare HMO

## 2017-06-01 ENCOUNTER — Other Ambulatory Visit: Payer: Self-pay

## 2017-06-01 ENCOUNTER — Encounter (HOSPITAL_COMMUNITY): Payer: Self-pay

## 2017-06-01 ENCOUNTER — Other Ambulatory Visit (HOSPITAL_COMMUNITY): Payer: Self-pay | Admitting: Adult Health

## 2017-06-01 VITALS — BP 119/38 | HR 79 | Temp 98.8°F | Resp 18

## 2017-06-01 DIAGNOSIS — Z5189 Encounter for other specified aftercare: Secondary | ICD-10-CM | POA: Diagnosis not present

## 2017-06-01 DIAGNOSIS — D649 Anemia, unspecified: Secondary | ICD-10-CM

## 2017-06-01 DIAGNOSIS — D464 Refractory anemia, unspecified: Secondary | ICD-10-CM

## 2017-06-01 DIAGNOSIS — D4621 Refractory anemia with excess of blasts 1: Secondary | ICD-10-CM | POA: Diagnosis not present

## 2017-06-01 LAB — CBC WITH DIFFERENTIAL/PLATELET
BASOS ABS: 0 10*3/uL (ref 0.0–0.1)
BASOS PCT: 0 %
Eosinophils Absolute: 0 10*3/uL (ref 0.0–0.7)
Eosinophils Relative: 0 %
HEMATOCRIT: 23.7 % — AB (ref 39.0–52.0)
HEMOGLOBIN: 7.5 g/dL — AB (ref 13.0–17.0)
Lymphocytes Relative: 47 %
Lymphs Abs: 1.3 10*3/uL (ref 0.7–4.0)
MCH: 32.5 pg (ref 26.0–34.0)
MCHC: 31.6 g/dL (ref 30.0–36.0)
MCV: 102.6 fL — ABNORMAL HIGH (ref 78.0–100.0)
MONO ABS: 0.4 10*3/uL (ref 0.1–1.0)
MONOS PCT: 13 %
NEUTROS ABS: 1.1 10*3/uL (ref 1.7–7.7)
NEUTROS PCT: 40 %
Platelets: 94 10*3/uL — ABNORMAL LOW (ref 150–400)
RBC: 2.31 MIL/uL — ABNORMAL LOW (ref 4.22–5.81)
RDW: 23.1 % — AB (ref 11.5–15.5)
WBC: 2.7 10*3/uL — ABNORMAL LOW (ref 4.0–10.5)

## 2017-06-01 LAB — SAMPLE TO BLOOD BANK

## 2017-06-01 MED ORDER — EPOETIN ALFA 20000 UNIT/ML IJ SOLN
60000.0000 [IU] | Freq: Once | INTRAMUSCULAR | Status: AC
  Administered 2017-06-01: 60000 [IU] via SUBCUTANEOUS
  Filled 2017-06-01: qty 3

## 2017-06-01 NOTE — Progress Notes (Signed)
Pt will received Procrit today as ordered.  Will hold Neupogen per T. Kefalas, PA-C.  Pt denies any s/s r/t his anemia and does not wish to receive a blood transfusion at this time.   Omar Lee presents today for injection per the provider's orders.  Procrit administration without incident; see MAR for injection details.  Patient tolerated procedure well and without incident.  No questions or complaints noted at this time.  Discharged ambulatory.

## 2017-06-04 ENCOUNTER — Other Ambulatory Visit: Payer: Self-pay | Admitting: Family Medicine

## 2017-06-08 ENCOUNTER — Other Ambulatory Visit (HOSPITAL_COMMUNITY): Payer: Self-pay

## 2017-06-08 ENCOUNTER — Inpatient Hospital Stay (HOSPITAL_BASED_OUTPATIENT_CLINIC_OR_DEPARTMENT_OTHER): Payer: Medicare HMO | Admitting: Internal Medicine

## 2017-06-08 ENCOUNTER — Encounter (HOSPITAL_COMMUNITY): Payer: Self-pay | Admitting: Internal Medicine

## 2017-06-08 ENCOUNTER — Inpatient Hospital Stay (HOSPITAL_COMMUNITY): Payer: Medicare HMO

## 2017-06-08 ENCOUNTER — Encounter (HOSPITAL_COMMUNITY): Payer: Self-pay

## 2017-06-08 VITALS — BP 112/49 | HR 80 | Temp 98.2°F | Resp 16 | Wt 189.4 lb

## 2017-06-08 VITALS — BP 108/55 | HR 72 | Temp 98.1°F | Resp 18

## 2017-06-08 DIAGNOSIS — D464 Refractory anemia, unspecified: Secondary | ICD-10-CM

## 2017-06-08 DIAGNOSIS — D4621 Refractory anemia with excess of blasts 1: Secondary | ICD-10-CM

## 2017-06-08 DIAGNOSIS — D508 Other iron deficiency anemias: Secondary | ICD-10-CM

## 2017-06-08 DIAGNOSIS — E039 Hypothyroidism, unspecified: Secondary | ICD-10-CM

## 2017-06-08 DIAGNOSIS — Z5189 Encounter for other specified aftercare: Secondary | ICD-10-CM | POA: Diagnosis not present

## 2017-06-08 LAB — CBC WITH DIFFERENTIAL/PLATELET
Basophils Absolute: 0 10*3/uL (ref 0.0–0.1)
Basophils Relative: 0 %
Eosinophils Absolute: 0 10*3/uL (ref 0.0–0.7)
Eosinophils Relative: 0 %
HEMATOCRIT: 22.5 % — AB (ref 39.0–52.0)
HEMOGLOBIN: 7.3 g/dL — AB (ref 13.0–17.0)
LYMPHS ABS: 1.4 10*3/uL (ref 0.7–4.0)
LYMPHS PCT: 48 %
MCH: 33.3 pg (ref 26.0–34.0)
MCHC: 32.4 g/dL (ref 30.0–36.0)
MCV: 102.7 fL — ABNORMAL HIGH (ref 78.0–100.0)
MONOS PCT: 22 %
Monocytes Absolute: 0.6 10*3/uL (ref 0.1–1.0)
NEUTROS ABS: 0.9 10*3/uL (ref 1.7–7.7)
NEUTROS PCT: 30 %
Platelets: 100 10*3/uL — ABNORMAL LOW (ref 150–400)
RBC: 2.19 MIL/uL — AB (ref 4.22–5.81)
RDW: 23.2 % — ABNORMAL HIGH (ref 11.5–15.5)
WBC: 2.9 10*3/uL — ABNORMAL LOW (ref 4.0–10.5)

## 2017-06-08 LAB — SAMPLE TO BLOOD BANK

## 2017-06-08 MED ORDER — ACETAMINOPHEN 325 MG PO TABS
650.0000 mg | ORAL_TABLET | Freq: Once | ORAL | Status: AC
  Administered 2017-06-08: 650 mg via ORAL
  Filled 2017-06-08: qty 2

## 2017-06-08 MED ORDER — DIPHENHYDRAMINE HCL 25 MG PO CAPS
25.0000 mg | ORAL_CAPSULE | Freq: Once | ORAL | Status: AC
  Administered 2017-06-08: 25 mg via ORAL
  Filled 2017-06-08: qty 1

## 2017-06-08 MED ORDER — SODIUM CHLORIDE 0.9% FLUSH
10.0000 mL | INTRAVENOUS | Status: AC | PRN
  Administered 2017-06-08: 10 mL

## 2017-06-08 MED ORDER — SODIUM CHLORIDE 0.9 % IV SOLN
250.0000 mL | Freq: Once | INTRAVENOUS | Status: AC
  Administered 2017-06-08: 250 mL via INTRAVENOUS

## 2017-06-08 NOTE — Patient Instructions (Signed)
[place] at [facility]  Discharge Instructions:  You received 2 units of blood today.  Call for any questions or concerns.   _______________________________________________________________  Thank you for choosing [place] at [facility] to provide your oncology and hematology care.  To afford each patient quality time with our providers, please arrive at least 15 minutes before your scheduled appointment.  You need to re-schedule your appointment if you arrive 10 or more minutes late.  We strive to give you quality time with our providers, and arriving late affects you and other patients whose appointments are after yours.  Also, if you no show three or more times for appointments you may be dismissed from the clinic.  Again, thank you for choosing [facility] at [facility] hope is that these requests will allow you access to exceptional care and in a timely manner. _______________________________________________________________  If you have questions after your visit, please contact our office at [phone] between the hours of 8:30 a.m. and 5:00 p.m. Voicemails left after 4:30 p.m. will not be returned until the following business day. _______________________________________________________________  For prescription refill requests, have your pharmacy contact our office. _______________________________________________________________  Recommendations made by the consultant and any test results will be sent to your referring physician. _______________________________________________________________

## 2017-06-08 NOTE — Progress Notes (Signed)
1350-Epic screen for the second blood transfusion would not allow dual sign off for blood transfusion.  Patient information, MRN, BB armband number, DOB, unit number, blood type, and blood expiration date all matched.  Epic completed the blood transfusion before the second unit could be started.    1350-Second blood transfusion double checked by Charlyne Petrin, Rn, and Forest Gleason, Rn.  All information verified as mentioned above along with patient verbalizing name and date of birth.  Unit started at 1350 with nurse at bedside.   1407- Vital signs documented under blood administration in flowsheets.  No complaints voiced by the patient.  Resting with no s/s of distress noted.   1530-blood transfusion completed with no complaints voiced.  See blood administration flow sheet for vital signs.   Patient tolerated blood transfusion with no complaints voiced.  Peripheral IV site clean and dry with no bruising or swelling noted at site.  No complaints of pain at site.  Band aid applied.  VSS with discharge and left ambulatory with no s/s of distress noted.

## 2017-06-08 NOTE — Patient Instructions (Addendum)
[place] at [facility] Discharge Instructions  RECOMMENDATIONS MADE BY THE CONSULTANT AND ANY TEST RESULTS WILL BE SENT TO YOUR REFERRING PHYSICIAN.  You saw Dr. Mathis Dad Higgs today We will give you 2 unit of blood today Follow up next week for labs only. We are not going to give you the Procrit moving forward, we will only give you blood if your counts are low. Follow up with MD in 2 months  Thank you for choosing [place] at [facility] to provide your oncology and hematology care.  To afford each patient quality time with our provider, please arrive at least 15 minutes before your scheduled appointment time.    If you have a lab appointment with the [facility] please come in thru the  Main Entrance and check in at the main information desk  You need to re-schedule your appointment should you arrive 10 or more minutes late.  We strive to give you quality time with our providers, and arriving late affects you and other patients whose appointments are after yours.  Also, if you no show three or more times for appointments you may be dismissed from the clinic at the providers discretion.     Again, thank you for choosing [facility].  Our hope is that these requests will decrease the amount of time that you wait before being seen by our physicians.       _____________________________________________________________  Should you have questions after your visit to [facility], please contact our office at [phone] between the hours of 8:30 a.m. and 4:30 p.m.  Voicemails left after 4:30 p.m. will not be returned until the following business day.  For prescription refill requests, have your pharmacy contact our office.       Resources For Cancer Patients and their Caregivers ? American Cancer Society: Can assist with transportation, wigs, general needs, runs Look Good Feel Better.         564-846-1899 ? Cancer Care: Provides financial assistance, online support groups, medication/co-pay assistance.  1-800-813-HOPE 9283524418) ? [place] Assists [place] cancer patients and their families through emotional , educational and financial support.  (941)525-6167 ? [place] DSS Where to apply for food stamps, Medicaid and utility assistance. 808 351 8730 ? RCATS: Transportation to medical appointments. (364)070-8997 ? Social Security Administration: May apply for disability if have a Stage IV cancer. 925-304-1248 205 823 5982 ? [employer], Disability and Transit Services: Assists with nutrition, care and transit needs. [place] Support Programs: @10RELATIVEDAYS @ > Cancer Support Group  2nd Tuesday of the month 1pm-2pm, Journey Room  > Creative Journey  3rd Tuesday of the month 1130am-1pm, Journey Room  > Look Good Feel Better  1st Wednesday of the month 10am-12 noon, Journey Room (Call [place] to register [phone])

## 2017-06-09 LAB — PREPARE RBC (CROSSMATCH)

## 2017-06-10 LAB — TYPE AND SCREEN
ABO/RH(D): A POS
Antibody Screen: NEGATIVE
UNIT DIVISION: 0
Unit division: 0

## 2017-06-10 LAB — BPAM RBC
BLOOD PRODUCT EXPIRATION DATE: 201903052359
Blood Product Expiration Date: 201903132359
ISSUE DATE / TIME: 201902221145
ISSUE DATE / TIME: 201902221331
UNIT TYPE AND RH: 6200
Unit Type and Rh: 6200

## 2017-06-11 ENCOUNTER — Telehealth: Payer: Self-pay | Admitting: Family Medicine

## 2017-06-11 NOTE — Telephone Encounter (Signed)
Error

## 2017-06-12 ENCOUNTER — Ambulatory Visit (INDEPENDENT_AMBULATORY_CARE_PROVIDER_SITE_OTHER): Payer: Medicare HMO | Admitting: Family Medicine

## 2017-06-12 ENCOUNTER — Encounter: Payer: Self-pay | Admitting: Family Medicine

## 2017-06-12 VITALS — BP 122/86 | Ht 67.0 in | Wt 188.6 lb

## 2017-06-12 DIAGNOSIS — D708 Other neutropenia: Secondary | ICD-10-CM

## 2017-06-12 DIAGNOSIS — E039 Hypothyroidism, unspecified: Secondary | ICD-10-CM | POA: Diagnosis not present

## 2017-06-12 DIAGNOSIS — R5383 Other fatigue: Secondary | ICD-10-CM | POA: Diagnosis not present

## 2017-06-12 DIAGNOSIS — R413 Other amnesia: Secondary | ICD-10-CM

## 2017-06-12 MED ORDER — LEVOTHYROXINE SODIUM 112 MCG PO TABS: 112.0000 ug | ORAL_TABLET | Freq: Every day | ORAL | 3 refills | Status: DC

## 2017-06-12 NOTE — Progress Notes (Signed)
   pt in for thyr assessment  Take sthyr med faithfully.  Generally does not miss a dose.  Has not had blood work for some time  Pt receving blood transfusion, multiple, for chronc myelodyspasia, cusing chronic fatigue.  Continues to see hematologist for this.  Hematologist notes reviewed with patient.  Concerned about short term memeory   Having more challenges with anxiety and sress, in part because of health issues.  More more difficulties with short-term memory.  Has not had anything serious such as forgetting where he has aware to drive her leaving stove on her door open.  But definitely some concern.  No close family history of dementia         Review of Systems No headache, no major weight loss or weight gain, no chest pain no back pain abdominal pain no change in bowel habits complete ROS otherwise negative     Objective:   Physical Exam  Alert and oriented, vitals reviewed and stable, NAD ENT-TM's and ext canals WNL bilat via otoscopic exam Soft palate, tonsils and post pharynx WNL via oropharyngeal exam Neck-symmetric, no masses; thyroid nonpalpable and nontender Pulmonary-no tachypnea or accessory muscle use; Clear without wheezes via auscultation Card--no abnrml murmurs, rhythm reg and rate WNL Carotid pulses symmetric, without bruits See MMSE result      Assessment & Plan:  Impression short-term memory discussed at length.  No evidence of dementia at this point.  Nature short-term memory challenges discussed  2.  Hypothyroidism status uncertain we will check  3.  Myelodysplasia with anemia and recurrent transfusions discussed  Greater than 50% of this 25 minute face to face visit was spent in counseling and discussion and coordination of care regarding the above diagnosis/diagnosies

## 2017-06-13 LAB — TSH: TSH: 1.91 u[IU]/mL (ref 0.450–4.500)

## 2017-06-15 ENCOUNTER — Other Ambulatory Visit (HOSPITAL_COMMUNITY): Payer: Self-pay

## 2017-06-15 DIAGNOSIS — D464 Refractory anemia, unspecified: Secondary | ICD-10-CM

## 2017-06-17 DIAGNOSIS — R413 Other amnesia: Secondary | ICD-10-CM | POA: Insufficient documentation

## 2017-06-18 ENCOUNTER — Inpatient Hospital Stay (HOSPITAL_COMMUNITY): Payer: Medicare HMO | Attending: Internal Medicine

## 2017-06-18 DIAGNOSIS — D4621 Refractory anemia with excess of blasts 1: Secondary | ICD-10-CM | POA: Diagnosis present

## 2017-06-18 DIAGNOSIS — D464 Refractory anemia, unspecified: Secondary | ICD-10-CM

## 2017-06-18 LAB — CBC WITH DIFFERENTIAL/PLATELET
BASOS ABS: 0.1 10*3/uL (ref 0.0–0.1)
Basophils Relative: 1 %
EOS PCT: 0 %
Eosinophils Absolute: 0 10*3/uL (ref 0.0–0.7)
HEMATOCRIT: 27.1 % — AB (ref 39.0–52.0)
Hemoglobin: 8.6 g/dL — ABNORMAL LOW (ref 13.0–17.0)
Lymphocytes Relative: 35 %
Lymphs Abs: 1.9 10*3/uL (ref 0.7–4.0)
MCH: 32.3 pg (ref 26.0–34.0)
MCHC: 31.7 g/dL (ref 30.0–36.0)
MCV: 101.9 fL — ABNORMAL HIGH (ref 78.0–100.0)
Monocytes Absolute: 1.8 10*3/uL — ABNORMAL HIGH (ref 0.1–1.0)
Monocytes Relative: 33 %
NEUTROS PCT: 31 %
Neutro Abs: 1.7 10*3/uL (ref 1.7–7.7)
PLATELETS: 69 10*3/uL — AB (ref 150–400)
RBC: 2.66 MIL/uL — AB (ref 4.22–5.81)
RDW: 19.5 % — ABNORMAL HIGH (ref 11.5–15.5)
WBC: 5.5 10*3/uL (ref 4.0–10.5)

## 2017-06-18 LAB — SAMPLE TO BLOOD BANK

## 2017-06-18 NOTE — Progress Notes (Signed)
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  Assessment and Plan:  1.   1.  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  He had 8% blasts on bone marrow biopsy done 07/2016.  Pt has increasing transfusion needs despite [name].   He does not want to try Vidaza again.  I have discussed with him labs today show WBC 2.9 HB 7.3 and plts 100,000.  He will need to be transfused with PRBCs.  I have discussed with him may need to consider repeat bone marrow biopsy Especially if counts continue to trend downwards.  He will have labs done monthly.  Will hold [name] for now due to ongoing transfusion requirements despite therapy.    2.  Hypothyroidism.  Pt is on Synthroid.   Follow-up with PCP for ongoing monitoring.    INTERVAL HISTORY: (From Dr. Laverle Patter last note on 10/20/16)    Current Status:  77 y.o. male seen for follow-up for MDS with refractory/symptomatic anemia.  He continues to require blood transfusions.  Problem List Patient Active Problem List   Diagnosis Date Noted  . Short-term memory loss [R41.3] 06/17/2017  . 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 Anemia [D64.9] 07/21/2016  . GERD (gastroesophageal reflux disease) [K21.9] 01/20/2013  . Barrett's esophagus with esophagitis [K22.70, K20.9] 01/20/2013  . Hypothyroidism [E03.9] 09/27/2012    Past Medical History Past Medical History:  Diagnosis Date  . Cancer ([place])    MDS  . Hyperlipidemia   . Hypothyroidism   . Reflux   . Sleep apnea     Past Surgical History Past Surgical History:  Procedure Laterality Date  . ESOPHAGOGASTRODUODENOSCOPY (EGD) WITH ESOPHAGEAL DILATION N/A 11/22/2012   Procedure: ESOPHAGOGASTRODUODENOSCOPY (EGD) WITH ESOPHAGEAL DILATION;  Surgeon: Rogene Houston, MD;   Location: [facility];  Service: Endoscopy;  Laterality: N/A;  1120  . HEMORRHOID SURGERY    . NASAL SEPTUM SURGERY    . SHOULDER SURGERY Right   . TONSILLECTOMY AND ADENOIDECTOMY      Family History Family History  Problem Relation Age of Onset  . Hypertension Mother   . Heart attack Mother   . Heart attack Father   . Diabetes Brother      Social History  reports that  has never smoked. he has never used smokeless tobacco. He reports that he does not drink alcohol or use drugs.  Medications  Current Outpatient Medications:  .  acetaminophen (TYLENOL) 325 MG tablet, Take 650 mg by mouth as needed., Disp: , Rfl:  .  levothyroxine (SYNTHROID, LEVOTHROID) 112 MCG tablet, Take 1 tablet (112 mcg total) by mouth daily., Disp: 90 tablet, Rfl: 3 No current facility-administered medications for this visit.   Facility-Administered Medications Ordered in Other Visits:  .  0.9 %  sodium chloride infusion, 250 mL, Intravenous, Once, Twana First, MD .  acetaminophen (TYLENOL) tablet 650 mg, 650 mg, Oral, Once, Twana First, MD .  diphenhydrAMINE (BENADRYL) capsule 25 mg, 25 mg, Oral, Once, Twana First, MD .  sodium chloride flush (NS) 0.9 % injection 10 mL, 10 mL, Intracatheter, PRN, Holley Bouche, NP .  sodium chloride flush (NS) 0.9 % injection 10 mL, 10 mL, Intracatheter, PRN, Twana First, MD .  sodium chloride flush (NS) 0.9 % injection 10 mL, 10 mL, Intracatheter, PRN, Holley Bouche, NP  Allergies Patient has no  known allergies.  Review of Systems Review of Systems - Oncology ROS as per HPI otherwise 12 point ROS is negative.   Physical Exam  Vitals Wt Readings from Last 3 Encounters:  06/12/17 188 lb 9.6 oz (85.5 kg)  06/08/17 189 lb 6.4 oz (85.9 kg)  05/11/17 189 lb (85.7 kg)   Temp Readings from Last 3 Encounters:  06/08/17 98.1 F (36.7 C) (Oral)  06/08/17 98.2 F (36.8 C) (Oral)  06/01/17 98.8 F (37.1 C) (Oral)   BP Readings from Last 3  Encounters:  06/12/17 122/86  06/08/17 (!) 108/55  06/08/17 (!) 112/49   Pulse Readings from Last 3 Encounters:  06/08/17 72  06/08/17 80  06/01/17 79    Constitutional: Well-developed, well-nourished, and in no distress.   HENT: Head: Normocephalic and atraumatic.  Mouth/Throat: No oropharyngeal exudate. Mucosa moist. Eyes: Pupils are equal, round, and reactive to light. Conjunctivae are normal. No scleral icterus.  Neck: Normal range of motion. Neck supple. No JVD present.  Cardiovascular: Normal rate, regular rhythm and normal heart sounds.  Exam reveals no gallop and no friction rub.   No murmur heard. Pulmonary/Chest: Effort normal and breath sounds normal. No respiratory distress. No wheezes.No rales.  Abdominal: Soft. Bowel sounds are normal. No distension. There is no tenderness. There is no guarding.  Musculoskeletal: No edema or tenderness.  Lymphadenopathy: No cervical, axillary or supraclavicular adenopathy.  Neurological: Alert and oriented to person, place, and time. No cranial nerve deficit.  Skin: Skin is warm and dry. No rash noted. No erythema. No pallor.  Psychiatric: Affect and judgment normal.   Labs Infusion on 06/08/2017  Component Date Value Ref Range Status  . Order Confirmation 06/08/2017    Final                   Value:ORDER PROCESSED BY BLOOD BANK Performed at [facility], [address], [place], [place] [address]   . ABO/RH(D) 06/08/2017 A POS   Final  . Antibody Screen 06/08/2017 NEG   Final  . Sample Expiration 06/08/2017 06/11/2017   Final  . Unit Number 06/08/2017 [id number]   Final  . Blood Component Type 06/08/2017 RED CELLS,LR   Final  . Unit division 06/08/2017 00   Final  . Status of Unit 06/08/2017 ISSUED,FINAL   Final  . Transfusion Status 06/08/2017 OK TO TRANSFUSE   Final  . Crossmatch Result 06/08/2017    Final                   Value:Compatible Performed at [facility], [address] [place], [place] [address]   . Unit  Number 06/08/2017 [id number]   Final  . Blood Component Type 06/08/2017 RED CELLS,LR   Final  . Unit division 06/08/2017 00   Final  . Status of Unit 06/08/2017 ISSUED,FINAL   Final  . Transfusion Status 06/08/2017 OK TO TRANSFUSE   Final  . Crossmatch Result 06/08/2017 Compatible   Final  . ISSUE DATE / TIME 06/08/2017 [id number]   Final  . Blood Product Unit Number 06/08/2017 [id number]   Final  . PRODUCT CODE 06/08/2017 [id number]   Final  . Unit Type and Rh 06/08/2017 6200   Final  . Blood Product Expiration Date 06/08/2017 [id number]   Final  . ISSUE DATE / TIME 06/08/2017 [id number]   Final  . Blood Product Unit Number 06/08/2017 [id number]   Final  . PRODUCT CODE 06/08/2017 [id number]   Final  . Unit Type and Rh 06/08/2017 6200  Final  . Blood Product Expiration Date 06/08/2017 [id number]   Final  Appointment on 06/08/2017  Component Date Value Ref Range Status  . WBC 06/08/2017 2.9* 4.0 - 10.5 K/uL Final   Comment: WHITE COUNT CONFIRMED ON SMEAR ADJUSTED FOR NUCLEATED RBC'S   . RBC 06/08/2017 2.19* 4.22 - 5.81 MIL/uL Final  . Hemoglobin 06/08/2017 7.3* 13.0 - 17.0 g/dL Final  . HCT 06/08/2017 22.5* 39.0 - 52.0 % Final  . MCV 06/08/2017 102.7* 78.0 - 100.0 fL Final  . MCH 06/08/2017 33.3  26.0 - 34.0 pg Final  . MCHC 06/08/2017 32.4  30.0 - 36.0 g/dL Final  . RDW 06/08/2017 23.2* 11.5 - 15.5 % Final  . Platelets 06/08/2017 100* 150 - 400 K/uL Final   Comment: PLATELET COUNT CONFIRMED BY SMEAR SPECIMEN CHECKED FOR CLOTS   . Neutrophils Relative % 06/08/2017 30  % Final  . Neutro Abs 06/08/2017 0.9  1.7 - 7.7 K/uL Final  . Lymphocytes Relative 06/08/2017 48  % Final  . Lymphs Abs 06/08/2017 1.4  0.7 - 4.0 K/uL Final  . Monocytes Relative 06/08/2017 22  % Final  . Monocytes Absolute 06/08/2017 0.6  0.1 - 1.0 K/uL Final  . Eosinophils Relative 06/08/2017 0  % Final  . Eosinophils Absolute 06/08/2017 0.0  0.0 - 0.7 K/uL Final  . Basophils Relative 06/08/2017 0   % Final  . Basophils Absolute 06/08/2017 0.0  0.0 - 0.1 K/uL Final   Performed at [facility], [address], [place], [place] [address]  . Blood Bank Specimen 06/08/2017 SAMPLE AVAILABLE FOR TESTING   Final  . Sample Expiration 06/08/2017    Final                   Value:06/11/2017 Performed at [facility], [address] [place], [place] [address]      Pathology Orders Placed This Encounter  Procedures  . CBC with Differential/Platelet    Standing Status:   Future    Standing Expiration Date:   06/08/2018  . Comprehensive metabolic panel    Standing Status:   Future    Standing Expiration Date:   06/08/2018  . Lactate dehydrogenase    Standing Status:   Future    Standing Expiration Date:   06/08/2018       Zoila Shutter MD

## 2017-06-21 ENCOUNTER — Other Ambulatory Visit (HOSPITAL_COMMUNITY): Payer: Self-pay

## 2017-06-21 DIAGNOSIS — D464 Refractory anemia, unspecified: Secondary | ICD-10-CM

## 2017-06-25 ENCOUNTER — Inpatient Hospital Stay (HOSPITAL_COMMUNITY): Payer: Medicare HMO

## 2017-06-25 DIAGNOSIS — D464 Refractory anemia, unspecified: Secondary | ICD-10-CM

## 2017-06-25 DIAGNOSIS — D4621 Refractory anemia with excess of blasts 1: Secondary | ICD-10-CM | POA: Diagnosis not present

## 2017-06-25 LAB — CBC WITH DIFFERENTIAL/PLATELET
BASOS ABS: 0 10*3/uL (ref 0.0–0.1)
Band Neutrophils: 0 %
Basophils Relative: 0 %
Blasts: 0 %
Eosinophils Absolute: 0 10*3/uL (ref 0.0–0.7)
Eosinophils Relative: 0 %
HEMATOCRIT: 24.8 % — AB (ref 39.0–52.0)
Hemoglobin: 8 g/dL — ABNORMAL LOW (ref 13.0–17.0)
Lymphocytes Relative: 73 %
Lymphs Abs: 6.8 10*3/uL — ABNORMAL HIGH (ref 0.7–4.0)
MCH: 32.3 pg (ref 26.0–34.0)
MCHC: 32.3 g/dL (ref 30.0–36.0)
MCV: 100 fL (ref 78.0–100.0)
MYELOCYTES: 0 %
Metamyelocytes Relative: 0 %
Monocytes Absolute: 0.4 10*3/uL (ref 0.1–1.0)
Monocytes Relative: 4 %
NEUTROS PCT: 23 %
NRBC: 0 /100{WBCs}
Neutro Abs: 2.2 10*3/uL (ref 1.7–7.7)
Other: 0 %
PLATELETS: 70 10*3/uL — AB (ref 150–400)
PROMYELOCYTES ABS: 0 %
RBC: 2.48 MIL/uL — AB (ref 4.22–5.81)
RDW: 20.1 % — ABNORMAL HIGH (ref 11.5–15.5)
WBC: 9.4 10*3/uL (ref 4.0–10.5)

## 2017-06-25 LAB — SAMPLE TO BLOOD BANK

## 2017-06-28 ENCOUNTER — Other Ambulatory Visit (HOSPITAL_COMMUNITY): Payer: Self-pay

## 2017-06-28 DIAGNOSIS — D464 Refractory anemia, unspecified: Secondary | ICD-10-CM

## 2017-06-28 NOTE — Progress Notes (Signed)
b

## 2017-07-02 ENCOUNTER — Encounter (HOSPITAL_COMMUNITY): Payer: Self-pay | Admitting: *Deleted

## 2017-07-02 ENCOUNTER — Telehealth: Payer: Self-pay | Admitting: *Deleted

## 2017-07-02 ENCOUNTER — Inpatient Hospital Stay (HOSPITAL_COMMUNITY): Payer: Medicare HMO

## 2017-07-02 ENCOUNTER — Other Ambulatory Visit (HOSPITAL_COMMUNITY): Payer: Self-pay | Admitting: *Deleted

## 2017-07-02 DIAGNOSIS — D464 Refractory anemia, unspecified: Secondary | ICD-10-CM

## 2017-07-02 DIAGNOSIS — D4621 Refractory anemia with excess of blasts 1: Secondary | ICD-10-CM | POA: Diagnosis not present

## 2017-07-02 LAB — CBC WITH DIFFERENTIAL/PLATELET
BASOS ABS: 0 10*3/uL (ref 0.0–0.1)
Band Neutrophils: 1 %
Basophils Relative: 0 %
Blasts: 0 %
EOS PCT: 0 %
Eosinophils Absolute: 0 10*3/uL (ref 0.0–0.7)
HEMATOCRIT: 22.2 % — AB (ref 39.0–52.0)
Hemoglobin: 7.1 g/dL — ABNORMAL LOW (ref 13.0–17.0)
LYMPHS PCT: 15 %
Lymphs Abs: 4.4 10*3/uL — ABNORMAL HIGH (ref 0.7–4.0)
MCH: 32 pg (ref 26.0–34.0)
MCHC: 32 g/dL (ref 30.0–36.0)
MCV: 100 fL (ref 78.0–100.0)
MYELOCYTES: 1 %
Metamyelocytes Relative: 3 %
Monocytes Absolute: 0.6 10*3/uL (ref 0.1–1.0)
Monocytes Relative: 2 %
NRBC: 1 /100{WBCs} — AB
Neutro Abs: 6.7 10*3/uL (ref 1.7–7.7)
Neutrophils Relative %: 18 %
OTHER: 60 %
PLATELETS: 65 10*3/uL — AB (ref 150–400)
PROMYELOCYTES ABS: 0 %
RBC: 2.22 MIL/uL — AB (ref 4.22–5.81)
RDW: 20.5 % — ABNORMAL HIGH (ref 11.5–15.5)
WBC: 29.3 10*3/uL — AB (ref 4.0–10.5)

## 2017-07-02 LAB — SAMPLE TO BLOOD BANK

## 2017-07-02 LAB — PREPARE RBC (CROSSMATCH)

## 2017-07-02 MED ORDER — SODIUM CHLORIDE 0.9 % IV SOLN: 250.0000 mL | Freq: Once | INTRAVENOUS | Status: DC

## 2017-07-02 MED ORDER — ACETAMINOPHEN 325 MG PO TABS: 650.0000 mg | ORAL_TABLET | Freq: Once | ORAL | Status: DC

## 2017-07-02 MED ORDER — DIPHENHYDRAMINE HCL 25 MG PO CAPS: 25.0000 mg | ORAL_CAPSULE | Freq: Once | ORAL | Status: DC

## 2017-07-02 MED ORDER — SODIUM CHLORIDE 0.9% FLUSH: 10.0000 mL | INTRAVENOUS | Status: DC | PRN

## 2017-07-02 NOTE — Progress Notes (Signed)
Lab called and reported that his slides had atypical cells, and some blasts present.  They are putting in a path smear review order under [name], NP name since she orders his blood work this morning.    Patient is waiting on lab results and he reports that he is symptomatic.  He is tired, fatigued, no energy, falls asleep easily.  He wants to get some blood.    I spoke with Dr. Walden Field about the above and she has ordered 2 units of blood to be transfused.  She also states that patient needs to be seen sooner than his scheduled appt on 3/25 due to his abnormal blood work. Orders are in and appointments for blood and doctors visit are made for tomorrow.    Patient is aware and will be here tomorrow at [place].

## 2017-07-02 NOTE — Telephone Encounter (Signed)
Pt walked in office because he got a green card in the mail to call the nurses back. Discussed with pt his bloodwork results. See lab results from 2/26. Pt verbalized understanding.

## 2017-07-03 ENCOUNTER — Inpatient Hospital Stay (HOSPITAL_COMMUNITY): Payer: Medicare HMO

## 2017-07-03 ENCOUNTER — Ambulatory Visit (HOSPITAL_COMMUNITY): Payer: Medicare HMO | Admitting: Internal Medicine

## 2017-07-03 ENCOUNTER — Encounter (HOSPITAL_COMMUNITY): Payer: Self-pay

## 2017-07-03 DIAGNOSIS — D464 Refractory anemia, unspecified: Secondary | ICD-10-CM

## 2017-07-03 DIAGNOSIS — D4621 Refractory anemia with excess of blasts 1: Secondary | ICD-10-CM | POA: Diagnosis not present

## 2017-07-03 LAB — TYPE AND SCREEN
ABO/RH(D): A POS
ANTIBODY SCREEN: NEGATIVE

## 2017-07-03 LAB — PATHOLOGIST SMEAR REVIEW

## 2017-07-03 MED ORDER — SODIUM CHLORIDE 0.9 % IV SOLN
250.0000 mL | Freq: Once | INTRAVENOUS | Status: AC
  Administered 2017-07-03: 250 mL via INTRAVENOUS

## 2017-07-03 MED ORDER — ACETAMINOPHEN 325 MG PO TABS
650.0000 mg | ORAL_TABLET | Freq: Once | ORAL | Status: AC
  Administered 2017-07-03: 650 mg via ORAL
  Filled 2017-07-03: qty 2

## 2017-07-03 MED ORDER — DIPHENHYDRAMINE HCL 25 MG PO CAPS
25.0000 mg | ORAL_CAPSULE | Freq: Once | ORAL | Status: AC
  Administered 2017-07-03: 25 mg via ORAL
  Filled 2017-07-03: qty 1

## 2017-07-03 MED ORDER — SODIUM CHLORIDE 0.9% FLUSH
10.0000 mL | INTRAVENOUS | Status: AC | PRN
  Administered 2017-07-03: 10 mL

## 2017-07-03 NOTE — Patient Instructions (Signed)
[facility] at [facility] Discharge Instructions  Received 2 units of PRBC's today. Follow-up as scheduled. Call clinic for any questions or concerns   Thank you for choosing [facility] at [facility] to provide your oncology and hematology care.  To afford each patient quality time with our provider, please arrive at least 15 minutes before your scheduled appointment time.   If you have a lab appointment with the Cancer Center please come in thru the  Main Entrance and check in at the main information desk  You need to re-schedule your appointment should you arrive 10 or more minutes late.  We strive to give you quality time with our providers, and arriving late affects you and other patients whose appointments are after yours.  Also, if you no show three or more times for appointments you may be dismissed from the clinic at the providers discretion.     Again, thank you for choosing [facility].  Our hope is that these requests will decrease the amount of time that you wait before being seen by our physicians.       _____________________________________________________________  Should you have questions after your visit to [facility], please contact our office at [phone] between the hours of 8:30 a.m. and 4:30 p.m.  Voicemails left after 4:30 p.m. will not be returned until the following business day.  For prescription refill requests, have your pharmacy contact our office.       Resources For Cancer Patients and their Caregivers ? American Cancer Society: Can assist with transportation, wigs, general needs, runs Look Good Feel Better.        1-888-227-6333 ? Cancer Care: Provides financial assistance, online support groups, medication/co-pay assistance.  1-800-813-HOPE (4673) ? [facility] Assists [place] cancer patients and their families through emotional , educational and  financial support.  336-427-4357 ? [place] DSS Where to apply for food stamps, Medicaid and utility assistance. 336-342-1394 ? [facility]: Transportation to medical appointments. 336-347-2287 ? Social Security Administration: May apply for disability if have a Stage IV cancer. 336-342-7796 1-800-772-1213 ? [place] Aging, Disability and Transit Services: Assists with nutrition, care and transit needs. 336-349-2343  Cancer Center Support Programs:   > Cancer Support Group  2nd Tuesday of the month 1pm-2pm, Journey Room   > Creative Journey  3rd Tuesday of the month 1130am-1pm, Journey Room    

## 2017-07-03 NOTE — Progress Notes (Signed)
Omar Lee tolerated blood trnasfusion well without complaints or incident. VSS prior to,during and after each transfusion. Pt discharged self ambulatory in satisfactory condition

## 2017-07-04 ENCOUNTER — Other Ambulatory Visit: Payer: Self-pay

## 2017-07-04 ENCOUNTER — Inpatient Hospital Stay (HOSPITAL_BASED_OUTPATIENT_CLINIC_OR_DEPARTMENT_OTHER): Payer: Medicare HMO | Admitting: Hematology

## 2017-07-04 ENCOUNTER — Encounter (HOSPITAL_COMMUNITY): Payer: Self-pay | Admitting: Hematology

## 2017-07-04 DIAGNOSIS — D469 Myelodysplastic syndrome, unspecified: Secondary | ICD-10-CM

## 2017-07-04 DIAGNOSIS — D4621 Refractory anemia with excess of blasts 1: Secondary | ICD-10-CM | POA: Diagnosis not present

## 2017-07-04 LAB — TYPE AND SCREEN
ABO/RH(D): A POS
Antibody Screen: NEGATIVE
Unit division: 0
Unit division: 0
Unit division: 0

## 2017-07-04 LAB — BPAM RBC
BLOOD PRODUCT EXPIRATION DATE: 201904062359
Blood Product Expiration Date: 201904082359
Blood Product Expiration Date: 201904082359
ISSUE DATE / TIME: 201903190952
ISSUE DATE / TIME: 201903191204
UNIT TYPE AND RH: 600
Unit Type and Rh: 6200
Unit Type and Rh: 6200

## 2017-07-04 NOTE — Progress Notes (Signed)
Omar Lee, [place] [address]   CLINIC:  Medical Oncology/Hematology  PCP:  [name], [place] [address] [phone]   REASON FOR VISIT:  Follow-up for high-grade MDS.  CURRENT THERAPY: [name]  BRIEF ONCOLOGIC HISTORY: Diagnosed with refractory anemia with excess blasts in early 2018, started on [name] injections, received Procrit and G-CSF without much help, continued to have blood transfusions     INTERVAL HISTORY:  Omar Lee 77 y.o. male returns for follow-up of his MDS.  Most recent CBC 2 days ago showed elevated white count to 23,000.  His hemoglobin was also low.  As result he received 2 units of blood transfusion yesterday.  His energy levels have slightly improved.  He denies any fevers or infections.  He denies any nausea, vomiting or diarrhea.  He is accompanied by his friend today.  He lives by himself at home. Marland Kitchen     REVIEW OF SYSTEMS:  Review of Systems  Constitutional: Positive for fatigue.  HENT:  Negative.   Respiratory: Negative.   Cardiovascular: Negative.   Gastrointestinal: Negative.   Genitourinary: Positive for frequency.   Musculoskeletal: Negative.   Skin: Negative.   Neurological: Negative.   Psychiatric/Behavioral: Negative.      PAST MEDICAL/SURGICAL HISTORY:  Past Medical History:  Diagnosis Date  . Cancer ([place])    MDS  . Hyperlipidemia   . Hypothyroidism   . Reflux   . Sleep apnea    Past Surgical History:  Procedure Laterality Date  . ESOPHAGOGASTRODUODENOSCOPY (EGD) WITH ESOPHAGEAL DILATION N/A 11/22/2012   Procedure: ESOPHAGOGASTRODUODENOSCOPY (EGD) WITH ESOPHAGEAL DILATION;  Surgeon: Rogene Houston, MD;  [facility];  Service: Endoscopy;  Laterality: N/A;  1120  . HEMORRHOID SURGERY    . NASAL SEPTUM SURGERY    . SHOULDER SURGERY Right   . TONSILLECTOMY AND ADENOIDECTOMY       SOCIAL HISTORY:  Social History   Socioeconomic  History  . Marital status: Divorced    Spouse name: Not on file  . Number of children: Not on file  . Years of education: Not on file  . Highest education level: Not on file  Social Needs  . Financial resource strain: Not on file  . Food insecurity - worry: Not on file  . Food insecurity - inability: Not on file  . Transportation needs - medical: Not on file  . Transportation needs - non-medical: Not on file  Occupational History  . Not on file  Tobacco Use  . Smoking status: Never Smoker  . Smokeless tobacco: Never Used  Substance and Sexual Activity  . Alcohol use: No  . Drug use: No  . Sexual activity: Not Currently    Birth control/protection: None  Other Topics Concern  . Not on file  Social History Narrative  . Not on file    FAMILY HISTORY:  Family History  Problem Relation Age of Onset  . Hypertension Mother   . Heart attack Mother   . Heart attack Father   . Diabetes Brother     CURRENT MEDICATIONS:  Outpatient Encounter Medications as of 07/04/2017  Medication Sig  . acetaminophen (TYLENOL) 325 MG tablet Take 650 mg by mouth as needed.  Marland Kitchen levothyroxine (SYNTHROID, LEVOTHROID) 112 MCG tablet Take 1 tablet (112 mcg total) by mouth daily.   Facility-Administered Encounter Medications as of 07/04/2017  Medication  . 0.9 %  sodium chloride infusion  . acetaminophen (TYLENOL) tablet  650 mg  . diphenhydrAMINE (BENADRYL) capsule 25 mg  . sodium chloride flush (NS) 0.9 % injection 10 mL  . sodium chloride flush (NS) 0.9 % injection 10 mL  . sodium chloride flush (NS) 0.9 % injection 10 mL    ALLERGIES:  No Known Allergies   PHYSICAL EXAM:  ECOG Performance status:2 There were no vitals filed for this visit. There were no vitals filed for this visit.    LABORATORY DATA:  I have reviewed the labs as listed.  CBC    Component Value Date/Time   WBC 29.3 (H) 07/02/2017 0855   RBC 2.22 (L) 07/02/2017 0855   HGB 7.1 (L) 07/02/2017 0855   HGB 6.7 (LL)  06/23/2016 0825   HCT 22.2 (L) 07/02/2017 0855   HCT 20.1 (L) 06/23/2016 0825   PLT 65 (L) 07/02/2017 0855   PLT 182 06/23/2016 0825   MCV 100.0 07/02/2017 0855   MCV 106 (H) 06/23/2016 0825   MCH 32.0 07/02/2017 0855   MCHC 32.0 07/02/2017 0855   RDW 20.5 (H) 07/02/2017 0855   RDW 16.4 (H) 06/23/2016 0825   LYMPHSABS 4.4 (H) 07/02/2017 0855   LYMPHSABS 0.9 06/23/2016 0825   MONOABS 0.6 07/02/2017 0855   EOSABS 0.0 07/02/2017 0855   EOSABS 0.0 06/23/2016 0825   BASOSABS 0.0 07/02/2017 0855   BASOSABS 0.0 06/23/2016 0825   CMP Latest Ref Rng & Units 09/08/2016 08/10/2016 07/26/2016  Glucose 65 - 99 mg/dL 110(H) 97 98  BUN 6 - 20 mg/dL 19 20 18   Creatinine 0.61 - 1.24 mg/dL 1.09 0.98 0.92  Sodium 135 - 145 mmol/L 137 137 134(L)  Potassium 3.5 - 5.1 mmol/L 3.6 4.0 4.4  Chloride 101 - 111 mmol/L 105 106 103  CO2 22 - 32 mmol/L 23 25 24   Calcium 8.9 - 10.3 mg/dL 8.7(L) 8.5(L) 8.7(L)  Total Protein 6.5 - 8.1 g/dL 6.6 6.6 6.7  Total Bilirubin 0.3 - 1.2 mg/dL 1.3(H) 1.0 1.4(H)  Alkaline Phos 38 - 126 U/L 42 44 47  AST 15 - 41 U/L 29 26 24   ALT 17 - 63 U/L 21 21 18           ASSESSMENT & PLAN:    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  His white count increased to 22,000.  There were abnormal cells including blasts found on the peripheral smear.  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  I have offered him a referral to a tertiary care facility.  He does not want to go outside [facility].  So far he has been treated with [name], followed by loss of response.  He also received Procrit 60,000 with G-CSF.  He could not tolerated.  He is continuing to be transfusion dependent.  I have recommended doing a bone marrow aspiration and biopsy to confirm transformation.  He is agreeable to this plan.  We will schedule it as soon as possible, early  next week.  We will send for all the studies including flow cytometry, chromosome analysis and FISH panel.      Orders placed this encounter:  Orders Placed This Encounter  Procedures  . CBC with Differential  . Comprehensive metabolic panel  . Lactate dehydrogenase      Derek Jack MD  [place] [phone]

## 2017-07-06 ENCOUNTER — Ambulatory Visit (HOSPITAL_COMMUNITY): Payer: Medicare HMO | Admitting: Hematology

## 2017-07-09 ENCOUNTER — Ambulatory Visit (HOSPITAL_COMMUNITY): Payer: Medicare HMO | Admitting: Hematology

## 2017-07-09 ENCOUNTER — Other Ambulatory Visit (HOSPITAL_COMMUNITY): Payer: Medicare HMO

## 2017-07-09 ENCOUNTER — Inpatient Hospital Stay (HOSPITAL_COMMUNITY): Payer: Medicare HMO

## 2017-07-09 DIAGNOSIS — D469 Myelodysplastic syndrome, unspecified: Secondary | ICD-10-CM

## 2017-07-09 DIAGNOSIS — D4621 Refractory anemia with excess of blasts 1: Secondary | ICD-10-CM | POA: Diagnosis not present

## 2017-07-09 DIAGNOSIS — D464 Refractory anemia, unspecified: Secondary | ICD-10-CM

## 2017-07-09 LAB — CBC WITH DIFFERENTIAL/PLATELET
BASOS ABS: 0 10*3/uL (ref 0.0–0.1)
BLASTS: 8 %
Band Neutrophils: 1 %
Basophils Relative: 0 %
Eosinophils Absolute: 0 10*3/uL (ref 0.0–0.7)
Eosinophils Relative: 0 %
HEMATOCRIT: 27.3 % — AB (ref 39.0–52.0)
HEMOGLOBIN: 8.8 g/dL — AB (ref 13.0–17.0)
Lymphocytes Relative: 81 %
Lymphs Abs: 52.2 10*3/uL — ABNORMAL HIGH (ref 0.7–4.0)
MCH: 31.4 pg (ref 26.0–34.0)
MCHC: 32.2 g/dL (ref 30.0–36.0)
MCV: 97.5 fL (ref 78.0–100.0)
METAMYELOCYTES PCT: 3 %
MONOS PCT: 2 %
Monocytes Absolute: 1.3 10*3/uL — ABNORMAL HIGH (ref 0.1–1.0)
Myelocytes: 1 %
Neutro Abs: 5.8 10*3/uL (ref 1.7–7.7)
Neutrophils Relative %: 4 %
Other: 0 %
PROMYELOCYTES ABS: 0 %
Platelets: 53 10*3/uL — ABNORMAL LOW (ref 150–400)
RBC: 2.8 MIL/uL — AB (ref 4.22–5.81)
RDW: 19.8 % — ABNORMAL HIGH (ref 11.5–15.5)
WBC: 64.4 10*3/uL — AB (ref 4.0–10.5)
nRBC: 0 /100 WBC

## 2017-07-09 LAB — SAMPLE TO BLOOD BANK

## 2017-07-09 LAB — COMPREHENSIVE METABOLIC PANEL
ALK PHOS: 58 U/L (ref 38–126)
ALT: 60 U/L (ref 17–63)
AST: 40 U/L (ref 15–41)
Albumin: 3.9 g/dL (ref 3.5–5.0)
Anion gap: 9 (ref 5–15)
BUN: 26 mg/dL — ABNORMAL HIGH (ref 6–20)
CALCIUM: 8.7 mg/dL — AB (ref 8.9–10.3)
CO2: 25 mmol/L (ref 22–32)
CREATININE: 1.18 mg/dL (ref 0.61–1.24)
Chloride: 104 mmol/L (ref 101–111)
GFR, EST NON AFRICAN AMERICAN: 58 mL/min — AB (ref >60)
Glucose, Bld: 112 mg/dL — ABNORMAL HIGH (ref 65–99)
Potassium: 3.9 mmol/L (ref 3.5–5.1)
Sodium: 138 mmol/L (ref 135–145)
Total Bilirubin: 0.6 mg/dL (ref 0.3–1.2)
Total Protein: 6.6 g/dL (ref 6.5–8.1)

## 2017-07-09 LAB — LACTATE DEHYDROGENASE: LDH: 365 U/L — AB (ref 98–192)

## 2017-07-09 MED ORDER — HYDROXYUREA 500 MG PO CAPS: 500.0000 mg | ORAL_CAPSULE | Freq: Two times a day (BID) | ORAL | 0 refills | Status: DC

## 2017-07-09 MED ORDER — ALLOPURINOL 300 MG PO TABS: 300.0000 mg | ORAL_TABLET | Freq: Every day | ORAL | 0 refills | Status: DC

## 2017-07-09 NOTE — Progress Notes (Signed)
Dr. Raliegh Ip spoke with Dr. Florene Glen at [facility]. I have called and spoke with Anderson Malta at [phone] to give patient demographics and insurance info. Patient has appt at 3rd floor cancer center at [facility]. He needs to arrive at 1315 with labs at 1330 and then seeing Dr. Florene Glen at 1400 with bone marrow biopsy to follow. Park in parking deck 'C'. Have attempted to contact patient at both numbers listed and his emergency contact, Otila Kluver. Have left messages requesting they call me so I can give them this important information concerning his appt tomorrow. Will continue to try to reach patient.

## 2017-07-09 NOTE — Progress Notes (Signed)
Reviewed labs with Dr. Raliegh Ip. He ordered the patient to be started on Hydrea 500 mg po BID and Allopurinol 300 mg po daily at least until he gets his bone marrow biopsy performed later this week and the results from it. Went over directions with patient and why Dr. Raliegh Ip wants him to start this medication. Patient verbalized understanding.

## 2017-07-09 NOTE — Progress Notes (Signed)
CRITICAL VALUE ALERT Critical value received:  WBC-64.4 Date of notification:  07/09/17 Time of notification: 5248 Critical value read back:  Yes.   Nurse who received alert:  M.Kyel Purk. LPN MD notified (1st page):  S.Katragadda, MD

## 2017-07-10 DIAGNOSIS — D469 Myelodysplastic syndrome, unspecified: Secondary | ICD-10-CM | POA: Diagnosis not present

## 2017-07-10 DIAGNOSIS — E039 Hypothyroidism, unspecified: Secondary | ICD-10-CM | POA: Diagnosis not present

## 2017-07-10 DIAGNOSIS — C9500 Acute leukemia of unspecified cell type not having achieved remission: Secondary | ICD-10-CM | POA: Diagnosis not present

## 2017-07-10 DIAGNOSIS — R2681 Unsteadiness on feet: Secondary | ICD-10-CM | POA: Diagnosis not present

## 2017-07-10 DIAGNOSIS — K219 Gastro-esophageal reflux disease without esophagitis: Secondary | ICD-10-CM | POA: Diagnosis not present

## 2017-07-10 DIAGNOSIS — E785 Hyperlipidemia, unspecified: Secondary | ICD-10-CM | POA: Diagnosis not present

## 2017-07-10 DIAGNOSIS — C9200 Acute myeloblastic leukemia, not having achieved remission: Secondary | ICD-10-CM | POA: Diagnosis not present

## 2017-07-10 DIAGNOSIS — R42 Dizziness and giddiness: Secondary | ICD-10-CM | POA: Diagnosis not present

## 2017-07-12 ENCOUNTER — Ambulatory Visit (HOSPITAL_COMMUNITY): Payer: Medicare HMO | Admitting: Hematology

## 2017-07-13 ENCOUNTER — Encounter (HOSPITAL_COMMUNITY): Payer: Self-pay

## 2017-07-13 ENCOUNTER — Other Ambulatory Visit (HOSPITAL_COMMUNITY): Payer: Self-pay

## 2017-07-13 DIAGNOSIS — T451X5A Adverse effect of antineoplastic and immunosuppressive drugs, initial encounter: Secondary | ICD-10-CM | POA: Diagnosis not present

## 2017-07-13 DIAGNOSIS — D709 Neutropenia, unspecified: Secondary | ICD-10-CM | POA: Diagnosis not present

## 2017-07-13 DIAGNOSIS — D6959 Other secondary thrombocytopenia: Secondary | ICD-10-CM | POA: Diagnosis not present

## 2017-07-13 DIAGNOSIS — K219 Gastro-esophageal reflux disease without esophagitis: Secondary | ICD-10-CM | POA: Diagnosis not present

## 2017-07-13 DIAGNOSIS — D696 Thrombocytopenia, unspecified: Secondary | ICD-10-CM | POA: Diagnosis not present

## 2017-07-13 DIAGNOSIS — R042 Hemoptysis: Secondary | ICD-10-CM | POA: Diagnosis not present

## 2017-07-13 DIAGNOSIS — K5900 Constipation, unspecified: Secondary | ICD-10-CM | POA: Diagnosis not present

## 2017-07-13 DIAGNOSIS — E878 Other disorders of electrolyte and fluid balance, not elsewhere classified: Secondary | ICD-10-CM | POA: Diagnosis not present

## 2017-07-13 DIAGNOSIS — D649 Anemia, unspecified: Secondary | ICD-10-CM | POA: Diagnosis not present

## 2017-07-13 DIAGNOSIS — C9200 Acute myeloblastic leukemia, not having achieved remission: Secondary | ICD-10-CM

## 2017-07-13 DIAGNOSIS — E039 Hypothyroidism, unspecified: Secondary | ICD-10-CM | POA: Diagnosis not present

## 2017-07-13 DIAGNOSIS — J984 Other disorders of lung: Secondary | ICD-10-CM | POA: Diagnosis not present

## 2017-07-13 DIAGNOSIS — E785 Hyperlipidemia, unspecified: Secondary | ICD-10-CM | POA: Diagnosis not present

## 2017-07-13 DIAGNOSIS — K047 Periapical abscess without sinus: Secondary | ICD-10-CM | POA: Diagnosis not present

## 2017-07-13 DIAGNOSIS — R197 Diarrhea, unspecified: Secondary | ICD-10-CM | POA: Diagnosis not present

## 2017-07-13 DIAGNOSIS — D6481 Anemia due to antineoplastic chemotherapy: Secondary | ICD-10-CM | POA: Diagnosis not present

## 2017-07-13 DIAGNOSIS — D61818 Other pancytopenia: Secondary | ICD-10-CM | POA: Diagnosis not present

## 2017-07-13 DIAGNOSIS — D72829 Elevated white blood cell count, unspecified: Secondary | ICD-10-CM | POA: Diagnosis not present

## 2017-07-13 DIAGNOSIS — D464 Refractory anemia, unspecified: Secondary | ICD-10-CM

## 2017-07-13 DIAGNOSIS — R9431 Abnormal electrocardiogram [ECG] [EKG]: Secondary | ICD-10-CM | POA: Diagnosis not present

## 2017-07-13 DIAGNOSIS — E876 Hypokalemia: Secondary | ICD-10-CM | POA: Diagnosis not present

## 2017-07-13 DIAGNOSIS — D630 Anemia in neoplastic disease: Secondary | ICD-10-CM | POA: Diagnosis not present

## 2017-07-13 DIAGNOSIS — Z5111 Encounter for antineoplastic chemotherapy: Secondary | ICD-10-CM | POA: Diagnosis not present

## 2017-07-13 DIAGNOSIS — D61810 Antineoplastic chemotherapy induced pancytopenia: Secondary | ICD-10-CM | POA: Diagnosis not present

## 2017-07-13 DIAGNOSIS — Z66 Do not resuscitate: Secondary | ICD-10-CM | POA: Diagnosis not present

## 2017-07-13 NOTE — Progress Notes (Signed)
Received orders from [facility] for bi-weekly labs (with transfusion parameters) and PICC line dressing changes. Reviewed with Dr. Delton Coombes. Orders entered.

## 2017-07-16 ENCOUNTER — Other Ambulatory Visit (HOSPITAL_COMMUNITY): Payer: Medicare HMO

## 2017-07-19 ENCOUNTER — Encounter (HOSPITAL_COMMUNITY): Payer: Medicare HMO

## 2017-07-23 ENCOUNTER — Other Ambulatory Visit (HOSPITAL_COMMUNITY): Payer: Medicare HMO

## 2017-07-23 ENCOUNTER — Encounter (HOSPITAL_COMMUNITY): Payer: Medicare HMO

## 2017-07-26 ENCOUNTER — Encounter (HOSPITAL_COMMUNITY): Payer: Medicare HMO

## 2017-07-26 MED ORDER — VENETOCLAX 100 MG PO TABS
200.00 | ORAL_TABLET | ORAL | Status: DC
Start: 2017-08-04 — End: 2017-07-26

## 2017-07-26 MED ORDER — LEVOTHYROXINE SODIUM 112 MCG PO TABS
112.00 | ORAL_TABLET | ORAL | Status: DC
Start: 2017-08-05 — End: 2017-07-26

## 2017-07-26 MED ORDER — BENZONATATE 100 MG PO CAPS
100.00 | ORAL_CAPSULE | ORAL | Status: DC
Start: ? — End: 2017-07-26

## 2017-07-26 MED ORDER — SODIUM CHLORIDE 0.9 % IV SOLN
INTRAVENOUS | Status: DC
Start: ? — End: 2017-07-26

## 2017-07-26 MED ORDER — TRAMADOL HCL 50 MG PO TABS
50.00 | ORAL_TABLET | ORAL | Status: DC
Start: ? — End: 2017-07-26

## 2017-07-26 MED ORDER — MELATONIN 3 MG PO TABS
3.00 | ORAL_TABLET | ORAL | Status: DC
Start: 2017-08-04 — End: 2017-07-26

## 2017-07-26 MED ORDER — FLUCONAZOLE 200 MG PO TABS
200.00 | ORAL_TABLET | ORAL | Status: DC
Start: 2017-08-05 — End: 2017-07-26

## 2017-07-26 MED ORDER — MOXIFLOXACIN HCL 400 MG PO TABS
400.00 | ORAL_TABLET | ORAL | Status: DC
Start: 2017-08-04 — End: 2017-07-26

## 2017-07-26 MED ORDER — GENERIC EXTERNAL MEDICATION
5.00 | Status: DC
Start: ? — End: 2017-07-26

## 2017-07-26 MED ORDER — PANTOPRAZOLE SODIUM 40 MG PO TBEC
40.00 | DELAYED_RELEASE_TABLET | ORAL | Status: DC
Start: 2017-08-05 — End: 2017-07-26

## 2017-07-26 MED ORDER — ONDANSETRON HCL 4 MG/2ML IJ SOLN
4.00 | INTRAMUSCULAR | Status: DC
Start: ? — End: 2017-07-26

## 2017-07-26 MED ORDER — SODIUM CHLORIDE 0.9 % IJ SOLN
20.00 | INTRAMUSCULAR | Status: DC
Start: 2017-08-05 — End: 2017-07-26

## 2017-07-26 MED ORDER — SODIUM CHLORIDE 0.9 % IJ SOLN
20.00 | INTRAMUSCULAR | Status: DC
Start: ? — End: 2017-07-26

## 2017-07-30 ENCOUNTER — Other Ambulatory Visit (HOSPITAL_COMMUNITY): Payer: Medicare HMO

## 2017-07-30 ENCOUNTER — Ambulatory Visit (HOSPITAL_COMMUNITY): Payer: Medicare HMO | Admitting: Hematology

## 2017-07-30 ENCOUNTER — Encounter (HOSPITAL_COMMUNITY): Payer: Medicare HMO

## 2017-08-04 MED ORDER — SODIUM CHLORIDE 0.9 % IV SOLN
250.00 | INTRAVENOUS | Status: DC
Start: ? — End: 2017-08-04

## 2017-08-06 ENCOUNTER — Other Ambulatory Visit (HOSPITAL_COMMUNITY): Payer: Medicare HMO

## 2017-08-07 DIAGNOSIS — D709 Neutropenia, unspecified: Secondary | ICD-10-CM | POA: Diagnosis not present

## 2017-08-07 DIAGNOSIS — E039 Hypothyroidism, unspecified: Secondary | ICD-10-CM | POA: Diagnosis not present

## 2017-08-07 DIAGNOSIS — E785 Hyperlipidemia, unspecified: Secondary | ICD-10-CM | POA: Diagnosis not present

## 2017-08-07 DIAGNOSIS — C9200 Acute myeloblastic leukemia, not having achieved remission: Secondary | ICD-10-CM | POA: Diagnosis not present

## 2017-08-07 DIAGNOSIS — R600 Localized edema: Secondary | ICD-10-CM | POA: Diagnosis not present

## 2017-08-07 DIAGNOSIS — D469 Myelodysplastic syndrome, unspecified: Secondary | ICD-10-CM | POA: Diagnosis not present

## 2017-08-07 DIAGNOSIS — D61818 Other pancytopenia: Secondary | ICD-10-CM | POA: Diagnosis not present

## 2017-08-09 ENCOUNTER — Inpatient Hospital Stay (HOSPITAL_COMMUNITY): Payer: Medicare HMO | Attending: Oncology

## 2017-08-09 ENCOUNTER — Other Ambulatory Visit: Payer: Self-pay

## 2017-08-09 ENCOUNTER — Encounter (HOSPITAL_COMMUNITY): Payer: Self-pay

## 2017-08-09 VITALS — BP 119/51 | HR 68 | Temp 97.9°F | Resp 18

## 2017-08-09 DIAGNOSIS — C9200 Acute myeloblastic leukemia, not having achieved remission: Secondary | ICD-10-CM | POA: Insufficient documentation

## 2017-08-09 DIAGNOSIS — D464 Refractory anemia, unspecified: Secondary | ICD-10-CM | POA: Insufficient documentation

## 2017-08-09 DIAGNOSIS — Z5111 Encounter for antineoplastic chemotherapy: Secondary | ICD-10-CM | POA: Insufficient documentation

## 2017-08-09 DIAGNOSIS — Z452 Encounter for adjustment and management of vascular access device: Secondary | ICD-10-CM

## 2017-08-09 LAB — COMPREHENSIVE METABOLIC PANEL
ALBUMIN: 4 g/dL (ref 3.5–5.0)
ALK PHOS: 75 U/L (ref 38–126)
ALT: 35 U/L (ref 17–63)
AST: 25 U/L (ref 15–41)
Anion gap: 12 (ref 5–15)
BILIRUBIN TOTAL: 1.2 mg/dL (ref 0.3–1.2)
BUN: 26 mg/dL — AB (ref 6–20)
CALCIUM: 9.3 mg/dL (ref 8.9–10.3)
CO2: 22 mmol/L (ref 22–32)
Chloride: 105 mmol/L (ref 101–111)
Creatinine, Ser: 1.27 mg/dL — ABNORMAL HIGH (ref 0.61–1.24)
GFR calc Af Amer: >60 mL/min (ref >60)
GFR calc non Af Amer: 53 mL/min — ABNORMAL LOW (ref >60)
GLUCOSE: 99 mg/dL (ref 65–99)
Potassium: 4.2 mmol/L (ref 3.5–5.1)
Sodium: 139 mmol/L (ref 135–145)
TOTAL PROTEIN: 6.7 g/dL (ref 6.5–8.1)

## 2017-08-09 LAB — CBC WITH DIFFERENTIAL/PLATELET
BASOS ABS: 0 10*3/uL (ref 0.0–0.1)
BASOS PCT: 0 %
EOS PCT: 0 %
Eosinophils Absolute: 0 10*3/uL (ref 0.0–0.7)
HCT: 26.9 % — ABNORMAL LOW (ref 39.0–52.0)
Hemoglobin: 8.9 g/dL — ABNORMAL LOW (ref 13.0–17.0)
LYMPHS PCT: 40 %
Lymphs Abs: 0.5 10*3/uL — ABNORMAL LOW (ref 0.7–4.0)
MCH: 29.9 pg (ref 26.0–34.0)
MCHC: 33.1 g/dL (ref 30.0–36.0)
MCV: 90.3 fL (ref 78.0–100.0)
MONO ABS: 0.7 10*3/uL (ref 0.1–1.0)
Monocytes Relative: 53 %
Neutro Abs: 0.1 10*3/uL — ABNORMAL LOW (ref 1.7–7.7)
Neutrophils Relative %: 7 %
PLATELETS: 30 10*3/uL — AB (ref 150–400)
RBC: 2.98 MIL/uL — ABNORMAL LOW (ref 4.22–5.81)
RDW: 15.5 % (ref 11.5–15.5)
WBC: 1.2 10*3/uL — CL (ref 4.0–10.5)

## 2017-08-09 LAB — MAGNESIUM: Magnesium: 2 mg/dL (ref 1.7–2.4)

## 2017-08-09 MED ORDER — HEPARIN SOD (PORK) LOCK FLUSH 100 UNIT/ML IV SOLN
500.0000 [IU] | Freq: Once | INTRAVENOUS | Status: DC
  Filled 2017-08-09: qty 5

## 2017-08-09 MED ORDER — SODIUM CHLORIDE 0.9% FLUSH
10.0000 mL | INTRAVENOUS | Status: DC | PRN
  Administered 2017-08-09: 10 mL via INTRAVENOUS
  Filled 2017-08-09: qty 10

## 2017-08-09 MED ORDER — HEPARIN SOD (PORK) LOCK FLUSH 100 UNIT/ML IV SOLN
250.0000 [IU] | Freq: Once | INTRAVENOUS | Status: AC
  Administered 2017-08-09: 250 [IU] via INTRAVENOUS

## 2017-08-09 NOTE — Progress Notes (Signed)
Omar Lee presented for PICC flush and dressing change.  See IV assessment in docflowsheets for PICC details.  PICC located right arm.  Good blood return present. PICC flushed with 20ml NS and 250U Heparin, see MAR for further details.  Omar Lee tolerated procedure well and without incident.  Discharged ambulatory.

## 2017-08-09 NOTE — Addendum Note (Signed)
Addended by: Joie Bimler on: 08/09/2017 02:47 PM   Modules accepted: Miquel Dunn

## 2017-08-09 NOTE — Addendum Note (Signed)
Addended by: Joie Bimler on: 08/09/2017 02:54 PM   Modules accepted: Orders, SmartSet

## 2017-08-09 NOTE — Progress Notes (Signed)
CRITICAL VALUE ALERT  Critical Value:   WBC 1.2  Date & Time Notied:  08/09/17  Provider Notified: Dr K./ Ebony Hail RN

## 2017-08-13 ENCOUNTER — Encounter (HOSPITAL_COMMUNITY): Payer: Self-pay

## 2017-08-13 ENCOUNTER — Inpatient Hospital Stay (HOSPITAL_COMMUNITY): Payer: Medicare HMO

## 2017-08-13 ENCOUNTER — Encounter (HOSPITAL_COMMUNITY): Payer: Self-pay | Admitting: Hematology

## 2017-08-13 ENCOUNTER — Other Ambulatory Visit (HOSPITAL_COMMUNITY): Payer: Medicare HMO

## 2017-08-13 ENCOUNTER — Inpatient Hospital Stay (HOSPITAL_BASED_OUTPATIENT_CLINIC_OR_DEPARTMENT_OTHER): Payer: Medicare HMO | Admitting: Hematology

## 2017-08-13 ENCOUNTER — Other Ambulatory Visit: Payer: Self-pay

## 2017-08-13 VITALS — BP 124/52 | HR 77 | Temp 98.0°F | Resp 18 | Wt 176.4 lb

## 2017-08-13 DIAGNOSIS — C9200 Acute myeloblastic leukemia, not having achieved remission: Secondary | ICD-10-CM

## 2017-08-13 DIAGNOSIS — Z5111 Encounter for antineoplastic chemotherapy: Secondary | ICD-10-CM | POA: Diagnosis not present

## 2017-08-13 DIAGNOSIS — Z7189 Other specified counseling: Secondary | ICD-10-CM | POA: Insufficient documentation

## 2017-08-13 DIAGNOSIS — D464 Refractory anemia, unspecified: Secondary | ICD-10-CM

## 2017-08-13 LAB — COMPREHENSIVE METABOLIC PANEL
ALT: 35 U/L (ref 17–63)
ANION GAP: 7 (ref 5–15)
AST: 23 U/L (ref 15–41)
Albumin: 3.9 g/dL (ref 3.5–5.0)
Alkaline Phosphatase: 65 U/L (ref 38–126)
BUN: 23 mg/dL — ABNORMAL HIGH (ref 6–20)
CHLORIDE: 105 mmol/L (ref 101–111)
CO2: 25 mmol/L (ref 22–32)
Calcium: 9 mg/dL (ref 8.9–10.3)
Creatinine, Ser: 1.02 mg/dL (ref 0.61–1.24)
GFR calc Af Amer: >60 mL/min (ref >60)
Glucose, Bld: 107 mg/dL — ABNORMAL HIGH (ref 65–99)
Potassium: 3.8 mmol/L (ref 3.5–5.1)
SODIUM: 137 mmol/L (ref 135–145)
Total Bilirubin: 1 mg/dL (ref 0.3–1.2)
Total Protein: 6.3 g/dL — ABNORMAL LOW (ref 6.5–8.1)

## 2017-08-13 LAB — CBC WITH DIFFERENTIAL/PLATELET
BASOS PCT: 0 %
Basophils Absolute: 0 10*3/uL (ref 0.0–0.1)
Eosinophils Absolute: 0 10*3/uL (ref 0.0–0.7)
Eosinophils Relative: 0 %
HCT: 24.9 % — ABNORMAL LOW (ref 39.0–52.0)
HEMOGLOBIN: 8.1 g/dL — AB (ref 13.0–17.0)
LYMPHS ABS: 0.6 10*3/uL — AB (ref 0.7–4.0)
Lymphocytes Relative: 47 %
MCH: 29.3 pg (ref 26.0–34.0)
MCHC: 32.5 g/dL (ref 30.0–36.0)
MCV: 90.2 fL (ref 78.0–100.0)
MONOS PCT: 31 %
Monocytes Absolute: 0.4 10*3/uL (ref 0.1–1.0)
NEUTROS ABS: 0.3 10*3/uL — AB (ref 1.7–7.7)
NEUTROS PCT: 22 %
PLATELETS: 17 10*3/uL — AB (ref 150–400)
RBC: 2.76 MIL/uL — AB (ref 4.22–5.81)
RDW: 15.5 % (ref 11.5–15.5)
WBC: 1.3 10*3/uL — AB (ref 4.0–10.5)

## 2017-08-13 LAB — MAGNESIUM: MAGNESIUM: 2 mg/dL (ref 1.7–2.4)

## 2017-08-13 MED ORDER — SODIUM CHLORIDE 0.9 % IV SOLN
20.0000 mg/m2 | Freq: Once | INTRAVENOUS | Status: AC
  Administered 2017-08-13: 40 mg via INTRAVENOUS
  Filled 2017-08-13: qty 8

## 2017-08-13 MED ORDER — PROCHLORPERAZINE MALEATE 10 MG PO TABS
ORAL_TABLET | ORAL | Status: AC
  Filled 2017-08-13: qty 1

## 2017-08-13 MED ORDER — SODIUM CHLORIDE 0.9 % IV SOLN
Freq: Once | INTRAVENOUS | Status: AC
  Administered 2017-08-13: 11:00:00 via INTRAVENOUS

## 2017-08-13 MED ORDER — HEPARIN SOD (PORK) LOCK FLUSH 100 UNIT/ML IV SOLN: 250.0000 [IU] | Freq: Once | INTRAVENOUS | Status: DC | PRN

## 2017-08-13 MED ORDER — PROCHLORPERAZINE MALEATE 10 MG PO TABS
10.0000 mg | ORAL_TABLET | Freq: Once | ORAL | Status: AC
  Administered 2017-08-13: 10 mg via ORAL

## 2017-08-13 MED ORDER — SODIUM CHLORIDE 0.9% FLUSH
3.0000 mL | INTRAVENOUS | Status: DC | PRN
  Administered 2017-08-13: 3 mL
  Filled 2017-08-13: qty 10

## 2017-08-13 NOTE — Progress Notes (Signed)
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  CURRENT THERAPY: Decitabine and venetoclax.  BRIEF ONCOLOGIC HISTORY:    AML (acute myeloblastic leukemia) ([place])   07/2016 Initial Diagnosis    MDS- BM BX with 8% blasts.  Cytogenetics with minus Y only.  IPSS-R 3.5, intermediate risk (0 for cyto, 2.0 for blasts, 1.5 for Hb, 0 for platelets, 0 for ANC).      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  FLT3-ITD/TKD negative.  AML panel positive for NRAS,STAG2,TET2, U2AF1, DNMT3A      07/13/2017 -  Chemotherapy    C1 decitabine 20 mg/m2 IV daily x5 days with venetoclax 400 mg daily.  Given Hydrea prior to starting when it collects due to leukocytosis.        INTERVAL HISTORY:  Mr. Omar Lee 77 y.o. male returns for follow-up and initiation of cycle 2 for his AML.  He denies any fevers or infections in the last couple of weeks.  He had a bone marrow aspiration biopsy done last week at [facility].  His energy levels are low but gradually improving.  He denied any nausea, vomiting, diarrhea or constipation.  His appetite is also improving.  He is continuing to stay active at home.    REVIEW OF SYSTEMS:  Review of Systems  Constitutional: Positive for appetite change and fatigue.  All other systems reviewed and are negative.    PAST MEDICAL/SURGICAL HISTORY:  Past Medical History:  Diagnosis Date  . Cancer ([place])    MDS  . Hyperlipidemia   . Hypothyroidism   . Reflux   . Sleep apnea    Past  Surgical History:  Procedure Laterality Date  . ESOPHAGOGASTRODUODENOSCOPY (EGD) WITH ESOPHAGEAL DILATION N/A 11/22/2012   Procedure: ESOPHAGOGASTRODUODENOSCOPY (EGD) WITH ESOPHAGEAL DILATION;  Surgeon: Rogene Houston, MD;  Location: [facility] SUITE;  Service: Endoscopy;  Laterality: N/A;  1120  . HEMORRHOID SURGERY    . NASAL SEPTUM SURGERY    . SHOULDER SURGERY Right   . TONSILLECTOMY AND ADENOIDECTOMY       SOCIAL HISTORY:  Social History   Socioeconomic History  . Marital status: Divorced    Spouse name: Not on file  . Number of children: Not on file  . Years of education: Not on file  . Highest education level: Not on file  Occupational History  . Not on file  Social Needs  . Financial resource strain: Not on file  . Food insecurity:    Worry: Not on file    Inability: Not on file  . Transportation needs:    Medical: Not on file    Non-medical: Not on file  Tobacco Use  . Smoking status: Never Smoker  . Smokeless tobacco: Never Used  Substance and Sexual Activity  . Alcohol use: No  . Drug use: No  . Sexual activity: Not Currently    Birth control/protection: None  Lifestyle  . Physical activity:    Days per week: Not on file  Minutes per session: Not on file  . Stress: Not on file  Relationships  . Social connections:    Talks on phone: Not on file    Gets together: Not on file    Attends religious service: Not on file    Active member of club or organization: Not on file    Attends meetings of clubs or organizations: Not on file    Relationship status: Not on file  . Intimate partner violence:    Fear of current or ex partner: Not on file    Emotionally abused: Not on file    Physically abused: Not on file    Forced sexual activity: Not on file  Other Topics Concern  . Not on file  Social History Narrative  . Not on file    FAMILY HISTORY:  Family History  Problem Relation Age of Onset  . Hypertension Mother   . Heart attack Mother   . Heart  attack Father   . Diabetes Brother     CURRENT MEDICATIONS:  Outpatient Encounter Medications as of 08/13/2017  Medication Sig  . acetaminophen (TYLENOL) 325 MG tablet Take 650 mg by mouth as needed.  Marland Kitchen allopurinol (ZYLOPRIM) 300 MG tablet Take 1 tablet (300 mg total) by mouth daily.  Marland Kitchen doxycycline (VIBRA-TABS) 100 MG tablet   . fluconazole (DIFLUCAN) 200 MG tablet   . hydroxyurea (HYDREA) 500 MG capsule Take 1 capsule (500 mg total) by mouth 2 (two) times daily. May take with food to minimize GI side effects.  Marland Kitchen levofloxacin (LEVAQUIN) 500 MG tablet   . levothyroxine (SYNTHROID, LEVOTHROID) 112 MCG tablet Take 1 tablet (112 mcg total) by mouth daily.  . ondansetron (ZOFRAN) 8 MG tablet Take by mouth.  . pantoprazole (PROTONIX) 40 MG tablet   . traMADol (ULTRAM) 50 MG tablet   . VENCLEXTA 100 MG TABS    Facility-Administered Encounter Medications as of 08/13/2017  Medication  . 0.9 %  sodium chloride infusion  . acetaminophen (TYLENOL) tablet 650 mg  . diphenhydrAMINE (BENADRYL) capsule 25 mg  . sodium chloride flush (NS) 0.9 % injection 10 mL  . sodium chloride flush (NS) 0.9 % injection 10 mL  . sodium chloride flush (NS) 0.9 % injection 10 mL    ALLERGIES:  No Known Allergies   PHYSICAL EXAM:  ECOG Performance status: 1 I have reviewed his vital signs. Physical Exam  Constitutional: He is oriented to person, place, and time. He appears well-developed and well-nourished.  HENT:  Mouth/Throat: Oropharynx is clear and moist.  Cardiovascular: Regular rhythm and normal heart sounds.  Pulmonary/Chest: Effort normal and breath sounds normal.  Neurological: He is alert and oriented to person, place, and time.  Skin: Skin is warm.     LABORATORY DATA:  I have reviewed the labs as listed.  CBC    Component Value Date/Time   WBC 1.3 (LL) 08/13/2017 0919   RBC 2.76 (L) 08/13/2017 0919   HGB 8.1 (L) 08/13/2017 0919   HGB 6.7 (LL) 06/23/2016 0825   HCT 24.9 (L) 08/13/2017  0919   HCT 20.1 (L) 06/23/2016 0825   PLT 17 (LL) 08/13/2017 0919   PLT 182 06/23/2016 0825   MCV 90.2 08/13/2017 0919   MCV 106 (H) 06/23/2016 0825   MCH 29.3 08/13/2017 0919   MCHC 32.5 08/13/2017 0919   RDW 15.5 08/13/2017 0919   RDW 16.4 (H) 06/23/2016 0825   LYMPHSABS 0.6 (L) 08/13/2017 0919   LYMPHSABS 0.9 06/23/2016 0825   MONOABS 0.4  08/13/2017 0919   EOSABS 0.0 08/13/2017 0919   EOSABS 0.0 06/23/2016 0825   BASOSABS 0.0 08/13/2017 0919   BASOSABS 0.0 06/23/2016 0825   CMP Latest Ref Rng & Units 08/13/2017 08/09/2017 07/09/2017  Glucose 65 - 99 mg/dL 107(H) 99 112(H)  BUN 6 - 20 mg/dL 23(H) 26(H) 26(H)  Creatinine 0.61 - 1.24 mg/dL 1.02 1.27(H) 1.18  Sodium 135 - 145 mmol/L 137 139 138  Potassium 3.5 - 5.1 mmol/L 3.8 4.2 3.9  Chloride 101 - 111 mmol/L 105 105 104  CO2 22 - 32 mmol/L _0 Calcium 8.9 - 10.3 mg/dL 9.0 9.3 8.7(L)  Total Protein 6.5 - 8.1 g/dL 6.3(L) 6.7 6.6  Total Bilirubin 0.3 - 1.2 mg/dL 1.0 1.2 0.6  Alkaline Phos 38 - 126 U/L 65 75 58  AST 15 - 41 U/L 23 25 40  ALT 17 - 63 U/L 35 35 60        ASSESSMENT & PLAN:   AML (acute myeloblastic leukemia) ([facility]) 1.  AML with myelodysplasia related changes:  - Cycle 1 of decitabine 20 mg/m2 IV x5 days, along with venetoclax 400 mg daily started on 07/13/2017 - Most recent bone marrow on 08/07/2017 shows decrease in bone marrow blasts to around 36%.  Initial biopsy showed 86% blasts. - I have reviewed his blood counts today.  He will proceed with cycle 2 without any dose modifications.  He will receive platelet transfusion.  We will monitor his blood counts on a weekly basis.  We will transfuse if hemoglobin is less than 8 or platelet count less than 20.  He does not have any active bleeding at this time.  He also follows up with Dr. Florene Glen at [facility].  I will see him back again in 4 weeks for follow-up.  2.  Access: He has a PICC line in the right forearm placed at [facility].  3.  ID: There is no indication for prophylactic antibiotics at this time.      Orders placed this encounter:  No orders of the defined types were placed in this encounter.     Derek Jack, MD Media [phone]

## 2017-08-13 NOTE — Assessment & Plan Note (Addendum)
1.  AML with myelodysplasia related changes:  - Cycle 1 of decitabine 20 mg/m2 IV x5 days, along with venetoclax 400 mg daily started on 07/13/2017 - Most recent bone marrow on 08/07/2017 shows decrease in bone marrow blasts to around 36%.  Initial biopsy showed 86% blasts. - I have reviewed his blood counts today.  He will proceed with cycle 2 without any dose modifications.  He will receive platelet transfusion.  We will monitor his blood counts on a weekly basis.  We will transfuse if hemoglobin is less than 8 or platelet count less than 20.  He does not have any active bleeding at this time.  He also follows up with Dr. Florene Glen at [facility].  I will see him back again in 4 weeks for follow-up.  2.  Access: He has a PICC line in the right forearm placed at [facility].  3.  ID: There is no indication for prophylactic antibiotics at this time.

## 2017-08-13 NOTE — Progress Notes (Signed)
CRITICAL VALUE ALERT Critical value received:  WBC 1.3, Platelet count:17 Date of notification:  08-13-2017 Time of notification: 1010 Critical value read back:  Yes.   Nurse who received alert:  C.Page RN MD notified (1st page): Dr. Delton Coombes

## 2017-08-13 NOTE — Patient Instructions (Signed)
[facility] Discharge Instructions for Patients Receiving Chemotherapy   Beginning January 23rd 2017 lab work for the Cancer Center will be done in the  Main lab at [place] on 1st floor. If you have a lab appointment with the Cancer Center please come in thru the  Main Entrance and check in at the main information desk   Today you received the following chemotherapy agents Decitabine. Follow-up as scheduled. Call clinic for any questions or concerns  To help prevent nausea and vomiting after your treatment, we encourage you to take your nausea medication   If you develop nausea and vomiting, or diarrhea that is not controlled by your medication, call the clinic.  The clinic phone number is [phone]. Office hours are Monday-Friday 8:30am-5:00pm.  BELOW ARE SYMPTOMS THAT SHOULD BE REPORTED IMMEDIATELY:  *FEVER GREATER THAN 101.0 F  *CHILLS WITH OR WITHOUT FEVER  NAUSEA AND VOMITING THAT IS NOT CONTROLLED WITH YOUR NAUSEA MEDICATION  *UNUSUAL SHORTNESS OF BREATH  *UNUSUAL BRUISING OR BLEEDING  TENDERNESS IN MOUTH AND THROAT WITH OR WITHOUT PRESENCE OF ULCERS  *URINARY PROBLEMS  *BOWEL PROBLEMS  UNUSUAL RASH Items with * indicate a potential emergency and should be followed up as soon as possible. If you have an emergency after office hours please contact your primary care physician or go to the nearest emergency department.  Please call the clinic during office hours if you have any questions or concerns.   You may also contact the Patient Navigator at [phone] should you have any questions or need assistance in obtaining follow up care.      Resources For Cancer Patients and their Caregivers ? American Cancer Society: Can assist with transportation, wigs, general needs, runs Look Good Feel Better.        1-888-227-6333 ? Cancer Care: Provides financial assistance, online support groups, medication/co-pay assistance.  1-800-813-HOPE  (4673) ? [facility] Assists [place] cancer patients and their families through emotional , educational and financial support.  336-427-4357 ? [place] DSS Where to apply for food stamps, Medicaid and utility assistance. 336-342-1394 ? RCATS: Transportation to medical appointments. 336-347-2287 ? Social Security Administration: May apply for disability if have a Stage IV cancer. 336-342-7796 1-800-772-1213 ? [place] Aging, Disability and Transit Services: Assists with nutrition, care and transit needs. 336-349-2343         

## 2017-08-13 NOTE — Progress Notes (Signed)
START OFF PATHWAY REGIMEN - Other Dx   [id number]:Decitabine:   A cycle is every 28 days:     Decitabine   **Always confirm dose/schedule in your pharmacy ordering system**  Patient Characteristics: Intent of Therapy: Non-Curative / Palliative Intent, Discussed with Patient 

## 2017-08-13 NOTE — Progress Notes (Signed)
Omar Lee tolerated Decitabine infusion well without complaints or incident. Labs reviewed with and pt seen by Dr. Raliegh Ip today and pt approved for chemo tx along with 1 unit of platelets and 1 unit of blood ordered for tomorrow per [facility] orders. VSS upon discharge, Pt discharged self ambulatory in satisfactory condition

## 2017-08-13 NOTE — Patient Instructions (Signed)
[facility] at [facility] Discharge Instructions  Today you saw Dr. K.   Thank you for choosing [facility] at [facility] to provide your oncology and hematology care.  To afford each patient quality time with our provider, please arrive at least 15 minutes before your scheduled appointment time.   If you have a lab appointment with the Cancer Center please come in thru the  Main Entrance and check in at the main information desk  You need to re-schedule your appointment should you arrive 10 or more minutes late.  We strive to give you quality time with our providers, and arriving late affects you and other patients whose appointments are after yours.  Also, if you no show three or more times for appointments you may be dismissed from the clinic at the providers discretion.     Again, thank you for choosing [facility].  Our hope is that these requests will decrease the amount of time that you wait before being seen by our physicians.       _____________________________________________________________  Should you have questions after your visit to [facility], please contact our office at [phone] between the hours of 8:30 a.m. and 4:30 p.m.  Voicemails left after 4:30 p.m. will not be returned until the following business day.  For prescription refill requests, have your pharmacy contact our office.       Resources For Cancer Patients and their Caregivers ? American Cancer Society: Can assist with transportation, wigs, general needs, runs Look Good Feel Better.        1-888-227-6333 ? Cancer Care: Provides financial assistance, online support groups, medication/co-pay assistance.  1-800-813-HOPE (4673) ? [facility] Assists [place] cancer patients and their families through emotional , educational and financial support.  336-427-4357 ? [place] DSS Where to apply for food  stamps, Medicaid and utility assistance. 336-342-1394 ? RCATS: Transportation to medical appointments. 336-347-2287 ? Social Security Administration: May apply for disability if have a Stage IV cancer. 336-342-7796 1-800-772-1213 ? [place] Aging, Disability and Transit Services: Assists with nutrition, care and transit needs. 336-349-2343  Cancer Center Support Programs:   > Cancer Support Group  2nd Tuesday of the month 1pm-2pm, Journey Room   > Creative Journey  3rd Tuesday of the month 1130am-1pm, Journey Room    

## 2017-08-14 ENCOUNTER — Encounter: Payer: Self-pay | Admitting: General Practice

## 2017-08-14 ENCOUNTER — Inpatient Hospital Stay (HOSPITAL_COMMUNITY): Payer: Medicare HMO

## 2017-08-14 ENCOUNTER — Encounter (HOSPITAL_COMMUNITY): Payer: Self-pay

## 2017-08-14 VITALS — BP 113/58 | HR 64 | Temp 97.5°F | Resp 18

## 2017-08-14 DIAGNOSIS — D464 Refractory anemia, unspecified: Secondary | ICD-10-CM

## 2017-08-14 DIAGNOSIS — C9200 Acute myeloblastic leukemia, not having achieved remission: Secondary | ICD-10-CM

## 2017-08-14 DIAGNOSIS — Z5111 Encounter for antineoplastic chemotherapy: Secondary | ICD-10-CM | POA: Diagnosis not present

## 2017-08-14 MED ORDER — HEPARIN SOD (PORK) LOCK FLUSH 100 UNIT/ML IV SOLN
INTRAVENOUS | Status: AC
  Filled 2017-08-14: qty 5

## 2017-08-14 MED ORDER — DIPHENHYDRAMINE HCL 25 MG PO CAPS
25.0000 mg | ORAL_CAPSULE | Freq: Once | ORAL | Status: AC
  Administered 2017-08-14: 25 mg via ORAL
  Filled 2017-08-14: qty 1

## 2017-08-14 MED ORDER — SODIUM CHLORIDE 0.9% FLUSH
3.0000 mL | INTRAVENOUS | Status: AC | PRN
  Administered 2017-08-14: 3 mL

## 2017-08-14 MED ORDER — PROCHLORPERAZINE MALEATE 10 MG PO TABS
ORAL_TABLET | ORAL | Status: AC
  Filled 2017-08-14: qty 1

## 2017-08-14 MED ORDER — SODIUM CHLORIDE 0.9 % IV SOLN: 250.0000 mL | Freq: Once | INTRAVENOUS | Status: DC

## 2017-08-14 MED ORDER — SODIUM CHLORIDE 0.9 % IV SOLN
Freq: Once | INTRAVENOUS | Status: AC
  Administered 2017-08-14: 09:00:00 via INTRAVENOUS

## 2017-08-14 MED ORDER — SODIUM CHLORIDE 0.9 % IV SOLN
20.0000 mg/m2 | Freq: Once | INTRAVENOUS | Status: AC
  Administered 2017-08-14: 40 mg via INTRAVENOUS
  Filled 2017-08-14: qty 8

## 2017-08-14 MED ORDER — ACETAMINOPHEN 325 MG PO TABS
650.0000 mg | ORAL_TABLET | Freq: Once | ORAL | Status: AC
  Administered 2017-08-14: 650 mg via ORAL
  Filled 2017-08-14: qty 2

## 2017-08-14 MED ORDER — PROCHLORPERAZINE MALEATE 10 MG PO TABS
10.0000 mg | ORAL_TABLET | Freq: Once | ORAL | Status: AC
  Administered 2017-08-14: 10 mg via ORAL

## 2017-08-14 MED ORDER — HEPARIN SOD (PORK) LOCK FLUSH 100 UNIT/ML IV SOLN
250.0000 [IU] | INTRAVENOUS | Status: AC | PRN
  Administered 2017-08-14: 250 [IU]

## 2017-08-14 NOTE — Progress Notes (Signed)
[facility] CSW Progress Notes  Check in visit w patient while receiving infusion to assess for psychosocial needs.  Patient is from [place], moved to local area after retirement.  Adult children live out of state as do grandchildren.  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  Distressed at current physical deconditioning which means that he cannot continue these activities which he enjoyed.  Volunteer activities provided both physical activity and social connection.  CSW empathized with feelings of loss associated w cancer diagnosis and treatment.  Reviewed other options for connection including Support Group at [facility] and at [employer].  Provided May calendar of events.  Edwyna Shell, LCSW Clinical Social Worker Phone:  [phone]

## 2017-08-14 NOTE — Progress Notes (Signed)
To treatment area for chemotherapy, blood, and platelets.  No complaints of diarrhea, SOB, rash or itching.  Fair energy level.  No complaints of neuropathy.  Able to perform ADL's with no difficulty.  No s/s of distress noted.   Patient tolerated blood transfusion, platelets, and decitabine with no complaints voiced.  PICC line flushed per protocol.  No complaints with flush and good blood return noted with both lines.  VSS with discharge and left ambulatory with no s/s of distress noted.

## 2017-08-14 NOTE — Patient Instructions (Signed)
[place] Discharge Instructions for Patients Receiving Chemotherapy  Today you received the following chemotherapy agents decitabine.  You also received a blood and platelet transfusion today.   If you develop nausea and vomiting that is not controlled by your nausea medication, call the clinic.   BELOW ARE SYMPTOMS THAT SHOULD BE REPORTED IMMEDIATELY:  *FEVER GREATER THAN 100.5 F  *CHILLS WITH OR WITHOUT FEVER  NAUSEA AND VOMITING THAT IS NOT CONTROLLED WITH YOUR NAUSEA MEDICATION  *UNUSUAL SHORTNESS OF BREATH  *UNUSUAL BRUISING OR BLEEDING  TENDERNESS IN MOUTH AND THROAT WITH OR WITHOUT PRESENCE OF ULCERS  *URINARY PROBLEMS  *BOWEL PROBLEMS  UNUSUAL RASH Items with * indicate a potential emergency and should be followed up as soon as possible.  Feel free to call the clinic should you have any questions or concerns. The clinic phone number is [phone].  Please show the [place] at check-in to the Emergency Department and triage nurse.

## 2017-08-15 ENCOUNTER — Inpatient Hospital Stay (HOSPITAL_COMMUNITY): Payer: Medicare HMO | Attending: Oncology

## 2017-08-15 VITALS — BP 142/82 | HR 76 | Temp 98.8°F | Resp 18

## 2017-08-15 DIAGNOSIS — D4621 Refractory anemia with excess of blasts 1: Secondary | ICD-10-CM | POA: Diagnosis not present

## 2017-08-15 DIAGNOSIS — C9200 Acute myeloblastic leukemia, not having achieved remission: Secondary | ICD-10-CM

## 2017-08-15 DIAGNOSIS — Z5111 Encounter for antineoplastic chemotherapy: Secondary | ICD-10-CM | POA: Diagnosis not present

## 2017-08-15 LAB — TYPE AND SCREEN
ABO/RH(D): A POS
Antibody Screen: NEGATIVE
Unit division: 0

## 2017-08-15 LAB — BPAM PLATELET PHERESIS
Blood Product Expiration Date: 201905010526
ISSUE DATE / TIME: 201904301148
UNIT TYPE AND RH: 6200

## 2017-08-15 LAB — PREPARE PLATELET PHERESIS: UNIT DIVISION: 0

## 2017-08-15 LAB — BPAM RBC
BLOOD PRODUCT EXPIRATION DATE: 201905162359
ISSUE DATE / TIME: 201904300931
Unit Type and Rh: 6200

## 2017-08-15 MED ORDER — SODIUM CHLORIDE 0.9% FLUSH
10.0000 mL | INTRAVENOUS | Status: DC | PRN
  Administered 2017-08-15: 10 mL
  Filled 2017-08-15: qty 10

## 2017-08-15 MED ORDER — PROCHLORPERAZINE MALEATE 10 MG PO TABS
10.0000 mg | ORAL_TABLET | Freq: Once | ORAL | Status: AC
  Administered 2017-08-15: 10 mg via ORAL

## 2017-08-15 MED ORDER — HEPARIN SOD (PORK) LOCK FLUSH 100 UNIT/ML IV SOLN
INTRAVENOUS | Status: AC
  Filled 2017-08-15: qty 5

## 2017-08-15 MED ORDER — HEPARIN SOD (PORK) LOCK FLUSH 100 UNIT/ML IV SOLN
250.0000 [IU] | Freq: Once | INTRAVENOUS | Status: AC | PRN
  Administered 2017-08-15: 250 [IU]

## 2017-08-15 MED ORDER — SODIUM CHLORIDE 0.9 % IV SOLN
20.0000 mg/m2 | Freq: Once | INTRAVENOUS | Status: AC
  Administered 2017-08-15: 40 mg via INTRAVENOUS
  Filled 2017-08-15: qty 8

## 2017-08-15 MED ORDER — PROCHLORPERAZINE MALEATE 10 MG PO TABS
ORAL_TABLET | ORAL | Status: AC
  Filled 2017-08-15: qty 1

## 2017-08-15 MED ORDER — SODIUM CHLORIDE 0.9 % IV SOLN
Freq: Once | INTRAVENOUS | Status: AC
  Administered 2017-08-15: 14:00:00 via INTRAVENOUS

## 2017-08-15 NOTE — Progress Notes (Signed)
Tolerated infusion w/o adverse reaction.  Alert, in no distress.  VSS.  Discharged ambulatory.  

## 2017-08-16 ENCOUNTER — Inpatient Hospital Stay (HOSPITAL_COMMUNITY): Payer: Medicare HMO

## 2017-08-16 ENCOUNTER — Encounter (HOSPITAL_COMMUNITY): Payer: Medicare HMO

## 2017-08-16 VITALS — BP 117/56 | HR 65 | Temp 97.9°F | Resp 18 | Wt 181.8 lb

## 2017-08-16 DIAGNOSIS — Z5111 Encounter for antineoplastic chemotherapy: Secondary | ICD-10-CM | POA: Diagnosis not present

## 2017-08-16 DIAGNOSIS — C9200 Acute myeloblastic leukemia, not having achieved remission: Secondary | ICD-10-CM

## 2017-08-16 DIAGNOSIS — D464 Refractory anemia, unspecified: Secondary | ICD-10-CM

## 2017-08-16 LAB — PREPARE RBC (CROSSMATCH)

## 2017-08-16 LAB — COMPREHENSIVE METABOLIC PANEL
ALT: 34 U/L (ref 17–63)
AST: 26 U/L (ref 15–41)
Albumin: 3.9 g/dL (ref 3.5–5.0)
Alkaline Phosphatase: 69 U/L (ref 38–126)
Anion gap: 9 (ref 5–15)
BILIRUBIN TOTAL: 1.1 mg/dL (ref 0.3–1.2)
BUN: 20 mg/dL (ref 6–20)
CALCIUM: 8.9 mg/dL (ref 8.9–10.3)
CO2: 23 mmol/L (ref 22–32)
CREATININE: 0.99 mg/dL (ref 0.61–1.24)
Chloride: 104 mmol/L (ref 101–111)
GFR calc Af Amer: >60 mL/min (ref >60)
Glucose, Bld: 95 mg/dL (ref 65–99)
Potassium: 4.1 mmol/L (ref 3.5–5.1)
Sodium: 136 mmol/L (ref 135–145)
TOTAL PROTEIN: 6.2 g/dL — AB (ref 6.5–8.1)

## 2017-08-16 LAB — CBC WITH DIFFERENTIAL/PLATELET
BASOS ABS: 0 10*3/uL (ref 0.0–0.1)
Basophils Relative: 0 %
EOS ABS: 0 10*3/uL (ref 0.0–0.7)
EOS PCT: 0 %
HCT: 25.1 % — ABNORMAL LOW (ref 39.0–52.0)
Hemoglobin: 8.5 g/dL — ABNORMAL LOW (ref 13.0–17.0)
Lymphocytes Relative: 50 %
Lymphs Abs: 0.4 10*3/uL (ref 0.7–4.0)
MCH: 30 pg (ref 26.0–34.0)
MCHC: 33.9 g/dL (ref 30.0–36.0)
MCV: 88.7 fL (ref 78.0–100.0)
Monocytes Absolute: 0.2 10*3/uL (ref 0.1–1.0)
Monocytes Relative: 27 %
Neutro Abs: 0.2 10*3/uL (ref 1.7–7.7)
Neutrophils Relative %: 23 %
PLATELETS: 24 10*3/uL — AB (ref 150–400)
RBC: 2.83 MIL/uL — AB (ref 4.22–5.81)
RDW: 15.1 % (ref 11.5–15.5)
WBC: 0.8 10*3/uL — AB (ref 4.0–10.5)

## 2017-08-16 LAB — SAMPLE TO BLOOD BANK

## 2017-08-16 MED ORDER — SODIUM CHLORIDE 0.9 % IV SOLN
20.0000 mg/m2 | Freq: Once | INTRAVENOUS | Status: AC
  Administered 2017-08-16: 40 mg via INTRAVENOUS
  Filled 2017-08-16: qty 8

## 2017-08-16 MED ORDER — ACETAMINOPHEN 325 MG PO TABS
650.0000 mg | ORAL_TABLET | Freq: Once | ORAL | Status: AC
  Administered 2017-08-16: 650 mg via ORAL
  Filled 2017-08-16: qty 2

## 2017-08-16 MED ORDER — PROCHLORPERAZINE MALEATE 10 MG PO TABS
10.0000 mg | ORAL_TABLET | Freq: Once | ORAL | Status: AC
  Administered 2017-08-16: 10 mg via ORAL

## 2017-08-16 MED ORDER — SODIUM CHLORIDE 0.9% FLUSH
10.0000 mL | INTRAVENOUS | Status: DC | PRN
  Administered 2017-08-16: 10 mL
  Filled 2017-08-16: qty 10

## 2017-08-16 MED ORDER — SODIUM CHLORIDE 0.9 % IV SOLN
Freq: Once | INTRAVENOUS | Status: AC
  Administered 2017-08-16: 10:00:00 via INTRAVENOUS

## 2017-08-16 MED ORDER — PROCHLORPERAZINE MALEATE 10 MG PO TABS
ORAL_TABLET | ORAL | Status: AC
  Filled 2017-08-16: qty 1

## 2017-08-16 MED ORDER — SODIUM CHLORIDE 0.9 % IV SOLN
250.0000 mL | Freq: Once | INTRAVENOUS | Status: AC
  Administered 2017-08-16: 250 mL via INTRAVENOUS

## 2017-08-16 MED ORDER — DIPHENHYDRAMINE HCL 25 MG PO CAPS
25.0000 mg | ORAL_CAPSULE | Freq: Once | ORAL | Status: AC
  Administered 2017-08-16: 25 mg via ORAL
  Filled 2017-08-16: qty 1

## 2017-08-16 MED ORDER — HEPARIN SOD (PORK) LOCK FLUSH 100 UNIT/ML IV SOLN
250.0000 [IU] | Freq: Once | INTRAVENOUS | Status: DC | PRN
  Filled 2017-08-16: qty 5

## 2017-08-16 NOTE — Progress Notes (Signed)
CRITICAL VALUE ALERT Critical value received:  WBC-0.8 Date of notification:  08/16/17 Time of notification: 5449 Critical value read back:  Yes.   Nurse who received alert:  M.Talita Recht, LPN MD notified (1st page):  S.Katragadda, MD  CRITICAL VALUE ALERT Critical value received:  Platelets-24 Date of notification:  08/16/17 Time of notification: 2010 Critical value read back:  Yes.   Nurse who received alert:  M.Gitel Beste, LPN MD notified (1st page):  S.Katragadda, MD  CRITICAL VALUE ALERT Critical value received:  Hgb-8.5 Date of notification:  08/16/17 Time of notification: 0712 Critical value read back:  Yes.   Nurse who received alert:  M.Markus Casten,LPN MD notified (1st page):  S.Katragadda, MD  These results were also given to patients treatment room nurse.

## 2017-08-16 NOTE — Patient Instructions (Signed)
[facility] Discharge Instructions for Patients Receiving Chemotherapy   Beginning January 23rd 2017 lab work for the Cancer Center will be done in the  Main lab at [place] on 1st floor. If you have a lab appointment with the Cancer Center please come in thru the  Main Entrance and check in at the main information desk   Today you received the following chemotherapy agents   To help prevent nausea and vomiting after your treatment, we encourage you to take your nausea medication     If you develop nausea and vomiting, or diarrhea that is not controlled by your medication, call the clinic.  The clinic phone number is [phone]. Office hours are Monday-Friday 8:30am-5:00pm.  BELOW ARE SYMPTOMS THAT SHOULD BE REPORTED IMMEDIATELY:  *FEVER GREATER THAN 101.0 F  *CHILLS WITH OR WITHOUT FEVER  NAUSEA AND VOMITING THAT IS NOT CONTROLLED WITH YOUR NAUSEA MEDICATION  *UNUSUAL SHORTNESS OF BREATH  *UNUSUAL BRUISING OR BLEEDING  TENDERNESS IN MOUTH AND THROAT WITH OR WITHOUT PRESENCE OF ULCERS  *URINARY PROBLEMS  *BOWEL PROBLEMS  UNUSUAL RASH Items with * indicate a potential emergency and should be followed up as soon as possible. If you have an emergency after office hours please contact your primary care physician or go to the nearest emergency department.  Please call the clinic during office hours if you have any questions or concerns.   You may also contact the Patient Navigator at [phone] should you have any questions or need assistance in obtaining follow up care.      Resources For Cancer Patients and their Caregivers ? American Cancer Society: Can assist with transportation, wigs, general needs, runs Look Good Feel Better.        1-888-227-6333 ? Cancer Care: Provides financial assistance, online support groups, medication/co-pay assistance.  1-800-813-HOPE (4673) ? [facility] Assists [place] cancer  patients and their families through emotional , educational and financial support.  336-427-4357 ? [place] DSS Where to apply for food stamps, Medicaid and utility assistance. 336-342-1394 ? RCATS: Transportation to medical appointments. 336-347-2287 ? Social Security Administration: May apply for disability if have a Stage IV cancer. 336-342-7796 1-800-772-1213 ? [place] Aging, Disability and Transit Services: Assists with nutrition, care and transit needs. 336-349-2343         

## 2017-08-16 NOTE — Progress Notes (Signed)
Hemoglobin 8.5 today. Patient states he is really tired all the time. Will give blood per orders.   One unit of blood given today per protocol from [facility]  Treatment given per orders. Patient tolerated it well without problems. Vitals stable and discharged home from clinic ambulatory. Follow up as scheduled.

## 2017-08-17 ENCOUNTER — Encounter (HOSPITAL_COMMUNITY): Payer: Self-pay

## 2017-08-17 ENCOUNTER — Inpatient Hospital Stay (HOSPITAL_COMMUNITY): Payer: Medicare HMO

## 2017-08-17 VITALS — BP 149/76 | HR 74 | Temp 98.1°F | Resp 18

## 2017-08-17 DIAGNOSIS — Z5111 Encounter for antineoplastic chemotherapy: Secondary | ICD-10-CM | POA: Diagnosis not present

## 2017-08-17 DIAGNOSIS — C9200 Acute myeloblastic leukemia, not having achieved remission: Secondary | ICD-10-CM

## 2017-08-17 LAB — BPAM RBC
BLOOD PRODUCT EXPIRATION DATE: 201905212359
ISSUE DATE / TIME: 201905021227
Unit Type and Rh: 5100

## 2017-08-17 LAB — TYPE AND SCREEN
ABO/RH(D): A POS
Antibody Screen: NEGATIVE
UNIT DIVISION: 0

## 2017-08-17 MED ORDER — SODIUM CHLORIDE 0.9% FLUSH
10.0000 mL | INTRAVENOUS | Status: DC | PRN
  Administered 2017-08-17: 10 mL
  Filled 2017-08-17: qty 10

## 2017-08-17 MED ORDER — PROCHLORPERAZINE MALEATE 10 MG PO TABS
10.0000 mg | ORAL_TABLET | Freq: Once | ORAL | Status: AC
  Administered 2017-08-17: 10 mg via ORAL

## 2017-08-17 MED ORDER — SODIUM CHLORIDE 0.9 % IV SOLN
20.0000 mg/m2 | Freq: Once | INTRAVENOUS | Status: AC
  Administered 2017-08-17: 40 mg via INTRAVENOUS
  Filled 2017-08-17: qty 8

## 2017-08-17 MED ORDER — SODIUM CHLORIDE 0.9 % IV SOLN
Freq: Once | INTRAVENOUS | Status: AC
  Administered 2017-08-17: 10:00:00 via INTRAVENOUS

## 2017-08-17 MED ORDER — HEPARIN SOD (PORK) LOCK FLUSH 100 UNIT/ML IV SOLN
500.0000 [IU] | Freq: Once | INTRAVENOUS | Status: AC | PRN
  Administered 2017-08-17: 500 [IU]

## 2017-08-17 MED ORDER — PROCHLORPERAZINE MALEATE 10 MG PO TABS
ORAL_TABLET | ORAL | Status: AC
  Filled 2017-08-17: qty 1

## 2017-08-17 NOTE — Progress Notes (Signed)
To treatment room for chemotherapy.  Fatigue but no worsening.  Able to perform ADL's without difficulty.  Fair appetite with no nausea, rashes, or itching.  No s/s of distress noted.   Patient tolerated chemotherapy with no complaints voiced.  PICC line flushed with good blood return noted before and after administration.  No complaints of pain with flush.  No tenderness or redness noted with insertion site.  VSS with discharge and left ambulatory with no s/s of distress noted.

## 2017-08-17 NOTE — Patient Instructions (Signed)
[place] Discharge Instructions for Patients Receiving Chemotherapy  Today you received the following chemotherapy agents decitabine.    If you develop nausea and vomiting that is not controlled by your nausea medication, call the clinic.   BELOW ARE SYMPTOMS THAT SHOULD BE REPORTED IMMEDIATELY:  *FEVER GREATER THAN 100.5 F  *CHILLS WITH OR WITHOUT FEVER  NAUSEA AND VOMITING THAT IS NOT CONTROLLED WITH YOUR NAUSEA MEDICATION  *UNUSUAL SHORTNESS OF BREATH  *UNUSUAL BRUISING OR BLEEDING  TENDERNESS IN MOUTH AND THROAT WITH OR WITHOUT PRESENCE OF ULCERS  *URINARY PROBLEMS  *BOWEL PROBLEMS  UNUSUAL RASH Items with * indicate a potential emergency and should be followed up as soon as possible.  Feel free to call the clinic should you have any questions or concerns. The clinic phone number is [phone].  Please show the [place] at check-in to the Emergency Department and triage nurse.

## 2017-08-20 ENCOUNTER — Inpatient Hospital Stay (HOSPITAL_COMMUNITY): Payer: Medicare HMO

## 2017-08-20 VITALS — BP 130/56 | HR 66 | Temp 97.9°F | Resp 16

## 2017-08-20 DIAGNOSIS — Z9289 Personal history of other medical treatment: Secondary | ICD-10-CM

## 2017-08-20 DIAGNOSIS — D469 Myelodysplastic syndrome, unspecified: Secondary | ICD-10-CM

## 2017-08-20 DIAGNOSIS — C9200 Acute myeloblastic leukemia, not having achieved remission: Secondary | ICD-10-CM

## 2017-08-20 DIAGNOSIS — Z5111 Encounter for antineoplastic chemotherapy: Secondary | ICD-10-CM | POA: Diagnosis not present

## 2017-08-20 LAB — CBC WITH DIFFERENTIAL/PLATELET
BASOS PCT: 0 %
Basophils Absolute: 0 10*3/uL (ref 0.0–0.1)
EOS PCT: 0 %
Eosinophils Absolute: 0 10*3/uL (ref 0.0–0.7)
HEMATOCRIT: 26.9 % — AB (ref 39.0–52.0)
Hemoglobin: 9.1 g/dL — ABNORMAL LOW (ref 13.0–17.0)
Lymphocytes Relative: 69 %
Lymphs Abs: 0.4 10*3/uL (ref 0.7–4.0)
MCH: 29.5 pg (ref 26.0–34.0)
MCHC: 33.8 g/dL (ref 30.0–36.0)
MCV: 87.3 fL (ref 78.0–100.0)
MONO ABS: 0.1 10*3/uL (ref 0.1–1.0)
MONOS PCT: 15 %
NEUTROS ABS: 0.1 10*3/uL (ref 1.7–7.7)
Neutrophils Relative %: 16 %
Platelets: <5 10*3/uL — CL (ref 150–400)
RBC: 3.08 MIL/uL — ABNORMAL LOW (ref 4.22–5.81)
RDW: 14.6 % (ref 11.5–15.5)
WBC: 0.6 10*3/uL — CL (ref 4.0–10.5)

## 2017-08-20 LAB — COMPREHENSIVE METABOLIC PANEL
ALBUMIN: 4.1 g/dL (ref 3.5–5.0)
ALT: 57 U/L (ref 17–63)
ANION GAP: 10 (ref 5–15)
AST: 38 U/L (ref 15–41)
Alkaline Phosphatase: 82 U/L (ref 38–126)
BUN: 26 mg/dL — ABNORMAL HIGH (ref 6–20)
CO2: 22 mmol/L (ref 22–32)
Calcium: 8.9 mg/dL (ref 8.9–10.3)
Chloride: 103 mmol/L (ref 101–111)
Creatinine, Ser: 1.04 mg/dL (ref 0.61–1.24)
GFR calc Af Amer: >60 mL/min (ref >60)
Glucose, Bld: 146 mg/dL — ABNORMAL HIGH (ref 65–99)
POTASSIUM: 3.8 mmol/L (ref 3.5–5.1)
Sodium: 135 mmol/L (ref 135–145)
Total Bilirubin: 1.1 mg/dL (ref 0.3–1.2)
Total Protein: 6.4 g/dL — ABNORMAL LOW (ref 6.5–8.1)

## 2017-08-20 LAB — SAMPLE TO BLOOD BANK

## 2017-08-20 LAB — MAGNESIUM: MAGNESIUM: 1.9 mg/dL (ref 1.7–2.4)

## 2017-08-20 MED ORDER — DIPHENHYDRAMINE HCL 25 MG PO CAPS
25.0000 mg | ORAL_CAPSULE | Freq: Once | ORAL | Status: AC
  Administered 2017-08-20: 25 mg via ORAL

## 2017-08-20 MED ORDER — ACETAMINOPHEN 325 MG PO TABS
ORAL_TABLET | ORAL | Status: AC
  Filled 2017-08-20: qty 2

## 2017-08-20 MED ORDER — ACETAMINOPHEN 325 MG PO TABS
650.0000 mg | ORAL_TABLET | Freq: Once | ORAL | Status: AC
  Administered 2017-08-20: 650 mg via ORAL

## 2017-08-20 MED ORDER — SODIUM CHLORIDE 0.9% FLUSH
10.0000 mL | Freq: Once | INTRAVENOUS | Status: AC
  Administered 2017-08-20: 10 mL via INTRAVENOUS

## 2017-08-20 MED ORDER — SODIUM CHLORIDE 0.9 % IV SOLN
250.0000 mL | Freq: Once | INTRAVENOUS | Status: AC
  Administered 2017-08-20: 250 mL via INTRAVENOUS

## 2017-08-20 MED ORDER — HEPARIN SOD (PORK) LOCK FLUSH 100 UNIT/ML IV SOLN
500.0000 [IU] | Freq: Once | INTRAVENOUS | Status: AC
  Administered 2017-08-20: 500 [IU] via INTRAVENOUS

## 2017-08-20 MED ORDER — DIPHENHYDRAMINE HCL 25 MG PO CAPS
ORAL_CAPSULE | ORAL | Status: AC
  Filled 2017-08-20: qty 2

## 2017-08-20 NOTE — Progress Notes (Unsigned)
1700-platelets started. MD aware.

## 2017-08-20 NOTE — Progress Notes (Unsigned)
Patient to receive 1 unit of platelets (regular) today. Patient to return on Thursday for CBC diff and a unit of platelets if platelets are low per Dr. Raliegh Ip.   CRITICAL VALUE ALERT Critical value received:  Platelets  < 5,000     WBC .6 Date of notification:  08/20/17 Time of notification: 7782 Critical value read back:  Yes.   Nurse who received alert:  Isidoro Donning RN Dr. Delton Coombes notified. He said to give 1 unit of platelets (regular) and that we would watch the white blood count.

## 2017-08-20 NOTE — Patient Instructions (Signed)
[place] at [facility] Discharge Instructions  One unit of platelets Follow up as scheduled   Thank you for choosing [place] at [facility] to provide your oncology and hematology care.  To afford each patient quality time with our provider, please arrive at least 15 minutes before your scheduled appointment time.   If you have a lab appointment with the [place] please come in thru the  Main Entrance and check in at the main information desk  You need to re-schedule your appointment should you arrive 10 or more minutes late.  We strive to give you quality time with our providers, and arriving late affects you and other patients whose appointments are after yours.  Also, if you no show three or more times for appointments you may be dismissed from the clinic at the providers discretion.     Again, thank you for choosing [facility].  Our hope is that these requests will decrease the amount of time that you wait before being seen by our physicians.       _____________________________________________________________  Should you have questions after your visit to [facility], please contact our office at [phone] between the hours of 8:30 a.m. and 4:30 p.m.  Voicemails left after 4:30 p.m. will not be returned until the following business day.  For prescription refill requests, have your pharmacy contact our office.       Resources For Cancer Patients and their Caregivers ? American Cancer Society: Can assist with transportation, wigs, general needs, runs Look Good Feel Better.        408-139-2698 ? Cancer Care: Provides financial assistance, online support groups, medication/co-pay assistance.  1-800-813-HOPE (838)885-0641) ? [place] Assists [place] cancer patients and their families through emotional , educational and financial support.  (873) 696-0426 ? [place]  DSS Where to apply for food stamps, Medicaid and utility assistance. 616-542-2750 ? RCATS: Transportation to medical appointments. 931-872-7462 ? Social Security Administration: May apply for disability if have a Stage IV cancer. 386-187-9099 8634213179 ? [employer], Disability and Transit Services: Assists with nutrition, care and transit needs. [place] Support Programs:   > Cancer Support Group  2nd Tuesday of the month 1pm-2pm, Journey Room   > Creative Journey  3rd Tuesday of the month 1130am-1pm, Journey Room

## 2017-08-21 LAB — PREPARE PLATELET PHERESIS: Unit division: 0

## 2017-08-21 LAB — BPAM PLATELET PHERESIS
BLOOD PRODUCT EXPIRATION DATE: 201905072359
ISSUE DATE / TIME: 201905061650
Unit Type and Rh: 6200

## 2017-08-23 ENCOUNTER — Encounter (HOSPITAL_COMMUNITY): Payer: Self-pay

## 2017-08-23 ENCOUNTER — Inpatient Hospital Stay (HOSPITAL_COMMUNITY): Payer: Medicare HMO

## 2017-08-23 DIAGNOSIS — C9200 Acute myeloblastic leukemia, not having achieved remission: Secondary | ICD-10-CM

## 2017-08-23 DIAGNOSIS — Z5111 Encounter for antineoplastic chemotherapy: Secondary | ICD-10-CM | POA: Diagnosis not present

## 2017-08-23 LAB — COMPREHENSIVE METABOLIC PANEL
ALBUMIN: 3.7 g/dL (ref 3.5–5.0)
ALK PHOS: 81 U/L (ref 38–126)
ALT: 60 U/L (ref 17–63)
ANION GAP: 8 (ref 5–15)
AST: 37 U/L (ref 15–41)
BILIRUBIN TOTAL: 0.9 mg/dL (ref 0.3–1.2)
BUN: 21 mg/dL — ABNORMAL HIGH (ref 6–20)
CALCIUM: 8.8 mg/dL — AB (ref 8.9–10.3)
CO2: 24 mmol/L (ref 22–32)
Chloride: 104 mmol/L (ref 101–111)
Creatinine, Ser: 0.97 mg/dL (ref 0.61–1.24)
GFR calc non Af Amer: >60 mL/min (ref >60)
GLUCOSE: 120 mg/dL — AB (ref 65–99)
POTASSIUM: 4 mmol/L (ref 3.5–5.1)
Sodium: 136 mmol/L (ref 135–145)
Total Protein: 6.1 g/dL — ABNORMAL LOW (ref 6.5–8.1)

## 2017-08-23 LAB — CBC WITH DIFFERENTIAL/PLATELET
BASOS ABS: 0 10*3/uL (ref 0.0–0.1)
BASOS PCT: 0 %
Eosinophils Absolute: 0 10*3/uL (ref 0.0–0.7)
Eosinophils Relative: 0 %
HEMATOCRIT: 23.8 % — AB (ref 39.0–52.0)
HEMOGLOBIN: 8.1 g/dL — AB (ref 13.0–17.0)
LYMPHS PCT: 79 %
Lymphs Abs: 0.3 10*3/uL (ref 0.7–4.0)
MCH: 29.9 pg (ref 26.0–34.0)
MCHC: 34 g/dL (ref 30.0–36.0)
MCV: 87.8 fL (ref 78.0–100.0)
MONO ABS: 0 10*3/uL (ref 0.1–1.0)
Monocytes Relative: 12 %
NEUTROS ABS: 0 10*3/uL (ref 1.7–7.7)
NEUTROS PCT: 9 %
Platelets: 10 10*3/uL — CL (ref 150–400)
RBC: 2.71 MIL/uL — ABNORMAL LOW (ref 4.22–5.81)
RDW: 14.2 % (ref 11.5–15.5)
WBC: 0.3 10*3/uL — CL (ref 4.0–10.5)

## 2017-08-23 LAB — MAGNESIUM: Magnesium: 1.7 mg/dL (ref 1.7–2.4)

## 2017-08-23 LAB — SAMPLE TO BLOOD BANK

## 2017-08-23 LAB — PREPARE RBC (CROSSMATCH)

## 2017-08-23 MED ORDER — HEPARIN SOD (PORK) LOCK FLUSH 100 UNIT/ML IV SOLN
250.0000 [IU] | INTRAVENOUS | Status: AC | PRN
  Administered 2017-08-23: 250 [IU]

## 2017-08-23 MED ORDER — SODIUM CHLORIDE 0.9 % IV SOLN
250.0000 mL | Freq: Once | INTRAVENOUS | Status: AC
  Administered 2017-08-23: 250 mL via INTRAVENOUS

## 2017-08-23 MED ORDER — ACETAMINOPHEN 325 MG PO TABS
650.0000 mg | ORAL_TABLET | Freq: Once | ORAL | Status: AC
  Administered 2017-08-23: 650 mg via ORAL
  Filled 2017-08-23: qty 2

## 2017-08-23 MED ORDER — SODIUM CHLORIDE 0.9% FLUSH: 10.0000 mL | INTRAVENOUS | Status: DC | PRN

## 2017-08-23 MED ORDER — HEPARIN SOD (PORK) LOCK FLUSH 100 UNIT/ML IV SOLN: 250.0000 [IU] | INTRAVENOUS | Status: DC | PRN

## 2017-08-23 MED ORDER — SODIUM CHLORIDE 0.9% FLUSH
3.0000 mL | INTRAVENOUS | Status: AC | PRN
  Administered 2017-08-23: 3 mL

## 2017-08-23 MED ORDER — DIPHENHYDRAMINE HCL 25 MG PO CAPS
25.0000 mg | ORAL_CAPSULE | Freq: Once | ORAL | Status: AC
  Administered 2017-08-23: 25 mg via ORAL
  Filled 2017-08-23: qty 1

## 2017-08-23 NOTE — Progress Notes (Signed)
CRITICAL VALUE ALERT Critical value received:  WBC 0.3, platelets 10.0 Date of notification:  08-23-2017 Time of notification: 9528 Critical value read back:  Yes.   Nurse who received alert:  C.PageRN MD notified (1st page):  Dr. Delton Coombes

## 2017-08-23 NOTE — Progress Notes (Signed)
See other encounter.

## 2017-08-23 NOTE — Patient Instructions (Signed)
[place] at [facility] Discharge Instructions  Received 1 unit of PRBC's and 1 unit of Platelets today. Follow-up as scheduled. Call clinic for any questions or concerns   Thank you for choosing [place] at [facility] to provide your oncology and hematology care.  To afford each patient quality time with our provider, please arrive at least 15 minutes before your scheduled appointment time.   If you have a lab appointment with the [place] please come in thru the  Main Entrance and check in at the main information desk  You need to re-schedule your appointment should you arrive 10 or more minutes late.  We strive to give you quality time with our providers, and arriving late affects you and other patients whose appointments are after yours.  Also, if you no show three or more times for appointments you may be dismissed from the clinic at the providers discretion.     Again, thank you for choosing [facility].  Our hope is that these requests will decrease the amount of time that you wait before being seen by our physicians.       _____________________________________________________________  Should you have questions after your visit to [facility], please contact our office at [phone] between the hours of 8:30 a.m. and 4:30 p.m.  Voicemails left after 4:30 p.m. will not be returned until the following business day.  For prescription refill requests, have your pharmacy contact our office.       Resources For Cancer Patients and their Caregivers ? American Cancer Society: Can assist with transportation, wigs, general needs, runs Look Good Feel Better.        6614817240 ? Cancer Care: Provides financial assistance, online support groups, medication/co-pay assistance.  1-800-813-HOPE 682-241-7838) ? [place] Assists [place] cancer patients and their families through  emotional , educational and financial support.  701-125-6437 ? [place] DSS Where to apply for food stamps, Medicaid and utility assistance. 5391175485 ? RCATS: Transportation to medical appointments. 2131941657 ? Social Security Administration: May apply for disability if have a Stage IV cancer. 8024719668 708-888-5742 ? [employer], Disability and Transit Services: Assists with nutrition, care and transit needs. [place] Support Programs:   > Cancer Support Group  2nd Tuesday of the month 1pm-2pm, Journey Room   > Creative Journey  3rd Tuesday of the month 1130am-1pm, Journey Room

## 2017-08-23 NOTE — Progress Notes (Signed)
Omar Lee tolerated blood and platelet transfusions well without complaints or incident. VSS prior to,during and after both transfusions and upon discharge.PICC line dressing and caps changed after both lumens were flushed with NS and Heparin per protocol. Pt discharged self ambulatory in satisfactory condition

## 2017-08-24 LAB — BPAM PLATELET PHERESIS
Blood Product Expiration Date: 201905102359
ISSUE DATE / TIME: 201905091440
Unit Type and Rh: 6200

## 2017-08-24 LAB — BPAM RBC
BLOOD PRODUCT EXPIRATION DATE: 201906052359
ISSUE DATE / TIME: 201905091227
UNIT TYPE AND RH: 6200

## 2017-08-24 LAB — PREPARE PLATELET PHERESIS: UNIT DIVISION: 0

## 2017-08-24 LAB — TYPE AND SCREEN
ABO/RH(D): A POS
Antibody Screen: NEGATIVE
Unit division: 0

## 2017-08-27 ENCOUNTER — Encounter (HOSPITAL_COMMUNITY): Payer: Medicare HMO

## 2017-08-27 ENCOUNTER — Encounter (HOSPITAL_COMMUNITY): Payer: Self-pay

## 2017-08-27 ENCOUNTER — Inpatient Hospital Stay (HOSPITAL_COMMUNITY): Payer: Medicare HMO

## 2017-08-27 DIAGNOSIS — C9200 Acute myeloblastic leukemia, not having achieved remission: Secondary | ICD-10-CM

## 2017-08-27 DIAGNOSIS — Z5111 Encounter for antineoplastic chemotherapy: Secondary | ICD-10-CM | POA: Diagnosis not present

## 2017-08-27 LAB — COMPREHENSIVE METABOLIC PANEL
ALBUMIN: 4 g/dL (ref 3.5–5.0)
ALT: 75 U/L — ABNORMAL HIGH (ref 17–63)
ANION GAP: 7 (ref 5–15)
AST: 40 U/L (ref 15–41)
Alkaline Phosphatase: 99 U/L (ref 38–126)
BUN: 21 mg/dL — AB (ref 6–20)
CO2: 25 mmol/L (ref 22–32)
Calcium: 8.9 mg/dL (ref 8.9–10.3)
Chloride: 104 mmol/L (ref 101–111)
Creatinine, Ser: 0.91 mg/dL (ref 0.61–1.24)
GFR calc Af Amer: >60 mL/min (ref >60)
GFR calc non Af Amer: >60 mL/min (ref >60)
GLUCOSE: 109 mg/dL — AB (ref 65–99)
Potassium: 3.8 mmol/L (ref 3.5–5.1)
Sodium: 136 mmol/L (ref 135–145)
TOTAL PROTEIN: 6.5 g/dL (ref 6.5–8.1)
Total Bilirubin: 0.9 mg/dL (ref 0.3–1.2)

## 2017-08-27 LAB — CBC WITH DIFFERENTIAL/PLATELET
BASOS ABS: 0 10*3/uL (ref 0.0–0.1)
Basophils Relative: 0 %
EOS PCT: 0 %
Eosinophils Absolute: 0 10*3/uL (ref 0.0–0.7)
HCT: 27.4 % — ABNORMAL LOW (ref 39.0–52.0)
Hemoglobin: 9.5 g/dL — ABNORMAL LOW (ref 13.0–17.0)
Lymphocytes Relative: 89 %
Lymphs Abs: 0.4 10*3/uL (ref 0.7–4.0)
MCH: 30.2 pg (ref 26.0–34.0)
MCHC: 34.7 g/dL (ref 30.0–36.0)
MCV: 87 fL (ref 78.0–100.0)
MONO ABS: 0 10*3/uL (ref 0.1–1.0)
Monocytes Relative: 9 %
NEUTROS ABS: 0 10*3/uL (ref 1.7–7.7)
Neutrophils Relative %: 2 %
PLATELETS: 7 10*3/uL — AB (ref 150–400)
RBC: 3.15 MIL/uL — ABNORMAL LOW (ref 4.22–5.81)
RDW: 13.7 % (ref 11.5–15.5)
WBC: 0.4 10*3/uL — CL (ref 4.0–10.5)

## 2017-08-27 LAB — MAGNESIUM: Magnesium: 1.9 mg/dL (ref 1.7–2.4)

## 2017-08-27 LAB — SAMPLE TO BLOOD BANK

## 2017-08-27 MED ORDER — HEPARIN SOD (PORK) LOCK FLUSH 100 UNIT/ML IV SOLN
250.0000 [IU] | INTRAVENOUS | Status: AC | PRN
  Administered 2017-08-27: 250 [IU]
  Filled 2017-08-27: qty 5

## 2017-08-27 MED ORDER — ACETAMINOPHEN 325 MG PO TABS
650.0000 mg | ORAL_TABLET | Freq: Once | ORAL | Status: AC
  Administered 2017-08-27: 650 mg via ORAL
  Filled 2017-08-27: qty 2

## 2017-08-27 MED ORDER — SODIUM CHLORIDE 0.9 % IV SOLN
250.0000 mL | Freq: Once | INTRAVENOUS | Status: AC
  Administered 2017-08-27: 250 mL via INTRAVENOUS

## 2017-08-27 MED ORDER — SODIUM CHLORIDE 0.9% FLUSH
3.0000 mL | INTRAVENOUS | Status: AC | PRN
  Administered 2017-08-27: 3 mL

## 2017-08-27 MED ORDER — DIPHENHYDRAMINE HCL 25 MG PO CAPS
25.0000 mg | ORAL_CAPSULE | Freq: Once | ORAL | Status: AC
  Administered 2017-08-27: 25 mg via ORAL
  Filled 2017-08-27: qty 1

## 2017-08-27 NOTE — Progress Notes (Signed)
CRITICAL VALUE ALERT Critical value received:  WBC-0.4 Date of notification:  08/27/17 Time of notification: 0930 Critical value read back:  Yes.   Nurse who received alert:  M.Lemar Bakos, LPN MD notified (1st page):  S.Katragadda, MD  CRITICAL VALUE ALERT Critical value received:  Platelets-7 Date of notification:  08/27/17 Time of notification: 0930 Critical value read back:  Yes.   Nurse who received alert:  M.Staley Budzinski, LPN MD notified (1st page):  Tomie China, MD

## 2017-08-27 NOTE — Patient Instructions (Signed)
[facility] at [facility] Discharge Instructions  Received 1 unit of Platelets today. Follow-up as scheduled. Call clinic for any questions or concerns   Thank you for choosing [facility] to provide your oncology and hematology care.  To afford each patient quality time with our provider, please arrive at least 15 minutes before your scheduled appointment time.   If you have a lab appointment with the Cancer Center please come in thru the  Main Entrance and check in at the main information desk  You need to re-schedule your appointment should you arrive 10 or more minutes late.  We strive to give you quality time with our providers, and arriving late affects you and other patients whose appointments are after yours.  Also, if you no show three or more times for appointments you may be dismissed from the clinic at the providers discretion.     Again, thank you for choosing [facility].  Our hope is that these requests will decrease the amount of time that you wait before being seen by our physicians.       _____________________________________________________________  Should you have questions after your visit to [facility], please contact our office at [phone] between the hours of 8:30 a.m. and 4:30 p.m.  Voicemails left after 4:30 p.m. will not be returned until the following business day.  For prescription refill requests, have your pharmacy contact our office.       Resources For Cancer Patients and their Caregivers ? American Cancer Society: Can assist with transportation, wigs, general needs, runs Look Good Feel Better.        1-888-227-6333 ? Cancer Care: Provides financial assistance, online support groups, medication/co-pay assistance.  1-800-813-HOPE (4673) ? [facility] Assists [place] cancer patients and their families through emotional , educational and  financial support.  336-427-4357 ? [place] DSS Where to apply for food stamps, Medicaid and utility assistance. 336-342-1394 ? RCATS: Transportation to medical appointments. 336-347-2287 ? Social Security Administration: May apply for disability if have a Stage IV cancer. 336-342-7796 1-800-772-1213 ? [place] Aging, Disability and Transit Services: Assists with nutrition, care and transit needs. 336-349-2343  Cancer Center Support Programs:   > Cancer Support Group  2nd Tuesday of the month 1pm-2pm, Journey Room   > Creative Journey  3rd Tuesday of the month 1130am-1pm, Journey Room    

## 2017-08-27 NOTE — Progress Notes (Signed)
Omar Lee tolerated platelet transfusion well without complaints or incident. Labs reviewed with Dr. Delton Coombes prior to administering the platelets and labs also faxed to [facility]. VSS prior to, during and after this transfusion. Pt continues to take his Venclexta as prescribed without issues.

## 2017-08-28 LAB — PREPARE PLATELET PHERESIS: Unit division: 0

## 2017-08-28 LAB — BPAM PLATELET PHERESIS
BLOOD PRODUCT EXPIRATION DATE: 201905152359
ISSUE DATE / TIME: 201905131348
Unit Type and Rh: 5100

## 2017-08-30 ENCOUNTER — Inpatient Hospital Stay (HOSPITAL_COMMUNITY): Payer: Medicare HMO

## 2017-08-30 ENCOUNTER — Other Ambulatory Visit (HOSPITAL_COMMUNITY): Payer: Self-pay | Admitting: Hematology

## 2017-08-30 ENCOUNTER — Encounter (HOSPITAL_COMMUNITY): Payer: Self-pay

## 2017-08-30 ENCOUNTER — Encounter (HOSPITAL_COMMUNITY): Payer: Medicare HMO

## 2017-08-30 DIAGNOSIS — D469 Myelodysplastic syndrome, unspecified: Secondary | ICD-10-CM

## 2017-08-30 DIAGNOSIS — C9200 Acute myeloblastic leukemia, not having achieved remission: Secondary | ICD-10-CM

## 2017-08-30 DIAGNOSIS — Z5111 Encounter for antineoplastic chemotherapy: Secondary | ICD-10-CM | POA: Diagnosis not present

## 2017-08-30 LAB — MAGNESIUM: Magnesium: 1.8 mg/dL (ref 1.7–2.4)

## 2017-08-30 LAB — CBC WITH DIFFERENTIAL/PLATELET
BASOS ABS: 0 10*3/uL (ref 0.0–0.1)
Basophils Relative: 2 %
EOS ABS: 0 10*3/uL (ref 0.0–0.7)
EOS PCT: 0 %
HCT: 24.9 % — ABNORMAL LOW (ref 39.0–52.0)
Hemoglobin: 8.5 g/dL — ABNORMAL LOW (ref 13.0–17.0)
LYMPHS PCT: 88 %
Lymphs Abs: 0.4 10*3/uL (ref 0.7–4.0)
MCH: 29.8 pg (ref 26.0–34.0)
MCHC: 34.1 g/dL (ref 30.0–36.0)
MCV: 87.4 fL (ref 78.0–100.0)
Monocytes Absolute: 0.1 10*3/uL (ref 0.1–1.0)
Monocytes Relative: 10 %
Neutro Abs: 0 10*3/uL (ref 1.7–7.7)
Neutrophils Relative %: 0 %
PLATELETS: 27 10*3/uL — AB (ref 150–400)
RBC: 2.85 MIL/uL — AB (ref 4.22–5.81)
RDW: 13.8 % (ref 11.5–15.5)
WBC: 0.5 10*3/uL — AB (ref 4.0–10.5)

## 2017-08-30 LAB — COMPREHENSIVE METABOLIC PANEL
ALT: 64 U/L — ABNORMAL HIGH (ref 17–63)
AST: 32 U/L (ref 15–41)
Albumin: 3.9 g/dL (ref 3.5–5.0)
Alkaline Phosphatase: 105 U/L (ref 38–126)
Anion gap: 9 (ref 5–15)
BILIRUBIN TOTAL: 1.1 mg/dL (ref 0.3–1.2)
BUN: 21 mg/dL — AB (ref 6–20)
CO2: 24 mmol/L (ref 22–32)
CREATININE: 1.15 mg/dL (ref 0.61–1.24)
Calcium: 9 mg/dL (ref 8.9–10.3)
Chloride: 105 mmol/L (ref 101–111)
GFR calc Af Amer: >60 mL/min (ref >60)
GFR, EST NON AFRICAN AMERICAN: 59 mL/min — AB (ref >60)
Glucose, Bld: 110 mg/dL — ABNORMAL HIGH (ref 65–99)
POTASSIUM: 4 mmol/L (ref 3.5–5.1)
Sodium: 138 mmol/L (ref 135–145)
TOTAL PROTEIN: 6.4 g/dL — AB (ref 6.5–8.1)

## 2017-08-30 LAB — PREPARE RBC (CROSSMATCH)

## 2017-08-30 MED ORDER — SODIUM CHLORIDE 0.9 % IV SOLN
250.0000 mL | Freq: Once | INTRAVENOUS | Status: AC
  Administered 2017-08-30: 250 mL via INTRAVENOUS

## 2017-08-30 MED ORDER — DIPHENHYDRAMINE HCL 25 MG PO CAPS
ORAL_CAPSULE | ORAL | Status: AC
  Filled 2017-08-30: qty 1

## 2017-08-30 MED ORDER — ACETAMINOPHEN 325 MG PO TABS
ORAL_TABLET | ORAL | Status: AC
  Filled 2017-08-30: qty 2

## 2017-08-30 MED ORDER — HEPARIN SOD (PORK) LOCK FLUSH 100 UNIT/ML IV SOLN
500.0000 [IU] | Freq: Every day | INTRAVENOUS | Status: AC | PRN
  Administered 2017-08-30: 500 [IU]

## 2017-08-30 MED ORDER — SODIUM CHLORIDE 0.9% FLUSH
10.0000 mL | INTRAVENOUS | Status: AC | PRN
  Administered 2017-08-30: 10 mL

## 2017-08-30 MED ORDER — ACETAMINOPHEN 325 MG PO TABS
650.0000 mg | ORAL_TABLET | Freq: Once | ORAL | Status: AC
  Administered 2017-08-30: 650 mg via ORAL

## 2017-08-30 MED ORDER — DIPHENHYDRAMINE HCL 25 MG PO CAPS
25.0000 mg | ORAL_CAPSULE | Freq: Once | ORAL | Status: AC
  Administered 2017-08-30: 25 mg via ORAL

## 2017-08-30 NOTE — Progress Notes (Signed)
Patient to treatment room for dressing change for PICC line with lab work.  Stated fatigue and SOB but no worsening.  Denied fevers and chills with fair appetite.  No s/s of distress noted.   Patient tolerated blood transfusion with no complaints voiced.  PICC line dressing changed per protocol.  Insertion site clean and dry with no drainage or redness noted.  Patient denied pain with flush.  New dressing intact.  VSS with discharge and left ambulatory with no s/s of distress noted.  Reviewed neutropenia precautions with the patient.    Labs faxed to [facility] with confirmation received.

## 2017-08-30 NOTE — Patient Instructions (Signed)
[place] at [facility]  Discharge Instructions:   Neutropenia Neutropenia is a condition that occurs when you have a lower-than-normal level of a type of white blood cell (neutrophil) in your body. Neutrophils are made in the spongy center of large bones (bone marrow) and they fight infections. Neutrophils are your body's main defense against bacterial and fungal infections. The fewer neutrophils you have and the longer your body remains without them, the greater your risk of getting a severe infection. What are the causes? This condition can occur if your body uses up or destroys neutrophils faster than your bone marrow can make them. This problem may happen because of:  Bacterial or fungal infection.  Allergic disorders.  Reactions to some medicines.  Autoimmune disease.  An enlarged spleen.  This condition can also occur if your bone marrow does not produce enough neutrophils. This problem may be caused by:  Cancer.  Cancer treatments, such as radiation or chemotherapy.  Viral infections.  Medicines, such as phenytoin.  Vitamin B12 deficiency.  Diseases of the bone marrow.  Environmental toxins, such as insecticides.  What are the signs or symptoms? This condition does not usually cause symptoms. If symptoms are present, they are usually caused by an underlying infection. Symptoms of an infection may include:  Fever.  Chills.  Swollen glands.  Oral or anal ulcers.  Cough and shortness of breath.  Rash.  Skin infection.  Fatigue.  How is this diagnosed? Your health care provider may suspect neutropenia if you have:  A condition that may cause neutropenia.  Symptoms of infection, especially fever.  Frequent and unusual infections.  You will have a medical history and physical exam. Tests will also be done, such as:  A complete blood count (CBC).  A procedure to collect a sample of bone marrow for examination (bone marrow  biopsy).  A chest X-ray.  A urine culture.  A blood culture.  How is this treated? Treatment depends on the underlying cause and severity of your condition. Mild neutropenia may not require treatment. Treatment may include medicines, such as:  Antibiotic medicine given through an IV tube.  Antiviral medicines.  Antifungal medicines.  A medicine to increase neutrophil production (colony-stimulating factor). You may get this drug through an IV tube or by injection.  Steroids given through an IV tube.  If an underlying condition is causing neutropenia, you may need treatment for that condition. If medicines you are taking are causing neutropenia, your health care provider may have you stop taking those medicines. Follow these instructions at home: Medicines  Take over-the-counter and prescription medicines only as told by your health care provider.  Get a seasonal flu shot (influenza vaccine). Lifestyle  Do not eat unpasteurized foods.Do not eat unwashed raw fruits or vegetables.  Avoid exposure to groups of people or children.  Avoid being around people who are sick.  Avoid being around dirt or dust, such as in construction areas or gardens.  Do not provide direct care for pets. Avoid animal droppings. Do not clean litter boxes and bird cages. Hygiene   Bathe daily.  Clean the area between the genitals and the anus (perineal area) after you urinate or have a bowel movement. If you are male, wipe from front to back.  Brush your teeth with a soft toothbrush before and after meals.  Do not use a razor that has a blade. Use an electric razor to remove hair.  Wash your hands often. Make sure others who  come in contact with you also wash their hands. If soap and water are not available, use hand sanitizer. General instructions  Do not have sex unless your health care provider has approved.  Take actions to avoid cuts and burns. For example: ? Be cautious when you use  knives. Always cut away from yourself. ? Keep knives in protective sheaths or guards when not in use. ? Use oven mitts when you cook with a hot stove, oven, or grill. ? Stand a safe distance away from open fires.  Avoid people who received a vaccine in the past 30 days if that vaccine contained a live version of the germ (live vaccine). You should not get a live vaccine. Common live vaccines are varicella, measles, mumps, and rubella.  Do not share food utensils.  Do not use tampons, enemas, or rectal suppositories unless your health care provider has approved.  Keep all appointments as told by your health care provider. This is important. Contact a health care provider if:  You have a fever.  You have chills or you start to shake.  You have: ? A sore throat. ? A warm, red, or tender area on your skin. ? A cough. ? Frequent or painful urination. ? Vaginal discharge or itching.  You develop: ? Sores in your mouth or anus. ? Swollen lymph nodes. ? Red streaks on the skin. ? A rash.  You feel: ? Nauseous or you vomit. ? Very fatigued. ? Short of breath. This information is not intended to replace advice given to you by your health care provider. Make sure you discuss any questions you have with your health care provider. Document Released: 09/23/2001 Document Revised: 09/09/2015 Document Reviewed: 10/14/2014 Elsevier Interactive Patient Education  2018 [employer].  _______________________________________________________________  Thank you for choosing [place] at [facility] to provide your oncology and hematology care.  To afford each patient quality time with our providers, please arrive at least 15 minutes before your scheduled appointment.  You need to re-schedule your appointment if you arrive 10 or more minutes late.  We strive to give you quality time with our providers, and arriving late affects you and other patients whose appointments are  after yours.  Also, if you no show three or more times for appointments you may be dismissed from the clinic.  Again, thank you for choosing [place] at [place] hope is that these requests will allow you access to exceptional care and in a timely manner. _______________________________________________________________  If you have questions after your visit, please contact our office at [phone] between the hours of 8:30 a.m. and 5:00 p.m. Voicemails left after 4:30 p.m. will not be returned until the following business day. _______________________________________________________________  For prescription refill requests, have your pharmacy contact our office. _______________________________________________________________  Recommendations made by the consultant and any test results will be sent to your referring physician. _______________________________________________________________

## 2017-08-30 NOTE — Progress Notes (Signed)
See other encounter.

## 2017-08-30 NOTE — Progress Notes (Signed)
CRITICAL VALUE ALERT Critical value received:  Platelets 27 Date of notification:  08/30/17 Time of notification: 0922 Critical value read back:  Yes.   Nurse who received alert:  M.Crystale Giannattasio,LPN MD notified (1st page):  S.Katragadda, MD  CRITICAL VALUE ALERT Critical value received:  WBC-0.5 Date of notification:  08/30/17 Time of notification: 0922 Critical value read back:  Yes.   Nurse who received alert:  M.Rosser [place],LPN MD notified (1st page):  S.Katragadda, MD

## 2017-08-31 LAB — BPAM RBC
Blood Product Expiration Date: 201906062359
ISSUE DATE / TIME: 201905161058
Unit Type and Rh: 6200

## 2017-08-31 LAB — TYPE AND SCREEN
ABO/RH(D): A POS
Antibody Screen: NEGATIVE
UNIT DIVISION: 0

## 2017-09-03 ENCOUNTER — Inpatient Hospital Stay (HOSPITAL_COMMUNITY): Payer: Medicare HMO

## 2017-09-03 ENCOUNTER — Other Ambulatory Visit: Payer: Self-pay

## 2017-09-03 ENCOUNTER — Encounter (HOSPITAL_COMMUNITY): Payer: Self-pay

## 2017-09-03 DIAGNOSIS — C9200 Acute myeloblastic leukemia, not having achieved remission: Secondary | ICD-10-CM

## 2017-09-03 DIAGNOSIS — Z5111 Encounter for antineoplastic chemotherapy: Secondary | ICD-10-CM | POA: Diagnosis not present

## 2017-09-03 LAB — CBC WITH DIFFERENTIAL/PLATELET
BASOS ABS: 0 10*3/uL (ref 0.0–0.1)
BASOS PCT: 0 %
EOS PCT: 0 %
Eosinophils Absolute: 0 10*3/uL (ref 0.0–0.7)
HCT: 25.5 % — ABNORMAL LOW (ref 39.0–52.0)
Hemoglobin: 8.8 g/dL — ABNORMAL LOW (ref 13.0–17.0)
Lymphocytes Relative: 89 %
Lymphs Abs: 0.5 10*3/uL (ref 0.7–4.0)
MCH: 30.1 pg (ref 26.0–34.0)
MCHC: 34.5 g/dL (ref 30.0–36.0)
MCV: 87.3 fL (ref 78.0–100.0)
MONO ABS: 0.1 10*3/uL (ref 0.1–1.0)
Monocytes Relative: 9 %
Neutro Abs: 0 10*3/uL (ref 1.7–7.7)
Neutrophils Relative %: 2 %
Platelets: 5 10*3/uL — CL (ref 150–400)
RBC: 2.92 MIL/uL — ABNORMAL LOW (ref 4.22–5.81)
RDW: 13.6 % (ref 11.5–15.5)
WBC: 0.6 10*3/uL — CL (ref 4.0–10.5)

## 2017-09-03 LAB — COMPREHENSIVE METABOLIC PANEL
ALBUMIN: 4.1 g/dL (ref 3.5–5.0)
ALT: 60 U/L (ref 17–63)
ANION GAP: 10 (ref 5–15)
AST: 30 U/L (ref 15–41)
Alkaline Phosphatase: 111 U/L (ref 38–126)
BUN: 27 mg/dL — ABNORMAL HIGH (ref 6–20)
CO2: 24 mmol/L (ref 22–32)
Calcium: 9.3 mg/dL (ref 8.9–10.3)
Chloride: 102 mmol/L (ref 101–111)
Creatinine, Ser: 1.01 mg/dL (ref 0.61–1.24)
GFR calc Af Amer: >60 mL/min (ref >60)
GFR calc non Af Amer: >60 mL/min (ref >60)
GLUCOSE: 105 mg/dL — AB (ref 65–99)
Potassium: 3.9 mmol/L (ref 3.5–5.1)
SODIUM: 136 mmol/L (ref 135–145)
TOTAL PROTEIN: 6.6 g/dL (ref 6.5–8.1)
Total Bilirubin: 1.2 mg/dL (ref 0.3–1.2)

## 2017-09-03 LAB — SAMPLE TO BLOOD BANK

## 2017-09-03 LAB — MAGNESIUM: MAGNESIUM: 1.9 mg/dL (ref 1.7–2.4)

## 2017-09-03 LAB — PREPARE RBC (CROSSMATCH)

## 2017-09-03 MED ORDER — SODIUM CHLORIDE 0.9% FLUSH
10.0000 mL | INTRAVENOUS | Status: AC | PRN
  Administered 2017-09-03: 10 mL

## 2017-09-03 MED ORDER — DIPHENHYDRAMINE HCL 25 MG PO CAPS
25.0000 mg | ORAL_CAPSULE | Freq: Once | ORAL | Status: AC
  Administered 2017-09-03: 25 mg via ORAL
  Filled 2017-09-03: qty 1

## 2017-09-03 MED ORDER — HEPARIN SOD (PORK) LOCK FLUSH 100 UNIT/ML IV SOLN
250.0000 [IU] | INTRAVENOUS | Status: DC | PRN
  Filled 2017-09-03: qty 5

## 2017-09-03 MED ORDER — ACETAMINOPHEN 325 MG PO TABS
650.0000 mg | ORAL_TABLET | Freq: Once | ORAL | Status: AC
  Administered 2017-09-03: 650 mg via ORAL
  Filled 2017-09-03: qty 2

## 2017-09-03 MED ORDER — SODIUM CHLORIDE 0.9 % IV SOLN
250.0000 mL | Freq: Once | INTRAVENOUS | Status: AC
  Administered 2017-09-03: 250 mL via INTRAVENOUS

## 2017-09-03 NOTE — Progress Notes (Signed)
Platelets are 5 and wbc 0.6. Hemoglobin 8.8. Patient states he wants to get one unit of blood today with his platelets. He stated he does feel tired and had a rough weekend. Per protocol will given platelets and one unit of blood per parameters.   Treatment given per orders. Patient tolerated it well without problems. Vitals stable and discharged home from clinic ambulatory. Follow up as scheduled.

## 2017-09-03 NOTE — Progress Notes (Signed)
CRITICAL VALUE ALERT Critical value received:  WBC 0.6 Platelets 5 Date of notification:  09/03/2017 Time of notification: 1030 Critical value read back:  Yes.   Nurse who received alert:  Joanne Gavel RN MD notified (1st page):  Dr Raliegh Ip

## 2017-09-03 NOTE — Patient Instructions (Signed)
[place] at [facility] Discharge Instructions  One unit of blood and one unit of platelets given today. Follow up as scheduled.   Thank you for choosing [place] at [facility] to provide your oncology and hematology care.  To afford each patient quality time with our provider, please arrive at least 15 minutes before your scheduled appointment time.   If you have a lab appointment with the [place] please come in thru the  Main Entrance and check in at the main information desk  You need to re-schedule your appointment should you arrive 10 or more minutes late.  We strive to give you quality time with our providers, and arriving late affects you and other patients whose appointments are after yours.  Also, if you no show three or more times for appointments you may be dismissed from the clinic at the providers discretion.     Again, thank you for choosing [facility].  Our hope is that these requests will decrease the amount of time that you wait before being seen by our physicians.       _____________________________________________________________  Should you have questions after your visit to [facility], please contact our office at [phone] between the hours of 8:30 a.m. and 4:30 p.m.  Voicemails left after 4:30 p.m. will not be returned until the following business day.  For prescription refill requests, have your pharmacy contact our office.       Resources For Cancer Patients and their Caregivers ? American Cancer Society: Can assist with transportation, wigs, general needs, runs Look Good Feel Better.        (614)072-9995 ? Cancer Care: Provides financial assistance, online support groups, medication/co-pay assistance.  1-800-813-HOPE 3194099598) ? [place] Assists [place] cancer patients and their families through emotional , educational and financial support.   (289)864-5560 ? [place] DSS Where to apply for food stamps, Medicaid and utility assistance. 213-359-7230 ? RCATS: Transportation to medical appointments. (306) 614-9182 ? Social Security Administration: May apply for disability if have a Stage IV cancer. (705)365-8302 (224) [phone] ? [employer], Disability and Transit Services: Assists with nutrition, care and transit needs. [place] Support Programs:   > Cancer Support Group  2nd Tuesday of the month 1pm-2pm, Journey Room   > Creative Journey  3rd Tuesday of the month 1130am-1pm, Journey Room

## 2017-09-04 DIAGNOSIS — E785 Hyperlipidemia, unspecified: Secondary | ICD-10-CM | POA: Diagnosis not present

## 2017-09-04 DIAGNOSIS — C9200 Acute myeloblastic leukemia, not having achieved remission: Secondary | ICD-10-CM | POA: Diagnosis not present

## 2017-09-04 DIAGNOSIS — D709 Neutropenia, unspecified: Secondary | ICD-10-CM | POA: Diagnosis not present

## 2017-09-04 DIAGNOSIS — E039 Hypothyroidism, unspecified: Secondary | ICD-10-CM | POA: Diagnosis not present

## 2017-09-04 DIAGNOSIS — D61818 Other pancytopenia: Secondary | ICD-10-CM | POA: Diagnosis not present

## 2017-09-04 LAB — BPAM PLATELET PHERESIS
Blood Product Expiration Date: 201905202359
ISSUE DATE / TIME: 201905201444
UNIT TYPE AND RH: 8400

## 2017-09-04 LAB — PREPARE PLATELET PHERESIS: UNIT DIVISION: 0

## 2017-09-04 LAB — TYPE AND SCREEN
ABO/RH(D): A POS
ANTIBODY SCREEN: NEGATIVE
Unit division: 0

## 2017-09-04 LAB — BPAM RBC
BLOOD PRODUCT EXPIRATION DATE: 201905212359
ISSUE DATE / TIME: 201905201250
UNIT TYPE AND RH: 5100

## 2017-09-06 ENCOUNTER — Other Ambulatory Visit (HOSPITAL_COMMUNITY): Payer: Self-pay | Admitting: Emergency Medicine

## 2017-09-06 ENCOUNTER — Encounter (HOSPITAL_COMMUNITY): Payer: Self-pay

## 2017-09-06 ENCOUNTER — Inpatient Hospital Stay (HOSPITAL_COMMUNITY): Payer: Medicare HMO

## 2017-09-06 DIAGNOSIS — C9200 Acute myeloblastic leukemia, not having achieved remission: Secondary | ICD-10-CM

## 2017-09-06 DIAGNOSIS — D469 Myelodysplastic syndrome, unspecified: Secondary | ICD-10-CM

## 2017-09-06 DIAGNOSIS — Z5111 Encounter for antineoplastic chemotherapy: Secondary | ICD-10-CM | POA: Diagnosis not present

## 2017-09-06 LAB — COMPREHENSIVE METABOLIC PANEL
ALT: 48 U/L (ref 17–63)
AST: 26 U/L (ref 15–41)
Albumin: 3.9 g/dL (ref 3.5–5.0)
Alkaline Phosphatase: 108 U/L (ref 38–126)
Anion gap: 7 (ref 5–15)
BILIRUBIN TOTAL: 1 mg/dL (ref 0.3–1.2)
BUN: 23 mg/dL — AB (ref 6–20)
CALCIUM: 9 mg/dL (ref 8.9–10.3)
CHLORIDE: 106 mmol/L (ref 101–111)
CO2: 24 mmol/L (ref 22–32)
CREATININE: 1.02 mg/dL (ref 0.61–1.24)
Glucose, Bld: 119 mg/dL — ABNORMAL HIGH (ref 65–99)
Potassium: 3.7 mmol/L (ref 3.5–5.1)
Sodium: 137 mmol/L (ref 135–145)
TOTAL PROTEIN: 6.6 g/dL (ref 6.5–8.1)

## 2017-09-06 LAB — CBC WITH DIFFERENTIAL/PLATELET
BASOS ABS: 0 10*3/uL (ref 0.0–0.1)
BASOS PCT: 0 %
EOS ABS: 0 10*3/uL (ref 0.0–0.7)
EOS PCT: 0 %
HCT: 25.4 % — ABNORMAL LOW (ref 39.0–52.0)
HEMOGLOBIN: 8.8 g/dL — AB (ref 13.0–17.0)
LYMPHS ABS: 0.5 10*3/uL (ref 0.7–4.0)
Lymphocytes Relative: 87 %
MCH: 30.2 pg (ref 26.0–34.0)
MCHC: 34.6 g/dL (ref 30.0–36.0)
MCV: 87.3 fL (ref 78.0–100.0)
Monocytes Absolute: 0.1 10*3/uL (ref 0.1–1.0)
Monocytes Relative: 9 %
NEUTROS PCT: 4 %
Neutro Abs: 0 10*3/uL (ref 1.7–7.7)
Platelets: 19 10*3/uL — CL (ref 150–400)
RBC: 2.91 MIL/uL — AB (ref 4.22–5.81)
RDW: 13.5 % (ref 11.5–15.5)
WBC: 0.5 10*3/uL — AB (ref 4.0–10.5)

## 2017-09-06 LAB — MAGNESIUM: MAGNESIUM: 1.8 mg/dL (ref 1.7–2.4)

## 2017-09-06 LAB — PREPARE RBC (CROSSMATCH)

## 2017-09-06 MED ORDER — HEPARIN SOD (PORK) LOCK FLUSH 100 UNIT/ML IV SOLN: 500.0000 [IU] | Freq: Every day | INTRAVENOUS | Status: DC | PRN

## 2017-09-06 MED ORDER — ALLOPURINOL 300 MG PO TABS: 300.0000 mg | ORAL_TABLET | Freq: Every day | ORAL | 0 refills | Status: DC

## 2017-09-06 MED ORDER — SODIUM CHLORIDE 0.9% FLUSH
10.0000 mL | INTRAVENOUS | Status: AC | PRN
  Administered 2017-09-06: 10 mL

## 2017-09-06 MED ORDER — HEPARIN SOD (PORK) LOCK FLUSH 100 UNIT/ML IV SOLN
500.0000 [IU] | Freq: Once | INTRAVENOUS | Status: AC
  Administered 2017-09-06: 500 [IU] via INTRAVENOUS
  Filled 2017-09-06: qty 5

## 2017-09-06 MED ORDER — SODIUM CHLORIDE 0.9% FLUSH
10.0000 mL | Freq: Once | INTRAVENOUS | Status: AC
  Administered 2017-09-06: 10 mL via INTRAVENOUS

## 2017-09-06 MED ORDER — ACETAMINOPHEN 325 MG PO TABS
650.0000 mg | ORAL_TABLET | Freq: Once | ORAL | Status: AC
  Administered 2017-09-06: 650 mg via ORAL
  Filled 2017-09-06: qty 2

## 2017-09-06 MED ORDER — ALLOPURINOL 300 MG PO TABS: 300.0000 mg | ORAL_TABLET | Freq: Every day | ORAL | 0 refills | Status: AC

## 2017-09-06 MED ORDER — DIPHENHYDRAMINE HCL 25 MG PO CAPS
25.0000 mg | ORAL_CAPSULE | Freq: Once | ORAL | Status: AC
  Administered 2017-09-06: 25 mg via ORAL
  Filled 2017-09-06: qty 1

## 2017-09-06 MED ORDER — SODIUM CHLORIDE 0.9 % IV SOLN
250.0000 mL | Freq: Once | INTRAVENOUS | Status: AC
  Administered 2017-09-06: 250 mL via INTRAVENOUS

## 2017-09-06 NOTE — Progress Notes (Signed)
CRITICAL VALUE ALERT Critical value received:  WBC-0.5 Date of notification:  09/06/17 Time of notification: 5465 Critical value read back:  Yes.   Nurse who received alert:  M.Janalyn Higby, LPN MD notified (1st page):  S.Katragadda, MD  CRITICAL VALUE ALERT Critical value received:  Platelets-19 Date of notification:  09/06/17 Time of notification: 0354 Critical value read back:  Yes.   Nurse who received alert:  M.Dossie Ocanas, LPN MD notified (1st page):  S.KAtragadda, MD

## 2017-09-06 NOTE — Patient Instructions (Signed)
[place] at [facility]  Discharge Instructions:  You received a blood transfusion and platelet transfusion today.   _______________________________________________________________  Thank you for choosing [place] at [facility] to provide your oncology and hematology care.  To afford each patient quality time with our providers, please arrive at least 15 minutes before your scheduled appointment.  You need to re-schedule your appointment if you arrive 10 or more minutes late.  We strive to give you quality time with our providers, and arriving late affects you and other patients whose appointments are after yours.  Also, if you no show three or more times for appointments you may be dismissed from the clinic.  Again, thank you for choosing [place] at [place] hope is that these requests will allow you access to exceptional care and in a timely manner. _______________________________________________________________  If you have questions after your visit, please contact our office at [phone] between the hours of 8:30 a.m. and 5:00 p.m. Voicemails left after 4:30 p.m. will not be returned until the following business day. _______________________________________________________________  For prescription refill requests, have your pharmacy contact our office. _______________________________________________________________  Recommendations made by the consultant and any test results will be sent to your referring physician. _______________________________________________________________

## 2017-09-06 NOTE — Progress Notes (Signed)
Patient tolerated blood transfusion and platelet transfusion with no complaints voiced.  PICC line dressing clean and dry with no bruising or swelling noted at site.  Small amount of dried blood noted at [facility].  No complaints of pain with flush.  Dressing reinforced.  VSS with discharge and left ambulatory with no s/s of distress noted.

## 2017-09-07 ENCOUNTER — Encounter (HOSPITAL_COMMUNITY): Payer: Self-pay | Admitting: Hematology

## 2017-09-07 LAB — PREPARE PLATELET PHERESIS: Unit division: 0

## 2017-09-07 LAB — BPAM RBC
Blood Product Expiration Date: 201906062359
ISSUE DATE / TIME: 201905231127
Unit Type and Rh: 6200

## 2017-09-07 LAB — TYPE AND SCREEN
ABO/RH(D): A POS
ANTIBODY SCREEN: NEGATIVE
Unit division: 0

## 2017-09-07 LAB — BPAM PLATELET PHERESIS
Blood Product Expiration Date: 201905232359
ISSUE DATE / TIME: 201905231417
UNIT TYPE AND RH: 5100

## 2017-09-07 NOTE — Progress Notes (Signed)
I spoke with Dr. Florene Glen from [facility].  He informed that a bone marrow biopsy was done on 09/04/2017 which was hypocellular with no excess blasts.  Omar Lee will come and see [place] next Tuesday.  We will check his blood counts at that time.  If his [place] is less than thousand or platelet count less than 50,000, we will hold venetoclax and decitabine for next 2 weeks.

## 2017-09-11 ENCOUNTER — Inpatient Hospital Stay (HOSPITAL_BASED_OUTPATIENT_CLINIC_OR_DEPARTMENT_OTHER): Payer: Medicare HMO | Admitting: Hematology

## 2017-09-11 ENCOUNTER — Inpatient Hospital Stay (HOSPITAL_COMMUNITY): Payer: Medicare HMO

## 2017-09-11 ENCOUNTER — Encounter (HOSPITAL_COMMUNITY): Payer: Self-pay | Admitting: Hematology

## 2017-09-11 ENCOUNTER — Other Ambulatory Visit: Payer: Self-pay

## 2017-09-11 VITALS — BP 108/71 | HR 58 | Temp 98.0°F | Resp 18

## 2017-09-11 VITALS — BP 134/73 | HR 80 | Temp 98.9°F | Resp 18 | Wt 180.1 lb

## 2017-09-11 DIAGNOSIS — C9200 Acute myeloblastic leukemia, not having achieved remission: Secondary | ICD-10-CM | POA: Diagnosis not present

## 2017-09-11 DIAGNOSIS — D4621 Refractory anemia with excess of blasts 1: Secondary | ICD-10-CM | POA: Diagnosis not present

## 2017-09-11 DIAGNOSIS — R5383 Other fatigue: Secondary | ICD-10-CM

## 2017-09-11 DIAGNOSIS — Z5111 Encounter for antineoplastic chemotherapy: Secondary | ICD-10-CM | POA: Diagnosis not present

## 2017-09-11 LAB — COMPREHENSIVE METABOLIC PANEL
ALT: 38 U/L (ref 17–63)
ANION GAP: 8 (ref 5–15)
AST: 22 U/L (ref 15–41)
Albumin: 3.8 g/dL (ref 3.5–5.0)
Alkaline Phosphatase: 107 U/L (ref 38–126)
BUN: 24 mg/dL — ABNORMAL HIGH (ref 6–20)
CALCIUM: 9.1 mg/dL (ref 8.9–10.3)
CO2: 26 mmol/L (ref 22–32)
CREATININE: 1.1 mg/dL (ref 0.61–1.24)
Chloride: 105 mmol/L (ref 101–111)
Glucose, Bld: 105 mg/dL — ABNORMAL HIGH (ref 65–99)
Potassium: 4 mmol/L (ref 3.5–5.1)
SODIUM: 139 mmol/L (ref 135–145)
Total Bilirubin: 1.1 mg/dL (ref 0.3–1.2)
Total Protein: 6.6 g/dL (ref 6.5–8.1)

## 2017-09-11 LAB — CBC WITH DIFFERENTIAL/PLATELET
Basophils Absolute: 0 10*3/uL (ref 0.0–0.1)
Basophils Relative: 0 %
EOS PCT: 0 %
Eosinophils Absolute: 0 10*3/uL (ref 0.0–0.7)
HEMATOCRIT: 26.5 % — AB (ref 39.0–52.0)
Hemoglobin: 9.1 g/dL — ABNORMAL LOW (ref 13.0–17.0)
LYMPHS ABS: 0.4 10*3/uL (ref 0.7–4.0)
Lymphocytes Relative: 89 %
MCH: 30.3 pg (ref 26.0–34.0)
MCHC: 34.3 g/dL (ref 30.0–36.0)
MCV: 88.3 fL (ref 78.0–100.0)
Monocytes Absolute: 0.1 10*3/uL (ref 0.1–1.0)
Monocytes Relative: 11 %
NEUTROS ABS: 0 10*3/uL (ref 1.7–7.7)
Neutrophils Relative %: 0 %
PLATELETS: 20 10*3/uL — AB (ref 150–400)
RBC: 3 MIL/uL — AB (ref 4.22–5.81)
RDW: 13.4 % (ref 11.5–15.5)
WBC: 0.4 10*3/uL — AB (ref 4.0–10.5)

## 2017-09-11 LAB — MAGNESIUM: Magnesium: 1.8 mg/dL (ref 1.7–2.4)

## 2017-09-11 MED ORDER — DIPHENHYDRAMINE HCL 25 MG PO CAPS
25.0000 mg | ORAL_CAPSULE | Freq: Once | ORAL | Status: AC
  Administered 2017-09-11: 25 mg via ORAL

## 2017-09-11 MED ORDER — HEPARIN SOD (PORK) LOCK FLUSH 100 UNIT/ML IV SOLN
250.0000 [IU] | INTRAVENOUS | Status: AC | PRN
  Administered 2017-09-11: 250 [IU]
  Filled 2017-09-11: qty 5

## 2017-09-11 MED ORDER — ACETAMINOPHEN 325 MG PO TABS
650.0000 mg | ORAL_TABLET | Freq: Once | ORAL | Status: AC
  Administered 2017-09-11: 650 mg via ORAL

## 2017-09-11 MED ORDER — SODIUM CHLORIDE 0.9 % IV SOLN
250.0000 mL | Freq: Once | INTRAVENOUS | Status: AC
  Administered 2017-09-11: 250 mL via INTRAVENOUS

## 2017-09-11 MED ORDER — ACETAMINOPHEN 325 MG PO TABS
ORAL_TABLET | ORAL | Status: AC
  Filled 2017-09-11: qty 2

## 2017-09-11 MED ORDER — DIPHENHYDRAMINE HCL 25 MG PO CAPS
ORAL_CAPSULE | ORAL | Status: AC
  Filled 2017-09-11: qty 1

## 2017-09-11 MED ORDER — HEPARIN SOD (PORK) LOCK FLUSH 100 UNIT/ML IV SOLN
INTRAVENOUS | Status: AC
  Filled 2017-09-11: qty 5

## 2017-09-11 MED ORDER — SODIUM CHLORIDE 0.9% FLUSH
10.0000 mL | INTRAVENOUS | Status: AC | PRN
  Administered 2017-09-11: 10 mL

## 2017-09-11 NOTE — Assessment & Plan Note (Signed)
1.  AML with myelodysplasia related changes:  - Cycle 1 of decitabine 20 mg/m2 IV x5 days, along with venetoclax 400 mg daily started on 07/13/2017 - bone marrow on 08/07/2017 shows decrease in bone marrow blasts to around 36%.  Initial biopsy showed 86% blasts. - Cycle 2 decitabine on 08/12/2017, requiring weekly transfusions.  He had bone marrow biopsy done on 09/04/2017 which shows hypocellular with no excess blasts. -Today his white count is 0.4 and platelet count is 20.  Hemoglobin is 9.1.  He had a mild bleeding per rectum on Thursday.  No other bleeding was reported.  He took his last venetoclax on Sunday.  We will continue to hold both venetoclax and decitabine until blood counts completely recover.  We will continue to monitor his blood counts closely.  Today we will give him platelet transfusion as he had bleed last week.  I will reevaluate him in 2 weeks.  I have discussed this with Dr. Florene Glen on Friday.  2.  Access: He has a PICC line in the right forearm placed at [facility].  The PICC line site shows no infection or bleeding.  3.  ID: There is no indication for prophylactic antibiotics at this time.

## 2017-09-11 NOTE — Patient Instructions (Signed)
[facility] at [facility]  Discharge Instructions:  You received a blood transfusion today.  _______________________________________________________________  Thank you for choosing [facility] at [facility] to provide your oncology and hematology care.  To afford each patient quality time with our providers, please arrive at least 15 minutes before your scheduled appointment.  You need to re-schedule your appointment if you arrive 10 or more minutes late.  We strive to give you quality time with our providers, and arriving late affects you and other patients whose appointments are after yours.  Also, if you no show three or more times for appointments you may be dismissed from the clinic.  Again, thank you for choosing [facility]. Our hope is that these requests will allow you access to exceptional care and in a timely manner. _______________________________________________________________  If you have questions after your visit, please contact our office at [phone] between the hours of 8:30 a.m. and 5:00 p.m. Voicemails left after 4:30 p.m. will not be returned until the following business day. _______________________________________________________________  For prescription refill requests, have your pharmacy contact our office. _______________________________________________________________  Recommendations made by the consultant and any test results will be sent to your referring physician. _______________________________________________________________ 

## 2017-09-11 NOTE — Progress Notes (Signed)
Omar Lee, [place] [address]   CLINIC:  Medical Oncology/Hematology  PCP:  Omar Lee, [place] [address] [phone]   REASON FOR VISIT:  Follow-up for AML.  CURRENT THERAPY: Venetoclax and decitabine on hold.  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  Cytogenetics with minus Y only.  IPSS-R 3.5, intermediate risk (0 for cyto, 2.0 for blasts, 1.5 for Hb, 0 for platelets, 0 for ANC).      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  FLT3-ITD/TKD negative.  AML panel positive for NRAS,STAG2,TET2, U2AF1, DNMT3A      07/13/2017 -  Chemotherapy    C1 decitabine 20 mg/m2 IV daily x5 days with venetoclax 400 mg daily.  Given Hydrea prior to starting when it collects due to leukocytosis.        INTERVAL HISTORY:  Omar Lee 77 y.o. male returns for routine follow-up and consideration for next cycle of chemotherapy.  Denies any nausea, vomiting, diarrhea.  He continues to have fatigue.  Some days he falls asleep at 11 AM.  He reported having rectal bleeding on Thursday.  He noticed some blood on the tissue.  There was also a few drops of blood on the commode.  No other bleeding was reported.  Denies any fevers or chills.  No ER visits.  He does have occasional dizziness.  Easy bruising is also noted.  He had a bone marrow biopsy done on 09/04/2017 at [facility].  He took his last venetoclax on 09/09/2017.    REVIEW OF SYSTEMS:  Review of Systems  Constitutional: Positive for fatigue.  Neurological: Positive for dizziness.  Hematological: Bruises/bleeds  easily.  All other systems reviewed and are negative.    PAST MEDICAL/SURGICAL HISTORY:  Past Medical History:  Diagnosis Date  . Cancer ([place])    MDS  . Hyperlipidemia   . Hypothyroidism   . Reflux   . Sleep apnea    Past Surgical History:  Procedure Laterality Date  . ESOPHAGOGASTRODUODENOSCOPY (EGD) WITH ESOPHAGEAL DILATION N/A 11/22/2012   Procedure: ESOPHAGOGASTRODUODENOSCOPY (EGD) WITH ESOPHAGEAL DILATION;  Surgeon: Omar Houston, MD;  Location: [facility];  Service: Endoscopy;  Laterality: N/A;  1120  . HEMORRHOID SURGERY    . NASAL SEPTUM SURGERY    . SHOULDER SURGERY Right   . TONSILLECTOMY AND ADENOIDECTOMY       SOCIAL HISTORY:  Social History   Socioeconomic History  . Marital status: Divorced    Spouse name: Not on file  . Number of children: Not on file  . Years of education: Not on file  . Highest education level: Not on file  Occupational History  . Not on file  Social Needs  . Financial resource strain: Not on file  . Food insecurity:    Worry: Not on file    Inability: Not on file  . Transportation needs:    Medical: Not on file    Non-medical: Not on file  Tobacco Use  . Smoking status: Never Smoker  . Smokeless tobacco: Never Used  Substance and  Sexual Activity  . Alcohol use: No  . Drug use: No  . Sexual activity: Not Currently    Birth control/protection: None  Lifestyle  . Physical activity:    Days per week: Not on file    Minutes per session: Not on file  . Stress: Not on file  Relationships  . Social connections:    Talks on phone: Not on file    Gets together: Not on file    Attends religious service: Not on file    Active member of club or organization: Not on file    Attends meetings of clubs or organizations: Not on file    Relationship status: Not on file  . Intimate partner violence:    Fear of current or ex partner: Not on file    Emotionally abused: Not on file    Physically abused: Not on file    Forced sexual  activity: Not on file  Other Topics Concern  . Not on file  Social History Narrative  . Not on file    FAMILY HISTORY:  Family History  Problem Relation Age of Onset  . Hypertension Mother   . Heart attack Mother   . Heart attack Father   . Diabetes Brother     CURRENT MEDICATIONS:  Outpatient Encounter Medications as of 09/11/2017  Medication Sig Note  . acetaminophen (TYLENOL) 325 MG tablet Take 650 mg by mouth as needed.   Marland Kitchen allopurinol (ZYLOPRIM) 300 MG tablet Take 1 tablet (300 mg total) by mouth daily.   Marland Kitchen doxycycline (VIBRA-TABS) 100 MG tablet    . fluconazole (DIFLUCAN) 200 MG tablet    . hydroxyurea (HYDREA) 500 MG capsule Take 1 capsule (500 mg total) by mouth 2 (two) times daily. May take with food to minimize GI side effects.   Marland Kitchen levofloxacin (LEVAQUIN) 500 MG tablet    . levothyroxine (SYNTHROID, LEVOTHROID) 112 MCG tablet Take 1 tablet (112 mcg total) by mouth daily.   . pantoprazole (PROTONIX) 40 MG tablet    . traMADol (ULTRAM) 50 MG tablet    . VENCLEXTA 100 MG TABS  09/11/2017: Per Omar Lee stop 09/09/17 until further notice   Facility-Administered Encounter Medications as of 09/11/2017  Medication  . 0.9 %  sodium chloride infusion  . acetaminophen (TYLENOL) tablet 650 mg  . diphenhydrAMINE (BENADRYL) capsule 25 mg  . sodium chloride flush (NS) 0.9 % injection 10 mL  . sodium chloride flush (NS) 0.9 % injection 10 mL  . sodium chloride flush (NS) 0.9 % injection 10 mL    ALLERGIES:  No Known Allergies   PHYSICAL EXAM:  ECOG Performance status: 1  Vitals:   09/11/17 0926  BP: 134/73  Pulse: 80  Resp: 18  Temp: 98.9 F (37.2 C)  SpO2: 99%   Filed Weights   09/11/17 0926  Weight: 180 lb 1.6 oz (81.7 kg)    Physical Exam HEENT shows no thrush in the oropharynx.  No ecchymosis. Chest is bilaterally clear to auscultation. Cardiovascular S1-S2 regular rate and rhythm. Skin ecchymosis on the upper extremities present.  Skin at right upper  extremity PICC line site does not show any sign of infection or bleeding. Extremities no edema cyanosis. Abdomen is soft nontender.  LABORATORY DATA:  I have reviewed the labs as listed.  CBC    Component Value Date/Time   WBC 0.4 (LL) 09/11/2017 0920   RBC 3.00 (L) 09/11/2017 0920   HGB 9.1 (L) 09/11/2017 0920   HGB 6.7 (LL) 06/23/2016  0825   HCT 26.5 (L) 09/11/2017 0920   HCT 20.1 (L) 06/23/2016 0825   PLT 20 (LL) 09/11/2017 0920   PLT 182 06/23/2016 0825   MCV 88.3 09/11/2017 0920   MCV 106 (H) 06/23/2016 0825   MCH 30.3 09/11/2017 0920   MCHC 34.3 09/11/2017 0920   RDW 13.4 09/11/2017 0920   RDW 16.4 (H) 06/23/2016 0825   LYMPHSABS 0.4 09/11/2017 0920   LYMPHSABS 0.9 06/23/2016 0825   MONOABS 0.1 09/11/2017 0920   EOSABS 0.0 09/11/2017 0920   EOSABS 0.0 06/23/2016 0825   BASOSABS 0.0 09/11/2017 0920   BASOSABS 0.0 06/23/2016 0825   CMP Latest Ref Rng & Units 09/11/2017 09/06/2017 09/03/2017  Glucose 65 - 99 mg/dL 105(H) 119(H) 105(H)  BUN 6 - 20 mg/dL 24(H) 23(H) 27(H)  Creatinine 0.61 - 1.24 mg/dL 1.10 1.02 1.01  Sodium 135 - 145 mmol/L 139 137 136  Potassium 3.5 - 5.1 mmol/L 4.0 3.7 3.9  Chloride 101 - 111 mmol/L 105 106 102  CO2 22 - 32 mmol/L 26 24 24   Calcium 8.9 - 10.3 mg/dL 9.1 9.0 9.3  Total Protein 6.5 - 8.1 g/dL 6.6 6.6 6.6  Total Bilirubin 0.3 - 1.2 mg/dL 1.1 1.0 1.2  Alkaline Phos 38 - 126 U/L 107 108 111  AST 15 - 41 U/L 22 26 30   ALT 17 - 63 U/L 38 48 60        ASSESSMENT & PLAN:   AML (acute myeloblastic leukemia) ([facility]) 1.  AML with myelodysplasia related changes:  - Cycle 1 of decitabine 20 mg/m2 IV x5 days, along with venetoclax 400 mg daily started on 07/13/2017 - bone marrow on 08/07/2017 shows decrease in bone marrow blasts to around 36%.  Initial biopsy showed 86% blasts. - Cycle 2 decitabine on 08/12/2017, requiring weekly transfusions.  He had bone marrow biopsy done on 09/04/2017 which shows hypocellular with no excess blasts. -Today his  white count is 0.4 and platelet count is 20.  Hemoglobin is 9.1.  He had a mild bleeding per rectum on Thursday.  No other bleeding was reported.  He took his last venetoclax on Sunday.  We will continue to hold both venetoclax and decitabine until blood counts completely recover.  We will continue to monitor his blood counts closely.  Today we will give him platelet transfusion as he had bleed last week.  I will reevaluate him in 2 weeks.  I have discussed this with Omar Lee on Friday.  2.  Access: He has a PICC line in the right forearm placed at [facility].  The PICC line site shows no infection or bleeding.  3.  ID: There is no indication for prophylactic antibiotics at this time.      Orders placed this encounter:  Orders Placed This Encounter  Procedures  . CBC with Differential  . Comprehensive metabolic panel  . Lactate dehydrogenase      Omar Jack, MD [place] [phone]

## 2017-09-11 NOTE — Patient Instructions (Signed)
[facility] at [facility] Discharge Instructions  Today you saw Dr. K.   Thank you for choosing [facility] at [facility] to provide your oncology and hematology care.  To afford each patient quality time with our provider, please arrive at least 15 minutes before your scheduled appointment time.   If you have a lab appointment with the Cancer Center please come in thru the  Main Entrance and check in at the main information desk  You need to re-schedule your appointment should you arrive 10 or more minutes late.  We strive to give you quality time with our providers, and arriving late affects you and other patients whose appointments are after yours.  Also, if you no show three or more times for appointments you may be dismissed from the clinic at the providers discretion.     Again, thank you for choosing [facility].  Our hope is that these requests will decrease the amount of time that you wait before being seen by our physicians.       _____________________________________________________________  Should you have questions after your visit to [facility], please contact our office at [phone] between the hours of 8:30 a.m. and 4:30 p.m.  Voicemails left after 4:30 p.m. will not be returned until the following business day.  For prescription refill requests, have your pharmacy contact our office.       Resources For Cancer Patients and their Caregivers ? American Cancer Society: Can assist with transportation, wigs, general needs, runs Look Good Feel Better.        1-888-227-6333 ? Cancer Care: Provides financial assistance, online support groups, medication/co-pay assistance.  1-800-813-HOPE (4673) ? [facility] Assists [place] cancer patients and their families through emotional , educational and financial support.  336-427-4357 ? [place] DSS Where to apply for food  stamps, Medicaid and utility assistance. 336-342-1394 ? RCATS: Transportation to medical appointments. 336-347-2287 ? Social Security Administration: May apply for disability if have a Stage IV cancer. 336-342-7796 1-800-772-1213 ? [place] Aging, Disability and Transit Services: Assists with nutrition, care and transit needs. 336-349-2343  Cancer Center Support Programs:   > Cancer Support Group  2nd Tuesday of the month 1pm-2pm, Journey Room   > Creative Journey  3rd Tuesday of the month 1130am-1pm, Journey Room    

## 2017-09-11 NOTE — Progress Notes (Signed)
CRITICAL VALUE ALERT Critical value received:  WBC- 0.4 Date of notification:  09/11/17 Time of notification: 5449 Critical value read back:  Yes.   Nurse who received alert:  M.Junior Huezo, LPN MD notified (1st page):  Tomie China, MD  CRITICAL VALUE ALERT Critical value received:  Platelets-20 Date of notification:  09/11/17 Time of notification: 2010 Critical value read back:  Yes.   Nurse who received alert:  M.Duy Lemming, LPN MD notified (1st page):  Tomie China, MD

## 2017-09-11 NOTE — Progress Notes (Signed)
Patient to treatment room after oncology follow up for one unit of platelets.  Patient stated prn red blood noted with  Bowel movements three days ago but not has noticed any afterwards.  Stated weakness but able to perform ADL's without difficulty.  Family at side.    PICC line dressing changed and flushed per protocol.  Insertion site clean and dry with no drainage or redness noted.  Denied fevers or chills.

## 2017-09-12 ENCOUNTER — Other Ambulatory Visit (HOSPITAL_COMMUNITY): Payer: Self-pay | Admitting: Pharmacist

## 2017-09-12 ENCOUNTER — Ambulatory Visit (HOSPITAL_COMMUNITY): Payer: Medicare HMO

## 2017-09-12 LAB — PREPARE PLATELET PHERESIS: UNIT DIVISION: 0

## 2017-09-12 LAB — BPAM PLATELET PHERESIS
Blood Product Expiration Date: 201905292359
ISSUE DATE / TIME: 201905281343
UNIT TYPE AND RH: 6200

## 2017-09-13 ENCOUNTER — Encounter (HOSPITAL_COMMUNITY): Payer: Self-pay

## 2017-09-13 ENCOUNTER — Inpatient Hospital Stay (HOSPITAL_COMMUNITY): Payer: Medicare HMO

## 2017-09-13 ENCOUNTER — Ambulatory Visit (HOSPITAL_COMMUNITY): Payer: Medicare HMO

## 2017-09-13 VITALS — BP 130/68 | HR 87 | Temp 98.1°F | Resp 18

## 2017-09-13 DIAGNOSIS — D469 Myelodysplastic syndrome, unspecified: Secondary | ICD-10-CM

## 2017-09-13 DIAGNOSIS — C9200 Acute myeloblastic leukemia, not having achieved remission: Secondary | ICD-10-CM

## 2017-09-13 DIAGNOSIS — Z5111 Encounter for antineoplastic chemotherapy: Secondary | ICD-10-CM | POA: Diagnosis not present

## 2017-09-13 LAB — CBC WITH DIFFERENTIAL/PLATELET
BASOS PCT: 0 %
Basophils Absolute: 0 10*3/uL (ref 0.0–0.1)
EOS ABS: 0 10*3/uL (ref 0.0–0.7)
Eosinophils Relative: 0 %
HEMATOCRIT: 26.7 % — AB (ref 39.0–52.0)
Hemoglobin: 9.2 g/dL — ABNORMAL LOW (ref 13.0–17.0)
Lymphocytes Relative: 89 %
Lymphs Abs: 0.4 10*3/uL (ref 0.7–4.0)
MCH: 30.4 pg (ref 26.0–34.0)
MCHC: 34.5 g/dL (ref 30.0–36.0)
MCV: 88.1 fL (ref 78.0–100.0)
MONO ABS: 0 10*3/uL (ref 0.1–1.0)
MONOS PCT: 9 %
NEUTROS ABS: 0 10*3/uL (ref 1.7–7.7)
Neutrophils Relative %: 2 %
Platelets: 34 10*3/uL — ABNORMAL LOW (ref 150–400)
RBC: 3.03 MIL/uL — ABNORMAL LOW (ref 4.22–5.81)
RDW: 13.3 % (ref 11.5–15.5)
WBC: 0.4 10*3/uL — CL (ref 4.0–10.5)

## 2017-09-13 MED ORDER — SODIUM CHLORIDE 0.9% FLUSH
10.0000 mL | Freq: Once | INTRAVENOUS | Status: AC
  Administered 2017-09-13: 10 mL via INTRAVENOUS

## 2017-09-13 MED ORDER — HEPARIN SOD (PORK) LOCK FLUSH 100 UNIT/ML IV SOLN
INTRAVENOUS | Status: AC
  Filled 2017-09-13: qty 5

## 2017-09-13 MED ORDER — HEPARIN SOD (PORK) LOCK FLUSH 100 UNIT/ML IV SOLN
500.0000 [IU] | Freq: Once | INTRAVENOUS | Status: AC
  Administered 2017-09-13: 500 [IU] via INTRAVENOUS

## 2017-09-13 NOTE — Patient Instructions (Signed)
[facility] at [facility]  Discharge Instructions:   _______________________________________________________________  Thank you for choosing [facility] at [facility] to provide your oncology and hematology care.  To afford each patient quality time with our providers, please arrive at least 15 minutes before your scheduled appointment.  You need to re-schedule your appointment if you arrive 10 or more minutes late.  We strive to give you quality time with our providers, and arriving late affects you and other patients whose appointments are after yours.  Also, if you no show three or more times for appointments you may be dismissed from the clinic.  Again, thank you for choosing [facility] at [facility]. Our hope is that these requests will allow you access to exceptional care and in a timely manner. _______________________________________________________________  If you have questions after your visit, please contact our office at [phone] between the hours of 8:30 a.m. and 5:00 p.m. Voicemails left after 4:30 p.m. will not be returned until the following business day. _______________________________________________________________  For prescription refill requests, have your pharmacy contact our office. _______________________________________________________________  Recommendations made by the consultant and any test results will be sent to your referring physician. _______________________________________________________________ 

## 2017-09-13 NOTE — Progress Notes (Signed)
Patient for lab draw for possible transfusion.  Dressing clean and dry with no redness or complaints of pain with insertion site for PICC line.  Flushed easily with blood return noted.    Patients labs met [facility] protocol for no transfusion today.  Copies given to the patient and left ambulatory with no s/s of distress noted.

## 2017-09-13 NOTE — Progress Notes (Signed)
CRITICAL VALUE ALERT  Critical Value:  WBC 0.4  Date & Time Notied:  09/13/17 @ 1005  Provider Notified: Dr. Delton Coombes  Orders Received/Actions taken: No action needed per orders from provider at [facility].

## 2017-09-14 ENCOUNTER — Ambulatory Visit (HOSPITAL_COMMUNITY): Payer: Medicare HMO

## 2017-09-17 ENCOUNTER — Encounter (HOSPITAL_COMMUNITY): Payer: Self-pay

## 2017-09-17 ENCOUNTER — Inpatient Hospital Stay (HOSPITAL_COMMUNITY): Payer: Medicare HMO | Attending: Oncology

## 2017-09-17 ENCOUNTER — Other Ambulatory Visit: Payer: Self-pay

## 2017-09-17 ENCOUNTER — Ambulatory Visit (HOSPITAL_COMMUNITY): Payer: Medicare HMO

## 2017-09-17 VITALS — BP 116/42 | HR 72 | Temp 98.6°F | Resp 16 | Wt 179.0 lb

## 2017-09-17 DIAGNOSIS — C9200 Acute myeloblastic leukemia, not having achieved remission: Secondary | ICD-10-CM | POA: Insufficient documentation

## 2017-09-17 DIAGNOSIS — Z5111 Encounter for antineoplastic chemotherapy: Secondary | ICD-10-CM | POA: Insufficient documentation

## 2017-09-17 LAB — COMPREHENSIVE METABOLIC PANEL
ALBUMIN: 3.9 g/dL (ref 3.5–5.0)
ALT: 31 U/L (ref 17–63)
ANION GAP: 8 (ref 5–15)
AST: 21 U/L (ref 15–41)
Alkaline Phosphatase: 105 U/L (ref 38–126)
BUN: 22 mg/dL — AB (ref 6–20)
CHLORIDE: 106 mmol/L (ref 101–111)
CO2: 26 mmol/L (ref 22–32)
Calcium: 9.1 mg/dL (ref 8.9–10.3)
Creatinine, Ser: 1.03 mg/dL (ref 0.61–1.24)
GFR calc Af Amer: >60 mL/min (ref >60)
GFR calc non Af Amer: >60 mL/min (ref >60)
GLUCOSE: 93 mg/dL (ref 65–99)
POTASSIUM: 3.9 mmol/L (ref 3.5–5.1)
Sodium: 140 mmol/L (ref 135–145)
Total Bilirubin: 0.7 mg/dL (ref 0.3–1.2)
Total Protein: 6.6 g/dL (ref 6.5–8.1)

## 2017-09-17 LAB — CBC WITH DIFFERENTIAL/PLATELET
BASOS ABS: 0 10*3/uL (ref 0.0–0.1)
Basophils Relative: 0 %
EOS PCT: 0 %
Eosinophils Absolute: 0 10*3/uL (ref 0.0–0.7)
HEMATOCRIT: 23.7 % — AB (ref 39.0–52.0)
Hemoglobin: 8.2 g/dL — ABNORMAL LOW (ref 13.0–17.0)
LYMPHS ABS: 0.5 10*3/uL — AB (ref 0.7–4.0)
LYMPHS PCT: 80 %
MCH: 30.5 pg (ref 26.0–34.0)
MCHC: 34.6 g/dL (ref 30.0–36.0)
MCV: 88.1 fL (ref 78.0–100.0)
MONOS PCT: 13 %
Monocytes Absolute: 0.1 10*3/uL (ref 0.1–1.0)
Neutro Abs: 0 10*3/uL — ABNORMAL LOW (ref 1.7–7.7)
Neutrophils Relative %: 7 %
PLATELETS: 62 10*3/uL — AB (ref 150–400)
RBC: 2.69 MIL/uL — ABNORMAL LOW (ref 4.22–5.81)
RDW: 13.2 % (ref 11.5–15.5)
WBC: 0.6 10*3/uL — AB (ref 4.0–10.5)

## 2017-09-17 LAB — PREPARE RBC (CROSSMATCH)

## 2017-09-17 LAB — MAGNESIUM: MAGNESIUM: 1.8 mg/dL (ref 1.7–2.4)

## 2017-09-17 MED ORDER — DIPHENHYDRAMINE HCL 25 MG PO CAPS
ORAL_CAPSULE | ORAL | Status: AC
  Filled 2017-09-17: qty 1

## 2017-09-17 MED ORDER — TRAMADOL HCL 50 MG PO TABS: ORAL_TABLET | ORAL | 0 refills | Status: DC

## 2017-09-17 MED ORDER — DIPHENHYDRAMINE HCL 25 MG PO TABS
25.0000 mg | ORAL_TABLET | Freq: Once | ORAL | Status: AC
  Administered 2017-09-17: 25 mg via ORAL

## 2017-09-17 MED ORDER — TRAMADOL HCL 50 MG PO TABS: 100.0000 mg | ORAL_TABLET | Freq: Two times a day (BID) | ORAL | 0 refills | Status: DC

## 2017-09-17 MED ORDER — SODIUM CHLORIDE 0.9% FLUSH
10.0000 mL | INTRAVENOUS | Status: AC | PRN
  Administered 2017-09-17: 10 mL

## 2017-09-17 MED ORDER — SODIUM CHLORIDE 0.9 % IV SOLN
250.0000 mL | Freq: Once | INTRAVENOUS | Status: AC
  Administered 2017-09-17: 250 mL via INTRAVENOUS

## 2017-09-17 MED ORDER — ACETAMINOPHEN 325 MG PO TABS
650.0000 mg | ORAL_TABLET | Freq: Once | ORAL | Status: AC
  Administered 2017-09-17: 650 mg via ORAL

## 2017-09-17 MED ORDER — ACETAMINOPHEN 325 MG PO TABS
ORAL_TABLET | ORAL | Status: AC
  Filled 2017-09-17: qty 2

## 2017-09-17 MED ORDER — HEPARIN SOD (PORK) LOCK FLUSH 100 UNIT/ML IV SOLN: 250.0000 [IU] | INTRAVENOUS | Status: DC | PRN

## 2017-09-17 MED ORDER — TRAMADOL HCL ER 100 MG PO TB24: ORAL_TABLET | ORAL | 0 refills | Status: DC

## 2017-09-17 NOTE — Patient Instructions (Signed)
[facility] at [facility] Discharge Instructions  One unit of blood given today Follow up as scheduled.   Thank you for choosing [facility] at [facility] to provide your oncology and hematology care.  To afford each patient quality time with our provider, please arrive at least 15 minutes before your scheduled appointment time.   If you have a lab appointment with the Cancer Center please come in thru the  Main Entrance and check in at the main information desk  You need to re-schedule your appointment should you arrive 10 or more minutes late.  We strive to give you quality time with our providers, and arriving late affects you and other patients whose appointments are after yours.  Also, if you no show three or more times for appointments you may be dismissed from the clinic at the providers discretion.     Again, thank you for choosing [facility].  Our hope is that these requests will decrease the amount of time that you wait before being seen by our physicians.       _____________________________________________________________  Should you have questions after your visit to  Cancer Center, please contact our office at [phone] between the hours of 8:30 a.m. and 4:30 p.m.  Voicemails left after 4:30 p.m. will not be returned until the following business day.  For prescription refill requests, have your pharmacy contact our office.       Resources For Cancer Patients and their Caregivers ? American Cancer Society: Can assist with transportation, wigs, general needs, runs Look Good Feel Better.        1-888-227-6333 ? Cancer Care: Provides financial assistance, online support groups, medication/co-pay assistance.  1-800-813-HOPE (4673) ? [facility] Assists [place] cancer patients and their families through emotional , educational and financial support.  336-427-4357 ? [place] DSS Where to apply for food stamps, Medicaid and utility assistance. 336-342-1394 ? RCATS: Transportation to medical appointments. 336-347-2287 ? Social Security Administration: May apply for disability if have a Stage IV cancer. 336-342-7796 1-800-772-1213 ? [place] Aging, Disability and Transit Services: Assists with nutrition, care and transit needs. 336-349-2343  Cancer Center Support Programs:   > Cancer Support Group  2nd Tuesday of the month 1pm-2pm, Journey Room   > Creative Journey  3rd Tuesday of the month 1130am-1pm, Journey Room    

## 2017-09-17 NOTE — Progress Notes (Signed)
CRITICAL VALUE ALERT Critical value received:  Wbc-0.6 Date of notification:  09/17/17 Time of notification: 0923 Critical value read back:  Yes.   Nurse who received alert:  M.Quinto Tippy, LPN MD notified (1st page):  Tomie China, MD

## 2017-09-18 LAB — BPAM RBC
Blood Product Expiration Date: 201906042359
ISSUE DATE / TIME: 201906031204
UNIT TYPE AND RH: 5100

## 2017-09-18 LAB — TYPE AND SCREEN
ABO/RH(D): A POS
ANTIBODY SCREEN: NEGATIVE
Unit division: 0

## 2017-09-20 ENCOUNTER — Encounter (HOSPITAL_COMMUNITY): Payer: Self-pay

## 2017-09-20 ENCOUNTER — Inpatient Hospital Stay (HOSPITAL_COMMUNITY): Payer: Medicare HMO

## 2017-09-20 ENCOUNTER — Other Ambulatory Visit: Payer: Self-pay

## 2017-09-20 DIAGNOSIS — C9200 Acute myeloblastic leukemia, not having achieved remission: Secondary | ICD-10-CM | POA: Diagnosis not present

## 2017-09-20 DIAGNOSIS — Z5111 Encounter for antineoplastic chemotherapy: Secondary | ICD-10-CM | POA: Diagnosis not present

## 2017-09-20 LAB — CBC WITH DIFFERENTIAL/PLATELET
Basophils Absolute: 0 10*3/uL (ref 0.0–0.1)
Basophils Relative: 0 %
EOS ABS: 0 10*3/uL (ref 0.0–0.7)
EOS PCT: 0 %
HCT: 26.2 % — ABNORMAL LOW (ref 39.0–52.0)
Hemoglobin: 9 g/dL — ABNORMAL LOW (ref 13.0–17.0)
LYMPHS PCT: 76 %
Lymphs Abs: 0.4 10*3/uL (ref 0.7–4.0)
MCH: 30.2 pg (ref 26.0–34.0)
MCHC: 34.4 g/dL (ref 30.0–36.0)
MCV: 87.9 fL (ref 78.0–100.0)
MONOS PCT: 10 %
Monocytes Absolute: 0.1 10*3/uL (ref 0.1–1.0)
Neutro Abs: 0.1 10*3/uL (ref 1.7–7.7)
Neutrophils Relative %: 14 %
PLATELETS: 87 10*3/uL — AB (ref 150–400)
RBC: 2.98 MIL/uL — AB (ref 4.22–5.81)
RDW: 13.3 % (ref 11.5–15.5)
WBC: 0.5 10*3/uL — AB (ref 4.0–10.5)

## 2017-09-20 LAB — COMPREHENSIVE METABOLIC PANEL
ALBUMIN: 3.9 g/dL (ref 3.5–5.0)
ALT: 28 U/L (ref 17–63)
AST: 20 U/L (ref 15–41)
Alkaline Phosphatase: 105 U/L (ref 38–126)
Anion gap: 8 (ref 5–15)
BUN: 20 mg/dL (ref 6–20)
CHLORIDE: 101 mmol/L (ref 101–111)
CO2: 27 mmol/L (ref 22–32)
CREATININE: 1.04 mg/dL (ref 0.61–1.24)
Calcium: 9.2 mg/dL (ref 8.9–10.3)
GFR calc Af Amer: >60 mL/min (ref >60)
GFR calc non Af Amer: >60 mL/min (ref >60)
GLUCOSE: 103 mg/dL — AB (ref 65–99)
POTASSIUM: 4.2 mmol/L (ref 3.5–5.1)
Sodium: 136 mmol/L (ref 135–145)
Total Bilirubin: 0.8 mg/dL (ref 0.3–1.2)
Total Protein: 6.4 g/dL — ABNORMAL LOW (ref 6.5–8.1)

## 2017-09-20 LAB — MAGNESIUM: Magnesium: 1.9 mg/dL (ref 1.7–2.4)

## 2017-09-20 NOTE — Progress Notes (Signed)
CRITICAL VALUE ALERT Critical value received:  WBC-0.5  Date of notification:  09/20/17 Time of notification: 0910 Critical value read back:  Yes.   Nurse who received alert:  M.Lemuel Boodram, LPN  MD notified (1st page):  S.Katragadda, MD

## 2017-09-20 NOTE — Progress Notes (Signed)
Omar Lee presented for PICC flush and dressing change.  See IV assessment in docflowsheets for PICC details. PICC located right arm.  Good blood return present. PICC flushed with 40ml NS; see MAR for further details.  Omar Lee tolerated procedure well and without incident.  Discharged ambulatory.

## 2017-09-22 ENCOUNTER — Other Ambulatory Visit: Payer: Self-pay | Admitting: Family Medicine

## 2017-09-24 ENCOUNTER — Inpatient Hospital Stay (HOSPITAL_COMMUNITY): Payer: Medicare HMO

## 2017-09-24 ENCOUNTER — Inpatient Hospital Stay (HOSPITAL_BASED_OUTPATIENT_CLINIC_OR_DEPARTMENT_OTHER): Payer: Medicare HMO | Admitting: Hematology

## 2017-09-24 ENCOUNTER — Encounter (HOSPITAL_COMMUNITY): Payer: Self-pay | Admitting: Hematology

## 2017-09-24 ENCOUNTER — Other Ambulatory Visit: Payer: Self-pay

## 2017-09-24 VITALS — BP 131/58 | HR 63 | Temp 97.6°F | Resp 18

## 2017-09-24 DIAGNOSIS — C9200 Acute myeloblastic leukemia, not having achieved remission: Secondary | ICD-10-CM

## 2017-09-24 DIAGNOSIS — Z5111 Encounter for antineoplastic chemotherapy: Secondary | ICD-10-CM | POA: Diagnosis not present

## 2017-09-24 DIAGNOSIS — D464 Refractory anemia, unspecified: Secondary | ICD-10-CM

## 2017-09-24 LAB — CBC WITH DIFFERENTIAL/PLATELET
BASOS PCT: 0 %
Basophils Absolute: 0 10*3/uL (ref 0.0–0.1)
Eosinophils Absolute: 0 10*3/uL (ref 0.0–0.7)
Eosinophils Relative: 0 %
HCT: 24.7 % — ABNORMAL LOW (ref 39.0–52.0)
Hemoglobin: 8.4 g/dL — ABNORMAL LOW (ref 13.0–17.0)
LYMPHS PCT: 64 %
Lymphs Abs: 0.3 10*3/uL — ABNORMAL LOW (ref 0.7–4.0)
MCH: 30.3 pg (ref 26.0–34.0)
MCHC: 34 g/dL (ref 30.0–36.0)
MCV: 89.2 fL (ref 78.0–100.0)
MONO ABS: 0.1 10*3/uL (ref 0.1–1.0)
Monocytes Relative: 11 %
NEUTROS PCT: 25 %
Neutro Abs: 0.2 10*3/uL — ABNORMAL LOW (ref 1.7–7.7)
PLATELETS: 117 10*3/uL — AB (ref 150–400)
RBC: 2.77 MIL/uL — AB (ref 4.22–5.81)
RDW: 13.4 % (ref 11.5–15.5)
WBC: 0.6 10*3/uL — AB (ref 4.0–10.5)

## 2017-09-24 LAB — URIC ACID: URIC ACID, SERUM: 3.6 mg/dL — AB (ref 4.4–7.6)

## 2017-09-24 LAB — COMPREHENSIVE METABOLIC PANEL
ALT: 26 U/L (ref 17–63)
ANION GAP: 8 (ref 5–15)
AST: 19 U/L (ref 15–41)
Albumin: 3.7 g/dL (ref 3.5–5.0)
Alkaline Phosphatase: 98 U/L (ref 38–126)
BUN: 17 mg/dL (ref 6–20)
CO2: 27 mmol/L (ref 22–32)
CREATININE: 0.96 mg/dL (ref 0.61–1.24)
Calcium: 9.1 mg/dL (ref 8.9–10.3)
Chloride: 105 mmol/L (ref 101–111)
Glucose, Bld: 111 mg/dL — ABNORMAL HIGH (ref 65–99)
Potassium: 3.9 mmol/L (ref 3.5–5.1)
SODIUM: 140 mmol/L (ref 135–145)
Total Bilirubin: 0.8 mg/dL (ref 0.3–1.2)
Total Protein: 6.3 g/dL — ABNORMAL LOW (ref 6.5–8.1)

## 2017-09-24 LAB — MAGNESIUM: MAGNESIUM: 1.7 mg/dL (ref 1.7–2.4)

## 2017-09-24 LAB — LACTATE DEHYDROGENASE: LDH: 101 U/L (ref 98–192)

## 2017-09-24 MED ORDER — SODIUM CHLORIDE 0.9 % IV SOLN
Freq: Once | INTRAVENOUS | Status: AC
  Administered 2017-09-24: 11:00:00 via INTRAVENOUS

## 2017-09-24 MED ORDER — PROCHLORPERAZINE MALEATE 10 MG PO TABS
10.0000 mg | ORAL_TABLET | Freq: Once | ORAL | Status: AC
  Administered 2017-09-24: 10 mg via ORAL
  Filled 2017-09-24: qty 1

## 2017-09-24 MED ORDER — SODIUM CHLORIDE 0.9 % IV SOLN
20.0000 mg/m2 | Freq: Once | INTRAVENOUS | Status: AC
  Administered 2017-09-24: 40 mg via INTRAVENOUS
  Filled 2017-09-24: qty 8

## 2017-09-24 MED ORDER — HEPARIN SOD (PORK) LOCK FLUSH 100 UNIT/ML IV SOLN
500.0000 [IU] | Freq: Once | INTRAVENOUS | Status: DC | PRN
  Filled 2017-09-24: qty 5

## 2017-09-24 MED ORDER — SODIUM CHLORIDE 0.9% FLUSH
10.0000 mL | INTRAVENOUS | Status: DC | PRN
Start: 2017-09-24 — End: 2017-12-25
  Administered 2017-09-24: 10 mL
  Filled 2017-09-24: qty 10

## 2017-09-24 NOTE — Progress Notes (Unsigned)
CRITICAL VALUE ALERT Critical value received:  WBC 0.6 Date of notification:  09-24-2017 Time of notification: 0907 Critical value read back:  Yes.   Nurse who received alert:  C.Page RN MD notified (1st page): [phone]    Labs reviewed with patient and seen by Dr. Delton Coombes today.  Proceed with treatment.  Hemoglobin 8.4 today. Patient stated" he did not want a blood transfusion, He felt ok."  Treatment given per orders. Patient tolerated it well without problems. Vitals stable and discharged home from clinic ambulatory. Follow up as scheduled.

## 2017-09-24 NOTE — Assessment & Plan Note (Signed)
1.  AML with myelodysplasia related changes:  - Cycle 1 of decitabine 20 mg/m2 IV x5 days, along with venetoclax 400 mg daily started on 07/13/2017 - bone marrow on 08/07/2017 shows decrease in bone marrow blasts to around 36%.  Initial biopsy showed 86% blasts. - Cycle 2 decitabine on 08/12/2017, requiring weekly transfusions.  He had bone marrow biopsy done on 09/04/2017 which shows hypocellular with no excess blasts. - His cycle 3 was delayed by 2 weeks.  Today his platelet count improved to 117.  However his white count is low at 0.6.  As per recommendations from Dr. Florene Glen, we will proceed with cycle 3 of decitabine without any dose modifications.  His venetoclax was reduced to 200 mg daily which he will start today.  We will monitor his labs closely.  2.  Access: He has a PICC line in the right forearm placed at [facility].  The PICC line site shows no infection or bleeding.  3.  ID: There is no indication for prophylactic antibiotics at this time.

## 2017-09-24 NOTE — Patient Instructions (Signed)
[facility] Discharge Instructions for Patients Receiving Chemotherapy   Beginning January 23rd 2017 lab work for the Cancer Center will be done in the  Main lab at [facility] on 1st floor. If you have a lab appointment with the Cancer Center please come in thru the  Main Entrance and check in at the main information desk   Today you received the following chemotherapy agents   To help prevent nausea and vomiting after your treatment, we encourage you to take your nausea medication     If you develop nausea and vomiting, or diarrhea that is not controlled by your medication, call the clinic.  The clinic phone number is [phone]. Office hours are Monday-Friday 8:30am-5:00pm.  BELOW ARE SYMPTOMS THAT SHOULD BE REPORTED IMMEDIATELY:  *FEVER GREATER THAN 101.0 F  *CHILLS WITH OR WITHOUT FEVER  NAUSEA AND VOMITING THAT IS NOT CONTROLLED WITH YOUR NAUSEA MEDICATION  *UNUSUAL SHORTNESS OF BREATH  *UNUSUAL BRUISING OR BLEEDING  TENDERNESS IN MOUTH AND THROAT WITH OR WITHOUT PRESENCE OF ULCERS  *URINARY PROBLEMS  *BOWEL PROBLEMS  UNUSUAL RASH Items with * indicate a potential emergency and should be followed up as soon as possible. If you have an emergency after office hours please contact your primary care physician or go to the nearest emergency department.  Please call the clinic during office hours if you have any questions or concerns.   You may also contact the Patient Navigator at [phone] should you have any questions or need assistance in obtaining follow up care.      Resources For Cancer Patients and their Caregivers ? American Cancer Society: Can assist with transportation, wigs, general needs, runs Look Good Feel Better.        1-888-227-6333 ? Cancer Care: Provides financial assistance, online support groups, medication/co-pay assistance.  1-800-813-HOPE (4673) ? [facility] Assists [place] cancer  patients and their families through emotional , educational and financial support.  336-427-4357 ? [place] DSS Where to apply for food stamps, Medicaid and utility assistance. 336-342-1394 ? RCATS: Transportation to medical appointments. 336-347-2287 ? Social Security Administration: May apply for disability if have a Stage IV cancer. 336-342-7796 1-800-772-1213 ? [place] Aging, Disability and Transit Services: Assists with nutrition, care and transit needs. 336-349-2343         

## 2017-09-24 NOTE — Progress Notes (Signed)
[facility], [place] [address]   CLINIC:  Medical Oncology/Hematology  PCP:  [name], [place] [address] [phone]   REASON FOR VISIT:  Follow-up for AML.  CURRENT THERAPY: Decitabine and venetoclax.  BRIEF ONCOLOGIC HISTORY:    AML (acute myeloblastic leukemia) ([facility])   07/2016 Initial Diagnosis    MDS- BM BX with 8% blasts.  Cytogenetics with minus Y only.  IPSS-R 3.5, intermediate risk (0 for cyto, 2.0 for blasts, 1.5 for Hb, 0 for platelets, 0 for ANC).      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  FLT3-ITD/TKD negative.  AML panel positive for NRAS,STAG2,TET2, U2AF1, DNMT3A      07/13/2017 -  Chemotherapy    C1 decitabine 20 mg/m2 IV daily x5 days with venetoclax 400 mg daily.  Given Hydrea prior to starting when it collects due to leukocytosis.        CANCER STAGING: Cancer Staging No matching staging information was found for the patient.   INTERVAL HISTORY:  Mr. Bischof 77 y.o. male returns for routine follow-up and consideration for next cycle of chemotherapy.  He is due for cycle 3 of decitabine.  We have been holding it for the last 2 weeks because of cytopenias.  He reports having fatigue.  He had diarrhea for 1 night as he ate some exotic food to him.  Occasional dizziness is stable.  Bruising is also stable.  No hospitalizations or serious infections noted.    REVIEW OF SYSTEMS:  Review of Systems  Constitutional: Positive for fatigue.  Gastrointestinal: Positive for diarrhea.  Neurological: Positive for dizziness.  Hematological: Bruises/bleeds easily.     PAST MEDICAL/SURGICAL HISTORY:  Past  Medical History:  Diagnosis Date  . Cancer ([place])    MDS  . Hyperlipidemia   . Hypothyroidism   . Reflux   . Sleep apnea    Past Surgical History:  Procedure Laterality Date  . ESOPHAGOGASTRODUODENOSCOPY (EGD) WITH ESOPHAGEAL DILATION N/A 11/22/2012   Procedure: ESOPHAGOGASTRODUODENOSCOPY (EGD) WITH ESOPHAGEAL DILATION;  Surgeon: Rogene Houston, MD;  Location: [facility];  Service: Endoscopy;  Laterality: N/A;  1120  . HEMORRHOID SURGERY    . NASAL SEPTUM SURGERY    . SHOULDER SURGERY Right   . TONSILLECTOMY AND ADENOIDECTOMY       SOCIAL HISTORY:  Social History   Socioeconomic History  . Marital status: Divorced    Spouse name: Not on file  . Number of children: Not on file  . Years of education: Not on file  . Highest education level: Not on file  Occupational History  . Not on file  Social Needs  . Financial resource strain: Not on file  . Food insecurity:    Worry: Not on file    Inability: Not on file  . Transportation needs:    Medical: Not on file    Non-medical: Not on file  Tobacco Use  . Smoking status: Never Smoker  . Smokeless tobacco: Never Used  Substance and Sexual Activity  . Alcohol use: No  . Drug use: No  . Sexual activity: Not Currently    Birth control/protection: None  Lifestyle  .  Physical activity:    Days per week: Not on file    Minutes per session: Not on file  . Stress: Not on file  Relationships  . Social connections:    Talks on phone: Not on file    Gets together: Not on file    Attends religious service: Not on file    Active member of club or organization: Not on file    Attends meetings of clubs or organizations: Not on file    Relationship status: Not on file  . Intimate partner violence:    Fear of current or ex partner: Not on file    Emotionally abused: Not on file    Physically abused: Not on file    Forced sexual activity: Not on file  Other Topics Concern  . Not on file  Social History Narrative  . Not on  file    FAMILY HISTORY:  Family History  Problem Relation Age of Onset  . Hypertension Mother   . Heart attack Mother   . Heart attack Father   . Diabetes Brother     CURRENT MEDICATIONS:  Outpatient Encounter Medications as of 09/24/2017  Medication Sig Note  . acetaminophen (TYLENOL) 325 MG tablet Take 650 mg by mouth as needed.   Marland Kitchen allopurinol (ZYLOPRIM) 300 MG tablet Take 1 tablet (300 mg total) by mouth daily.   Marland Kitchen doxycycline (VIBRA-TABS) 100 MG tablet    . fluconazole (DIFLUCAN) 200 MG tablet    . hydroxyurea (HYDREA) 500 MG capsule Take 1 capsule (500 mg total) by mouth 2 (two) times daily. May take with food to minimize GI side effects.   Marland Kitchen levofloxacin (LEVAQUIN) 500 MG tablet    . levothyroxine (SYNTHROID, LEVOTHROID) 112 MCG tablet Take 1 tablet (112 mcg total) by mouth daily.   Marland Kitchen levothyroxine (SYNTHROID, LEVOTHROID) 125 MCG tablet TAKE 1 TABLET DAILY (NEED OFFICE VISIT)   . pantoprazole (PROTONIX) 40 MG tablet    . traMADol (ULTRAM) 50 MG tablet Take 100 mg by mouth 2 (two) times daily.   . VENCLEXTA 100 MG TABS  09/11/2017: Per Dr. Florene Glen stop 09/09/17 until further notice   Facility-Administered Encounter Medications as of 09/24/2017  Medication  . 0.9 %  sodium chloride infusion  . acetaminophen (TYLENOL) tablet 650 mg  . diphenhydrAMINE (BENADRYL) capsule 25 mg  . sodium chloride flush (NS) 0.9 % injection 10 mL  . sodium chloride flush (NS) 0.9 % injection 10 mL  . sodium chloride flush (NS) 0.9 % injection 10 mL    ALLERGIES:  No Known Allergies   PHYSICAL EXAM:  ECOG Performance status: 1  Vitals:   09/24/17 0859  BP: (!) 156/62  Pulse: 77  Resp: 16  Temp: 98.2 F (36.8 C)  SpO2: 100%   Filed Weights   09/24/17 0859  Weight: 181 lb (82.1 kg)    Physical Exam HEENT: No thrush or mucositis. Chest: Bilaterally clear to auscultation. Cardiovascular: S1-S2 regular rate and rhythm. Extremities: Right upper extremity PICC line is within normal  limits.  Lower extremity has 1+ edema. Skin: No rashes or ulcers.  Ecchymosis present.  LABORATORY DATA:  I have reviewed the labs as listed.  CBC    Component Value Date/Time   WBC 0.6 (LL) 09/24/2017 0833   RBC 2.77 (L) 09/24/2017 0833   HGB 8.4 (L) 09/24/2017 0833   HGB 6.7 (LL) 06/23/2016 0825   HCT 24.7 (L) 09/24/2017 0833   HCT 20.1 (L) 06/23/2016 0825   PLT 117 (L)  09/24/2017 0833   PLT 182 06/23/2016 0825   MCV 89.2 09/24/2017 0833   MCV 106 (H) 06/23/2016 0825   MCH 30.3 09/24/2017 0833   MCHC 34.0 09/24/2017 0833   RDW 13.4 09/24/2017 0833   RDW 16.4 (H) 06/23/2016 0825   LYMPHSABS 0.3 (L) 09/24/2017 0833   LYMPHSABS 0.9 06/23/2016 0825   MONOABS 0.1 09/24/2017 0833   EOSABS 0.0 09/24/2017 0833   EOSABS 0.0 06/23/2016 0825   BASOSABS 0.0 09/24/2017 0833   BASOSABS 0.0 06/23/2016 0825   CMP Latest Ref Rng & Units 09/24/2017 09/20/2017 09/17/2017  Glucose 65 - 99 mg/dL 111(H) 103(H) 93  BUN 6 - 20 mg/dL 17 20 22(H)  Creatinine 0.61 - 1.24 mg/dL 0.96 1.04 1.03  Sodium 135 - 145 mmol/L 140 136 140  Potassium 3.5 - 5.1 mmol/L 3.9 4.2 3.9  Chloride 101 - 111 mmol/L 105 101 106  CO2 22 - 32 mmol/L _0 Calcium 8.9 - 10.3 mg/dL 9.1 9.2 9.1  Total Protein 6.5 - 8.1 g/dL 6.3(L) 6.4(L) 6.6  Total Bilirubin 0.3 - 1.2 mg/dL 0.8 0.8 0.7  Alkaline Phos 38 - 126 U/L 98 105 105  AST 15 - 41 U/L _1 ALT 17 - 63 U/L _2 ASSESSMENT & PLAN:   AML (acute myeloblastic leukemia) ([facility]) 1.  AML with myelodysplasia related changes:  - Cycle 1 of decitabine 20 mg/m2 IV x5 days, along with venetoclax 400 mg daily started on 07/13/2017 - bone marrow on 08/07/2017 shows decrease in bone marrow blasts to around 36%.  Initial biopsy showed 86% blasts. - Cycle 2 decitabine on 08/12/2017, requiring weekly transfusions.  He had bone marrow biopsy done on 09/04/2017 which shows hypocellular with no excess blasts. - His cycle 3 was delayed by 2 weeks.  Today his platelet  count improved to 117.  However his white count is low at 0.6.  As per recommendations from Dr. Florene Glen, we will proceed with cycle 3 of decitabine without any dose modifications.  His venetoclax was reduced to 200 mg daily which he will start today.  We will monitor his labs closely.  2.  Access: He has a PICC line in the right forearm placed at [facility].  The PICC line site shows no infection or bleeding.  3.  ID: There is no indication for prophylactic antibiotics at this time.      Orders placed this encounter:  No orders of the defined types were placed in this encounter.     Derek Jack, MD [address] [phone]

## 2017-09-25 ENCOUNTER — Encounter (HOSPITAL_COMMUNITY): Payer: Self-pay

## 2017-09-25 ENCOUNTER — Inpatient Hospital Stay (HOSPITAL_COMMUNITY): Payer: Medicare HMO

## 2017-09-25 VITALS — BP 124/52 | HR 61 | Temp 98.5°F | Resp 18 | Wt 180.6 lb

## 2017-09-25 DIAGNOSIS — C9200 Acute myeloblastic leukemia, not having achieved remission: Secondary | ICD-10-CM | POA: Diagnosis not present

## 2017-09-25 DIAGNOSIS — Z5111 Encounter for antineoplastic chemotherapy: Secondary | ICD-10-CM | POA: Diagnosis not present

## 2017-09-25 MED ORDER — SODIUM CHLORIDE 0.9% FLUSH
3.0000 mL | INTRAVENOUS | Status: DC | PRN
  Administered 2017-09-25: 3 mL
  Filled 2017-09-25: qty 10

## 2017-09-25 MED ORDER — HEPARIN SOD (PORK) LOCK FLUSH 100 UNIT/ML IV SOLN: 250.0000 [IU] | Freq: Once | INTRAVENOUS | Status: DC | PRN

## 2017-09-25 MED ORDER — PROCHLORPERAZINE MALEATE 10 MG PO TABS
10.0000 mg | ORAL_TABLET | Freq: Once | ORAL | Status: AC
  Administered 2017-09-25: 10 mg via ORAL

## 2017-09-25 MED ORDER — SODIUM CHLORIDE 0.9 % IV SOLN
20.0000 mg/m2 | Freq: Once | INTRAVENOUS | Status: AC
  Administered 2017-09-25: 40 mg via INTRAVENOUS
  Filled 2017-09-25: qty 8

## 2017-09-25 MED ORDER — SODIUM CHLORIDE 0.9 % IV SOLN
Freq: Once | INTRAVENOUS | Status: AC
  Administered 2017-09-25: 09:00:00 via INTRAVENOUS

## 2017-09-25 MED ORDER — PROCHLORPERAZINE MALEATE 10 MG PO TABS
ORAL_TABLET | ORAL | Status: AC
  Filled 2017-09-25: qty 1

## 2017-09-25 NOTE — Progress Notes (Signed)
To treatment room for chemotherapy.  Patient taking Venclexta as directed.  No complaints with diarrhea or nausea.  Fatigue but no worsening.  No s/s of distress noted.   Patient tolerated chemotherapy with no complaints voiced.  PICC line intact with no complaints of pain verbalized by the patient.  Dressing clean and intact.  VSS with discharge and left ambulatory with no s/s of distress noted.

## 2017-09-25 NOTE — Patient Instructions (Signed)
[facility] Discharge Instructions for Patients Receiving Chemotherapy  Today you received the following chemotherapy agents decitibine.    If you develop nausea and vomiting that is not controlled by your nausea medication, call the clinic.   BELOW ARE SYMPTOMS THAT SHOULD BE REPORTED IMMEDIATELY:  *FEVER GREATER THAN 100.5 F  *CHILLS WITH OR WITHOUT FEVER  NAUSEA AND VOMITING THAT IS NOT CONTROLLED WITH YOUR NAUSEA MEDICATION  *UNUSUAL SHORTNESS OF BREATH  *UNUSUAL BRUISING OR BLEEDING  TENDERNESS IN MOUTH AND THROAT WITH OR WITHOUT PRESENCE OF ULCERS  *URINARY PROBLEMS  *BOWEL PROBLEMS  UNUSUAL RASH Items with * indicate a potential emergency and should be followed up as soon as possible.  Feel free to call the clinic should you have any questions or concerns. The clinic phone number is [phone].  Please show the [place] at check-in to the Emergency Department and triage nurse.

## 2017-09-26 ENCOUNTER — Inpatient Hospital Stay (HOSPITAL_COMMUNITY): Payer: Medicare HMO

## 2017-09-26 ENCOUNTER — Encounter (HOSPITAL_COMMUNITY): Payer: Self-pay

## 2017-09-26 VITALS — BP 112/51 | HR 74 | Temp 97.5°F | Resp 18 | Wt 179.0 lb

## 2017-09-26 DIAGNOSIS — C9200 Acute myeloblastic leukemia, not having achieved remission: Secondary | ICD-10-CM

## 2017-09-26 DIAGNOSIS — Z5111 Encounter for antineoplastic chemotherapy: Secondary | ICD-10-CM | POA: Diagnosis not present

## 2017-09-26 MED ORDER — PROCHLORPERAZINE MALEATE 10 MG PO TABS
ORAL_TABLET | ORAL | Status: AC
  Filled 2017-09-26: qty 1

## 2017-09-26 MED ORDER — SODIUM CHLORIDE 0.9 % IV SOLN
Freq: Once | INTRAVENOUS | Status: AC
  Administered 2017-09-26: 500 mL via INTRAVENOUS

## 2017-09-26 MED ORDER — PROCHLORPERAZINE MALEATE 10 MG PO TABS
10.0000 mg | ORAL_TABLET | Freq: Once | ORAL | Status: AC
  Administered 2017-09-26: 10 mg via ORAL

## 2017-09-26 MED ORDER — SODIUM CHLORIDE 0.9% FLUSH
10.0000 mL | INTRAVENOUS | Status: DC | PRN
  Administered 2017-09-26: 10 mL
  Filled 2017-09-26: qty 10

## 2017-09-26 MED ORDER — SODIUM CHLORIDE 0.9 % IV SOLN
20.0000 mg/m2 | Freq: Once | INTRAVENOUS | Status: AC
  Administered 2017-09-26: 40 mg via INTRAVENOUS
  Filled 2017-09-26: qty 8

## 2017-09-26 NOTE — Progress Notes (Signed)
Patient tolerated chemotherapy with no complaints voiced.  PICC line intact with no complaints of pain.   No bruising or swelling noted at site.   PICC lines flushed easily.  VSS with discharge and left ambulatory with no s/s of distress noted.

## 2017-09-26 NOTE — Patient Instructions (Signed)
[facility] Discharge Instructions for Patients Receiving Chemotherapy  Today you received the following chemotherapy agents decitibine.   If you develop nausea and vomiting that is not controlled by your nausea medication, call the clinic.   BELOW ARE SYMPTOMS THAT SHOULD BE REPORTED IMMEDIATELY:  *FEVER GREATER THAN 100.5 F  *CHILLS WITH OR WITHOUT FEVER  NAUSEA AND VOMITING THAT IS NOT CONTROLLED WITH YOUR NAUSEA MEDICATION  *UNUSUAL SHORTNESS OF BREATH  *UNUSUAL BRUISING OR BLEEDING  TENDERNESS IN MOUTH AND THROAT WITH OR WITHOUT PRESENCE OF ULCERS  *URINARY PROBLEMS  *BOWEL PROBLEMS  UNUSUAL RASH Items with * indicate a potential emergency and should be followed up as soon as possible.  Feel free to call the clinic should you have any questions or concerns. The clinic phone number is [phone].  Please show the [place] at check-in to the Emergency Department and triage nurse.

## 2017-09-27 ENCOUNTER — Inpatient Hospital Stay (HOSPITAL_COMMUNITY): Payer: Medicare HMO

## 2017-09-27 VITALS — BP 108/48 | HR 58 | Temp 97.8°F | Resp 18 | Wt 177.2 lb

## 2017-09-27 DIAGNOSIS — Z5111 Encounter for antineoplastic chemotherapy: Secondary | ICD-10-CM | POA: Diagnosis not present

## 2017-09-27 DIAGNOSIS — C9200 Acute myeloblastic leukemia, not having achieved remission: Secondary | ICD-10-CM | POA: Diagnosis not present

## 2017-09-27 DIAGNOSIS — D464 Refractory anemia, unspecified: Secondary | ICD-10-CM

## 2017-09-27 LAB — CBC WITH DIFFERENTIAL/PLATELET
BASOS ABS: 0 10*3/uL (ref 0.0–0.1)
BASOS PCT: 0 %
Eosinophils Absolute: 0 10*3/uL (ref 0.0–0.7)
Eosinophils Relative: 0 %
HEMATOCRIT: 25 % — AB (ref 39.0–52.0)
HEMOGLOBIN: 8.5 g/dL — AB (ref 13.0–17.0)
LYMPHS PCT: 49 %
Lymphs Abs: 0.4 10*3/uL (ref 0.7–4.0)
MCH: 30.4 pg (ref 26.0–34.0)
MCHC: 34 g/dL (ref 30.0–36.0)
MCV: 89.3 fL (ref 78.0–100.0)
Monocytes Absolute: 0 10*3/uL (ref 0.1–1.0)
Monocytes Relative: 6 %
NEUTROS ABS: 0.3 10*3/uL (ref 1.7–7.7)
NEUTROS PCT: 45 %
Platelets: 141 10*3/uL — ABNORMAL LOW (ref 150–400)
RBC: 2.8 MIL/uL — ABNORMAL LOW (ref 4.22–5.81)
RDW: 13.7 % (ref 11.5–15.5)
WBC: 0.7 10*3/uL — CL (ref 4.0–10.5)

## 2017-09-27 LAB — COMPREHENSIVE METABOLIC PANEL
ALK PHOS: 95 U/L (ref 38–126)
ALT: 25 U/L (ref 17–63)
ANION GAP: 9 (ref 5–15)
AST: 21 U/L (ref 15–41)
Albumin: 3.8 g/dL (ref 3.5–5.0)
BUN: 19 mg/dL (ref 6–20)
CALCIUM: 9.1 mg/dL (ref 8.9–10.3)
CO2: 27 mmol/L (ref 22–32)
Chloride: 102 mmol/L (ref 101–111)
Creatinine, Ser: 1.04 mg/dL (ref 0.61–1.24)
GFR calc non Af Amer: >60 mL/min (ref >60)
GLUCOSE: 108 mg/dL — AB (ref 65–99)
POTASSIUM: 3.8 mmol/L (ref 3.5–5.1)
Sodium: 138 mmol/L (ref 135–145)
TOTAL PROTEIN: 6.6 g/dL (ref 6.5–8.1)
Total Bilirubin: 1.1 mg/dL (ref 0.3–1.2)

## 2017-09-27 LAB — LACTATE DEHYDROGENASE: LDH: 93 U/L — AB (ref 98–192)

## 2017-09-27 LAB — PREPARE RBC (CROSSMATCH)

## 2017-09-27 LAB — MAGNESIUM: Magnesium: 1.7 mg/dL (ref 1.7–2.4)

## 2017-09-27 MED ORDER — DIPHENHYDRAMINE HCL 25 MG PO CAPS
25.0000 mg | ORAL_CAPSULE | Freq: Once | ORAL | Status: AC
  Administered 2017-09-27: 25 mg via ORAL

## 2017-09-27 MED ORDER — SODIUM CHLORIDE 0.9% FLUSH
10.0000 mL | INTRAVENOUS | Status: DC | PRN
  Administered 2017-09-27: 10 mL
  Filled 2017-09-27: qty 10

## 2017-09-27 MED ORDER — PROCHLORPERAZINE MALEATE 10 MG PO TABS
ORAL_TABLET | ORAL | Status: AC
  Filled 2017-09-27: qty 1

## 2017-09-27 MED ORDER — SODIUM CHLORIDE 0.9 % IV SOLN
250.0000 mL | Freq: Once | INTRAVENOUS | Status: AC
  Administered 2017-09-27: 250 mL via INTRAVENOUS

## 2017-09-27 MED ORDER — ACETAMINOPHEN 325 MG PO TABS
650.0000 mg | ORAL_TABLET | Freq: Once | ORAL | Status: AC
  Administered 2017-09-27: 650 mg via ORAL

## 2017-09-27 MED ORDER — ACETAMINOPHEN 325 MG PO TABS
ORAL_TABLET | ORAL | Status: AC
  Filled 2017-09-27: qty 2

## 2017-09-27 MED ORDER — HEPARIN SOD (PORK) LOCK FLUSH 100 UNIT/ML IV SOLN
500.0000 [IU] | Freq: Once | INTRAVENOUS | Status: AC | PRN
  Administered 2017-09-27: 500 [IU]

## 2017-09-27 MED ORDER — SODIUM CHLORIDE 0.9 % IV SOLN
Freq: Once | INTRAVENOUS | Status: AC
  Administered 2017-09-27: 500 mL via INTRAVENOUS

## 2017-09-27 MED ORDER — PROCHLORPERAZINE MALEATE 10 MG PO TABS
10.0000 mg | ORAL_TABLET | Freq: Once | ORAL | Status: AC
  Administered 2017-09-27: 10 mg via ORAL

## 2017-09-27 MED ORDER — DIPHENHYDRAMINE HCL 25 MG PO CAPS
ORAL_CAPSULE | ORAL | Status: AC
  Filled 2017-09-27: qty 1

## 2017-09-27 MED ORDER — SODIUM CHLORIDE 0.9 % IV SOLN
20.0000 mg/m2 | Freq: Once | INTRAVENOUS | Status: AC
  Administered 2017-09-27: 40 mg via INTRAVENOUS
  Filled 2017-09-27: qty 8

## 2017-09-27 NOTE — Progress Notes (Signed)
CRITICAL VALUE ALERT Critical value received:  WBC-0.7 Date of notification:  09/27/17 Time of notification: 0920 Critical value read back:  Yes.   Nurse who received alert:  M.Jihaad Bruschi, LPN MD notified (1st page):  S.Katragadda, MD

## 2017-09-27 NOTE — Patient Instructions (Signed)
[facility] Discharge Instructions for Patients Receiving Chemotherapy  Today you received the following chemotherapy agents decitibine and a blood transfusion today.    If you develop nausea and vomiting that is not controlled by your nausea medication, call the clinic.   BELOW ARE SYMPTOMS THAT SHOULD BE REPORTED IMMEDIATELY:  *FEVER GREATER THAN 100.5 F  *CHILLS WITH OR WITHOUT FEVER  NAUSEA AND VOMITING THAT IS NOT CONTROLLED WITH YOUR NAUSEA MEDICATION  *UNUSUAL SHORTNESS OF BREATH  *UNUSUAL BRUISING OR BLEEDING  TENDERNESS IN MOUTH AND THROAT WITH OR WITHOUT PRESENCE OF ULCERS  *URINARY PROBLEMS  *BOWEL PROBLEMS  UNUSUAL RASH Items with * indicate a potential emergency and should be followed up as soon as possible.  Feel free to call the clinic should you have any questions or concerns. The clinic phone number is [phone].  Please show the [place] at check-in to the Emergency Department and triage nurse.

## 2017-09-27 NOTE — Progress Notes (Signed)
Patient tolerated chemotherapy and blood transfusion with no complaints voiced.  Dressing change completed for PICC line with no complaints.  Insertion site clean and dry with no bruising or swelling noted.  No drainage.  No complaints of pain with flush and good blood return noted with both lines.  No bruising or swelling noted after treatment and transfusion.  Dressing intact.  VSS with discharge and left ambulatory with no s/s of distress noted.   Labs faxed to [facility] with confirmation received.

## 2017-09-28 ENCOUNTER — Inpatient Hospital Stay (HOSPITAL_COMMUNITY): Payer: Medicare HMO

## 2017-09-28 VITALS — BP 113/54 | HR 70 | Temp 98.9°F | Resp 18

## 2017-09-28 DIAGNOSIS — Z5111 Encounter for antineoplastic chemotherapy: Secondary | ICD-10-CM | POA: Diagnosis not present

## 2017-09-28 DIAGNOSIS — C9200 Acute myeloblastic leukemia, not having achieved remission: Secondary | ICD-10-CM

## 2017-09-28 LAB — TYPE AND SCREEN
ABO/RH(D): A POS
Antibody Screen: NEGATIVE
Unit division: 0

## 2017-09-28 LAB — BPAM RBC
BLOOD PRODUCT EXPIRATION DATE: 201907062359
ISSUE DATE / TIME: 201906131110
Unit Type and Rh: 600

## 2017-09-28 MED ORDER — LOPERAMIDE HCL 2 MG PO CAPS
ORAL_CAPSULE | ORAL | Status: AC
  Filled 2017-09-28: qty 1

## 2017-09-28 MED ORDER — PROCHLORPERAZINE MALEATE 10 MG PO TABS
10.0000 mg | ORAL_TABLET | Freq: Once | ORAL | Status: AC
  Administered 2017-09-28: 10 mg via ORAL

## 2017-09-28 MED ORDER — SODIUM CHLORIDE 0.9 % IV SOLN
Freq: Once | INTRAVENOUS | Status: AC
Start: 2017-09-28 — End: 2017-09-28
  Administered 2017-09-28: 09:00:00 via INTRAVENOUS

## 2017-09-28 MED ORDER — SODIUM CHLORIDE 0.9 % IV SOLN
20.0000 mg/m2 | Freq: Once | INTRAVENOUS | Status: AC
  Administered 2017-09-28: 40 mg via INTRAVENOUS
  Filled 2017-09-28: qty 8

## 2017-09-28 MED ORDER — SODIUM CHLORIDE 0.9% FLUSH
10.0000 mL | INTRAVENOUS | Status: DC | PRN
  Administered 2017-09-28: 10 mL
  Filled 2017-09-28: qty 10

## 2017-09-28 MED ORDER — LOPERAMIDE HCL 2 MG PO CAPS
2.0000 mg | ORAL_CAPSULE | Freq: Once | ORAL | Status: AC
  Administered 2017-09-28: 2 mg via ORAL

## 2017-09-28 MED ORDER — PROCHLORPERAZINE MALEATE 10 MG PO TABS
ORAL_TABLET | ORAL | Status: AC
  Filled 2017-09-28: qty 1

## 2017-09-28 NOTE — Progress Notes (Signed)
Patient had an episode of diarrhea this morning. He is requesting something for that. Imodium given as ordered.   Treatment given per orders. Patient tolerated it well without problems. Vitals stable and discharged home from clinic ambulatory. Follow up as scheduled.

## 2017-09-28 NOTE — Patient Instructions (Signed)
[facility] Discharge Instructions for Patients Receiving Chemotherapy   Beginning January 23rd 2017 lab work for the Cancer Center will be done in the  Main lab at [facility] on 1st floor. If you have a lab appointment with the Cancer Center please come in thru the  Main Entrance and check in at the main information desk   Today you received the following chemotherapy agents   To help prevent nausea and vomiting after your treatment, we encourage you to take your nausea medication     If you develop nausea and vomiting, or diarrhea that is not controlled by your medication, call the clinic.  The clinic phone number is [phone]. Office hours are Monday-Friday 8:30am-5:00pm.  BELOW ARE SYMPTOMS THAT SHOULD BE REPORTED IMMEDIATELY:  *FEVER GREATER THAN 101.0 F  *CHILLS WITH OR WITHOUT FEVER  NAUSEA AND VOMITING THAT IS NOT CONTROLLED WITH YOUR NAUSEA MEDICATION  *UNUSUAL SHORTNESS OF BREATH  *UNUSUAL BRUISING OR BLEEDING  TENDERNESS IN MOUTH AND THROAT WITH OR WITHOUT PRESENCE OF ULCERS  *URINARY PROBLEMS  *BOWEL PROBLEMS  UNUSUAL RASH Items with * indicate a potential emergency and should be followed up as soon as possible. If you have an emergency after office hours please contact your primary care physician or go to the nearest emergency department.  Please call the clinic during office hours if you have any questions or concerns.   You may also contact the Patient Navigator at [phone] should you have any questions or need assistance in obtaining follow up care.      Resources For Cancer Patients and their Caregivers ? American Cancer Society: Can assist with transportation, wigs, general needs, runs Look Good Feel Better.        1-888-227-6333 ? Cancer Care: Provides financial assistance, online support groups, medication/co-pay assistance.  1-800-813-HOPE (4673) ? [facility] Assists [place] cancer  patients and their families through emotional , educational and financial support.  336-427-4357 ? [place] DSS Where to apply for food stamps, Medicaid and utility assistance. 336-342-1394 ? RCATS: Transportation to medical appointments. 336-347-2287 ? Social Security Administration: May apply for disability if have a Stage IV cancer. 336-342-7796 1-800-772-1213 ? [place] Aging, Disability and Transit Services: Assists with nutrition, care and transit needs. 336-349-2343         

## 2017-10-01 ENCOUNTER — Encounter (HOSPITAL_COMMUNITY): Payer: Self-pay

## 2017-10-01 ENCOUNTER — Inpatient Hospital Stay (HOSPITAL_COMMUNITY): Payer: Medicare HMO

## 2017-10-01 DIAGNOSIS — C9200 Acute myeloblastic leukemia, not having achieved remission: Secondary | ICD-10-CM | POA: Diagnosis not present

## 2017-10-01 DIAGNOSIS — D464 Refractory anemia, unspecified: Secondary | ICD-10-CM

## 2017-10-01 DIAGNOSIS — Z5111 Encounter for antineoplastic chemotherapy: Secondary | ICD-10-CM | POA: Diagnosis not present

## 2017-10-01 LAB — COMPREHENSIVE METABOLIC PANEL
ALT: 30 U/L (ref 17–63)
ANION GAP: 9 (ref 5–15)
AST: 22 U/L (ref 15–41)
Albumin: 3.9 g/dL (ref 3.5–5.0)
Alkaline Phosphatase: 88 U/L (ref 38–126)
BUN: 22 mg/dL — ABNORMAL HIGH (ref 6–20)
CHLORIDE: 105 mmol/L (ref 101–111)
CO2: 24 mmol/L (ref 22–32)
Calcium: 9 mg/dL (ref 8.9–10.3)
Creatinine, Ser: 1.11 mg/dL (ref 0.61–1.24)
Glucose, Bld: 100 mg/dL — ABNORMAL HIGH (ref 65–99)
POTASSIUM: 3.9 mmol/L (ref 3.5–5.1)
SODIUM: 138 mmol/L (ref 135–145)
Total Bilirubin: 1.1 mg/dL (ref 0.3–1.2)
Total Protein: 6.4 g/dL — ABNORMAL LOW (ref 6.5–8.1)

## 2017-10-01 LAB — CBC WITH DIFFERENTIAL/PLATELET
Basophils Absolute: 0 10*3/uL (ref 0.0–0.1)
Basophils Relative: 0 %
EOS PCT: 0 %
Eosinophils Absolute: 0 10*3/uL (ref 0.0–0.7)
HEMATOCRIT: 25.7 % — AB (ref 39.0–52.0)
HEMOGLOBIN: 8.8 g/dL — AB (ref 13.0–17.0)
LYMPHS PCT: 31 %
Lymphs Abs: 0.3 10*3/uL — ABNORMAL LOW (ref 0.7–4.0)
MCH: 30.4 pg (ref 26.0–34.0)
MCHC: 34.2 g/dL (ref 30.0–36.0)
MCV: 88.9 fL (ref 78.0–100.0)
MONO ABS: 0 10*3/uL — AB (ref 0.1–1.0)
MONOS PCT: 3 %
NEUTROS PCT: 66 %
Neutro Abs: 0.7 10*3/uL — ABNORMAL LOW (ref 1.7–7.7)
PLATELETS: 135 10*3/uL — AB (ref 150–400)
RBC: 2.89 MIL/uL — ABNORMAL LOW (ref 4.22–5.81)
RDW: 14.6 % (ref 11.5–15.5)
WBC: 1 10*3/uL — AB (ref 4.0–10.5)

## 2017-10-01 LAB — MAGNESIUM: MAGNESIUM: 1.7 mg/dL (ref 1.7–2.4)

## 2017-10-01 MED ORDER — SODIUM CHLORIDE 0.9% FLUSH
10.0000 mL | Freq: Once | INTRAVENOUS | Status: AC
  Administered 2017-10-01: 10 mL via INTRAVENOUS

## 2017-10-01 MED ORDER — HEPARIN SOD (PORK) LOCK FLUSH 100 UNIT/ML IV SOLN
500.0000 [IU] | Freq: Once | INTRAVENOUS | Status: AC
  Administered 2017-10-01: 500 [IU] via INTRAVENOUS

## 2017-10-01 NOTE — Progress Notes (Signed)
Patient in for lab draw with PICC line.  Patient stated he had diarrhea last Friday at the [facility] with nausea and vomiting at home.  Used imodium as needed on Friday. Patient stated he did not have any problems on Saturday or Sunday.  No s/s of distress noted.  Sipping coffee and had breakfast at [facility].    Patients PICC line flushed per protocol with no complaints voice.  Good blood return noted with both lines.  Dressing intact.  No complaints of pain at insertion site and no redness or tenderness noted.  Discharge with VSS and no s/s of distress noted.

## 2017-10-01 NOTE — Patient Instructions (Signed)
[place] at [facility]  Discharge Instructions:  Your picc line was flushed with lab work today.  _______________________________________________________________  Thank you for choosing [place] at [facility] to provide your oncology and hematology care.  To afford each patient quality time with our providers, please arrive at least 15 minutes before your scheduled appointment.  You need to re-schedule your appointment if you arrive 10 or more minutes late.  We strive to give you quality time with our providers, and arriving late affects you and other patients whose appointments are after yours.  Also, if you no show three or more times for appointments you may be dismissed from the clinic.  Again, thank you for choosing [facility] at [facility] hope is that these requests will allow you access to exceptional care and in a timely manner. _______________________________________________________________  If you have questions after your visit, please contact our office at [phone] between the hours of 8:30 a.m. and 5:00 p.m. Voicemails left after 4:30 p.m. will not be returned until the following business day. _______________________________________________________________  For prescription refill requests, have your pharmacy contact our office. _______________________________________________________________  Recommendations made by the consultant and any test results will be sent to your referring physician. _______________________________________________________________

## 2017-10-01 NOTE — Progress Notes (Signed)
CRITICAL VALUE ALERT Critical value received:  WBC-1 Date of notification:  10/01/17 Time of notification: 8295 Critical value read back:  Yes.   Nurse who received alert:  M.Evan Osburn, LPN MD notified (1st page):  S.Katragadda, MD

## 2017-10-04 ENCOUNTER — Encounter (HOSPITAL_COMMUNITY): Payer: Self-pay

## 2017-10-04 ENCOUNTER — Inpatient Hospital Stay (HOSPITAL_COMMUNITY): Payer: Medicare HMO

## 2017-10-04 DIAGNOSIS — C9200 Acute myeloblastic leukemia, not having achieved remission: Secondary | ICD-10-CM | POA: Diagnosis not present

## 2017-10-04 DIAGNOSIS — D464 Refractory anemia, unspecified: Secondary | ICD-10-CM

## 2017-10-04 DIAGNOSIS — Z5111 Encounter for antineoplastic chemotherapy: Secondary | ICD-10-CM | POA: Diagnosis not present

## 2017-10-04 LAB — CBC WITH DIFFERENTIAL/PLATELET
BASOS ABS: 0 10*3/uL (ref 0.0–0.1)
Basophils Relative: 0 %
Eosinophils Absolute: 0 10*3/uL (ref 0.0–0.7)
Eosinophils Relative: 0 %
HEMATOCRIT: 25.1 % — AB (ref 39.0–52.0)
Hemoglobin: 8.4 g/dL — ABNORMAL LOW (ref 13.0–17.0)
Lymphocytes Relative: 49 %
Lymphs Abs: 0.4 10*3/uL (ref 0.7–4.0)
MCH: 30.2 pg (ref 26.0–34.0)
MCHC: 33.5 g/dL (ref 30.0–36.0)
MCV: 90.3 fL (ref 78.0–100.0)
Monocytes Absolute: 0 10*3/uL (ref 0.1–1.0)
Monocytes Relative: 4 %
NEUTROS ABS: 0.4 10*3/uL (ref 1.7–7.7)
Neutrophils Relative %: 47 %
Platelets: 101 10*3/uL — ABNORMAL LOW (ref 150–400)
RBC: 2.78 MIL/uL — AB (ref 4.22–5.81)
RDW: 15 % (ref 11.5–15.5)
WBC: 0.8 10*3/uL — AB (ref 4.0–10.5)

## 2017-10-04 LAB — COMPREHENSIVE METABOLIC PANEL
ALBUMIN: 3.8 g/dL (ref 3.5–5.0)
ALT: 40 U/L (ref 17–63)
ANION GAP: 8 (ref 5–15)
AST: 28 U/L (ref 15–41)
Alkaline Phosphatase: 80 U/L (ref 38–126)
BILIRUBIN TOTAL: 1 mg/dL (ref 0.3–1.2)
BUN: 19 mg/dL (ref 6–20)
CO2: 24 mmol/L (ref 22–32)
Calcium: 9 mg/dL (ref 8.9–10.3)
Chloride: 107 mmol/L (ref 101–111)
Creatinine, Ser: 0.97 mg/dL (ref 0.61–1.24)
GFR calc Af Amer: >60 mL/min (ref >60)
GFR calc non Af Amer: >60 mL/min (ref >60)
Glucose, Bld: 126 mg/dL — ABNORMAL HIGH (ref 65–99)
POTASSIUM: 3.7 mmol/L (ref 3.5–5.1)
Sodium: 139 mmol/L (ref 135–145)
TOTAL PROTEIN: 6.4 g/dL — AB (ref 6.5–8.1)

## 2017-10-04 LAB — MAGNESIUM: MAGNESIUM: 1.7 mg/dL (ref 1.7–2.4)

## 2017-10-04 MED ORDER — HEPARIN SOD (PORK) LOCK FLUSH 100 UNIT/ML IV SOLN
INTRAVENOUS | Status: AC
  Filled 2017-10-04: qty 5

## 2017-10-04 MED ORDER — SODIUM CHLORIDE 0.9% FLUSH
3.0000 mL | INTRAVENOUS | Status: AC | PRN
  Administered 2017-10-04: 3 mL

## 2017-10-04 MED ORDER — ACETAMINOPHEN 325 MG PO TABS
650.0000 mg | ORAL_TABLET | Freq: Once | ORAL | Status: AC
  Administered 2017-10-04: 650 mg via ORAL
  Filled 2017-10-04: qty 2

## 2017-10-04 MED ORDER — DIPHENHYDRAMINE HCL 25 MG PO CAPS
25.0000 mg | ORAL_CAPSULE | Freq: Once | ORAL | Status: AC
  Administered 2017-10-04: 25 mg via ORAL
  Filled 2017-10-04: qty 1

## 2017-10-04 MED ORDER — HEPARIN SOD (PORK) LOCK FLUSH 100 UNIT/ML IV SOLN
250.0000 [IU] | INTRAVENOUS | Status: AC | PRN
  Administered 2017-10-04: 250 [IU]
  Filled 2017-10-04: qty 5

## 2017-10-04 MED ORDER — SODIUM CHLORIDE 0.9 % IV SOLN
250.0000 mL | Freq: Once | INTRAVENOUS | Status: AC
  Administered 2017-10-04: 250 mL via INTRAVENOUS

## 2017-10-04 NOTE — Progress Notes (Signed)
0910 PICC line dressing and caps changed per protocol. PICC line site without redness or drainage.    1030 Lab results faxed to Marlowe Aschoff NP at [facility]. Pt's Hgb is 8.4 today and he is symptomatic so he will received 1 unit of PRBC's per orders from Dr. Florene Glen at [facility]

## 2017-10-04 NOTE — Patient Instructions (Signed)
[place] at [facility] Discharge Instructions  Received 1 unit of PRBC'S today and PICC line dressing and caps changed per protocol. Follow-up as scheduled. Call clinic for any questions or concerns   Thank you for choosing [place] at [facility] to provide your oncology and hematology care.  To afford each patient quality time with our provider, please arrive at least 15 minutes before your scheduled appointment time.   If you have a lab appointment with the [facility] please come in thru the  Main Entrance and check in at the main information desk  You need to re-schedule your appointment should you arrive 10 or more minutes late.  We strive to give you quality time with our providers, and arriving late affects you and other patients whose appointments are after yours.  Also, if you no show three or more times for appointments you may be dismissed from the clinic at the providers discretion.     Again, thank you for choosing [facility].  Our hope is that these requests will decrease the amount of time that you wait before being seen by our physicians.       _____________________________________________________________  Should you have questions after your visit to [facility], please contact our office at [phone] between the hours of 8:30 a.m. and 4:30 p.m.  Voicemails left after 4:30 p.m. will not be returned until the following business day.  For prescription refill requests, have your pharmacy contact our office.       Resources For Cancer Patients and their Caregivers ? American Cancer Society: Can assist with transportation, wigs, general needs, runs Look Good Feel Better.        640-171-4722 ? Cancer Care: Provides financial assistance, online support groups, medication/co-pay assistance.  1-800-813-HOPE 385-417-1232) ? [facility] Assists [place] cancer patients and  their families through emotional , educational and financial support.  (845)230-7216 ? [place] DSS Where to apply for food stamps, Medicaid and utility assistance. 203-109-6972 ? RCATS: Transportation to medical appointments. 816-204-0657 ? Social Security Administration: May apply for disability if have a Stage IV cancer. 805-059-0578 (405)183-2892 ? [employer], Disability and Transit Services: Assists with nutrition, care and transit needs. [place] Support Programs:   > Cancer Support Group  2nd Tuesday of the month 1pm-2pm, Journey Room   > Creative Journey  3rd Tuesday of the month 1130am-1pm, Journey Room

## 2017-10-04 NOTE — Progress Notes (Signed)
Patient tolerated blood transfusion with no complaints voiced.  PICC line flushed with good blood return noted and no complaints of pain with flush.  VSS with discharge and left ambulatory with no s/s of distress noted.

## 2017-10-04 NOTE — Progress Notes (Signed)
CRITICAL VALUE ALERT Critical value received:  WBC 0.8  Date of notification:  10-04-2017 Time of notification: 0945 Critical value read back:  Yes.   Nurse who received alert:  C.pageRN MD notified (1st Tru [place]):  Dr. Walden Field

## 2017-10-05 LAB — BPAM RBC
BLOOD PRODUCT EXPIRATION DATE: 201906252359
ISSUE DATE / TIME: 201906201203
UNIT TYPE AND RH: 5100

## 2017-10-05 LAB — TYPE AND SCREEN
ABO/RH(D): A POS
ANTIBODY SCREEN: NEGATIVE
UNIT DIVISION: 0

## 2017-10-08 ENCOUNTER — Inpatient Hospital Stay (HOSPITAL_COMMUNITY): Payer: Medicare HMO

## 2017-10-08 ENCOUNTER — Encounter (HOSPITAL_COMMUNITY): Payer: Self-pay

## 2017-10-08 ENCOUNTER — Other Ambulatory Visit: Payer: Self-pay

## 2017-10-08 DIAGNOSIS — D464 Refractory anemia, unspecified: Secondary | ICD-10-CM

## 2017-10-08 DIAGNOSIS — C9200 Acute myeloblastic leukemia, not having achieved remission: Secondary | ICD-10-CM | POA: Diagnosis not present

## 2017-10-08 DIAGNOSIS — Z5111 Encounter for antineoplastic chemotherapy: Secondary | ICD-10-CM | POA: Diagnosis not present

## 2017-10-08 LAB — COMPREHENSIVE METABOLIC PANEL
ALT: 55 U/L (ref 17–63)
AST: 31 U/L (ref 15–41)
Albumin: 3.8 g/dL (ref 3.5–5.0)
Alkaline Phosphatase: 77 U/L (ref 38–126)
Anion gap: 8 (ref 5–15)
BILIRUBIN TOTAL: 0.9 mg/dL (ref 0.3–1.2)
BUN: 18 mg/dL (ref 6–20)
CO2: 25 mmol/L (ref 22–32)
CREATININE: 1.05 mg/dL (ref 0.61–1.24)
Calcium: 8.9 mg/dL (ref 8.9–10.3)
Chloride: 104 mmol/L (ref 101–111)
Glucose, Bld: 133 mg/dL — ABNORMAL HIGH (ref 65–99)
POTASSIUM: 4.1 mmol/L (ref 3.5–5.1)
Sodium: 137 mmol/L (ref 135–145)
Total Protein: 6.4 g/dL — ABNORMAL LOW (ref 6.5–8.1)

## 2017-10-08 LAB — MAGNESIUM: MAGNESIUM: 1.8 mg/dL (ref 1.7–2.4)

## 2017-10-08 LAB — CBC WITH DIFFERENTIAL/PLATELET
Basophils Absolute: 0 10*3/uL (ref 0.0–0.1)
Basophils Relative: 0 %
EOS ABS: 0 10*3/uL (ref 0.0–0.7)
EOS PCT: 0 %
HCT: 27.9 % — ABNORMAL LOW (ref 39.0–52.0)
Hemoglobin: 9.1 g/dL — ABNORMAL LOW (ref 13.0–17.0)
LYMPHS ABS: 0.5 10*3/uL (ref 0.7–4.0)
LYMPHS PCT: 49 %
MCH: 30 pg (ref 26.0–34.0)
MCHC: 32.6 g/dL (ref 30.0–36.0)
MCV: 92.1 fL (ref 78.0–100.0)
MONOS PCT: 3 %
Monocytes Absolute: 0 10*3/uL (ref 0.1–1.0)
Neutro Abs: 0.5 10*3/uL (ref 1.7–7.7)
Neutrophils Relative %: 48 %
PLATELETS: 95 10*3/uL — AB (ref 150–400)
RBC: 3.03 MIL/uL — AB (ref 4.22–5.81)
RDW: 15.7 % — AB (ref 11.5–15.5)
WBC: 1 10*3/uL — CL (ref 4.0–10.5)

## 2017-10-08 NOTE — Patient Instructions (Signed)
[place] at [facility] Discharge Instructions  Labs checked today Follow up as scheduled.   Thank you for choosing [place] at [facility] to provide your oncology and hematology care.  To afford each patient quality time with our provider, please arrive at least 15 minutes before your scheduled appointment time.   If you have a lab appointment with the [place] please come in thru the  Main Entrance and check in at the main information desk  You need to re-schedule your appointment should you arrive 10 or more minutes late.  We strive to give you quality time with our providers, and arriving late affects you and other patients whose appointments are after yours.  Also, if you no show three or more times for appointments you may be dismissed from the clinic at the providers discretion.     Again, thank you for choosing [facility].  Our hope is that these requests will decrease the amount of time that you wait before being seen by our physicians.       _____________________________________________________________  Should you have questions after your visit to [facility], please contact our office at [phone] between the hours of 8:30 a.m. and 4:30 p.m.  Voicemails left after 4:30 p.m. will not be returned until the following business day.  For prescription refill requests, have your pharmacy contact our office.       Resources For Cancer Patients and their Caregivers ? American Cancer Society: Can assist with transportation, wigs, general needs, runs Look Good Feel Better.        7081430657 ? Cancer Care: Provides financial assistance, online support groups, medication/co-pay assistance.  1-800-813-HOPE 336-197-6751) ? [place] Assists [place] cancer patients and their families through emotional , educational and financial support.  559-057-6986 ? [place]  DSS Where to apply for food stamps, Medicaid and utility assistance. 772-659-7038 ? [employer]: Transportation to medical appointments. (714)853-7291 ? Social Security Administration: May apply for disability if have a Stage IV cancer. 585-791-1836 301-495-3387 ? [employer], Disability and Transit Services: Assists with nutrition, care and transit needs. [place] Support Programs:   > Cancer Support Group  2nd Tuesday of the month 1pm-2pm, Journey Room   > Creative Journey  3rd Tuesday of the month 1130am-1pm, Journey Room

## 2017-10-08 NOTE — Progress Notes (Signed)
CRITICAL VALUE ALERT Critical value received:  WBC 1.0  Date of notification:  10-08-2017 Time of notification: 4035 Critical value read back:  Yes.   Nurse who received alert:  C.Page RN MD notified (1st page):  Dr. Walden Field  Labs with in parameters today , no blood or platelets needed.. Vitals stable and discharged home from clinic ambulatory. Follow up as scheduled.

## 2017-10-11 ENCOUNTER — Encounter (HOSPITAL_COMMUNITY): Payer: Self-pay

## 2017-10-11 ENCOUNTER — Inpatient Hospital Stay (HOSPITAL_COMMUNITY): Payer: Medicare HMO

## 2017-10-11 ENCOUNTER — Other Ambulatory Visit: Payer: Self-pay

## 2017-10-11 VITALS — BP 132/58 | HR 87 | Temp 97.8°F | Resp 18 | Wt 171.8 lb

## 2017-10-11 DIAGNOSIS — Z452 Encounter for adjustment and management of vascular access device: Secondary | ICD-10-CM

## 2017-10-11 DIAGNOSIS — D464 Refractory anemia, unspecified: Secondary | ICD-10-CM

## 2017-10-11 DIAGNOSIS — Z5111 Encounter for antineoplastic chemotherapy: Secondary | ICD-10-CM | POA: Diagnosis not present

## 2017-10-11 DIAGNOSIS — C9200 Acute myeloblastic leukemia, not having achieved remission: Secondary | ICD-10-CM | POA: Diagnosis not present

## 2017-10-11 LAB — CBC WITH DIFFERENTIAL/PLATELET
Basophils Absolute: 0 10*3/uL (ref 0.0–0.1)
Basophils Relative: 0 %
EOS PCT: 0 %
Eosinophils Absolute: 0 10*3/uL (ref 0.0–0.7)
HCT: 28.3 % — ABNORMAL LOW (ref 39.0–52.0)
HEMOGLOBIN: 9.4 g/dL — AB (ref 13.0–17.0)
LYMPHS PCT: 47 %
Lymphs Abs: 0.4 10*3/uL — ABNORMAL LOW (ref 0.7–4.0)
MCH: 30.9 pg (ref 26.0–34.0)
MCHC: 33.2 g/dL (ref 30.0–36.0)
MCV: 93.1 fL (ref 78.0–100.0)
MONOS PCT: 3 %
Monocytes Absolute: 0 10*3/uL — ABNORMAL LOW (ref 0.1–1.0)
NEUTROS PCT: 50 %
Neutro Abs: 0.4 10*3/uL — ABNORMAL LOW (ref 1.7–7.7)
PLATELETS: 153 10*3/uL (ref 150–400)
RBC: 3.04 MIL/uL — AB (ref 4.22–5.81)
RDW: 17.5 % — AB (ref 11.5–15.5)
WBC: 0.8 10*3/uL — AB (ref 4.0–10.5)

## 2017-10-11 LAB — COMPREHENSIVE METABOLIC PANEL
ALT: 44 U/L (ref 0–44)
AST: 28 U/L (ref 15–41)
Albumin: 4.1 g/dL (ref 3.5–5.0)
Alkaline Phosphatase: 82 U/L (ref 38–126)
Anion gap: 7 (ref 5–15)
BUN: 16 mg/dL (ref 8–23)
CHLORIDE: 105 mmol/L (ref 98–111)
CO2: 27 mmol/L (ref 22–32)
Calcium: 9 mg/dL (ref 8.9–10.3)
Creatinine, Ser: 1.13 mg/dL (ref 0.61–1.24)
GFR calc non Af Amer: >60 mL/min (ref >60)
Glucose, Bld: 104 mg/dL — ABNORMAL HIGH (ref 70–99)
POTASSIUM: 3.8 mmol/L (ref 3.5–5.1)
SODIUM: 139 mmol/L (ref 135–145)
Total Bilirubin: 1.3 mg/dL — ABNORMAL HIGH (ref 0.3–1.2)
Total Protein: 6.7 g/dL (ref 6.5–8.1)

## 2017-10-11 LAB — MAGNESIUM: MAGNESIUM: 1.9 mg/dL (ref 1.7–2.4)

## 2017-10-11 MED ORDER — SODIUM CHLORIDE 0.9% FLUSH
20.0000 mL | INTRAVENOUS | Status: DC | PRN
  Administered 2017-10-11: 20 mL via INTRAVENOUS
  Filled 2017-10-11: qty 20

## 2017-10-11 NOTE — Progress Notes (Signed)
Omar Lee presented for PICC flush and dressing change.  See IV assessment in docflowsheets for PICC details. PICC located right arm.  Good blood return present. PICC flushed with 54ml NS, see MAR for further details.  Omar Lee tolerated procedure well and without incident.  Discharged ambulatory.

## 2017-10-11 NOTE — Progress Notes (Signed)
CRITICAL VALUE ALERT Critical value received:  WBC 0.8 Date of notification:  10-11-2017 Time of notification: 0950 Critical value read back:  Yes.   Nurse who received alert:  C. Page RN MD notified (1st page):  Dr. Delton Coombes

## 2017-10-15 ENCOUNTER — Encounter (HOSPITAL_COMMUNITY): Payer: Self-pay

## 2017-10-15 ENCOUNTER — Inpatient Hospital Stay (HOSPITAL_COMMUNITY): Payer: Medicare HMO | Attending: Oncology

## 2017-10-15 DIAGNOSIS — C9200 Acute myeloblastic leukemia, not having achieved remission: Secondary | ICD-10-CM | POA: Insufficient documentation

## 2017-10-15 DIAGNOSIS — Z5111 Encounter for antineoplastic chemotherapy: Secondary | ICD-10-CM | POA: Insufficient documentation

## 2017-10-15 DIAGNOSIS — R5383 Other fatigue: Secondary | ICD-10-CM | POA: Insufficient documentation

## 2017-10-15 DIAGNOSIS — D464 Refractory anemia, unspecified: Secondary | ICD-10-CM

## 2017-10-15 LAB — COMPREHENSIVE METABOLIC PANEL
ALK PHOS: 68 U/L (ref 38–126)
ALT: 32 U/L (ref 0–44)
AST: 23 U/L (ref 15–41)
Albumin: 3.9 g/dL (ref 3.5–5.0)
Anion gap: 6 (ref 5–15)
BUN: 18 mg/dL (ref 8–23)
CALCIUM: 8.9 mg/dL (ref 8.9–10.3)
CHLORIDE: 107 mmol/L (ref 98–111)
CO2: 27 mmol/L (ref 22–32)
CREATININE: 1.07 mg/dL (ref 0.61–1.24)
Glucose, Bld: 121 mg/dL — ABNORMAL HIGH (ref 70–99)
Potassium: 3.9 mmol/L (ref 3.5–5.1)
SODIUM: 140 mmol/L (ref 135–145)
Total Bilirubin: 1.3 mg/dL — ABNORMAL HIGH (ref 0.3–1.2)
Total Protein: 6.4 g/dL — ABNORMAL LOW (ref 6.5–8.1)

## 2017-10-15 LAB — CBC WITH DIFFERENTIAL/PLATELET
Basophils Absolute: 0 10*3/uL (ref 0.0–0.1)
Basophils Relative: 0 %
EOS ABS: 0 10*3/uL (ref 0.0–0.7)
EOS PCT: 0 %
HEMATOCRIT: 28.2 % — AB (ref 39.0–52.0)
Hemoglobin: 9.2 g/dL — ABNORMAL LOW (ref 13.0–17.0)
LYMPHS ABS: 0.3 10*3/uL (ref 0.7–4.0)
Lymphocytes Relative: 64 %
MCH: 31.2 pg (ref 26.0–34.0)
MCHC: 32.6 g/dL (ref 30.0–36.0)
MCV: 95.6 fL (ref 78.0–100.0)
MONOS PCT: 0 %
Monocytes Absolute: 0 10*3/uL (ref 0.1–1.0)
NEUTROS PCT: 36 %
Neutro Abs: 0.2 10*3/uL (ref 1.7–7.7)
Platelets: 237 10*3/uL (ref 150–400)
RBC: 2.95 MIL/uL — AB (ref 4.22–5.81)
RDW: 19.7 % — AB (ref 11.5–15.5)
WBC: 0.5 10*3/uL — CL (ref 4.0–10.5)

## 2017-10-15 LAB — MAGNESIUM: MAGNESIUM: 1.9 mg/dL (ref 1.7–2.4)

## 2017-10-15 MED ORDER — HEPARIN SOD (PORK) LOCK FLUSH 100 UNIT/ML IV SOLN
500.0000 [IU] | Freq: Once | INTRAVENOUS | Status: AC
  Administered 2017-10-15: 500 [IU] via INTRAVENOUS

## 2017-10-15 MED ORDER — SODIUM CHLORIDE 0.9% FLUSH
10.0000 mL | Freq: Once | INTRAVENOUS | Status: AC
  Administered 2017-10-15: 10 mL via INTRAVENOUS

## 2017-10-15 NOTE — Patient Instructions (Signed)
[place] at [facility]  Discharge Instructions:  Your PICC line was flushed today and your labs were faxed to [place].   _______________________________________________________________  Thank you for choosing [place] at [facility] to provide your oncology and hematology care.  To afford each patient quality time with our providers, please arrive at least 15 minutes before your scheduled appointment.  You need to re-schedule your appointment if you arrive 10 or more minutes late.  We strive to give you quality time with our providers, and arriving late affects you and other patients whose appointments are after yours.  Also, if you no show three or more times for appointments you may be dismissed from the clinic.  Again, thank you for choosing [place] at [place] hope is that these requests will allow you access to exceptional care and in a timely manner. _______________________________________________________________  If you have questions after your visit, please contact our office at [phone] between the hours of 8:30 a.m. and 5:00 p.m. Voicemails left after 4:30 p.m. will not be returned until the following business day. _______________________________________________________________  For prescription refill requests, have your pharmacy contact our office. _______________________________________________________________  Recommendations made by the consultant and any test results will be sent to your referring physician. _______________________________________________________________

## 2017-10-15 NOTE — Progress Notes (Signed)
CRITICAL VALUE ALERT Critical value received:  WBC Date of notification:  10/15/17 Time of notification: 3643 Critical value read back:  YES Nurse who received alert:  TWJackson RN MD notified (1st page):  Dr. Florene Glen [facility]

## 2017-10-15 NOTE — Progress Notes (Signed)
To treatment area for labs.  Patient stated SOB with exertion but able to perform ADL's during the day.  Increased weight but foods still taste "crappy" per patient.  Taking medications as prescribed by [facility].    Labs reviewed and faxed to [facility] per protocol.  Patient discharged in stable condition with no complaints or s/s of distress noted.

## 2017-10-16 DIAGNOSIS — D709 Neutropenia, unspecified: Secondary | ICD-10-CM | POA: Diagnosis not present

## 2017-10-16 DIAGNOSIS — C9200 Acute myeloblastic leukemia, not having achieved remission: Secondary | ICD-10-CM | POA: Diagnosis not present

## 2017-10-19 ENCOUNTER — Other Ambulatory Visit (HOSPITAL_COMMUNITY): Payer: Medicare HMO

## 2017-10-22 ENCOUNTER — Encounter (HOSPITAL_COMMUNITY): Payer: Self-pay

## 2017-10-22 ENCOUNTER — Ambulatory Visit (HOSPITAL_COMMUNITY): Payer: Medicare HMO | Admitting: Hematology

## 2017-10-22 ENCOUNTER — Other Ambulatory Visit: Payer: Self-pay

## 2017-10-22 ENCOUNTER — Ambulatory Visit (HOSPITAL_COMMUNITY): Payer: Medicare HMO

## 2017-10-22 ENCOUNTER — Inpatient Hospital Stay (HOSPITAL_COMMUNITY): Payer: Medicare HMO

## 2017-10-22 DIAGNOSIS — Z5111 Encounter for antineoplastic chemotherapy: Secondary | ICD-10-CM | POA: Diagnosis not present

## 2017-10-22 DIAGNOSIS — D464 Refractory anemia, unspecified: Secondary | ICD-10-CM

## 2017-10-22 DIAGNOSIS — R5383 Other fatigue: Secondary | ICD-10-CM | POA: Diagnosis not present

## 2017-10-22 DIAGNOSIS — C9200 Acute myeloblastic leukemia, not having achieved remission: Secondary | ICD-10-CM | POA: Diagnosis not present

## 2017-10-22 LAB — CBC WITH DIFFERENTIAL/PLATELET
BASOS ABS: 0 10*3/uL (ref 0.0–0.1)
BASOS PCT: 0 %
EOS ABS: 0 10*3/uL (ref 0.0–0.7)
EOS PCT: 2 %
HCT: 30.9 % — ABNORMAL LOW (ref 39.0–52.0)
Hemoglobin: 9.8 g/dL — ABNORMAL LOW (ref 13.0–17.0)
Lymphocytes Relative: 63 %
Lymphs Abs: 0.4 10*3/uL (ref 0.7–4.0)
MCH: 31.7 pg (ref 26.0–34.0)
MCHC: 31.7 g/dL (ref 30.0–36.0)
MCV: 100 fL (ref 78.0–100.0)
MONO ABS: 0.1 10*3/uL (ref 0.1–1.0)
Monocytes Relative: 15 %
Neutro Abs: 0.1 10*3/uL (ref 1.7–7.7)
Neutrophils Relative %: 20 %
PLATELETS: 317 10*3/uL (ref 150–400)
RBC: 3.09 MIL/uL — ABNORMAL LOW (ref 4.22–5.81)
RDW: 21.4 % — AB (ref 11.5–15.5)
WBC: 0.7 10*3/uL — CL (ref 4.0–10.5)

## 2017-10-22 LAB — COMPREHENSIVE METABOLIC PANEL
ALBUMIN: 4.1 g/dL (ref 3.5–5.0)
ALK PHOS: 67 U/L (ref 38–126)
ALT: 25 U/L (ref 0–44)
ANION GAP: 9 (ref 5–15)
AST: 21 U/L (ref 15–41)
BILIRUBIN TOTAL: 1 mg/dL (ref 0.3–1.2)
BUN: 17 mg/dL (ref 8–23)
CALCIUM: 9.1 mg/dL (ref 8.9–10.3)
CO2: 26 mmol/L (ref 22–32)
CREATININE: 1.02 mg/dL (ref 0.61–1.24)
Chloride: 106 mmol/L (ref 98–111)
GFR calc Af Amer: >60 mL/min (ref >60)
GFR calc non Af Amer: >60 mL/min (ref >60)
GLUCOSE: 102 mg/dL — AB (ref 70–99)
Potassium: 3.9 mmol/L (ref 3.5–5.1)
Sodium: 141 mmol/L (ref 135–145)
TOTAL PROTEIN: 6.7 g/dL (ref 6.5–8.1)

## 2017-10-22 LAB — MAGNESIUM: Magnesium: 2 mg/dL (ref 1.7–2.4)

## 2017-10-22 NOTE — Progress Notes (Signed)
Omar Lee presented for PICC flush and dressing change. PICC located right arm.  Good blood return present. PICC flushed with 37ml NS and 250U Heparin, see MAR for further details.  Omar Lee tolerated procedure well and without incident.        CRITICAL VALUE ALERT  Critical Value:  WBC 0.65  Date & Time Notied:  10/22/17 at 1150  Provider Notified: Dr. Florene Glen at [facility] - labs faxed  Orders Received/Actions taken: n/a

## 2017-10-23 ENCOUNTER — Ambulatory Visit (HOSPITAL_COMMUNITY): Payer: Medicare HMO

## 2017-10-24 ENCOUNTER — Ambulatory Visit (HOSPITAL_COMMUNITY): Payer: Medicare HMO

## 2017-10-25 ENCOUNTER — Encounter (HOSPITAL_COMMUNITY): Payer: Self-pay | Admitting: *Deleted

## 2017-10-25 ENCOUNTER — Other Ambulatory Visit (HOSPITAL_COMMUNITY): Payer: Medicare HMO

## 2017-10-25 ENCOUNTER — Ambulatory Visit (HOSPITAL_COMMUNITY): Payer: Medicare HMO

## 2017-10-25 NOTE — Progress Notes (Signed)
Kim from [facility] called me today to advise Omar Lee is going to start Decitabine on 7/22. She states that Dr. Florene Glen is aware his WBC have increased even though his [place] has remained low.  They want to not delay his treatment any longer, so she said regardless of labwork, he is to start on 7/22.

## 2017-10-26 ENCOUNTER — Ambulatory Visit (HOSPITAL_COMMUNITY): Payer: Medicare HMO

## 2017-10-29 ENCOUNTER — Other Ambulatory Visit: Payer: Self-pay

## 2017-10-29 ENCOUNTER — Inpatient Hospital Stay (HOSPITAL_COMMUNITY): Payer: Medicare HMO

## 2017-10-29 ENCOUNTER — Encounter (HOSPITAL_COMMUNITY): Payer: Self-pay

## 2017-10-29 DIAGNOSIS — C9200 Acute myeloblastic leukemia, not having achieved remission: Secondary | ICD-10-CM | POA: Diagnosis not present

## 2017-10-29 DIAGNOSIS — Z5111 Encounter for antineoplastic chemotherapy: Secondary | ICD-10-CM | POA: Diagnosis not present

## 2017-10-29 DIAGNOSIS — D464 Refractory anemia, unspecified: Secondary | ICD-10-CM

## 2017-10-29 DIAGNOSIS — R5383 Other fatigue: Secondary | ICD-10-CM | POA: Diagnosis not present

## 2017-10-29 LAB — CBC WITH DIFFERENTIAL/PLATELET
Basophils Absolute: 0 10*3/uL (ref 0.0–0.1)
Basophils Relative: 0 %
EOS PCT: 0 %
Eosinophils Absolute: 0 10*3/uL (ref 0.0–0.7)
HEMATOCRIT: 30.2 % — AB (ref 39.0–52.0)
HEMOGLOBIN: 9.8 g/dL — AB (ref 13.0–17.0)
LYMPHS ABS: 0.7 10*3/uL (ref 0.7–4.0)
LYMPHS PCT: 27 %
MCH: 33.1 pg (ref 26.0–34.0)
MCHC: 32.5 g/dL (ref 30.0–36.0)
MCV: 102 fL — ABNORMAL HIGH (ref 78.0–100.0)
MONO ABS: 0.4 10*3/uL (ref 0.1–1.0)
Monocytes Relative: 17 %
Neutro Abs: 1.4 10*3/uL — ABNORMAL LOW (ref 1.7–7.7)
Neutrophils Relative %: 56 %
Platelets: 222 10*3/uL (ref 150–400)
RBC: 2.96 MIL/uL — AB (ref 4.22–5.81)
RDW: 22.1 % — AB (ref 11.5–15.5)
WBC: 2.5 10*3/uL — AB (ref 4.0–10.5)

## 2017-10-29 LAB — COMPREHENSIVE METABOLIC PANEL
ALBUMIN: 3.9 g/dL (ref 3.5–5.0)
ALT: 24 U/L (ref 0–44)
AST: 21 U/L (ref 15–41)
Alkaline Phosphatase: 63 U/L (ref 38–126)
Anion gap: 9 (ref 5–15)
BUN: 16 mg/dL (ref 8–23)
CHLORIDE: 107 mmol/L (ref 98–111)
CO2: 25 mmol/L (ref 22–32)
CREATININE: 1.13 mg/dL (ref 0.61–1.24)
Calcium: 8.9 mg/dL (ref 8.9–10.3)
GFR calc Af Amer: >60 mL/min (ref >60)
GFR calc non Af Amer: >60 mL/min (ref >60)
GLUCOSE: 106 mg/dL — AB (ref 70–99)
Potassium: 3.8 mmol/L (ref 3.5–5.1)
Sodium: 141 mmol/L (ref 135–145)
Total Bilirubin: 1.1 mg/dL (ref 0.3–1.2)
Total Protein: 6.5 g/dL (ref 6.5–8.1)

## 2017-10-29 LAB — MAGNESIUM: MAGNESIUM: 1.9 mg/dL (ref 1.7–2.4)

## 2017-10-29 NOTE — Progress Notes (Signed)
Omar Lee presented for PICC flush and dressing change.  See IV assessment in docflowsheets for PICC details. PICC located right arm.  Good blood return present. PICC flushed with 62ml NS.  Omar Lee tolerated procedure well and without incident.  Discharged ambulatory.

## 2017-10-30 ENCOUNTER — Encounter (HOSPITAL_COMMUNITY): Payer: Self-pay | Admitting: *Deleted

## 2017-10-30 NOTE — Progress Notes (Signed)
Patient requested that he get a port a cath.  I called and spoke with Maudie Mercury at Dr. Abel Presto office. Per Shelbie Hutching, NP they are okay with patient getting a port and removing PICC line same day.  The request a double lumen port if possible.  They said it is okay for [place] to coordinate this placement.    Patient is scheduled to see Dr. Arnoldo Morale in his office this week.  Otila Kluver, patient's care giver is already aware of appointment.

## 2017-10-31 ENCOUNTER — Telehealth: Payer: Self-pay | Admitting: Family Medicine

## 2017-10-31 MED ORDER — LEVOTHYROXINE SODIUM 125 MCG PO TABS: ORAL_TABLET | ORAL | 0 refills | Status: DC

## 2017-10-31 NOTE — Telephone Encounter (Signed)
Medication refilled and left message on pts voicemail

## 2017-10-31 NOTE — Telephone Encounter (Signed)
Requesting refill on levothyroxine (SYNTHROID, LEVOTHROID) 125 MCG tablet 90 day supply.  [facility]

## 2017-11-01 ENCOUNTER — Other Ambulatory Visit (HOSPITAL_COMMUNITY): Payer: Medicare HMO

## 2017-11-01 ENCOUNTER — Encounter (HOSPITAL_COMMUNITY): Payer: Self-pay

## 2017-11-01 ENCOUNTER — Ambulatory Visit: Payer: Medicare HMO | Admitting: General Surgery

## 2017-11-01 ENCOUNTER — Encounter (HOSPITAL_COMMUNITY)
Admission: RE | Admit: 2017-11-01 | Discharge: 2017-11-01 | Disposition: A | Discharge status: Home / Self Care | Payer: Medicare HMO | Source: Ambulatory Visit | Attending: General Surgery | Admitting: General Surgery

## 2017-11-01 ENCOUNTER — Encounter: Payer: Self-pay | Admitting: General Surgery

## 2017-11-01 VITALS — BP 164/73 | HR 80 | Temp 96.8°F | Resp 18 | Wt 172.0 lb

## 2017-11-01 DIAGNOSIS — C9200 Acute myeloblastic leukemia, not having achieved remission: Secondary | ICD-10-CM

## 2017-11-01 NOTE — Patient Instructions (Signed)
Implanted Port Insertion  Implanted port insertion is a procedure to put in a port and catheter. The port is a device with an injectable disk that can be accessed by your health care provider. The port is connected to a vein in the chest or neck by a small flexible tube (catheter). There are different types of ports. The implanted port may be used as a long-term IV access for:  · Medicines, such as chemotherapy.  · Fluids.  · Liquid nutrition, such as total parenteral nutrition (TPN).  · Blood samples.    Having a port means that your health care provider will not need to use the veins in your arms for these procedures.  Tell a health care provider about:  · Any allergies you have.  · All medicines you are taking, especially blood thinners, as well as any vitamins, herbs, eye drops, creams, over-the-counter medicines, and steroids.  · Any problems you or family members have had with anesthetic medicines.  · Any blood disorders you have.  · Any surgeries you have had.  · Any medical conditions you have, including diabetes or kidney problems.  · Whether you are pregnant or may be pregnant.  What are the risks?  Generally, this is a safe procedure. However, problems may occur, including:  · Allergic reactions to medicines or dyes.  · Damage to other structures or organs.  · Infection.  · Damage to the blood vessel, bruising, or bleeding at the puncture site.  · Blood clot.  · Breakdown of the skin over the port.  · A collection of air in the chest that can cause one of the lungs to collapse (pneumothorax). This is rare.    What happens before the procedure?  Staying hydrated  Follow instructions from your health care provider about hydration, which may include:  · Up to 2 hours before the procedure - you may continue to drink clear liquids, such as water, clear fruit juice, black coffee, and plain tea.    Eating and drinking restrictions  · Follow instructions from your health care provider about eating and drinking,  which may include:  ? 8 hours before the procedure - stop eating heavy meals or foods such as meat, fried foods, or fatty foods.  ? 6 hours before the procedure - stop eating light meals or foods, such as toast or cereal.  ? 6 hours before the procedure - stop drinking milk or drinks that contain milk.  ? 2 hours before the procedure - stop drinking clear liquids.  Medicines  · Ask your health care provider about:  ? Changing or stopping your regular medicines. This is especially important if you are taking diabetes medicines or blood thinners.  ? Taking medicines such as aspirin and ibuprofen. These medicines can thin your blood. Do not take these medicines before your procedure if your health care provider instructs you not to.  · You may be given antibiotic medicine to help prevent infection.  General instructions  · Plan to have someone take you home from the hospital or clinic.  · If you will be going home right after the procedure, plan to have someone with you for 24 hours.  · You may have blood tests.  · You may be asked to shower with a germ-killing soap.  What happens during the procedure?  · To lower your risk of infection:  ? Your health care team will wash or sanitize their hands.  ? Your skin will be washed with   soap.  ? Hair may be removed from the surgical area.  · An IV tube will be inserted into one of your veins.  · You will be given one or more of the following:  ? A medicine to help you relax (sedative).  ? A medicine to numb the area (local anesthetic).  · Two small cuts (incisions) will be made to insert the port.  ? One incision will be made in your neck to get access to the vein where the catheter will lie.  ? The other incision will be made in the upper chest. This is where the port will lie.  · The procedure may be done using continuous X-ray (fluoroscopy) or other imaging tools for guidance.  · The port and catheter will be placed. There may be a small, raised area where the port  is.  · The port will be flushed with a salt solution (saline), and blood will be drawn to make sure that it is working correctly.  · The incisions will be closed.  · Bandages (dressings) may be placed over the incisions.  The procedure may vary among health care providers and hospitals.  What happens after the procedure?  · Your blood pressure, heart rate, breathing rate, and blood oxygen level will be monitored until the medicines you were given have worn off.  · Do not drive for 24 hours if you were given a sedative.  · You will be given a manufacturer's information card for the type of port that you have. Keep this with you.  · Your port will need to be flushed and checked as told by your health care provider, usually every few weeks.  · A chest X-ray will be done to:  ? Check the placement of the port.  ? Make sure there is no injury to your lung.  Summary  · Implanted port insertion is a procedure to put in a port and catheter.  · The implanted port is used as a long-term IV access.  · The port will need to be flushed and checked as told by your health care provider, usually every few weeks.  · Keep your manufacturer's information card with you at all times.  This information is not intended to replace advice given to you by your health care provider. Make sure you discuss any questions you have with your health care provider.  Document Released: 01/22/2013 Document Revised: 02/23/2016 Document Reviewed: 02/23/2016  Elsevier Interactive Patient Education © 2017 Elsevier Inc.

## 2017-11-01 NOTE — Progress Notes (Signed)
Omar Lee; [id number]; [birth year]   HPI Patient is a 76 year old white male who was referred to my care by Dr. Delton Coombes for Port-A-Cath placement.  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  He currently has a PICC line on the right side.  He has 9 out of 10 pain due to other issues. Past Medical History:  Diagnosis Date  . 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 Hyperlipidemia   . Hypothyroidism   . Reflux   . Sleep apnea     Past Surgical History:  Procedure Laterality Date  . ESOPHAGOGASTRODUODENOSCOPY (EGD) WITH ESOPHAGEAL DILATION N/A 11/22/2012   Procedure: ESOPHAGOGASTRODUODENOSCOPY (EGD) WITH ESOPHAGEAL DILATION;  Surgeon: Rogene Houston, MD;  Location: [facility];  Service: Endoscopy;  Laterality: N/A;  1120  . HEMORRHOID SURGERY    . NASAL SEPTUM SURGERY    . SHOULDER SURGERY Right   . TONSILLECTOMY AND ADENOIDECTOMY      Family History  Problem Relation Age of Onset  . Hypertension Mother   . Heart attack Mother   . Heart attack Father   . Diabetes Brother     Current Outpatient Medications on File Prior to Visit  Medication Sig Dispense Refill  . acetaminophen (TYLENOL) 325 MG tablet Take 650 mg by mouth as needed.    Marland Kitchen allopurinol (ZYLOPRIM) 300 MG tablet Take 1 tablet (300 mg total) by mouth daily. 30 tablet 0  . doxycycline (VIBRA-TABS) 100 MG tablet   0  . fluconazole (DIFLUCAN) 200 MG tablet   2  . hydroxyurea (HYDREA) 500 MG capsule Take 1 capsule (500 mg total) by mouth 2 (two) times daily. May take with food to minimize GI side effects. 60 capsule 0  . levothyroxine (SYNTHROID, LEVOTHROID) 125 MCG tablet TAKE 1 TABLET DAILY (NEED OFFICE VISIT) 90 tablet 0  . pantoprazole (PROTONIX) 40 MG tablet   0  . traMADol (ULTRAM) 50 MG tablet Take 100 mg by mouth 2 (two) times daily.  0  . VENCLEXTA 100 MG TABS   3   Current Facility-Administered Medications on File Prior to Visit  Medication Dose Route Frequency Provider Last Rate  Last Dose  . 0.9 %  sodium chloride infusion  250 mL Intravenous Once Twana First, MD      . acetaminophen (TYLENOL) tablet 650 mg  650 mg Oral Once Twana First, MD      . diphenhydrAMINE (BENADRYL) capsule 25 mg  25 mg Oral Once Twana First, MD      . heparin lock flush 100 unit/mL  500 Units Intracatheter Once PRN Derek Jack, MD      . sodium chloride flush (NS) 0.9 % injection 10 mL  10 mL Intracatheter PRN Holley Bouche, NP      . sodium chloride flush (NS) 0.9 % injection 10 mL  10 mL Intracatheter PRN Twana First, MD      . sodium chloride flush (NS) 0.9 % injection 10 mL  10 mL Intracatheter PRN Holley Bouche, NP      . sodium chloride flush (NS) 0.9 % injection 10 mL  10 mL Intracatheter PRN Derek Jack, MD   10 mL at 09/24/17 0845    No Known Allergies  Social History   Substance and Sexual Activity  Alcohol Use No    Social History   Tobacco Use  Smoking Status Never Smoker  Smokeless Tobacco Never Used    Review of Systems  Constitutional: Positive for malaise/fatigue.  HENT: Positive for hearing loss.   Eyes: Positive for blurred vision.  Respiratory: Negative.   Cardiovascular: Negative.   Gastrointestinal: Positive for abdominal pain and nausea. Negative for heartburn.  Genitourinary: Negative.   Musculoskeletal: Positive for joint pain.  Skin: Negative.   Neurological: Negative.   Endo/Heme/Allergies: Negative.   Psychiatric/Behavioral: Negative.     Objective   Vitals:   11/01/17 1020  BP: (!) 164/73  Pulse: 80  Resp: 18  Temp: (!) 96.8 F (36 C)    Physical Exam  Constitutional: He is oriented to person, place, and time. He appears well-developed and well-nourished. No distress.  HENT:  Head: Normocephalic and atraumatic.  Cardiovascular: Normal rate, regular rhythm and normal heart sounds. Exam reveals no gallop and no friction rub.  No murmur heard. Pulmonary/Chest: Effort normal and breath sounds normal. No  stridor. No respiratory distress. He has no wheezes. He has no rales.  Neurological: He is alert and oriented to person, place, and time.  Skin: Skin is warm and dry.  Vitals reviewed.  Dr. Tomie China notes reviewed Assessment  Acute myelogenous leukemia, need for central venous access Plan   Patient is scheduled for Port-A-Cath insertion on 11/02/2017.  The risks and benefits of the procedure including bleeding, infection, and pneumothorax were fully explained to the patient, who gave informed consent.

## 2017-11-01 NOTE — H&P (Signed)
Omar Lee; [id number]; February 11, 1941   HPI Patient is a 77 year old white male who was referred to my care by Dr. Delton Coombes for Port-A-Cath placement.  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  He currently has a PICC line on the right side.  He has 9 out of 10 pain due to other issues. Past Medical History:  Diagnosis Date  . 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 Hyperlipidemia   . Hypothyroidism   . Reflux   . Sleep apnea     Past Surgical History:  Procedure Laterality Date  . ESOPHAGOGASTRODUODENOSCOPY (EGD) WITH ESOPHAGEAL DILATION N/A 11/22/2012   Procedure: ESOPHAGOGASTRODUODENOSCOPY (EGD) WITH ESOPHAGEAL DILATION;  Surgeon: Rogene Houston, MD;  Location: [facility];  Service: Endoscopy;  Laterality: N/A;  1120  . HEMORRHOID SURGERY    . NASAL SEPTUM SURGERY    . SHOULDER SURGERY Right   . TONSILLECTOMY AND ADENOIDECTOMY      Family History  Problem Relation Age of Onset  . Hypertension Mother   . Heart attack Mother   . Heart attack Father   . Diabetes Brother     Current Outpatient Medications on File Prior to Visit  Medication Sig Dispense Refill  . acetaminophen (TYLENOL) 325 MG tablet Take 650 mg by mouth as needed.    Marland Kitchen allopurinol (ZYLOPRIM) 300 MG tablet Take 1 tablet (300 mg total) by mouth daily. 30 tablet 0  . doxycycline (VIBRA-TABS) 100 MG tablet   0  . fluconazole (DIFLUCAN) 200 MG tablet   2  . hydroxyurea (HYDREA) 500 MG capsule Take 1 capsule (500 mg total) by mouth 2 (two) times daily. May take with food to minimize GI side effects. 60 capsule 0  . levothyroxine (SYNTHROID, LEVOTHROID) 125 MCG tablet TAKE 1 TABLET DAILY (NEED OFFICE VISIT) 90 tablet 0  . pantoprazole (PROTONIX) 40 MG tablet   0  . traMADol (ULTRAM) 50 MG tablet Take 100 mg by mouth 2 (two) times daily.  0  . VENCLEXTA 100 MG TABS   3   Current Facility-Administered Medications on File Prior to Visit  Medication Dose Route Frequency Provider Last Rate  Last Dose  . 0.9 %  sodium chloride infusion  250 mL Intravenous Once Twana First, MD      . acetaminophen (TYLENOL) tablet 650 mg  650 mg Oral Once Twana First, MD      . diphenhydrAMINE (BENADRYL) capsule 25 mg  25 mg Oral Once Twana First, MD      . heparin lock flush 100 unit/mL  500 Units Intracatheter Once PRN Derek Jack, MD      . sodium chloride flush (NS) 0.9 % injection 10 mL  10 mL Intracatheter PRN Holley Bouche, NP      . sodium chloride flush (NS) 0.9 % injection 10 mL  10 mL Intracatheter PRN Twana First, MD      . sodium chloride flush (NS) 0.9 % injection 10 mL  10 mL Intracatheter PRN Holley Bouche, NP      . sodium chloride flush (NS) 0.9 % injection 10 mL  10 mL Intracatheter PRN Derek Jack, MD   10 mL at 09/24/17 0845    No Known Allergies  Social History   Substance and Sexual Activity  Alcohol Use No    Social History   Tobacco Use  Smoking Status Never Smoker  Smokeless Tobacco Never Used    Review of Systems  Constitutional: Positive for malaise/fatigue.  HENT: Positive for hearing loss.   Eyes: Positive for blurred vision.  Respiratory: Negative.   Cardiovascular: Negative.   Gastrointestinal: Positive for abdominal pain and nausea. Negative for heartburn.  Genitourinary: Negative.   Musculoskeletal: Positive for joint pain.  Skin: Negative.   Neurological: Negative.   Endo/Heme/Allergies: Negative.   Psychiatric/Behavioral: Negative.     Objective   Vitals:   11/01/17 1020  BP: (!) 164/73  Pulse: 80  Resp: 18  Temp: (!) 96.8 F (36 C)    Physical Exam  Constitutional: He is oriented to person, place, and time. He appears well-developed and well-nourished. No distress.  HENT:  Head: Normocephalic and atraumatic.  Cardiovascular: Normal rate, regular rhythm and normal heart sounds. Exam reveals no gallop and no friction rub.  No murmur heard. Pulmonary/Chest: Effort normal and breath sounds normal. No  stridor. No respiratory distress. He has no wheezes. He has no rales.  Neurological: He is alert and oriented to person, place, and time.  Skin: Skin is warm and dry.  Vitals reviewed.  Dr. Tomie China notes reviewed Assessment  Acute myelogenous leukemia, need for central venous access Plan   Patient is scheduled for Port-A-Cath insertion on 11/02/2017.  The risks and benefits of the procedure including bleeding, infection, and pneumothorax were fully explained to the patient, who gave informed consent.

## 2017-11-02 ENCOUNTER — Ambulatory Visit (HOSPITAL_COMMUNITY): Payer: Medicare HMO

## 2017-11-02 ENCOUNTER — Other Ambulatory Visit: Payer: Self-pay

## 2017-11-02 ENCOUNTER — Encounter (HOSPITAL_COMMUNITY)
Admission: RE | Disposition: A | Discharge status: Home / Self Care | Payer: Self-pay | Source: Ambulatory Visit | Attending: General Surgery

## 2017-11-02 ENCOUNTER — Ambulatory Visit (HOSPITAL_COMMUNITY): Payer: Medicare HMO | Admitting: Anesthesiology

## 2017-11-02 ENCOUNTER — Ambulatory Visit (HOSPITAL_COMMUNITY)
Admission: RE | Admit: 2017-11-02 | Discharge: 2017-11-02 | Disposition: A | Discharge status: Home / Self Care | Payer: Medicare HMO | Source: Ambulatory Visit | Attending: General Surgery | Admitting: General Surgery

## 2017-11-02 ENCOUNTER — Encounter (HOSPITAL_COMMUNITY): Payer: Self-pay | Admitting: *Deleted

## 2017-11-02 DIAGNOSIS — E039 Hypothyroidism, unspecified: Secondary | ICD-10-CM | POA: Insufficient documentation

## 2017-11-02 DIAGNOSIS — Z95828 Presence of other vascular implants and grafts: Secondary | ICD-10-CM

## 2017-11-02 DIAGNOSIS — E785 Hyperlipidemia, unspecified: Secondary | ICD-10-CM | POA: Insufficient documentation

## 2017-11-02 DIAGNOSIS — K219 Gastro-esophageal reflux disease without esophagitis: Secondary | ICD-10-CM | POA: Insufficient documentation

## 2017-11-02 DIAGNOSIS — G4730 Sleep apnea, unspecified: Secondary | ICD-10-CM | POA: Insufficient documentation

## 2017-11-02 DIAGNOSIS — C9200 Acute myeloblastic leukemia, not having achieved remission: Secondary | ICD-10-CM

## 2017-11-02 DIAGNOSIS — D649 Anemia, unspecified: Secondary | ICD-10-CM | POA: Diagnosis not present

## 2017-11-02 DIAGNOSIS — Z452 Encounter for adjustment and management of vascular access device: Secondary | ICD-10-CM | POA: Diagnosis not present

## 2017-11-02 DIAGNOSIS — Z8249 Family history of ischemic heart disease and other diseases of the circulatory system: Secondary | ICD-10-CM | POA: Diagnosis not present

## 2017-11-02 DIAGNOSIS — Z79899 Other long term (current) drug therapy: Secondary | ICD-10-CM | POA: Insufficient documentation

## 2017-11-02 HISTORY — PX: PORTACATH PLACEMENT: SHX2246

## 2017-11-02 SURGERY — INSERTION, TUNNELED CENTRAL VENOUS DEVICE, WITH PORT
Anesthesia: Monitor Anesthesia Care | Laterality: Left

## 2017-11-02 MED ORDER — MIDAZOLAM HCL 2 MG/2ML IJ SOLN
INTRAMUSCULAR | Status: AC
  Filled 2017-11-02: qty 2

## 2017-11-02 MED ORDER — HYDROCODONE-ACETAMINOPHEN 7.5-325 MG PO TABS: 1.0000 | ORAL_TABLET | Freq: Once | ORAL | Status: DC | PRN

## 2017-11-02 MED ORDER — LIDOCAINE HCL (PF) 1 % IJ SOLN
INTRAMUSCULAR | Status: DC | PRN
  Administered 2017-11-02: 9 mL

## 2017-11-02 MED ORDER — FENTANYL CITRATE (PF) 100 MCG/2ML IJ SOLN: 25.0000 ug | INTRAMUSCULAR | Status: DC | PRN

## 2017-11-02 MED ORDER — LACTATED RINGERS IV SOLN
INTRAVENOUS | Status: DC
  Administered 2017-11-02: 09:00:00 via INTRAVENOUS

## 2017-11-02 MED ORDER — KETOROLAC TROMETHAMINE 30 MG/ML IJ SOLN
15.0000 mg | Freq: Once | INTRAMUSCULAR | Status: AC
  Administered 2017-11-02: 15 mg via INTRAVENOUS

## 2017-11-02 MED ORDER — SODIUM CHLORIDE 0.9 % IJ SOLN
INTRAMUSCULAR | Status: DC | PRN
  Administered 2017-11-02: 500 mL

## 2017-11-02 MED ORDER — CHLORHEXIDINE GLUCONATE CLOTH 2 % EX PADS: 6.0000 | MEDICATED_PAD | Freq: Once | CUTANEOUS | Status: DC

## 2017-11-02 MED ORDER — FENTANYL CITRATE (PF) 100 MCG/2ML IJ SOLN
INTRAMUSCULAR | Status: AC
  Filled 2017-11-02: qty 2

## 2017-11-02 MED ORDER — LIDOCAINE HCL (PF) 1 % IJ SOLN
INTRAMUSCULAR | Status: AC
  Filled 2017-11-02: qty 30

## 2017-11-02 MED ORDER — MIDAZOLAM HCL 5 MG/5ML IJ SOLN
INTRAMUSCULAR | Status: DC | PRN
  Administered 2017-11-02: 2 mg via INTRAVENOUS

## 2017-11-02 MED ORDER — HEPARIN SOD (PORK) LOCK FLUSH 100 UNIT/ML IV SOLN
INTRAVENOUS | Status: AC
  Filled 2017-11-02: qty 5

## 2017-11-02 MED ORDER — CEFAZOLIN SODIUM-DEXTROSE 2-4 GM/100ML-% IV SOLN
2.0000 g | INTRAVENOUS | Status: AC
  Administered 2017-11-02: 2 g via INTRAVENOUS
  Filled 2017-11-02: qty 100

## 2017-11-02 MED ORDER — KETOROLAC TROMETHAMINE 30 MG/ML IJ SOLN
INTRAMUSCULAR | Status: AC
Start: 2017-11-02 — End: ?
  Filled 2017-11-02: qty 1

## 2017-11-02 MED ORDER — HEPARIN SOD (PORK) LOCK FLUSH 100 UNIT/ML IV SOLN
INTRAVENOUS | Status: DC | PRN
  Administered 2017-11-02: 500 [IU] via INTRAVENOUS

## 2017-11-02 MED ORDER — PROPOFOL 500 MG/50ML IV EMUL
INTRAVENOUS | Status: DC | PRN
  Administered 2017-11-02: 50 ug/kg/min via INTRAVENOUS

## 2017-11-02 SURGICAL SUPPLY — 33 items
ADH SKN CLS APL DERMABOND .7 (GAUZE/BANDAGES/DRESSINGS) ×1
BAG DECANTER FOR FLEXI CONT (MISCELLANEOUS) ×2 IMPLANT
CHLORAPREP W/TINT 10.5 ML (MISCELLANEOUS) ×2 IMPLANT
CLOTH BEACON ORANGE TIMEOUT ST (SAFETY) ×2 IMPLANT
COVER LIGHT HANDLE STERIS (MISCELLANEOUS) ×4 IMPLANT
DECANTER SPIKE VIAL GLASS SM (MISCELLANEOUS) ×2 IMPLANT
DERMABOND ADVANCED (GAUZE/BANDAGES/DRESSINGS) ×1
DERMABOND ADVANCED .7 DNX12 (GAUZE/BANDAGES/DRESSINGS) ×1 IMPLANT
DRAPE C-ARM FOLDED MOBILE STRL (DRAPES) ×2 IMPLANT
ELECT REM PT RETURN 9FT ADLT (ELECTROSURGICAL) ×2
ELECTRODE REM PT RTRN 9FT ADLT (ELECTROSURGICAL) ×1 IMPLANT
GLOVE BIOGEL PI IND STRL 6.5 (GLOVE) IMPLANT
GLOVE BIOGEL PI IND STRL 7.0 (GLOVE) ×2 IMPLANT
GLOVE BIOGEL PI INDICATOR 6.5 (GLOVE) ×1
GLOVE BIOGEL PI INDICATOR 7.0 (GLOVE) ×2
GLOVE ECLIPSE 7.0 STRL STRAW (GLOVE) ×1 IMPLANT
GLOVE SURG SS PI 7.5 STRL IVOR (GLOVE) ×2 IMPLANT
GOWN STRL REUS W/TWL LRG LVL3 (GOWN DISPOSABLE) ×4 IMPLANT
IV NS 500ML (IV SOLUTION) ×2
IV NS 500ML BAXH (IV SOLUTION) ×1 IMPLANT
KIT PORT POWER 8FR ISP MRI (Port) ×2 IMPLANT
KIT TURNOVER KIT A (KITS) ×2 IMPLANT
NDL HYPO 25X1 1.5 SAFETY (NEEDLE) ×1 IMPLANT
NEEDLE HYPO 25X1 1.5 SAFETY (NEEDLE) ×2 IMPLANT
PACK MINOR (CUSTOM PROCEDURE TRAY) ×2 IMPLANT
PAD ARMBOARD 7.5X6 YLW CONV (MISCELLANEOUS) ×2 IMPLANT
SET BASIN LINEN APH (SET/KITS/TRAYS/PACK) ×2 IMPLANT
SUT MNCRL AB 4-0 PS2 18 (SUTURE) ×2 IMPLANT
SUT VIC AB 3-0 SH 27 (SUTURE) ×2
SUT VIC AB 3-0 SH 27X BRD (SUTURE) ×1 IMPLANT
SYR 20CC LL (SYRINGE) ×2 IMPLANT
SYR 5ML LL (SYRINGE) ×2 IMPLANT
SYR CONTROL 10ML LL (SYRINGE) ×2 IMPLANT

## 2017-11-02 NOTE — Anesthesia Postprocedure Evaluation (Signed)
Anesthesia Post Note  Patient: Omar Lee  Procedure(s) Performed: INSERTION PORT WITH ATTACHED CATHETER LEFT SUBCLAVIAN (Left )  Patient location during evaluation: Short Stay Anesthesia Type: MAC Level of consciousness: awake and alert and patient cooperative Pain management: satisfactory to patient Vital Signs Assessment: post-procedure vital signs reviewed and stable Respiratory status: spontaneous breathing Cardiovascular status: stable Postop Assessment: no apparent nausea or vomiting Anesthetic complications: no     Last Vitals:  Vitals:   11/02/17 0930 11/02/17 0945  BP: (!) 122/58 (!) 126/59  Pulse:  65  Resp: (!) 26 11  Temp: 36.5 C   SpO2: 100% 100%    Last Pain:  Vitals:   11/02/17 0945  TempSrc:   PainSc: 0-No pain                 Basha Krygier

## 2017-11-02 NOTE — Interval H&P Note (Signed)
History and Physical Interval Note:  11/02/2017 8:12 AM  Omar Lee  has presented today for surgery, with the diagnosis of acute myelogenous leukemia  The various methods of treatment have been discussed with the patient and family. After consideration of risks, benefits and other options for treatment, the patient has consented to  Procedure(s): INSERTION PORT-A-CATH (Left) as a surgical intervention .  The patient's history has been reviewed, patient examined, no change in status, stable for surgery.  I have reviewed the patient's chart and labs.  Questions were answered to the patient's satisfaction.     Aviva Signs

## 2017-11-02 NOTE — Anesthesia Preprocedure Evaluation (Signed)
Anesthesia Evaluation  Patient identified by MRN, date of birth, ID band Patient awake    Reviewed: Allergy & Precautions, NPO status , Patient's Chart, lab work & pertinent test results  Airway Mallampati: II  TM Distance: >3 FB Neck ROM: Full    Dental no notable dental hx. (+) Poor Dentition   Pulmonary neg pulmonary ROS,  H/o OSA -tx'd with UPPP- ~50 years ago   Pulmonary exam normal breath sounds clear to auscultation       Cardiovascular Exercise Tolerance: Good negative cardio ROS Normal cardiovascular examI Rhythm:Regular Rate:Normal     Neuro/Psych negative neurological ROS  negative psych ROS   GI/Hepatic negative GI ROS, Neg liver ROS, GERD  Medicated and Controlled,Rarely takes the meds -only when needed   Endo/Other  negative endocrine ROSHypothyroidism   Renal/GU negative Renal ROS  negative genitourinary   Musculoskeletal negative musculoskeletal ROS (+)   Abdominal   Peds negative pediatric ROS (+)  Hematology negative hematology ROS (+) anemia , WBCs up to 2.5K from 0.7K a week ago   Anesthesia Other Findings   Reproductive/Obstetrics negative OB ROS                             Anesthesia Physical Anesthesia Plan  ASA: III  Anesthesia Plan: MAC   Post-op Pain Management:    Induction: Intravenous  PONV Risk Score and Plan:   Airway Management Planned: Simple Face Mask and Nasal Cannula  Additional Equipment:   Intra-op Plan:   Post-operative Plan:   Informed Consent: I have reviewed the patients History and Physical, chart, labs and discussed the procedure including the risks, benefits and alternatives for the proposed anesthesia with the patient or authorized representative who has indicated his/her understanding and acceptance.   Dental advisory given  Plan Discussed with: CRNA  Anesthesia Plan Comments:         Anesthesia Quick  Evaluation

## 2017-11-02 NOTE — Transfer of Care (Signed)
Immediate Anesthesia Transfer of Care Note  Patient: Omar Lee  Procedure(s) Performed: INSERTION PORT WITH ATTACHED CATHETER LEFT SUBCLAVIAN (Left )  Patient Location: PACU  Anesthesia Type:MAC  Level of Consciousness: drowsy and patient cooperative  Airway & Oxygen Therapy: Patient Spontanous Breathing and Patient connected to nasal cannula oxygen  Post-op Assessment: Report given to RN, Post -op Vital signs reviewed and stable and Patient moving all extremities  Post vital signs: Reviewed and stable  Last Vitals:  Vitals Value Taken Time  BP    Temp    Pulse    Resp    SpO2      Last Pain:  Vitals:   11/02/17 0817  TempSrc:   PainSc: 0-No pain         Complications: No apparent anesthesia complications

## 2017-11-02 NOTE — Op Note (Signed)
Patient:  SHARON STAPEL  DOB:  03-01-1941  MRN:  [id number]   Preop Diagnosis: Acute myelogenous leukemia  Postop Diagnosis: Same  Procedure: Port-A-Cath insertion  Surgeon: Aviva Signs, MD  Anes: MAC  Indications: Patient is a 77 year old white male who was referred to my care for Port-A-Cath insertion.  He needs central venous access for treatment of acute myelogenous leukemia.  The risks and benefits of the procedure including bleeding, infection, and pneumothorax were fully explained to the patient, who gave informed consent.  Procedure note: The patient was placed in Trendelenburg position after the left upper chest was prepped and draped using usual sterile technique with ChloraPrep.  Surgical site confirmation was performed.  1% Xylocaine was used for local anesthesia.  An incision was made below the left clavicle.  Subcutaneous pocket was formed.  The needle was advanced into the left subclavian vein without difficulty.  A guidewire was then advanced into the right atrium under fluoroscopic guidance.  An introducer and peel-away sheath were placed over the guidewire.  The catheter was then inserted through the peel-away sheath and the peel-away sheath was removed.  The catheter was then attached to the port and the port placed in subcutaneous pocket.  Adequate positioning was confirmed by fluoroscopy.  Good backflow of blood was noted on aspiration of the port.  The port was flushed with heparin flush.  Subcutaneous layer was reapproximated using a 3-0 Vicryl interrupted suture.  Skin was closed using a 4-0 Monocryl subcuticular suture.  Dermabond was applied.  All tape and needle counts were correct at the end of the procedure.  Patient was transferred to the PACU in stable condition.  A chest x-ray will be performed at that time.  Complications: None  EBL: Minimal  Specimen: None

## 2017-11-02 NOTE — Discharge Instructions (Signed)
Implanted Port Home Guide °An implanted port is a type of central line that is placed under the skin. Central lines are used to provide IV access when treatment or nutrition needs to be given through a person’s veins. Implanted ports are used for long-term IV access. An implanted port may be placed because: °· You need IV medicine that would be irritating to the small veins in your hands or arms. °· You need long-term IV medicines, such as antibiotics. °· You need IV nutrition for a long period. °· You need frequent blood draws for lab tests. °· You need dialysis. ° °Implanted ports are usually placed in the chest area, but they can also be placed in the upper arm, the abdomen, or the leg. An implanted port has two main parts: °· Reservoir. The reservoir is round and will appear as a small, raised area under your skin. The reservoir is the part where a needle is inserted to give medicines or draw blood. °· Catheter. The catheter is a thin, flexible tube that extends from the reservoir. The catheter is placed into a large vein. Medicine that is inserted into the reservoir goes into the catheter and then into the vein. ° °How will I care for my incision site? °Do not get the incision site wet. Bathe or shower as directed by your health care provider. °How is my port accessed? °Special steps must be taken to access the port: °· Before the port is accessed, a numbing cream can be placed on the skin. This helps numb the skin over the port site. °· Your health care provider uses a sterile technique to access the port. °? Your health care provider must put on a mask and sterile gloves. °? The skin over your port is cleaned carefully with an antiseptic and allowed to dry. °? The port is gently pinched between sterile gloves, and a needle is inserted into the port. °· Only "non-coring" port needles should be used to access the port. Once the port is accessed, a blood return should be checked. This helps ensure that the port  is in the vein and is not clogged. °· If your port needs to remain accessed for a constant infusion, a clear (transparent) bandage will be placed over the needle site. The bandage and needle will need to be changed every week, or as directed by your health care provider. °· Keep the bandage covering the needle clean and dry. Do not get it wet. Follow your health care provider’s instructions on how to take a shower or bath while the port is accessed. °· If your port does not need to stay accessed, no bandage is needed over the port. ° °What is flushing? °Flushing helps keep the port from getting clogged. Follow your health care provider’s instructions on how and when to flush the port. Ports are usually flushed with saline solution or a medicine called heparin. The need for flushing will depend on how the port is used. °· If the port is used for intermittent medicines or blood draws, the port will need to be flushed: °? After medicines have been given. °? After blood has been drawn. °? As part of routine maintenance. °· If a constant infusion is running, the port may not need to be flushed. ° °How long will my port stay implanted? °The port can stay in for as long as your health care provider thinks it is needed. When it is time for the port to come out, surgery will be   done to remove it. The procedure is similar to the one performed when the port was put in. °When should I seek immediate medical care? °When you have an implanted port, you should seek immediate medical care if: °· You notice a bad smell coming from the incision site. °· You have swelling, redness, or drainage at the incision site. °· You have more swelling or pain at the port site or the surrounding area. °· You have a fever that is not controlled with medicine. ° °This information is not intended to replace advice given to you by your health care provider. Make sure you discuss any questions you have with your health care provider. °Document  Released: 04/03/2005 Document Revised: 09/09/2015 Document Reviewed: 12/09/2012 °Elsevier Interactive Patient Education © 2017 [employer] °Implanted Port Insertion, Care After °This sheet gives you information about how to care for yourself after your procedure. Your health care provider may also give you more specific instructions. If you have problems or questions, contact your health care provider. °What can I expect after the procedure? °After your procedure, it is common to have: °· Discomfort at the port insertion site. °· Bruising on the skin over the port. This should improve over 3-4 days. ° °Follow these instructions at home: °Port care °· After your port is placed, you will get a manufacturer's information card. The card has information about your port. Keep this card with you at all times. °· Take care of the port as told by your health care provider. Ask your health care provider if you or a family member can get training for taking care of the port at home. A home health care nurse may also take care of the port. °· Make sure to remember what type of port you have. °Incision care °· Follow instructions from your health care provider about how to take care of your port insertion site. Make sure you: °? Wash your hands with soap and water before you change your bandage (dressing). If soap and water are not available, use hand sanitizer. °? Change your dressing as told by your health care provider. °? Leave stitches (sutures), skin glue, or adhesive strips in place. These skin closures may need to stay in place for 2 weeks or longer. If adhesive strip edges start to loosen and curl up, you may trim the loose edges. Do not remove adhesive strips completely unless your health care provider tells you to do that. °· Check your port insertion site every day for signs of infection. Check for: °? More redness, swelling, or pain. °? More fluid or blood. °? Warmth. °? Pus or a bad smell. °General  instructions °· Do not take baths, swim, or use a hot tub until your health care provider approves. °· Do not lift anything that is heavier than 10 lb (4.5 kg) for a week, or as told by your health care provider. °· Ask your health care provider when it is okay to: °? Return to work or school. °? Resume usual physical activities or sports. °· Do not drive for 24 hours if you were given a medicine to help you relax (sedative). °· Take over-the-counter and prescription medicines only as told by your health care provider. °· Wear a medical alert bracelet in case of an emergency. This will tell any health care providers that you have a port. °· Keep all follow-up visits as told by your health care provider. This is important. °Contact a health care provider if: °· You cannot   flush your port with saline as directed, or you cannot draw blood from the port.  You have a fever or chills.  You have more redness, swelling, or pain around your port insertion site.  You have more fluid or blood coming from your port insertion site.  Your port insertion site feels warm to the touch.  You have pus or a bad smell coming from the port insertion site. Get help right away if:  You have chest pain or shortness of breath.  You have bleeding from your port that you cannot control. Summary  Take care of the port as told by your health care provider.  Change your dressing as told by your health care provider.  Keep all follow-up visits as told by your health care provider. This information is not intended to replace advice given to you by your health care provider. Make sure you discuss any questions you have with your health care provider. Document Released: 01/22/2013 Document Revised: 02/23/2016 Document Reviewed: 02/23/2016 Elsevier Interactive Patient Education  2017 [employer] [employer].  PICC Removal, Care After Refer to this sheet in the next few weeks. These instructions provide you with information on caring  for yourself after your procedure. Your health care provider may also give you more specific instructions. Your treatment has been planned according to current medical practices, but problems sometimes occur. Call your health care provider if you have any problems or questions after your procedure. What can I expect after the procedure? After your procedure, it is typical to have mild discomfort at the insertion site. This should not last for more than a day. Follow these instructions at home: You may remove the bandage after 24 hours. The PICC insertion site is very small. A small scab may develop over the insertion site. It is okay to wash the site gently with soap and water. Be careful not to remove or pick off the scab. Gently pat the site dry after washing it. You do not need to put another bandage over the insertion site. Do not lift anything heavy or do strenuous physical activity for 24 hours after the PICC is removed. This includes:  Weight lifting.  Strenuous yard work.  Any physical activity with repetitive arm movement.  Contact a health care provider if:  You have swelling or puffiness in your arm at the PICC insertion site.  You have increasing tenderness at the PICC insertion site. Get help right away if:  You have numbness or tingling in your fingers, hand, or arm.  Your arm looks blue and feels cold.  You have redness around the insertion site or a red streak goes up your arm.  You have any type of drainage from the PICC insertion site. This includes drainage such as: ? Bleeding from the insertion site. If this happens, apply firm, direct pressure to the PICC insertion site with a clean towel. ? Drainage that is yellow or tan.  You have a fever. This information is not intended to replace advice given to you by your health care provider. Make sure you discuss any questions you have with your health care provider. Document Released: 04/08/2013 Document Revised:  09/09/2015 Document Reviewed: 01/24/2013 Elsevier Interactive Patient Education  2017 [employer].

## 2017-11-05 ENCOUNTER — Inpatient Hospital Stay (HOSPITAL_COMMUNITY): Payer: Medicare HMO

## 2017-11-05 ENCOUNTER — Other Ambulatory Visit: Payer: Self-pay

## 2017-11-05 ENCOUNTER — Encounter (HOSPITAL_COMMUNITY): Payer: Self-pay | Admitting: General Surgery

## 2017-11-05 ENCOUNTER — Other Ambulatory Visit (HOSPITAL_COMMUNITY): Payer: Medicare HMO

## 2017-11-05 ENCOUNTER — Inpatient Hospital Stay (HOSPITAL_BASED_OUTPATIENT_CLINIC_OR_DEPARTMENT_OTHER): Payer: Medicare HMO | Admitting: Hematology

## 2017-11-05 VITALS — BP 124/53 | HR 61 | Temp 97.6°F | Resp 18

## 2017-11-05 DIAGNOSIS — R5383 Other fatigue: Secondary | ICD-10-CM

## 2017-11-05 DIAGNOSIS — C9200 Acute myeloblastic leukemia, not having achieved remission: Secondary | ICD-10-CM

## 2017-11-05 DIAGNOSIS — D464 Refractory anemia, unspecified: Secondary | ICD-10-CM

## 2017-11-05 DIAGNOSIS — Z5111 Encounter for antineoplastic chemotherapy: Secondary | ICD-10-CM | POA: Diagnosis not present

## 2017-11-05 LAB — CBC WITH DIFFERENTIAL/PLATELET
BASOS ABS: 0 10*3/uL (ref 0.0–0.1)
Basophils Relative: 0 %
Eosinophils Absolute: 0 10*3/uL (ref 0.0–0.7)
Eosinophils Relative: 0 %
HEMATOCRIT: 33.5 % — AB (ref 39.0–52.0)
Hemoglobin: 10.9 g/dL — ABNORMAL LOW (ref 13.0–17.0)
Lymphocytes Relative: 15 %
Lymphs Abs: 0.6 10*3/uL — ABNORMAL LOW (ref 0.7–4.0)
MCH: 33.2 pg (ref 26.0–34.0)
MCHC: 32.5 g/dL (ref 30.0–36.0)
MCV: 102.1 fL — ABNORMAL HIGH (ref 78.0–100.0)
MONO ABS: 0.6 10*3/uL (ref 0.1–1.0)
MONOS PCT: 15 %
NEUTROS ABS: 3 10*3/uL (ref 1.7–7.7)
Neutrophils Relative %: 70 %
Platelets: 236 10*3/uL (ref 150–400)
RBC: 3.28 MIL/uL — ABNORMAL LOW (ref 4.22–5.81)
RDW: 20.2 % — AB (ref 11.5–15.5)
WBC: 4.3 10*3/uL (ref 4.0–10.5)

## 2017-11-05 LAB — COMPREHENSIVE METABOLIC PANEL
ALT: 21 U/L (ref 0–44)
AST: 20 U/L (ref 15–41)
Albumin: 4.1 g/dL (ref 3.5–5.0)
Alkaline Phosphatase: 67 U/L (ref 38–126)
Anion gap: 7 (ref 5–15)
BUN: 14 mg/dL (ref 8–23)
CO2: 29 mmol/L (ref 22–32)
CREATININE: 1.02 mg/dL (ref 0.61–1.24)
Calcium: 9 mg/dL (ref 8.9–10.3)
Chloride: 106 mmol/L (ref 98–111)
GFR calc Af Amer: >60 mL/min (ref >60)
GFR calc non Af Amer: >60 mL/min (ref >60)
Glucose, Bld: 91 mg/dL (ref 70–99)
POTASSIUM: 3.9 mmol/L (ref 3.5–5.1)
Sodium: 142 mmol/L (ref 135–145)
TOTAL PROTEIN: 6.8 g/dL (ref 6.5–8.1)
Total Bilirubin: 0.8 mg/dL (ref 0.3–1.2)

## 2017-11-05 LAB — MAGNESIUM: MAGNESIUM: 2.1 mg/dL (ref 1.7–2.4)

## 2017-11-05 MED ORDER — STERILE WATER FOR INJECTION IJ SOLN
INTRAMUSCULAR | Status: AC
  Filled 2017-11-05: qty 10

## 2017-11-05 MED ORDER — SODIUM CHLORIDE 0.9 % IV SOLN
20.0000 mg/m2 | Freq: Once | INTRAVENOUS | Status: AC
  Administered 2017-11-05: 40 mg via INTRAVENOUS
  Filled 2017-11-05: qty 8

## 2017-11-05 MED ORDER — SODIUM CHLORIDE 0.9% FLUSH
10.0000 mL | INTRAVENOUS | Status: DC | PRN
  Administered 2017-11-05: 10 mL
  Filled 2017-11-05: qty 10

## 2017-11-05 MED ORDER — ALTEPLASE 2 MG IJ SOLR
INTRAMUSCULAR | Status: AC
  Filled 2017-11-05: qty 2

## 2017-11-05 MED ORDER — SODIUM CHLORIDE 0.9 % IV SOLN
Freq: Once | INTRAVENOUS | Status: AC
  Administered 2017-11-05: 10:00:00 via INTRAVENOUS

## 2017-11-05 MED ORDER — PROCHLORPERAZINE MALEATE 10 MG PO TABS
10.0000 mg | ORAL_TABLET | Freq: Once | ORAL | Status: AC
Start: 2017-11-05 — End: 2017-11-05
  Administered 2017-11-05: 10 mg via ORAL
  Filled 2017-11-05: qty 1

## 2017-11-05 MED ORDER — HEPARIN SOD (PORK) LOCK FLUSH 100 UNIT/ML IV SOLN
500.0000 [IU] | Freq: Once | INTRAVENOUS | Status: AC
  Administered 2017-11-05: 500 [IU] via INTRAVENOUS

## 2017-11-05 NOTE — Progress Notes (Signed)
Omar Lee, [place] [id number]   CLINIC:  Medical Oncology/Hematology  PCP:  [name], MD [place] [id number] [phone]   REASON FOR VISIT: Follow-up for AML  CURRENT THERAPY: Decitabine and venetoclax    BRIEF ONCOLOGIC HISTORY:    AML (acute myeloblastic leukemia) ([facility])   07/2016 Initial Diagnosis    MDS- BM BX with 8% blasts.  Cytogenetics with minus Y only.  IPSS-R 3.5, intermediate risk (0 for cyto, 2.0 for blasts, 1.5 for Hb, 0 for platelets, 0 for ANC).      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  FLT3-ITD/TKD negative.  AML panel positive for NRAS,STAG2,TET2, U2AF1, DNMT3A      07/13/2017 -  Chemotherapy    C1 decitabine 20 mg/m2 IV daily x5 days with venetoclax 400 mg daily.  Given Hydrea prior to starting when it collects due to leukocytosis.        INTERVAL HISTORY:  Omar Lee 77 y.o. male returns for routine follow-up for AML and consideration for next cycle of chemotherapy. Patient is here today feeling good. He had a port placed and his PICC line removed. He has very few complaints however does get fatigued from time to time. He could not recall the medications he is taking at home. Told us to call Otila Kluver is care giver to get the information. He denies any vomiting or diarrhea. He is stable on his feet and able to move around freely.       REVIEW OF SYSTEMS:  Review of Systems  Constitutional: Positive for fatigue.  HENT:  Negative.   Eyes: Negative.   Respiratory: Negative.   Cardiovascular: Negative.   Gastrointestinal: Positive for nausea.  Endocrine: Negative.   Genitourinary: Negative.    Skin:  Negative.   Neurological: Positive for dizziness and extremity weakness.  Hematological: Negative.   Psychiatric/Behavioral: Negative.      PAST MEDICAL/SURGICAL HISTORY:  Past Medical History:  Diagnosis Date  . Cancer ([facility])    MDS  . Hyperlipidemia   . Hypothyroidism   . Reflux   . Sleep apnea    Past Surgical History:  Procedure Laterality Date  . ESOPHAGOGASTRODUODENOSCOPY (EGD) WITH ESOPHAGEAL DILATION N/A 11/22/2012   Procedure: ESOPHAGOGASTRODUODENOSCOPY (EGD) WITH ESOPHAGEAL DILATION;  Surgeon: Rogene Houston, MD;  Location: [facility];  Service: Endoscopy;  Laterality: N/A;  1120  . HEMORRHOID SURGERY    . NASAL SEPTUM SURGERY    . PORTACATH PLACEMENT Left 11/02/2017   Procedure: INSERTION PORT WITH ATTACHED CATHETER LEFT SUBCLAVIAN;  Surgeon: Aviva Signs, MD;  Location: [facility] ORS;  Service: General;  Laterality: Left;  . SHOULDER SURGERY Right   . TONSILLECTOMY AND ADENOIDECTOMY       SOCIAL HISTORY:  Social History   Socioeconomic History  . Marital status: Divorced    Spouse name: Not on file  . Number of children: Not on file  . Years of education: Not on file  . Highest education level: Not on file  Occupational History  . Not on file  Social Needs  . Financial resource strain: Not on file  . Food insecurity:    Worry: Not on file  Inability: Not on file  . Transportation needs:    Medical: Not on file    Non-medical: Not on file  Tobacco Use  . Smoking status: Never Smoker  . Smokeless tobacco: Never Used  Substance and Sexual Activity  . Alcohol use: No  . Drug use: No  . Sexual activity: Not Currently    Birth control/protection: None  Lifestyle  . Physical activity:    Days per week: Not on file    Minutes per session: Not on file  . Stress: Not on file  Relationships  . Social connections:    Talks on phone: Not on file    Gets together: Not on file    Attends religious service: Not on file    Active member of club or  organization: Not on file    Attends meetings of clubs or organizations: Not on file    Relationship status: Not on file  . Intimate partner violence:    Fear of current or ex partner: Not on file    Emotionally abused: Not on file    Physically abused: Not on file    Forced sexual activity: Not on file  Other Topics Concern  . Not on file  Social History Narrative  . Not on file    FAMILY HISTORY:  Family History  Problem Relation Age of Onset  . Hypertension Mother   . Heart attack Mother   . Heart attack Father   . Diabetes Brother     CURRENT MEDICATIONS:  Outpatient Encounter Medications as of 11/05/2017  Medication Sig Note  . acetaminophen (TYLENOL) 325 MG tablet Take 650 mg by mouth as needed.   Marland Kitchen allopurinol (ZYLOPRIM) 300 MG tablet Take 1 tablet (300 mg total) by mouth daily.   . fluconazole (DIFLUCAN) 200 MG tablet    . levothyroxine (SYNTHROID, LEVOTHROID) 125 MCG tablet TAKE 1 TABLET DAILY (NEED OFFICE VISIT)   . pantoprazole (PROTONIX) 40 MG tablet    . traMADol (ULTRAM) 50 MG tablet Take 100 mg by mouth 2 (two) times daily.   . VENCLEXTA 100 MG TABS  09/11/2017: Per Dr. Florene Glen stop 09/09/17 until further notice  . [DISCONTINUED] doxycycline (VIBRA-TABS) 100 MG tablet    . [DISCONTINUED] hydroxyurea (HYDREA) 500 MG capsule Take 1 capsule (500 mg total) by mouth 2 (two) times daily. May take with food to minimize GI side effects.    Facility-Administered Encounter Medications as of 11/05/2017  Medication  . 0.9 %  sodium chloride infusion  . acetaminophen (TYLENOL) tablet 650 mg  . diphenhydrAMINE (BENADRYL) capsule 25 mg  . heparin lock flush 100 unit/mL  . sodium chloride flush (NS) 0.9 % injection 10 mL  . sodium chloride flush (NS) 0.9 % injection 10 mL  . sodium chloride flush (NS) 0.9 % injection 10 mL  . sodium chloride flush (NS) 0.9 % injection 10 mL    ALLERGIES:  No Known Allergies   PHYSICAL EXAM:  ECOG Performance status: 1  VITAL SIGNS: BP  121/71, P: 73, R:18, TEMP: 98.3, 02:97%  Physical Exam HEENT: Oropharynx has no mucositis or thrush. Chest: Bilaterally clear to auscultation. Cardiovascular: S1-S2 regular rate and rhythm. Extremities: No edema or cyanosis.  No bruising.  LABORATORY DATA:  I have reviewed the labs as listed.  CBC    Component Value Date/Time   WBC 4.3 11/05/2017 0809   RBC 3.28 (L) 11/05/2017 0809   HGB 10.9 (L) 11/05/2017 0809   HGB 6.7 (LL) 06/23/2016 0825   HCT  33.5 (L) 11/05/2017 0809   HCT 20.1 (L) 06/23/2016 0825   PLT 236 11/05/2017 0809   PLT 182 06/23/2016 0825   MCV 102.1 (H) 11/05/2017 0809   MCV 106 (H) 06/23/2016 0825   MCH 33.2 11/05/2017 0809   MCHC 32.5 11/05/2017 0809   RDW 20.2 (H) 11/05/2017 0809   RDW 16.4 (H) 06/23/2016 0825   LYMPHSABS 0.6 (L) 11/05/2017 0809   LYMPHSABS 0.9 06/23/2016 0825   MONOABS 0.6 11/05/2017 0809   EOSABS 0.0 11/05/2017 0809   EOSABS 0.0 06/23/2016 0825   BASOSABS 0.0 11/05/2017 0809   BASOSABS 0.0 06/23/2016 0825   CMP Latest Ref Rng & Units 11/05/2017 10/29/2017 10/22/2017  Glucose 70 - 99 mg/dL 91 106(H) 102(H)  BUN 8 - 23 mg/dL 14 16 17   Creatinine 0.61 - 1.24 mg/dL 1.02 1.13 1.02  Sodium 135 - 145 mmol/L 142 141 141  Potassium 3.5 - 5.1 mmol/L 3.9 3.8 3.9  Chloride 98 - 111 mmol/L 106 107 106  CO2 22 - 32 mmol/L 29 25 26   Calcium 8.9 - 10.3 mg/dL 9.0 8.9 9.1  Total Protein 6.5 - 8.1 g/dL 6.8 6.5 6.7  Total Bilirubin 0.3 - 1.2 mg/dL 0.8 1.1 1.0  Alkaline Phos 38 - 126 U/L 67 63 67  AST 15 - 41 U/L 20 21 21   ALT 0 - 44 U/L 21 24 25          ASSESSMENT & PLAN:   AML (acute myeloblastic leukemia) ([facility]) 1.  AML with myelodysplasia related changes:  - Cycle 1 of decitabine 20 mg/m2 IV x5 days, along with venetoclax 400 mg daily started on 07/13/2017 - bone marrow on 08/07/2017 shows decrease in bone marrow blasts to around 36%.  Initial biopsy showed 86% blasts. - Cycle 2 decitabine on 08/12/2017, requiring weekly transfusions.  He  had bone marrow biopsy done on 09/04/2017 which shows hypocellular with no excess blasts. - Cycle 3 of decitabine (20 mg per M square) every 6 weeks was given on 09/24/2017.  He tolerated it very well.  Today his blood counts are close to normal.  He will proceed with cycle 4 today.  Venetoclax has been on hold since July 4.  This was held due to diarrhea.  He will start back on venetoclax 100 mg daily.  2.  Access: He had a Port-A-Cath placed on the left chest wall on 11/02/2017.  His PICC line was discontinued.  3.  ID: Acyclovir, Diflucan and Levaquin are on hold at this time.      Orders placed this encounter:  Orders Placed This Encounter  Procedures  . CBC with Differential/Platelet  . Comprehensive metabolic panel      Derek Jack, MD [place] [phone]

## 2017-11-05 NOTE — Progress Notes (Signed)
Patient to treatment room after oncology follow up visit.  Ok to treat today after lab review per oncologist.  Spoke with Otila Kluver, caregiver, on the phone to review medications and list updated.  No complaints voiced by the patient.  Port site clean and dry with no swelling noted at site.  Surgical glue intact.  No complaints of pain with flush and good blood return noted.   Patient tolerated chemotherapy with no complaints voiced.  Port site clean and dry with no bruising or swelling noted.  Good blood return noted before and after administration of therapy.  Band aid applied.  VSS with discharge and left ambulatory with no s/s of distress noted.

## 2017-11-05 NOTE — Patient Instructions (Addendum)
[place] at [facility] Discharge Instruction  Patient need to start is his Venetoclax 200mg  back today. Follow up in 6 weeks with labs.    You were seen today by Dr. Delton Coombes.  Thank you for choosing [place] at [facility] to provide your oncology and hematology care.  To afford each patient quality time with our provider, please arrive at least 15 minutes before your scheduled appointment time.   If you have a lab appointment with the [facility] please come in thru the  Main Entrance and check in at the main information desk  You need to re-schedule your appointment should you arrive 10 or more minutes late.  We strive to give you quality time with our providers, and arriving late affects you and other patients whose appointments are after yours.  Also, if you no show three or more times for appointments you may be dismissed from the clinic at the providers discretion.     Again, thank you for choosing [facility].  Our hope is that these requests will decrease the amount of time that you wait before being seen by our physicians.       _____________________________________________________________  Should you have questions after your visit to [facility], please contact our office at [phone] between the hours of 8:30 a.m. and 4:30 p.m.  Voicemails left after 4:30 p.m. will not be returned until the following business day.  For prescription refill requests, have your pharmacy contact our office.       Resources For Cancer Patients and their Caregivers ? American Cancer Society: Can assist with transportation, wigs, general needs, runs Look Good Feel Better.        (801)049-3229 ? Cancer Care: Provides financial assistance, online support groups, medication/co-pay assistance.  1-800-813-HOPE 484-808-9909) ? [facility] Assists [place] cancer patients and their families through  emotional , educational and financial support.  671-600-3209 ? [place] DSS Where to apply for food stamps, Medicaid and utility assistance. 307-496-8697 ? RCATS: Transportation to medical appointments. (650) 724-1858 ? Social Security Administration: May apply for disability if have a Stage IV cancer. 581-255-4213 (301)638-7725 ? [employer], Disability and Transit Services: Assists with nutrition, care and transit needs. [facility] Support Programs:   > Cancer Support Group  2nd Tuesday of the month 1pm-2pm, Journey Room   > Creative Journey  3rd Tuesday of the month 1130am-1pm, Journey Room

## 2017-11-05 NOTE — Assessment & Plan Note (Addendum)
1.  AML with myelodysplasia related changes:  - Cycle 1 of decitabine 20 mg/m2 IV x5 days, along with venetoclax 400 mg daily started on 07/13/2017 - bone marrow on 08/07/2017 shows decrease in bone marrow blasts to around 36%.  Initial biopsy showed 86% blasts. - Cycle 2 decitabine on 08/12/2017, requiring weekly transfusions.  He had bone marrow biopsy done on 09/04/2017 which shows hypocellular with no excess blasts. - Cycle 3 of decitabine (20 mg per M square) every 6 weeks was given on 09/24/2017.  He tolerated it very well.  Today his blood counts are close to normal.  He will proceed with cycle 4 today.  Venetoclax has been on hold since July 4.  This was held due to diarrhea.  He will start back on venetoclax 100 mg daily.  2.  Access: He had a Port-A-Cath placed on the left chest wall on 11/02/2017.  His PICC line was discontinued.  3.  ID: Acyclovir, Diflucan and Levaquin are on hold at this time.

## 2017-11-05 NOTE — Patient Instructions (Signed)
[facility] Discharge Instructions for Patients Receiving Chemotherapy  Today you received the following chemotherapy agents dacogen.    If you develop nausea and vomiting that is not controlled by your nausea medication, call the clinic.   BELOW ARE SYMPTOMS THAT SHOULD BE REPORTED IMMEDIATELY:  *FEVER GREATER THAN 100.5 F  *CHILLS WITH OR WITHOUT FEVER  NAUSEA AND VOMITING THAT IS NOT CONTROLLED WITH YOUR NAUSEA MEDICATION  *UNUSUAL SHORTNESS OF BREATH  *UNUSUAL BRUISING OR BLEEDING  TENDERNESS IN MOUTH AND THROAT WITH OR WITHOUT PRESENCE OF ULCERS  *URINARY PROBLEMS  *BOWEL PROBLEMS  UNUSUAL RASH Items with * indicate a potential emergency and should be followed up as soon as possible.  Feel free to call the clinic should you have any questions or concerns. The clinic phone number is [phone].  Please show the CHEMO ALERT CARD at check-in to the Emergency Department and triage nurse.   

## 2017-11-06 ENCOUNTER — Inpatient Hospital Stay (HOSPITAL_COMMUNITY): Payer: Medicare HMO

## 2017-11-06 VITALS — BP 127/55 | HR 65 | Temp 98.6°F | Resp 18

## 2017-11-06 DIAGNOSIS — C9200 Acute myeloblastic leukemia, not having achieved remission: Secondary | ICD-10-CM

## 2017-11-06 DIAGNOSIS — R5383 Other fatigue: Secondary | ICD-10-CM | POA: Diagnosis not present

## 2017-11-06 DIAGNOSIS — Z5111 Encounter for antineoplastic chemotherapy: Secondary | ICD-10-CM | POA: Diagnosis not present

## 2017-11-06 MED ORDER — PROCHLORPERAZINE MALEATE 10 MG PO TABS
10.0000 mg | ORAL_TABLET | Freq: Once | ORAL | Status: AC
  Administered 2017-11-06: 10 mg via ORAL
  Filled 2017-11-06: qty 1

## 2017-11-06 MED ORDER — SODIUM CHLORIDE 0.9 % IV SOLN
20.0000 mg/m2 | Freq: Once | INTRAVENOUS | Status: AC
  Administered 2017-11-06: 40 mg via INTRAVENOUS
  Filled 2017-11-06: qty 8

## 2017-11-06 MED ORDER — SODIUM CHLORIDE 0.9 % IV SOLN
Freq: Once | INTRAVENOUS | Status: AC
  Administered 2017-11-06: 09:00:00 via INTRAVENOUS

## 2017-11-07 ENCOUNTER — Encounter (HOSPITAL_COMMUNITY): Payer: Self-pay

## 2017-11-07 ENCOUNTER — Inpatient Hospital Stay (HOSPITAL_COMMUNITY): Payer: Medicare HMO

## 2017-11-07 VITALS — BP 119/55 | HR 67 | Temp 98.6°F | Resp 18 | Wt 171.2 lb

## 2017-11-07 DIAGNOSIS — C9200 Acute myeloblastic leukemia, not having achieved remission: Secondary | ICD-10-CM

## 2017-11-07 DIAGNOSIS — R5383 Other fatigue: Secondary | ICD-10-CM | POA: Diagnosis not present

## 2017-11-07 DIAGNOSIS — Z5111 Encounter for antineoplastic chemotherapy: Secondary | ICD-10-CM | POA: Diagnosis not present

## 2017-11-07 MED ORDER — HEPARIN SOD (PORK) LOCK FLUSH 100 UNIT/ML IV SOLN: 500.0000 [IU] | Freq: Once | INTRAVENOUS | Status: DC | PRN

## 2017-11-07 MED ORDER — SODIUM CHLORIDE 0.9% FLUSH
10.0000 mL | INTRAVENOUS | Status: DC | PRN
  Administered 2017-11-07: 10 mL
  Filled 2017-11-07: qty 10

## 2017-11-07 MED ORDER — SODIUM CHLORIDE 0.9 % IV SOLN
Freq: Once | INTRAVENOUS | Status: AC
  Administered 2017-11-07: 09:00:00 via INTRAVENOUS

## 2017-11-07 MED ORDER — TRAMADOL HCL 50 MG PO TABS: 100.0000 mg | ORAL_TABLET | Freq: Two times a day (BID) | ORAL | 0 refills | Status: DC

## 2017-11-07 MED ORDER — HEPARIN SOD (PORK) LOCK FLUSH 100 UNIT/ML IV SOLN
INTRAVENOUS | Status: AC
  Filled 2017-11-07: qty 5

## 2017-11-07 MED ORDER — SODIUM CHLORIDE 0.9 % IV SOLN
20.0000 mg/m2 | Freq: Once | INTRAVENOUS | Status: AC
  Administered 2017-11-07: 40 mg via INTRAVENOUS
  Filled 2017-11-07: qty 8

## 2017-11-07 MED ORDER — PROCHLORPERAZINE MALEATE 10 MG PO TABS
10.0000 mg | ORAL_TABLET | Freq: Once | ORAL | Status: AC
  Administered 2017-11-07: 10 mg via ORAL
  Filled 2017-11-07: qty 1

## 2017-11-07 NOTE — Progress Notes (Signed)
Omar Lee tolerated Decitabine infusion well without complaints or incident. Port left accessed for use tomorrow. VSS upon discharge. Pt discharged self ambulatory in satisfactory condition

## 2017-11-07 NOTE — Patient Instructions (Signed)
[facility] Discharge Instructions for Patients Receiving Chemotherapy   Beginning January 23rd 2017 lab work for the Cancer Center will be done in the  Main lab at [place] on 1st floor. If you have a lab appointment with the Cancer Center please come in thru the  Main Entrance and check in at the main information desk   Today you received the following chemotherapy agents Decitabine. Follow-up as scheduled. Call clinic for any questions or concerns  To help prevent nausea and vomiting after your treatment, we encourage you to take your nausea medication   If you develop nausea and vomiting, or diarrhea that is not controlled by your medication, call the clinic.  The clinic phone number is [phone]. Office hours are Monday-Friday 8:30am-5:00pm.  BELOW ARE SYMPTOMS THAT SHOULD BE REPORTED IMMEDIATELY:  *FEVER GREATER THAN 101.0 F  *CHILLS WITH OR WITHOUT FEVER  NAUSEA AND VOMITING THAT IS NOT CONTROLLED WITH YOUR NAUSEA MEDICATION  *UNUSUAL SHORTNESS OF BREATH  *UNUSUAL BRUISING OR BLEEDING  TENDERNESS IN MOUTH AND THROAT WITH OR WITHOUT PRESENCE OF ULCERS  *URINARY PROBLEMS  *BOWEL PROBLEMS  UNUSUAL RASH Items with * indicate a potential emergency and should be followed up as soon as possible. If you have an emergency after office hours please contact your primary care physician or go to the nearest emergency department.  Please call the clinic during office hours if you have any questions or concerns.   You may also contact the Patient Navigator at [phone] should you have any questions or need assistance in obtaining follow up care.      Resources For Cancer Patients and their Caregivers ? American Cancer Society: Can assist with transportation, wigs, general needs, runs Look Good Feel Better.        1-888-227-6333 ? Cancer Care: Provides financial assistance, online support groups, medication/co-pay assistance.  1-800-813-HOPE  (4673) ? [facility] Assists [place] cancer patients and their families through emotional , educational and financial support.  336-427-4357 ? [place] DSS Where to apply for food stamps, Medicaid and utility assistance. 336-342-1394 ? RCATS: Transportation to medical appointments. 336-347-2287 ? Social Security Administration: May apply for disability if have a Stage IV cancer. 336-342-7796 1-800-772-1213 ? [place] Aging, Disability and Transit Services: Assists with nutrition, care and transit needs. 336-349-2343         

## 2017-11-08 ENCOUNTER — Ambulatory Visit (HOSPITAL_COMMUNITY): Payer: Medicare HMO | Admitting: Hematology

## 2017-11-08 ENCOUNTER — Inpatient Hospital Stay (HOSPITAL_COMMUNITY): Payer: Medicare HMO

## 2017-11-08 ENCOUNTER — Other Ambulatory Visit (HOSPITAL_COMMUNITY): Payer: Medicare HMO

## 2017-11-08 VITALS — BP 124/62 | HR 66 | Temp 97.8°F | Resp 18

## 2017-11-08 DIAGNOSIS — Z5111 Encounter for antineoplastic chemotherapy: Secondary | ICD-10-CM | POA: Diagnosis not present

## 2017-11-08 DIAGNOSIS — R5383 Other fatigue: Secondary | ICD-10-CM | POA: Diagnosis not present

## 2017-11-08 DIAGNOSIS — C9200 Acute myeloblastic leukemia, not having achieved remission: Secondary | ICD-10-CM | POA: Diagnosis not present

## 2017-11-08 MED ORDER — SODIUM CHLORIDE 0.9% FLUSH
10.0000 mL | INTRAVENOUS | Status: DC | PRN
  Administered 2017-11-08: 10 mL
  Filled 2017-11-08: qty 10

## 2017-11-08 MED ORDER — SODIUM CHLORIDE 0.9 % IV SOLN
20.0000 mg/m2 | Freq: Once | INTRAVENOUS | Status: AC
  Administered 2017-11-08: 40 mg via INTRAVENOUS
  Filled 2017-11-08: qty 8

## 2017-11-08 MED ORDER — PROCHLORPERAZINE MALEATE 10 MG PO TABS
10.0000 mg | ORAL_TABLET | Freq: Once | ORAL | Status: AC
  Administered 2017-11-08: 10 mg via ORAL

## 2017-11-08 MED ORDER — PROCHLORPERAZINE MALEATE 10 MG PO TABS
ORAL_TABLET | ORAL | Status: AC
  Filled 2017-11-08: qty 1

## 2017-11-08 MED ORDER — SODIUM CHLORIDE 0.9 % IV SOLN
Freq: Once | INTRAVENOUS | Status: AC
  Administered 2017-11-08: 09:00:00 via INTRAVENOUS

## 2017-11-08 NOTE — Patient Instructions (Signed)
[facility] Discharge Instructions for Patients Receiving Chemotherapy   Beginning January 23rd 2017 lab work for the Cancer Center will be done in the  Main lab at [facility] on 1st floor. If you have a lab appointment with the Cancer Center please come in thru the  Main Entrance and check in at the main information desk   Today you received the following chemotherapy agents   To help prevent nausea and vomiting after your treatment, we encourage you to take your nausea medication     If you develop nausea and vomiting, or diarrhea that is not controlled by your medication, call the clinic.  The clinic phone number is [phone]. Office hours are Monday-Friday 8:30am-5:00pm.  BELOW ARE SYMPTOMS THAT SHOULD BE REPORTED IMMEDIATELY:  *FEVER GREATER THAN 101.0 F  *CHILLS WITH OR WITHOUT FEVER  NAUSEA AND VOMITING THAT IS NOT CONTROLLED WITH YOUR NAUSEA MEDICATION  *UNUSUAL SHORTNESS OF BREATH  *UNUSUAL BRUISING OR BLEEDING  TENDERNESS IN MOUTH AND THROAT WITH OR WITHOUT PRESENCE OF ULCERS  *URINARY PROBLEMS  *BOWEL PROBLEMS  UNUSUAL RASH Items with * indicate a potential emergency and should be followed up as soon as possible. If you have an emergency after office hours please contact your primary care physician or go to the nearest emergency department.  Please call the clinic during office hours if you have any questions or concerns.   You may also contact the Patient Navigator at [phone] should you have any questions or need assistance in obtaining follow up care.      Resources For Cancer Patients and their Caregivers ? American Cancer Society: Can assist with transportation, wigs, general needs, runs Look Good Feel Better.        1-888-227-6333 ? Cancer Care: Provides financial assistance, online support groups, medication/co-pay assistance.  1-800-813-HOPE (4673) ? [facility] Assists [place] cancer  patients and their families through emotional , educational and financial support.  336-427-4357 ? [place] DSS Where to apply for food stamps, Medicaid and utility assistance. 336-342-1394 ? RCATS: Transportation to medical appointments. 336-347-2287 ? Social Security Administration: May apply for disability if have a Stage IV cancer. 336-342-7796 1-800-772-1213 ? [place] Aging, Disability and Transit Services: Assists with nutrition, care and transit needs. 336-349-2343         

## 2017-11-08 NOTE — Progress Notes (Signed)
Treatment given per orders. Patient tolerated it well without problems. Vitals stable and discharged home from clinic ambulatory. Follow up as scheduled.  

## 2017-11-09 ENCOUNTER — Inpatient Hospital Stay (HOSPITAL_COMMUNITY): Payer: Medicare HMO

## 2017-11-09 VITALS — BP 153/61 | HR 62 | Temp 98.6°F | Resp 18

## 2017-11-09 DIAGNOSIS — Z5111 Encounter for antineoplastic chemotherapy: Secondary | ICD-10-CM | POA: Diagnosis not present

## 2017-11-09 DIAGNOSIS — R5383 Other fatigue: Secondary | ICD-10-CM | POA: Diagnosis not present

## 2017-11-09 DIAGNOSIS — C9200 Acute myeloblastic leukemia, not having achieved remission: Secondary | ICD-10-CM

## 2017-11-09 LAB — CBC WITH DIFFERENTIAL/PLATELET
BASOS ABS: 0 10*3/uL (ref 0.0–0.1)
BASOS PCT: 0 %
Eosinophils Absolute: 0 10*3/uL (ref 0.0–0.7)
Eosinophils Relative: 0 %
HCT: 32 % — ABNORMAL LOW (ref 39.0–52.0)
HEMOGLOBIN: 10.3 g/dL — AB (ref 13.0–17.0)
Lymphocytes Relative: 12 %
Lymphs Abs: 0.4 10*3/uL — ABNORMAL LOW (ref 0.7–4.0)
MCH: 32.6 pg (ref 26.0–34.0)
MCHC: 32.2 g/dL (ref 30.0–36.0)
MCV: 101.3 fL — ABNORMAL HIGH (ref 78.0–100.0)
Monocytes Absolute: 0.2 10*3/uL (ref 0.1–1.0)
Monocytes Relative: 7 %
NEUTROS PCT: 81 %
Neutro Abs: 2.4 10*3/uL (ref 1.7–7.7)
PLATELETS: 251 10*3/uL (ref 150–400)
RBC: 3.16 MIL/uL — ABNORMAL LOW (ref 4.22–5.81)
RDW: 19.2 % — ABNORMAL HIGH (ref 11.5–15.5)
WBC: 2.9 10*3/uL — ABNORMAL LOW (ref 4.0–10.5)

## 2017-11-09 LAB — COMPREHENSIVE METABOLIC PANEL
ALK PHOS: 80 U/L (ref 38–126)
ALT: 26 U/L (ref 0–44)
ANION GAP: 8 (ref 5–15)
AST: 24 U/L (ref 15–41)
Albumin: 3.8 g/dL (ref 3.5–5.0)
BILIRUBIN TOTAL: 1.3 mg/dL — AB (ref 0.3–1.2)
BUN: 15 mg/dL (ref 8–23)
CALCIUM: 8.8 mg/dL — AB (ref 8.9–10.3)
CO2: 26 mmol/L (ref 22–32)
Chloride: 102 mmol/L (ref 98–111)
Creatinine, Ser: 0.85 mg/dL (ref 0.61–1.24)
Glucose, Bld: 127 mg/dL — ABNORMAL HIGH (ref 70–99)
Potassium: 3.8 mmol/L (ref 3.5–5.1)
Sodium: 136 mmol/L (ref 135–145)
TOTAL PROTEIN: 6.5 g/dL (ref 6.5–8.1)

## 2017-11-09 LAB — MAGNESIUM: MAGNESIUM: 2 mg/dL (ref 1.7–2.4)

## 2017-11-09 MED ORDER — PROCHLORPERAZINE MALEATE 10 MG PO TABS
ORAL_TABLET | ORAL | Status: AC
  Filled 2017-11-09: qty 1

## 2017-11-09 MED ORDER — SODIUM CHLORIDE 0.9% FLUSH
10.0000 mL | INTRAVENOUS | Status: DC | PRN
  Administered 2017-11-09: 10 mL
  Filled 2017-11-09: qty 10

## 2017-11-09 MED ORDER — PROCHLORPERAZINE MALEATE 10 MG PO TABS
10.0000 mg | ORAL_TABLET | Freq: Once | ORAL | Status: AC
  Administered 2017-11-09: 10 mg via ORAL

## 2017-11-09 MED ORDER — SODIUM CHLORIDE 0.9 % IV SOLN
Freq: Once | INTRAVENOUS | Status: AC
  Administered 2017-11-09: 09:00:00 via INTRAVENOUS

## 2017-11-09 MED ORDER — HEPARIN SOD (PORK) LOCK FLUSH 100 UNIT/ML IV SOLN
INTRAVENOUS | Status: AC
  Filled 2017-11-09: qty 5

## 2017-11-09 MED ORDER — HEPARIN SOD (PORK) LOCK FLUSH 100 UNIT/ML IV SOLN
500.0000 [IU] | Freq: Once | INTRAVENOUS | Status: AC | PRN
  Administered 2017-11-09: 500 [IU]

## 2017-11-09 MED ORDER — SODIUM CHLORIDE 0.9 % IV SOLN
20.0000 mg/m2 | Freq: Once | INTRAVENOUS | Status: AC
  Administered 2017-11-09: 40 mg via INTRAVENOUS
  Filled 2017-11-09: qty 8

## 2017-11-09 NOTE — Patient Instructions (Signed)
[facility] Discharge Instructions for Patients Receiving Chemotherapy   Beginning January 23rd 2017 lab work for the Cancer Center will be done in the  Main lab at [facility] on 1st floor. If you have a lab appointment with the Cancer Center please come in thru the  Main Entrance and check in at the main information desk   Today you received the following chemotherapy agents Decitabine. Follow-up as scheduled. Call clinic for any questions or concerns  To help prevent nausea and vomiting after your treatment, we encourage you to take your nausea medication   If you develop nausea and vomiting, or diarrhea that is not controlled by your medication, call the clinic.  The clinic phone number is [phone]. Office hours are Monday-Friday 8:30am-5:00pm.  BELOW ARE SYMPTOMS THAT SHOULD BE REPORTED IMMEDIATELY:  *FEVER GREATER THAN 101.0 F  *CHILLS WITH OR WITHOUT FEVER  NAUSEA AND VOMITING THAT IS NOT CONTROLLED WITH YOUR NAUSEA MEDICATION  *UNUSUAL SHORTNESS OF BREATH  *UNUSUAL BRUISING OR BLEEDING  TENDERNESS IN MOUTH AND THROAT WITH OR WITHOUT PRESENCE OF ULCERS  *URINARY PROBLEMS  *BOWEL PROBLEMS  UNUSUAL RASH Items with * indicate a potential emergency and should be followed up as soon as possible. If you have an emergency after office hours please contact your primary care physician or go to the nearest emergency department.  Please call the clinic during office hours if you have any questions or concerns.   You may also contact the Patient Navigator at [phone] should you have any questions or need assistance in obtaining follow up care.      Resources For Cancer Patients and their Caregivers ? American Cancer Society: Can assist with transportation, wigs, general needs, runs Look Good Feel Better.        1-888-227-6333 ? Cancer Care: Provides financial assistance, online support groups, medication/co-pay assistance.  1-800-813-HOPE  (4673) ? [facility] Assists [place] cancer patients and their families through emotional , educational and financial support.  336-427-4357 ? [place] DSS Where to apply for food stamps, Medicaid and utility assistance. 336-342-1394 ? RCATS: Transportation to medical appointments. 336-347-2287 ? Social Security Administration: May apply for disability if have a Stage IV cancer. 336-342-7796 1-800-772-1213 ? [place] Aging, Disability and Transit Services: Assists with nutrition, care and transit needs. 336-349-2343         

## 2017-11-09 NOTE — Progress Notes (Signed)
.  Darcel Smalling tolerated Decitabine infusion well without complaints or incident.Labs faxed to [facility]. VSS Pt discharged self ambulatory in satisfactory condition

## 2017-11-13 ENCOUNTER — Inpatient Hospital Stay (HOSPITAL_COMMUNITY): Payer: Medicare HMO

## 2017-11-13 VITALS — BP 112/95 | HR 84 | Temp 98.5°F | Resp 18 | Wt 171.0 lb

## 2017-11-13 DIAGNOSIS — C9200 Acute myeloblastic leukemia, not having achieved remission: Secondary | ICD-10-CM | POA: Diagnosis not present

## 2017-11-13 DIAGNOSIS — R5383 Other fatigue: Secondary | ICD-10-CM | POA: Diagnosis not present

## 2017-11-13 DIAGNOSIS — Z5111 Encounter for antineoplastic chemotherapy: Secondary | ICD-10-CM | POA: Diagnosis not present

## 2017-11-13 LAB — CBC WITH DIFFERENTIAL/PLATELET
BASOS ABS: 0 10*3/uL (ref 0.0–0.1)
Basophils Relative: 0 %
EOS PCT: 0 %
Eosinophils Absolute: 0 10*3/uL (ref 0.0–0.7)
HEMATOCRIT: 31.4 % — AB (ref 39.0–52.0)
HEMOGLOBIN: 10.3 g/dL — AB (ref 13.0–17.0)
LYMPHS PCT: 11 %
Lymphs Abs: 0.3 10*3/uL — ABNORMAL LOW (ref 0.7–4.0)
MCH: 33.3 pg (ref 26.0–34.0)
MCHC: 32.8 g/dL (ref 30.0–36.0)
MCV: 101.6 fL — AB (ref 78.0–100.0)
Monocytes Absolute: 0.1 10*3/uL (ref 0.1–1.0)
Monocytes Relative: 2 %
NEUTROS ABS: 2.5 10*3/uL (ref 1.7–7.7)
Neutrophils Relative %: 87 %
Platelets: 178 10*3/uL (ref 150–400)
RBC: 3.09 MIL/uL — AB (ref 4.22–5.81)
RDW: 18.7 % — ABNORMAL HIGH (ref 11.5–15.5)
WBC: 2.9 10*3/uL — AB (ref 4.0–10.5)

## 2017-11-13 LAB — COMPREHENSIVE METABOLIC PANEL
ALBUMIN: 4 g/dL (ref 3.5–5.0)
ALT: 46 U/L — ABNORMAL HIGH (ref 0–44)
AST: 33 U/L (ref 15–41)
Alkaline Phosphatase: 81 U/L (ref 38–126)
Anion gap: 9 (ref 5–15)
BUN: 15 mg/dL (ref 8–23)
CO2: 24 mmol/L (ref 22–32)
Calcium: 8.9 mg/dL (ref 8.9–10.3)
Chloride: 106 mmol/L (ref 98–111)
Creatinine, Ser: 0.92 mg/dL (ref 0.61–1.24)
GFR calc Af Amer: >60 mL/min (ref >60)
Glucose, Bld: 118 mg/dL — ABNORMAL HIGH (ref 70–99)
POTASSIUM: 3.7 mmol/L (ref 3.5–5.1)
Sodium: 139 mmol/L (ref 135–145)
TOTAL PROTEIN: 6.6 g/dL (ref 6.5–8.1)
Total Bilirubin: 0.8 mg/dL (ref 0.3–1.2)

## 2017-11-13 LAB — MAGNESIUM: MAGNESIUM: 1.9 mg/dL (ref 1.7–2.4)

## 2017-11-13 MED ORDER — SODIUM CHLORIDE 0.9% FLUSH
20.0000 mL | INTRAVENOUS | Status: DC | PRN
  Administered 2017-11-13: 20 mL
  Filled 2017-11-13: qty 20

## 2017-11-13 MED ORDER — HEPARIN SOD (PORK) LOCK FLUSH 100 UNIT/ML IV SOLN
500.0000 [IU] | Freq: Once | INTRAVENOUS | Status: AC
  Administered 2017-11-13: 500 [IU] via INTRAVENOUS

## 2017-11-13 NOTE — Patient Instructions (Signed)
[place] at [facility]  Discharge Instructions:  You had lab work drawn today and your port flushed. Follow up as scheduled. Call clinic for any questions or concerns. _______________________________________________________________  Thank you for choosing [place] at [facility] to provide your oncology and hematology care.  To afford each patient quality time with our providers, please arrive at least 15 minutes before your scheduled appointment.  You need to re-schedule your appointment if you arrive 10 or more minutes late.  We strive to give you quality time with our providers, and arriving late affects you and other patients whose appointments are after yours.  Also, if you no show three or more times for appointments you may be dismissed from the clinic.  Again, thank you for choosing [facility] hope is that these requests will allow you access to exceptional care and in a timely manner. _______________________________________________________________  If you have questions after your visit, please contact our office at [phone] between the hours of 8:30 a.m. and 5:00 p.m. Voicemails left after 4:30 p.m. will not be returned until the following business day. _______________________________________________________________  For prescription refill requests, have your pharmacy contact our office. _______________________________________________________________  Recommendations made by the consultant and any test results will be sent to your referring physician. _______________________________________________________________

## 2017-11-13 NOTE — Progress Notes (Signed)
Omar Lee tolerated port lab draw with flush well without complaints or incident. Labs  reviewed and faxed to [facility] according to orders received from Dr. Florene Glen.Pt continues to take his Venclexta as prescribed without issues. VSS Pt discharged self ambulatory in satisfactory condition

## 2017-11-14 ENCOUNTER — Encounter (HOSPITAL_COMMUNITY): Payer: Self-pay | Admitting: *Deleted

## 2017-11-14 NOTE — Progress Notes (Signed)
Patient came to clinic today asking for a dressing on his port a cath.  Education provided on care of port.  I explained to patient that his incision is now healed and he does not require a dressing over it.  He states" I just don't like looking at it and it hurts when I sleep.  I would be more comfortable with something over it."  I told the patient that we would put a dressing over it today but he needs to take it off in 1-2 days and clean the area.  It doesn't need a dressing at all.  His incision is clean dry intact. No redness noted at all.  Sterile 2x2 and occlusive dressing applied.  Patient verbalizes understanding that he needs to remove dressing.

## 2017-11-19 ENCOUNTER — Encounter (HOSPITAL_COMMUNITY): Payer: Self-pay

## 2017-11-19 ENCOUNTER — Other Ambulatory Visit: Payer: Self-pay

## 2017-11-19 ENCOUNTER — Inpatient Hospital Stay (HOSPITAL_COMMUNITY): Payer: Medicare HMO | Attending: Oncology

## 2017-11-19 VITALS — BP 119/62 | HR 62 | Temp 98.4°F | Resp 18 | Wt 173.8 lb

## 2017-11-19 DIAGNOSIS — C9200 Acute myeloblastic leukemia, not having achieved remission: Secondary | ICD-10-CM | POA: Insufficient documentation

## 2017-11-19 LAB — COMPREHENSIVE METABOLIC PANEL
ALK PHOS: 82 U/L (ref 38–126)
ALT: 60 U/L — AB (ref 0–44)
AST: 33 U/L (ref 15–41)
Albumin: 3.9 g/dL (ref 3.5–5.0)
Anion gap: 4 — ABNORMAL LOW (ref 5–15)
BUN: 11 mg/dL (ref 8–23)
CALCIUM: 8.6 mg/dL — AB (ref 8.9–10.3)
CO2: 28 mmol/L (ref 22–32)
CREATININE: 0.85 mg/dL (ref 0.61–1.24)
Chloride: 106 mmol/L (ref 98–111)
Glucose, Bld: 112 mg/dL — ABNORMAL HIGH (ref 70–99)
Potassium: 3.5 mmol/L (ref 3.5–5.1)
Sodium: 138 mmol/L (ref 135–145)
Total Bilirubin: 0.7 mg/dL (ref 0.3–1.2)
Total Protein: 6.2 g/dL — ABNORMAL LOW (ref 6.5–8.1)

## 2017-11-19 LAB — CBC WITH DIFFERENTIAL/PLATELET
BASOS PCT: 0 %
Basophils Absolute: 0 10*3/uL (ref 0.0–0.1)
EOS ABS: 0 10*3/uL (ref 0.0–0.7)
EOS PCT: 1 %
HCT: 30.1 % — ABNORMAL LOW (ref 39.0–52.0)
HEMOGLOBIN: 9.9 g/dL — AB (ref 13.0–17.0)
Lymphocytes Relative: 21 %
Lymphs Abs: 0.4 10*3/uL — ABNORMAL LOW (ref 0.7–4.0)
MCH: 33.1 pg (ref 26.0–34.0)
MCHC: 32.9 g/dL (ref 30.0–36.0)
MCV: 100.7 fL — ABNORMAL HIGH (ref 78.0–100.0)
Monocytes Absolute: 0.1 10*3/uL (ref 0.1–1.0)
Monocytes Relative: 4 %
Neutro Abs: 1.4 10*3/uL — ABNORMAL LOW (ref 1.7–7.7)
Neutrophils Relative %: 74 %
PLATELETS: 71 10*3/uL — AB (ref 150–400)
RBC: 2.99 MIL/uL — ABNORMAL LOW (ref 4.22–5.81)
RDW: 18.3 % — AB (ref 11.5–15.5)
WBC: 1.9 10*3/uL — ABNORMAL LOW (ref 4.0–10.5)

## 2017-11-19 LAB — SAMPLE TO BLOOD BANK

## 2017-11-19 LAB — MAGNESIUM: MAGNESIUM: 2 mg/dL (ref 1.7–2.4)

## 2017-11-19 MED ORDER — HEPARIN SOD (PORK) LOCK FLUSH 100 UNIT/ML IV SOLN
500.0000 [IU] | Freq: Once | INTRAVENOUS | Status: AC
  Administered 2017-11-19: 500 [IU] via INTRAVENOUS

## 2017-11-19 MED ORDER — SODIUM CHLORIDE 0.9% FLUSH
10.0000 mL | INTRAVENOUS | Status: DC | PRN
  Administered 2017-11-19: 10 mL via INTRAVENOUS
  Filled 2017-11-19: qty 10

## 2017-11-19 NOTE — Progress Notes (Signed)
Omar Lee presented for labs and Portacath access and flush. Portacath located left chest wall accessed with  H 20 needle. Good blood return present. Portacath flushed with 41ml NS and 500U/6ml Heparin and needle removed intact. Procedure without incident. Patient tolerated procedure well.  Labs done today, labs meet perameters for no further treatment today. Vitals stable and discharged home from clinic ambulatory. Follow up as scheduled.

## 2017-11-21 ENCOUNTER — Other Ambulatory Visit (HOSPITAL_COMMUNITY): Payer: Self-pay | Admitting: *Deleted

## 2017-11-22 ENCOUNTER — Other Ambulatory Visit (HOSPITAL_COMMUNITY): Payer: Medicare HMO

## 2017-11-26 ENCOUNTER — Encounter (HOSPITAL_COMMUNITY): Payer: Self-pay

## 2017-11-26 ENCOUNTER — Inpatient Hospital Stay (HOSPITAL_COMMUNITY): Payer: Medicare HMO

## 2017-11-26 VITALS — BP 119/58 | HR 79 | Temp 98.1°F | Resp 18 | Wt 170.4 lb

## 2017-11-26 DIAGNOSIS — C9200 Acute myeloblastic leukemia, not having achieved remission: Secondary | ICD-10-CM

## 2017-11-26 LAB — MAGNESIUM: Magnesium: 1.8 mg/dL (ref 1.7–2.4)

## 2017-11-26 LAB — CBC WITH DIFFERENTIAL/PLATELET
BASOS ABS: 0 10*3/uL (ref 0.0–0.1)
BASOS PCT: 0 %
EOS PCT: 0 %
Eosinophils Absolute: 0 10*3/uL (ref 0.0–0.7)
HCT: 31.1 % — ABNORMAL LOW (ref 39.0–52.0)
Hemoglobin: 10.4 g/dL — ABNORMAL LOW (ref 13.0–17.0)
LYMPHS PCT: 31 %
Lymphs Abs: 0.4 10*3/uL — ABNORMAL LOW (ref 0.7–4.0)
MCH: 34.1 pg — ABNORMAL HIGH (ref 26.0–34.0)
MCHC: 33.4 g/dL (ref 30.0–36.0)
MCV: 102 fL — ABNORMAL HIGH (ref 78.0–100.0)
Monocytes Absolute: 0 10*3/uL — ABNORMAL LOW (ref 0.1–1.0)
Monocytes Relative: 2 %
Neutro Abs: 0.9 10*3/uL — ABNORMAL LOW (ref 1.7–7.7)
Neutrophils Relative %: 67 %
PLATELETS: 153 10*3/uL (ref 150–400)
RBC: 3.05 MIL/uL — AB (ref 4.22–5.81)
RDW: 19 % — ABNORMAL HIGH (ref 11.5–15.5)
WBC: 1.3 10*3/uL — AB (ref 4.0–10.5)

## 2017-11-26 LAB — COMPREHENSIVE METABOLIC PANEL
ALT: 63 U/L — ABNORMAL HIGH (ref 0–44)
AST: 34 U/L (ref 15–41)
Albumin: 3.9 g/dL (ref 3.5–5.0)
Alkaline Phosphatase: 76 U/L (ref 38–126)
Anion gap: 8 (ref 5–15)
BILIRUBIN TOTAL: 1.4 mg/dL — AB (ref 0.3–1.2)
BUN: 14 mg/dL (ref 8–23)
CALCIUM: 8.7 mg/dL — AB (ref 8.9–10.3)
CO2: 25 mmol/L (ref 22–32)
Chloride: 105 mmol/L (ref 98–111)
Creatinine, Ser: 0.93 mg/dL (ref 0.61–1.24)
GFR calc Af Amer: >60 mL/min (ref >60)
Glucose, Bld: 148 mg/dL — ABNORMAL HIGH (ref 70–99)
POTASSIUM: 3.3 mmol/L — AB (ref 3.5–5.1)
Sodium: 138 mmol/L (ref 135–145)
TOTAL PROTEIN: 6.5 g/dL (ref 6.5–8.1)

## 2017-11-26 MED ORDER — HEPARIN SOD (PORK) LOCK FLUSH 100 UNIT/ML IV SOLN
500.0000 [IU] | Freq: Once | INTRAVENOUS | Status: AC
  Administered 2017-11-26: 500 [IU] via INTRAVENOUS
  Filled 2017-11-26: qty 5

## 2017-11-26 MED ORDER — SODIUM CHLORIDE 0.9% FLUSH
10.0000 mL | Freq: Once | INTRAVENOUS | Status: AC
  Administered 2017-11-26: 10 mL

## 2017-11-26 NOTE — Progress Notes (Signed)
Omar Lee presents today for weekly labwork. Port accessed with positive blood return. Lab work drawn. Reviewed and faxed to [facility]. Patient to see Dr. Florene Glen tomorrow. Port flushed with Heparin and deaccessed. VSS. Pt discharged self ambulatory in satisfactory condition.

## 2017-11-26 NOTE — Patient Instructions (Addendum)
[place] at [facility]  Discharge Instructions:  Today you had lab work and your port was flushed. Follow up as scheduled. Call clinic for any questions or concerns. _______________________________________________________________  Thank you for choosing [place] at [facility] to provide your oncology and hematology care.  To afford each patient quality time with our providers, please arrive at least 15 minutes before your scheduled appointment.  You need to re-schedule your appointment if you arrive 10 or more minutes late.  We strive to give you quality time with our providers, and arriving late affects you and other patients whose appointments are after yours.  Also, if you no show three or more times for appointments you may be dismissed from the clinic.  Again, thank you for choosing [place] at [place] hope is that these requests will allow you access to exceptional care and in a timely manner. _______________________________________________________________  If you have questions after your visit, please contact our office at [phone] between the hours of 8:30 a.m. and 5:00 p.m. Voicemails left after 4:30 p.m. will not be returned until the following business day. _______________________________________________________________  For prescription refill requests, have your pharmacy contact our office. _______________________________________________________________  Recommendations made by the consultant and any test results will be sent to your referring physician. _______________________________________________________________

## 2017-11-26 NOTE — Progress Notes (Signed)
CRITICAL VALUE ALERT Critical value received:  WBC-1.3 Date of notification:  11/26/17 Time of notification: 9574 Critical value read back:  Yes.   Nurse who received alert:  M.Laynee Lockamy, LPn MD notified (1st page):  S.Katragadda, MD

## 2017-11-27 ENCOUNTER — Other Ambulatory Visit (HOSPITAL_COMMUNITY): Payer: Medicare HMO

## 2017-11-27 DIAGNOSIS — D709 Neutropenia, unspecified: Secondary | ICD-10-CM | POA: Diagnosis not present

## 2017-11-27 DIAGNOSIS — E039 Hypothyroidism, unspecified: Secondary | ICD-10-CM | POA: Diagnosis not present

## 2017-11-27 DIAGNOSIS — C9200 Acute myeloblastic leukemia, not having achieved remission: Secondary | ICD-10-CM | POA: Diagnosis not present

## 2017-11-27 DIAGNOSIS — R600 Localized edema: Secondary | ICD-10-CM | POA: Diagnosis not present

## 2017-11-27 DIAGNOSIS — R110 Nausea: Secondary | ICD-10-CM | POA: Diagnosis not present

## 2017-11-28 ENCOUNTER — Telehealth (HOSPITAL_COMMUNITY): Payer: Self-pay

## 2017-11-28 NOTE — Telephone Encounter (Signed)
Patients caregiver Otila Kluver) called to give an update on Omar Lee and PVV new treatment plan.  They are holding his Maynard for now and he will restart his chemo after labor day.  He is now on Fluconazole and Levaquin and starting Zyrtec for the fluid behind his ears.  [facility] will fax a new treatment plan to [place].

## 2017-11-29 ENCOUNTER — Other Ambulatory Visit (HOSPITAL_COMMUNITY): Payer: Medicare HMO

## 2017-12-03 ENCOUNTER — Encounter (HOSPITAL_COMMUNITY): Payer: Self-pay

## 2017-12-03 ENCOUNTER — Inpatient Hospital Stay (HOSPITAL_COMMUNITY): Payer: Medicare HMO

## 2017-12-03 ENCOUNTER — Other Ambulatory Visit: Payer: Self-pay

## 2017-12-03 ENCOUNTER — Ambulatory Visit (HOSPITAL_COMMUNITY): Payer: Medicare HMO

## 2017-12-03 ENCOUNTER — Other Ambulatory Visit (HOSPITAL_COMMUNITY): Payer: Medicare HMO

## 2017-12-03 ENCOUNTER — Ambulatory Visit (HOSPITAL_COMMUNITY): Payer: Medicare HMO | Admitting: Hematology

## 2017-12-03 DIAGNOSIS — C9200 Acute myeloblastic leukemia, not having achieved remission: Secondary | ICD-10-CM | POA: Diagnosis not present

## 2017-12-03 LAB — COMPREHENSIVE METABOLIC PANEL
ALT: 32 U/L (ref 0–44)
ANION GAP: 8 (ref 5–15)
AST: 23 U/L (ref 15–41)
Albumin: 3.9 g/dL (ref 3.5–5.0)
Alkaline Phosphatase: 65 U/L (ref 38–126)
BILIRUBIN TOTAL: 0.9 mg/dL (ref 0.3–1.2)
BUN: 16 mg/dL (ref 8–23)
CALCIUM: 8.7 mg/dL — AB (ref 8.9–10.3)
CO2: 27 mmol/L (ref 22–32)
Chloride: 105 mmol/L (ref 98–111)
Creatinine, Ser: 1.04 mg/dL (ref 0.61–1.24)
Glucose, Bld: 103 mg/dL — ABNORMAL HIGH (ref 70–99)
Potassium: 3.5 mmol/L (ref 3.5–5.1)
Sodium: 140 mmol/L (ref 135–145)
TOTAL PROTEIN: 6.5 g/dL (ref 6.5–8.1)

## 2017-12-03 LAB — CBC WITH DIFFERENTIAL/PLATELET
BASOS ABS: 0 10*3/uL (ref 0.0–0.1)
BASOS PCT: 1 %
Eosinophils Absolute: 0 10*3/uL (ref 0.0–0.7)
Eosinophils Relative: 0 %
HCT: 32.5 % — ABNORMAL LOW (ref 39.0–52.0)
HEMOGLOBIN: 10.5 g/dL — AB (ref 13.0–17.0)
Lymphocytes Relative: 58 %
Lymphs Abs: 0.4 10*3/uL (ref 0.7–4.0)
MCH: 33.9 pg (ref 26.0–34.0)
MCHC: 32.3 g/dL (ref 30.0–36.0)
MCV: 104.8 fL — ABNORMAL HIGH (ref 78.0–100.0)
MONO ABS: 0 10*3/uL (ref 0.1–1.0)
Monocytes Relative: 4 %
NEUTROS ABS: 0.3 10*3/uL (ref 1.7–7.7)
NEUTROS PCT: 37 %
Platelets: 266 10*3/uL (ref 150–400)
RBC: 3.1 MIL/uL — AB (ref 4.22–5.81)
RDW: 19 % — AB (ref 11.5–15.5)
WBC: 0.7 10*3/uL — AB (ref 4.0–10.5)

## 2017-12-03 LAB — MAGNESIUM: Magnesium: 2 mg/dL (ref 1.7–2.4)

## 2017-12-03 NOTE — Progress Notes (Signed)
CRITICAL VALUE ALERT  Critical Value:  WBC 700  Date & Time Notied:  1058  Provider Notified: per protocol, labs faxed to Dr. Florene Glen at [facility]   Orders Received/Actions taken: [place] presented for Portacath access and flush. Portacath located left chest wall accessed with  H 20 needle.  Good blood return present. Portacath flushed with 47ml NS and 500U/93ml Heparin and needle removed intact.  Procedure tolerated well and without incident.  Discharged ambulatory.

## 2017-12-04 ENCOUNTER — Ambulatory Visit (HOSPITAL_COMMUNITY): Payer: Medicare HMO

## 2017-12-05 ENCOUNTER — Ambulatory Visit (HOSPITAL_COMMUNITY): Payer: Medicare HMO

## 2017-12-06 ENCOUNTER — Other Ambulatory Visit (HOSPITAL_COMMUNITY): Payer: Medicare HMO

## 2017-12-06 ENCOUNTER — Ambulatory Visit (HOSPITAL_COMMUNITY): Payer: Medicare HMO

## 2017-12-07 ENCOUNTER — Ambulatory Visit (HOSPITAL_COMMUNITY): Payer: Medicare HMO

## 2017-12-10 ENCOUNTER — Other Ambulatory Visit: Payer: Self-pay

## 2017-12-10 ENCOUNTER — Inpatient Hospital Stay (HOSPITAL_COMMUNITY): Payer: Medicare HMO

## 2017-12-10 ENCOUNTER — Encounter (HOSPITAL_COMMUNITY): Payer: Self-pay

## 2017-12-10 DIAGNOSIS — C9200 Acute myeloblastic leukemia, not having achieved remission: Secondary | ICD-10-CM

## 2017-12-10 LAB — CBC WITH DIFFERENTIAL/PLATELET
Basophils Absolute: 0 10*3/uL (ref 0.0–0.1)
Basophils Relative: 1 %
EOS ABS: 0 10*3/uL (ref 0.0–0.7)
EOS PCT: 0 %
HCT: 34.8 % — ABNORMAL LOW (ref 39.0–52.0)
Hemoglobin: 11.4 g/dL — ABNORMAL LOW (ref 13.0–17.0)
LYMPHS PCT: 42 %
Lymphs Abs: 0.5 10*3/uL — ABNORMAL LOW (ref 0.7–4.0)
MCH: 34 pg (ref 26.0–34.0)
MCHC: 32.8 g/dL (ref 30.0–36.0)
MCV: 103.9 fL — AB (ref 78.0–100.0)
MONO ABS: 0.3 10*3/uL (ref 0.1–1.0)
Monocytes Relative: 24 %
Neutro Abs: 0.4 10*3/uL — ABNORMAL LOW (ref 1.7–7.7)
Neutrophils Relative %: 33 %
PLATELETS: 232 10*3/uL (ref 150–400)
RBC: 3.35 MIL/uL — AB (ref 4.22–5.81)
RDW: 18.1 % — AB (ref 11.5–15.5)
WBC: 1.3 10*3/uL — AB (ref 4.0–10.5)

## 2017-12-10 LAB — COMPREHENSIVE METABOLIC PANEL
ALK PHOS: 70 U/L (ref 38–126)
ALT: 28 U/L (ref 0–44)
AST: 26 U/L (ref 15–41)
Albumin: 4.2 g/dL (ref 3.5–5.0)
Anion gap: 9 (ref 5–15)
BILIRUBIN TOTAL: 0.9 mg/dL (ref 0.3–1.2)
BUN: 14 mg/dL (ref 8–23)
CO2: 26 mmol/L (ref 22–32)
CREATININE: 1.02 mg/dL (ref 0.61–1.24)
Calcium: 8.9 mg/dL (ref 8.9–10.3)
Chloride: 103 mmol/L (ref 98–111)
GFR calc Af Amer: >60 mL/min (ref >60)
GFR calc non Af Amer: >60 mL/min (ref >60)
GLUCOSE: 120 mg/dL — AB (ref 70–99)
Potassium: 3.8 mmol/L (ref 3.5–5.1)
Sodium: 138 mmol/L (ref 135–145)
TOTAL PROTEIN: 6.9 g/dL (ref 6.5–8.1)

## 2017-12-10 LAB — MAGNESIUM: Magnesium: 2 mg/dL (ref 1.7–2.4)

## 2017-12-10 MED ORDER — SODIUM CHLORIDE 0.9% FLUSH
10.0000 mL | INTRAVENOUS | Status: DC | PRN
  Administered 2017-12-10: 10 mL via INTRAVENOUS
  Filled 2017-12-10: qty 10

## 2017-12-10 MED ORDER — HEPARIN SOD (PORK) LOCK FLUSH 100 UNIT/ML IV SOLN
500.0000 [IU] | Freq: Once | INTRAVENOUS | Status: AC
  Administered 2017-12-10: 500 [IU] via INTRAVENOUS
  Filled 2017-12-10: qty 5

## 2017-12-10 MED ORDER — TRAMADOL HCL 50 MG PO TABS: 100.0000 mg | ORAL_TABLET | Freq: Two times a day (BID) | ORAL | 0 refills | Status: DC

## 2017-12-10 NOTE — Progress Notes (Signed)
Labs drawn today per orders. No additional treatment needed at this time.   Vitals stable and discharged home from clinic ambulatory. Follow up as scheduled.

## 2017-12-18 ENCOUNTER — Other Ambulatory Visit: Payer: Self-pay

## 2017-12-18 ENCOUNTER — Inpatient Hospital Stay (HOSPITAL_COMMUNITY): Payer: Medicare HMO

## 2017-12-18 ENCOUNTER — Ambulatory Visit (HOSPITAL_COMMUNITY): Payer: Medicare HMO | Admitting: Hematology

## 2017-12-18 ENCOUNTER — Other Ambulatory Visit (HOSPITAL_COMMUNITY): Payer: Medicare HMO

## 2017-12-18 ENCOUNTER — Ambulatory Visit (HOSPITAL_COMMUNITY): Payer: Medicare HMO

## 2017-12-18 ENCOUNTER — Inpatient Hospital Stay (HOSPITAL_COMMUNITY): Payer: Medicare HMO | Attending: Hematology | Admitting: Hematology

## 2017-12-18 ENCOUNTER — Encounter (HOSPITAL_COMMUNITY): Payer: Self-pay | Admitting: Hematology

## 2017-12-18 DIAGNOSIS — R531 Weakness: Secondary | ICD-10-CM | POA: Diagnosis not present

## 2017-12-18 DIAGNOSIS — M545 Low back pain: Secondary | ICD-10-CM | POA: Diagnosis not present

## 2017-12-18 DIAGNOSIS — R5383 Other fatigue: Secondary | ICD-10-CM | POA: Diagnosis not present

## 2017-12-18 DIAGNOSIS — C9200 Acute myeloblastic leukemia, not having achieved remission: Secondary | ICD-10-CM | POA: Insufficient documentation

## 2017-12-18 DIAGNOSIS — M25559 Pain in unspecified hip: Secondary | ICD-10-CM | POA: Diagnosis not present

## 2017-12-18 LAB — CBC WITH DIFFERENTIAL/PLATELET
BASOS PCT: 0 %
Basophils Absolute: 0 10*3/uL (ref 0.0–0.1)
Eosinophils Absolute: 0 10*3/uL (ref 0.0–0.7)
Eosinophils Relative: 1 %
HEMATOCRIT: 36 % — AB (ref 39.0–52.0)
Hemoglobin: 11.7 g/dL — ABNORMAL LOW (ref 13.0–17.0)
Lymphocytes Relative: 20 %
Lymphs Abs: 0.7 10*3/uL (ref 0.7–4.0)
MCH: 33.8 pg (ref 26.0–34.0)
MCHC: 32.5 g/dL (ref 30.0–36.0)
MCV: 104 fL — AB (ref 78.0–100.0)
MONO ABS: 0.5 10*3/uL (ref 0.1–1.0)
MONOS PCT: 13 %
NEUTROS ABS: 2.3 10*3/uL (ref 1.7–7.7)
Neutrophils Relative %: 66 %
Platelets: 196 10*3/uL (ref 150–400)
RBC: 3.46 MIL/uL — ABNORMAL LOW (ref 4.22–5.81)
RDW: 16.9 % — AB (ref 11.5–15.5)
WBC: 3.5 10*3/uL — ABNORMAL LOW (ref 4.0–10.5)

## 2017-12-18 LAB — COMPREHENSIVE METABOLIC PANEL
ALBUMIN: 4.1 g/dL (ref 3.5–5.0)
ALK PHOS: 78 U/L (ref 38–126)
ALT: 25 U/L (ref 0–44)
ANION GAP: 9 (ref 5–15)
AST: 24 U/L (ref 15–41)
BUN: 15 mg/dL (ref 8–23)
CALCIUM: 8.9 mg/dL (ref 8.9–10.3)
CO2: 25 mmol/L (ref 22–32)
CREATININE: 0.99 mg/dL (ref 0.61–1.24)
Chloride: 102 mmol/L (ref 98–111)
GFR calc Af Amer: >60 mL/min (ref >60)
GFR calc non Af Amer: >60 mL/min (ref >60)
GLUCOSE: 116 mg/dL — AB (ref 70–99)
Potassium: 3.7 mmol/L (ref 3.5–5.1)
SODIUM: 136 mmol/L (ref 135–145)
Total Bilirubin: 0.8 mg/dL (ref 0.3–1.2)
Total Protein: 6.8 g/dL (ref 6.5–8.1)

## 2017-12-18 LAB — MAGNESIUM: Magnesium: 1.9 mg/dL (ref 1.7–2.4)

## 2017-12-18 MED ORDER — SODIUM CHLORIDE 0.9% FLUSH
10.0000 mL | Freq: Once | INTRAVENOUS | Status: AC
  Administered 2017-12-18: 10 mL via INTRAVENOUS

## 2017-12-18 MED ORDER — HEPARIN SOD (PORK) LOCK FLUSH 100 UNIT/ML IV SOLN
500.0000 [IU] | Freq: Once | INTRAVENOUS | Status: AC
  Administered 2017-12-18: 500 [IU] via INTRAVENOUS

## 2017-12-18 NOTE — Progress Notes (Signed)
No treatment today, will start next Monday per MD.

## 2017-12-18 NOTE — Patient Instructions (Addendum)
[place] at [facility] Discharge Instructions   Start treatment back on Monday with labs. Follow up in 7 weeks with treatment and labs.   Thank you for choosing [place] at [facility] to provide your oncology and hematology care.  To afford each patient quality time with our provider, please arrive at least 15 minutes before your scheduled appointment time.   If you have a lab appointment with the [facility] please come in thru the  Main Entrance and check in at the main information desk  You need to re-schedule your appointment should you arrive 10 or more minutes late.  We strive to give you quality time with our providers, and arriving late affects you and other patients whose appointments are after yours.  Also, if you no show three or more times for appointments you may be dismissed from the clinic at the providers discretion.     Again, thank you for choosing [facility].  Our hope is that these requests will decrease the amount of time that you wait before being seen by our physicians.       _____________________________________________________________  Should you have questions after your visit to [facility], please contact our office at [phone] between the hours of 8:00 a.m. and 4:30 p.m.  Voicemails left after 4:00 p.m. will not be returned until the following business day.  For prescription refill requests, have your pharmacy contact our office and allow 72 hours.    Cancer Center Support Programs:   > Cancer Support Group  2nd Tuesday of the month 1pm-2pm, Journey Room

## 2017-12-18 NOTE — Assessment & Plan Note (Addendum)
1.  AML with myelodysplasia related changes:  - Cycle 1 of decitabine 20 mg/m2 IV x5 days, along with venetoclax 400 mg daily started on 07/13/2017 - bone marrow on 08/07/2017 shows decrease in bone marrow blasts to around 36%.  Initial biopsy showed 86% blasts. - Cycle 2 decitabine on 08/12/2017, requiring weekly transfusions.  He had bone marrow biopsy done on 09/04/2017 which shows hypocellular with no excess blasts. - Cycle 3 of decitabine (20 mg per M square) every 6 weeks was given on 09/24/2017. -Cycle 4 of decitabine 20 mg/m for 5 days was given on 11/05/2017.  His venetoclax was taken on Tuesday 821.  He did have some diarrhea with it.  He also felt very weak.  Notes from [facility] mention that he has fallen.  Patient does not recollect any fall. -I have reviewed his CBC which shows recovered blood counts.  He has a follow-up appointment with Dr. Florene Glen this Friday.  He will likely start his cycle 5 next week.  Cycle 5 will be done without venetoclax to see if it is causing diarrhea.  2.  Access: Port was placed on the left chest wall on 11/02/2017.  3.  ID: Acyclovir, Diflucan and Levaquin are on hold at this time.  4.  Low back pain: Patient is taking tramadol 50 mg 2 tablets twice daily which is helping it.

## 2017-12-18 NOTE — Progress Notes (Signed)
[place] [place], [place] [address]   CLINIC:  Medical Oncology/Hematology  PCP:  [name], MD [place] [address] [phone]   REASON FOR VISIT:  Follow-up for Acute Myeloblastic leukemia   CURRENT THERAPY: Decitabine and Venetoclax  BRIEF ONCOLOGIC HISTORY:    AML (acute myeloblastic leukemia) ([facility])   07/2016 Initial Diagnosis    MDS- BM BX with 8% blasts.  Cytogenetics with minus Y only.  IPSS-R 3.5, intermediate risk (0 for cyto, 2.0 for blasts, 1.5 for Hb, 0 for platelets, 0 for ANC).    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  FLT3-ITD/TKD negative.  AML panel positive for NRAS,STAG2,TET2, U2AF1, DNMT3A    07/13/2017 -  Chemotherapy    C1 decitabine 20 mg/m2 IV daily x5 days with venetoclax 400 mg daily.  Given Hydrea prior to starting when it collects due to leukocytosis.       INTERVAL HISTORY:  Mr. Omar Lee 77 y.o. male returns for routine follow-up for Acute myeloblastic leukemia. Patient is here today and complaining of pain in his hip. He is taking tramadol for his pain and this is helping. Patient is very hard of hearing and is a poor historian. He isn't sure of the medications he is taking at this time. Patient does have fatigue and weakness throughout the day. He reports his appetite and his energy level at at 25%. He is maintaining his weight at this time. He denies any new pains. Denies any nausea, vomiting, or diarrhea. Denies any skin rashes. Denies any fevers or recent infections.      REVIEW OF SYSTEMS:  Review of Systems  Constitutional: Positive for fatigue.  Musculoskeletal: Positive for back pain.  Neurological:  Positive for dizziness and extremity weakness.  Hematological: Bruises/bleeds easily.  All other systems reviewed and are negative.    PAST MEDICAL/SURGICAL HISTORY:  Past Medical History:  Diagnosis Date  . Cancer ([facility])    MDS  . Hyperlipidemia   . Hypothyroidism   . Reflux   . Sleep apnea    Past Surgical History:  Procedure Laterality Date  . ESOPHAGOGASTRODUODENOSCOPY (EGD) WITH ESOPHAGEAL DILATION N/A 11/22/2012   Procedure: ESOPHAGOGASTRODUODENOSCOPY (EGD) WITH ESOPHAGEAL DILATION;  Surgeon: Rogene Houston, MD;  Location: [facility] SUITE;  Service: Endoscopy;  Laterality: N/A;  1120  . HEMORRHOID SURGERY    . NASAL SEPTUM SURGERY    . PORTACATH PLACEMENT Left 11/02/2017   Procedure: INSERTION PORT WITH ATTACHED CATHETER LEFT SUBCLAVIAN;  Surgeon: Aviva Signs, MD;  Location: [facility] ORS;  Service: General;  Laterality: Left;  . SHOULDER SURGERY Right   . TONSILLECTOMY AND ADENOIDECTOMY       SOCIAL HISTORY:  Social History   Socioeconomic History  . Marital status: Divorced    Spouse name: Not on file  . Number of children: Not on file  . Years of education: Not on file  . Highest education level: Not on file  Occupational History  . Not on file  Social Needs  . Financial resource strain: Not on file  . Food insecurity:    Worry: Not on file    Inability: Not on file  . Transportation needs:  Medical: Not on file    Non-medical: Not on file  Tobacco Use  . Smoking status: Never Smoker  . Smokeless tobacco: Never Used  Substance and Sexual Activity  . Alcohol use: No  . Drug use: No  . Sexual activity: Not Currently    Birth control/protection: None  Lifestyle  . Physical activity:    Days per week: Not on file    Minutes per session: Not on file  . Stress: Not on file  Relationships  . Social connections:    Talks on phone: Not on file    Gets together: Not on file    Attends religious service: Not on file    Active member of club or organization: Not  on file    Attends meetings of clubs or organizations: Not on file    Relationship status: Not on file  . Intimate partner violence:    Fear of current or ex partner: Not on file    Emotionally abused: Not on file    Physically abused: Not on file    Forced sexual activity: Not on file  Other Topics Concern  . Not on file  Social History Narrative  . Not on file    FAMILY HISTORY:  Family History  Problem Relation Age of Onset  . Hypertension Mother   . Heart attack Mother   . Heart attack Father   . Diabetes Brother     CURRENT MEDICATIONS:  Outpatient Encounter Medications as of 12/18/2017  Medication Sig Note  . allopurinol (ZYLOPRIM) 300 MG tablet Take 1 tablet (300 mg total) by mouth daily.   Marland Kitchen levothyroxine (SYNTHROID, LEVOTHROID) 125 MCG tablet TAKE 1 TABLET DAILY (NEED OFFICE VISIT)   . pantoprazole (PROTONIX) 40 MG tablet    . traMADol (ULTRAM) 50 MG tablet Take 2 tablets (100 mg total) by mouth 2 (two) times daily.   . VENCLEXTA 100 MG TABS  09/11/2017: Per Dr. Florene Glen stop 09/09/17 until further notice  . acetaminophen (TYLENOL) 325 MG tablet Take 650 mg by mouth as needed.   . [DISCONTINUED] fluconazole (DIFLUCAN) 200 MG tablet     Facility-Administered Encounter Medications as of 12/18/2017  Medication  . 0.9 %  sodium chloride infusion  . acetaminophen (TYLENOL) tablet 650 mg  . diphenhydrAMINE (BENADRYL) capsule 25 mg  . heparin lock flush 100 unit/mL  . sodium chloride flush (NS) 0.9 % injection 10 mL  . sodium chloride flush (NS) 0.9 % injection 10 mL  . sodium chloride flush (NS) 0.9 % injection 10 mL  . sodium chloride flush (NS) 0.9 % injection 10 mL    ALLERGIES:  No Known Allergies   PHYSICAL EXAM:  ECOG Performance status: 1  Vitals:   12/18/17 1040  BP: 135/71  Pulse: 70  Resp: 18  Temp: 98.7 F (37.1 C)  SpO2: 100%   Filed Weights   12/18/17 1040  Weight: 173 lb 1.6 oz (78.5 kg)    Physical Exam  Constitutional: He is oriented to  person, place, and time. He appears well-developed and well-nourished.  Cardiovascular: Normal rate, regular rhythm and normal heart sounds.  Pulmonary/Chest: Effort normal and breath sounds normal.  Neurological: He is alert and oriented to person, place, and time.  Skin: Skin is warm and dry.  Psychiatric: He has a normal mood and affect. His behavior is normal. Judgment and thought content normal.  Abdomen: Soft nontender with no palpable organomegaly. Extremities: No edema cyanosis.   LABORATORY DATA:  I have reviewed the  labs as listed.  CBC    Component Value Date/Time   WBC 3.5 (L) 12/18/2017 1050   RBC 3.46 (L) 12/18/2017 1050   HGB 11.7 (L) 12/18/2017 1050   HGB 6.7 (LL) 06/23/2016 0825   HCT 36.0 (L) 12/18/2017 1050   HCT 20.1 (L) 06/23/2016 0825   PLT 196 12/18/2017 1050   PLT 182 06/23/2016 0825   MCV 104.0 (H) 12/18/2017 1050   MCV 106 (H) 06/23/2016 0825   MCH 33.8 12/18/2017 1050   MCHC 32.5 12/18/2017 1050   RDW 16.9 (H) 12/18/2017 1050   RDW 16.4 (H) 06/23/2016 0825   LYMPHSABS 0.7 12/18/2017 1050   LYMPHSABS 0.9 06/23/2016 0825   MONOABS 0.5 12/18/2017 1050   EOSABS 0.0 12/18/2017 1050   EOSABS 0.0 06/23/2016 0825   BASOSABS 0.0 12/18/2017 1050   BASOSABS 0.0 06/23/2016 0825   CMP Latest Ref Rng & Units 12/18/2017 12/10/2017 12/03/2017  Glucose 70 - 99 mg/dL 116(H) 120(H) 103(H)  BUN 8 - 23 mg/dL 15 14 16   Creatinine 0.61 - 1.24 mg/dL 0.99 1.02 1.04  Sodium 135 - 145 mmol/L 136 138 140  Potassium 3.5 - 5.1 mmol/L 3.7 3.8 3.5  Chloride 98 - 111 mmol/L 102 103 105  CO2 22 - 32 mmol/L 25 26 27   Calcium 8.9 - 10.3 mg/dL 8.9 8.9 8.7(L)  Total Protein 6.5 - 8.1 g/dL 6.8 6.9 6.5  Total Bilirubin 0.3 - 1.2 mg/dL 0.8 0.9 0.9  Alkaline Phos 38 - 126 U/L 78 70 65  AST 15 - 41 U/L 24 26 23   ALT 0 - 44 U/L 25 28 32        ASSESSMENT & PLAN:   AML (acute myeloblastic leukemia) ([facility]) 1.  AML with myelodysplasia related changes:  - Cycle 1 of decitabine 20  mg/m2 IV x5 days, along with venetoclax 400 mg daily started on 07/13/2017 - bone marrow on 08/07/2017 shows decrease in bone marrow blasts to around 36%.  Initial biopsy showed 86% blasts. - Cycle 2 decitabine on 08/12/2017, requiring weekly transfusions.  He had bone marrow biopsy done on 09/04/2017 which shows hypocellular with no excess blasts. - Cycle 3 of decitabine (20 mg per M square) every 6 weeks was given on 09/24/2017. -Cycle 4 of decitabine 20 mg/m for 5 days was given on 11/05/2017.  His venetoclax was taken on Tuesday 821.  He did have some diarrhea with it.  He also felt very weak.  Notes from [facility] mention that he has fallen.  Patient does not recollect any fall. -I have reviewed his CBC which shows recovered blood counts.  He has a follow-up appointment with Dr. Florene Glen this Friday.  He will likely start his cycle 5 next week.  Cycle 5 will be done without venetoclax to see if it is causing diarrhea.  2.  Access: Port was placed on the left chest wall on 11/02/2017.  3.  ID: Acyclovir, Diflucan and Levaquin are on hold at this time.  4.  Low back pain: Patient is taking tramadol 50 mg 2 tablets twice daily which is helping it.      Orders placed this encounter:  Orders Placed This Encounter  Procedures  . Magnesium  . CBC with Differential/Platelet  . Comprehensive metabolic panel  . Magnesium  . CBC with Differential/Platelet  . Comprehensive metabolic panel      Derek Jack, MD [place] [phone]

## 2017-12-19 ENCOUNTER — Ambulatory Visit (HOSPITAL_COMMUNITY): Payer: Medicare HMO

## 2017-12-20 ENCOUNTER — Ambulatory Visit (HOSPITAL_COMMUNITY): Payer: Medicare HMO

## 2017-12-21 ENCOUNTER — Ambulatory Visit (HOSPITAL_COMMUNITY): Payer: Medicare HMO

## 2017-12-21 ENCOUNTER — Encounter (HOSPITAL_COMMUNITY): Payer: Medicare HMO

## 2017-12-21 DIAGNOSIS — R609 Edema, unspecified: Secondary | ICD-10-CM | POA: Diagnosis not present

## 2017-12-21 DIAGNOSIS — M791 Myalgia, unspecified site: Secondary | ICD-10-CM | POA: Diagnosis not present

## 2017-12-21 DIAGNOSIS — R197 Diarrhea, unspecified: Secondary | ICD-10-CM | POA: Diagnosis not present

## 2017-12-21 DIAGNOSIS — C9200 Acute myeloblastic leukemia, not having achieved remission: Secondary | ICD-10-CM | POA: Diagnosis not present

## 2017-12-21 DIAGNOSIS — E039 Hypothyroidism, unspecified: Secondary | ICD-10-CM | POA: Diagnosis not present

## 2017-12-21 DIAGNOSIS — R600 Localized edema: Secondary | ICD-10-CM | POA: Diagnosis not present

## 2017-12-21 DIAGNOSIS — E785 Hyperlipidemia, unspecified: Secondary | ICD-10-CM | POA: Diagnosis not present

## 2017-12-21 DIAGNOSIS — Z959 Presence of cardiac and vascular implant and graft, unspecified: Secondary | ICD-10-CM | POA: Diagnosis not present

## 2017-12-21 DIAGNOSIS — R350 Frequency of micturition: Secondary | ICD-10-CM | POA: Diagnosis not present

## 2017-12-21 DIAGNOSIS — R634 Abnormal weight loss: Secondary | ICD-10-CM | POA: Diagnosis not present

## 2017-12-21 DIAGNOSIS — R52 Pain, unspecified: Secondary | ICD-10-CM | POA: Diagnosis not present

## 2017-12-21 DIAGNOSIS — R112 Nausea with vomiting, unspecified: Secondary | ICD-10-CM | POA: Diagnosis not present

## 2017-12-21 DIAGNOSIS — R630 Anorexia: Secondary | ICD-10-CM | POA: Diagnosis not present

## 2017-12-24 ENCOUNTER — Other Ambulatory Visit (HOSPITAL_COMMUNITY): Payer: Medicare HMO

## 2017-12-24 ENCOUNTER — Ambulatory Visit (HOSPITAL_COMMUNITY): Payer: Medicare HMO

## 2017-12-25 ENCOUNTER — Ambulatory Visit (HOSPITAL_COMMUNITY): Payer: Medicare HMO

## 2017-12-25 ENCOUNTER — Other Ambulatory Visit (HOSPITAL_COMMUNITY): Payer: Self-pay | Admitting: *Deleted

## 2017-12-25 NOTE — Progress Notes (Signed)
Patient's mar reviewed and cleared of medications no longer needed or PRN doses from previous treatment dates.

## 2017-12-26 ENCOUNTER — Ambulatory Visit (HOSPITAL_COMMUNITY): Payer: Medicare HMO

## 2017-12-27 ENCOUNTER — Ambulatory Visit (HOSPITAL_COMMUNITY): Payer: Medicare HMO

## 2017-12-28 ENCOUNTER — Encounter (HOSPITAL_COMMUNITY): Payer: Medicare HMO

## 2017-12-28 ENCOUNTER — Ambulatory Visit (HOSPITAL_COMMUNITY): Payer: Medicare HMO

## 2017-12-31 ENCOUNTER — Encounter (HOSPITAL_COMMUNITY): Payer: Self-pay

## 2017-12-31 ENCOUNTER — Inpatient Hospital Stay (HOSPITAL_COMMUNITY): Payer: Medicare HMO

## 2017-12-31 ENCOUNTER — Other Ambulatory Visit (HOSPITAL_COMMUNITY): Payer: Medicare HMO

## 2017-12-31 ENCOUNTER — Inpatient Hospital Stay (HOSPITAL_COMMUNITY): Payer: Medicare HMO | Attending: Internal Medicine

## 2017-12-31 VITALS — BP 147/53 | HR 66 | Temp 98.0°F | Resp 18 | Wt 172.2 lb

## 2017-12-31 DIAGNOSIS — Z5111 Encounter for antineoplastic chemotherapy: Secondary | ICD-10-CM | POA: Insufficient documentation

## 2017-12-31 DIAGNOSIS — R531 Weakness: Secondary | ICD-10-CM | POA: Diagnosis not present

## 2017-12-31 DIAGNOSIS — M545 Low back pain: Secondary | ICD-10-CM | POA: Diagnosis not present

## 2017-12-31 DIAGNOSIS — M25559 Pain in unspecified hip: Secondary | ICD-10-CM | POA: Diagnosis not present

## 2017-12-31 DIAGNOSIS — R5383 Other fatigue: Secondary | ICD-10-CM | POA: Diagnosis not present

## 2017-12-31 DIAGNOSIS — C9200 Acute myeloblastic leukemia, not having achieved remission: Secondary | ICD-10-CM | POA: Diagnosis present

## 2017-12-31 LAB — COMPREHENSIVE METABOLIC PANEL
ALBUMIN: 4.1 g/dL (ref 3.5–5.0)
ALK PHOS: 63 U/L (ref 38–126)
ALT: 25 U/L (ref 0–44)
AST: 22 U/L (ref 15–41)
Anion gap: 7 (ref 5–15)
BILIRUBIN TOTAL: 0.8 mg/dL (ref 0.3–1.2)
BUN: 13 mg/dL (ref 8–23)
CO2: 28 mmol/L (ref 22–32)
Calcium: 9.2 mg/dL (ref 8.9–10.3)
Chloride: 104 mmol/L (ref 98–111)
Creatinine, Ser: 0.91 mg/dL (ref 0.61–1.24)
GFR calc Af Amer: >60 mL/min (ref >60)
GFR calc non Af Amer: >60 mL/min (ref >60)
GLUCOSE: 108 mg/dL — AB (ref 70–99)
POTASSIUM: 3.6 mmol/L (ref 3.5–5.1)
Sodium: 139 mmol/L (ref 135–145)
TOTAL PROTEIN: 6.6 g/dL (ref 6.5–8.1)

## 2017-12-31 LAB — CBC WITH DIFFERENTIAL/PLATELET
BASOS ABS: 0 10*3/uL (ref 0.0–0.1)
BASOS PCT: 0 %
Eosinophils Absolute: 0.3 10*3/uL (ref 0.0–0.7)
Eosinophils Relative: 7 %
HEMATOCRIT: 38.4 % — AB (ref 39.0–52.0)
HEMOGLOBIN: 12.7 g/dL — AB (ref 13.0–17.0)
Lymphocytes Relative: 17 %
Lymphs Abs: 0.8 10*3/uL (ref 0.7–4.0)
MCH: 34 pg (ref 26.0–34.0)
MCHC: 33.1 g/dL (ref 30.0–36.0)
MCV: 102.9 fL — ABNORMAL HIGH (ref 78.0–100.0)
Monocytes Absolute: 0.3 10*3/uL (ref 0.1–1.0)
Monocytes Relative: 7 %
NEUTROS ABS: 3 10*3/uL (ref 1.7–7.7)
NEUTROS PCT: 69 %
Platelets: 149 10*3/uL — ABNORMAL LOW (ref 150–400)
RBC: 3.73 MIL/uL — ABNORMAL LOW (ref 4.22–5.81)
RDW: 15.8 % — ABNORMAL HIGH (ref 11.5–15.5)
WBC: 4.4 10*3/uL (ref 4.0–10.5)

## 2017-12-31 LAB — MAGNESIUM: MAGNESIUM: 1.9 mg/dL (ref 1.7–2.4)

## 2017-12-31 MED ORDER — SODIUM CHLORIDE 0.9 % IV SOLN
Freq: Once | INTRAVENOUS | Status: AC
  Administered 2017-12-31: 10:00:00 via INTRAVENOUS

## 2017-12-31 MED ORDER — PROCHLORPERAZINE MALEATE 10 MG PO TABS
10.0000 mg | ORAL_TABLET | Freq: Once | ORAL | Status: AC
  Administered 2017-12-31: 10 mg via ORAL
  Filled 2017-12-31: qty 1

## 2017-12-31 MED ORDER — SODIUM CHLORIDE 0.9% FLUSH
10.0000 mL | INTRAVENOUS | Status: DC | PRN
  Administered 2017-12-31: 10 mL
  Filled 2017-12-31: qty 10

## 2017-12-31 MED ORDER — SODIUM CHLORIDE 0.9 % IV SOLN
20.0000 mg/m2 | Freq: Once | INTRAVENOUS | Status: AC
  Administered 2017-12-31: 40 mg via INTRAVENOUS
  Filled 2017-12-31: qty 8

## 2017-12-31 NOTE — Progress Notes (Signed)
Omar Lee tolerated Decitabine infusion well without complaints or incident. Labs reviewed prior to administering this medication and then faxed to [facility] as directed. Pt reminded to continue to hold his Venetoclax with this cycle of chemo according to orders sent from Dr. Abel Presto office at [facility] and pt verbalized understanding. VSS upon discharge. Port left accessed and saline locked for use tomorrow. Pt discharged self ambulatory in satisfactory condition

## 2017-12-31 NOTE — Patient Instructions (Signed)
[facility] Discharge Instructions for Patients Receiving Chemotherapy   Beginning January 23rd 2017 lab work for the Cancer Center will be done in the  Main lab at [place] on 1st floor. If you have a lab appointment with the Cancer Center please come in thru the  Main Entrance and check in at the main information desk   Today you received the following chemotherapy agents Decitabine. Follow-up as scheduled. Call clinic for any questions or concerns  To help prevent nausea and vomiting after your treatment, we encourage you to take your nausea medication   If you develop nausea and vomiting, or diarrhea that is not controlled by your medication, call the clinic.  The clinic phone number is [phone]. Office hours are Monday-Friday 8:30am-5:00pm.  BELOW ARE SYMPTOMS THAT SHOULD BE REPORTED IMMEDIATELY:  *FEVER GREATER THAN 101.0 F  *CHILLS WITH OR WITHOUT FEVER  NAUSEA AND VOMITING THAT IS NOT CONTROLLED WITH YOUR NAUSEA MEDICATION  *UNUSUAL SHORTNESS OF BREATH  *UNUSUAL BRUISING OR BLEEDING  TENDERNESS IN MOUTH AND THROAT WITH OR WITHOUT PRESENCE OF ULCERS  *URINARY PROBLEMS  *BOWEL PROBLEMS  UNUSUAL RASH Items with * indicate a potential emergency and should be followed up as soon as possible. If you have an emergency after office hours please contact your primary care physician or go to the nearest emergency department.  Please call the clinic during office hours if you have any questions or concerns.   You may also contact the Patient Navigator at [phone] should you have any questions or need assistance in obtaining follow up care.      Resources For Cancer Patients and their Caregivers ? American Cancer Society: Can assist with transportation, wigs, general needs, runs Look Good Feel Better.        1-888-227-6333 ? Cancer Care: Provides financial assistance, online support groups, medication/co-pay assistance.  1-800-813-HOPE  (4673) ? [facility] Assists [place] cancer patients and their families through emotional , educational and financial support.  336-427-4357 ? [place] DSS Where to apply for food stamps, Medicaid and utility assistance. 336-342-1394 ? RCATS: Transportation to medical appointments. 336-347-2287 ? Social Security Administration: May apply for disability if have a Stage IV cancer. 336-342-7796 1-800-772-1213 ? [place] Aging, Disability and Transit Services: Assists with nutrition, care and transit needs. [phone]         

## 2018-01-01 ENCOUNTER — Inpatient Hospital Stay (HOSPITAL_COMMUNITY): Payer: Medicare HMO

## 2018-01-01 VITALS — BP 133/69 | HR 64 | Temp 97.9°F | Resp 16

## 2018-01-01 DIAGNOSIS — R5383 Other fatigue: Secondary | ICD-10-CM | POA: Diagnosis not present

## 2018-01-01 DIAGNOSIS — R531 Weakness: Secondary | ICD-10-CM | POA: Diagnosis not present

## 2018-01-01 DIAGNOSIS — M25559 Pain in unspecified hip: Secondary | ICD-10-CM | POA: Diagnosis not present

## 2018-01-01 DIAGNOSIS — C9200 Acute myeloblastic leukemia, not having achieved remission: Secondary | ICD-10-CM | POA: Diagnosis not present

## 2018-01-01 DIAGNOSIS — M545 Low back pain: Secondary | ICD-10-CM | POA: Diagnosis not present

## 2018-01-01 MED ORDER — PROCHLORPERAZINE MALEATE 10 MG PO TABS
10.0000 mg | ORAL_TABLET | Freq: Once | ORAL | Status: AC
  Administered 2018-01-01: 10 mg via ORAL

## 2018-01-01 MED ORDER — SODIUM CHLORIDE 0.9% FLUSH
10.0000 mL | INTRAVENOUS | Status: DC | PRN
  Administered 2018-01-01: 10 mL
  Filled 2018-01-01: qty 10

## 2018-01-01 MED ORDER — SODIUM CHLORIDE 0.9 % IV SOLN
Freq: Once | INTRAVENOUS | Status: AC
  Administered 2018-01-01: 14:00:00 via INTRAVENOUS

## 2018-01-01 MED ORDER — SODIUM CHLORIDE 0.9 % IV SOLN
20.0000 mg/m2 | Freq: Once | INTRAVENOUS | Status: AC
  Administered 2018-01-01: 40 mg via INTRAVENOUS
  Filled 2018-01-01: qty 8

## 2018-01-01 MED ORDER — HEPARIN SOD (PORK) LOCK FLUSH 100 UNIT/ML IV SOLN
500.0000 [IU] | Freq: Once | INTRAVENOUS | Status: AC | PRN
  Administered 2018-01-01: 500 [IU]

## 2018-01-01 MED ORDER — PROCHLORPERAZINE MALEATE 10 MG PO TABS
ORAL_TABLET | ORAL | Status: AC
  Filled 2018-01-01: qty 1

## 2018-01-01 NOTE — Progress Notes (Signed)
Darcel Smalling tolerated Decitabine infusion without incident or complaint. VSS upon completion of treatment. Deaccessed per pt request. Pt reminded of treatment time for tomorrow. Discharged self ambulatory in satisfactory condition.

## 2018-01-01 NOTE — Patient Instructions (Signed)
[facility] Discharge Instructions for Patients Receiving Chemotherapy   Beginning January 23rd 2017 lab work for the [facility] will be done in the  Main lab at [facility] on 1st floor. If you have a lab appointment with the [place] please come in thru the  Main Entrance and check in at the main information desk   Today you received the following chemotherapy agents Decitabine. Follow up as scheduled. Call clinic for any questions or concerns.   If you develop nausea and vomiting, or diarrhea that is not controlled by your medication, call the clinic.  The clinic phone number is [phone]. Office hours are Monday-Friday 8:30am-5:00pm.  BELOW ARE SYMPTOMS THAT SHOULD BE REPORTED IMMEDIATELY:  *FEVER GREATER THAN 101.0 F  *CHILLS WITH OR WITHOUT FEVER  NAUSEA AND VOMITING THAT IS NOT CONTROLLED WITH YOUR NAUSEA MEDICATION  *UNUSUAL SHORTNESS OF BREATH  *UNUSUAL BRUISING OR BLEEDING  TENDERNESS IN MOUTH AND THROAT WITH OR WITHOUT PRESENCE OF ULCERS  *URINARY PROBLEMS  *BOWEL PROBLEMS  UNUSUAL RASH Items with * indicate a potential emergency and should be followed up as soon as possible. If you have an emergency after office hours please contact your primary care physician or go to the nearest emergency department.  Please call the clinic during office hours if you have any questions or concerns.   You may also contact the Patient Navigator at [phone] should you have any questions or need assistance in obtaining follow up care.      Resources For Cancer Patients and their Caregivers ? American Cancer Society: Can assist with transportation, wigs, general needs, runs Look Good Feel Better.        919 039 7904 ? Cancer Care: Provides financial assistance, online support groups, medication/co-pay assistance.  1-800-813-HOPE 562 359 6247) ? [facility] Assists [place] cancer patients and their families through  emotional , educational and financial support.  980-345-5721 ? [place] DSS Where to apply for food stamps, Medicaid and utility assistance. 410-843-2310 ? RCATS: Transportation to medical appointments. 9360661422 ? Social Security Administration: May apply for disability if have a Stage IV cancer. 706-071-5877 912-820-0703 ? [employer], Disability and Transit Services: Assists with nutrition, care and transit needs. [phone]

## 2018-01-02 ENCOUNTER — Encounter (HOSPITAL_COMMUNITY): Payer: Self-pay

## 2018-01-02 ENCOUNTER — Inpatient Hospital Stay (HOSPITAL_COMMUNITY): Payer: Medicare HMO

## 2018-01-02 VITALS — BP 96/67 | HR 69 | Temp 97.8°F | Resp 18

## 2018-01-02 DIAGNOSIS — C9200 Acute myeloblastic leukemia, not having achieved remission: Secondary | ICD-10-CM

## 2018-01-02 DIAGNOSIS — M25559 Pain in unspecified hip: Secondary | ICD-10-CM | POA: Diagnosis not present

## 2018-01-02 DIAGNOSIS — R531 Weakness: Secondary | ICD-10-CM | POA: Diagnosis not present

## 2018-01-02 DIAGNOSIS — M545 Low back pain: Secondary | ICD-10-CM | POA: Diagnosis not present

## 2018-01-02 DIAGNOSIS — R5383 Other fatigue: Secondary | ICD-10-CM | POA: Diagnosis not present

## 2018-01-02 MED ORDER — SODIUM CHLORIDE 0.9 % IV SOLN
Freq: Once | INTRAVENOUS | Status: AC
  Administered 2018-01-02: 13:00:00 via INTRAVENOUS

## 2018-01-02 MED ORDER — SODIUM CHLORIDE 0.9% FLUSH
10.0000 mL | INTRAVENOUS | Status: DC | PRN
  Administered 2018-01-02: 10 mL
  Filled 2018-01-02: qty 10

## 2018-01-02 MED ORDER — SODIUM CHLORIDE 0.9 % IV SOLN
20.0000 mg/m2 | Freq: Once | INTRAVENOUS | Status: AC
  Administered 2018-01-02: 40 mg via INTRAVENOUS
  Filled 2018-01-02: qty 8

## 2018-01-02 MED ORDER — PROCHLORPERAZINE MALEATE 10 MG PO TABS
ORAL_TABLET | ORAL | Status: AC
  Filled 2018-01-02: qty 1

## 2018-01-02 MED ORDER — HEPARIN SOD (PORK) LOCK FLUSH 100 UNIT/ML IV SOLN: 500.0000 [IU] | Freq: Once | INTRAVENOUS | Status: DC | PRN

## 2018-01-02 MED ORDER — PROCHLORPERAZINE MALEATE 10 MG PO TABS
10.0000 mg | ORAL_TABLET | Freq: Once | ORAL | Status: AC
  Administered 2018-01-02: 10 mg via ORAL

## 2018-01-02 NOTE — Patient Instructions (Signed)
[facility] Discharge Instructions for Patients Receiving Chemotherapy   Beginning January 23rd 2017 lab work for the Cancer Center will be done in the  Main lab at [place] on 1st floor. If you have a lab appointment with the Cancer Center please come in thru the  Main Entrance and check in at the main information desk   Today you received the following chemotherapy agents Decitabine. Follow-up as scheduled. Call clinic for any questions or concerns  To help prevent nausea and vomiting after your treatment, we encourage you to take your nausea medication   If you develop nausea and vomiting, or diarrhea that is not controlled by your medication, call the clinic.  The clinic phone number is [phone]. Office hours are Monday-Friday 8:30am-5:00pm.  BELOW ARE SYMPTOMS THAT SHOULD BE REPORTED IMMEDIATELY:  *FEVER GREATER THAN 101.0 F  *CHILLS WITH OR WITHOUT FEVER  NAUSEA AND VOMITING THAT IS NOT CONTROLLED WITH YOUR NAUSEA MEDICATION  *UNUSUAL SHORTNESS OF BREATH  *UNUSUAL BRUISING OR BLEEDING  TENDERNESS IN MOUTH AND THROAT WITH OR WITHOUT PRESENCE OF ULCERS  *URINARY PROBLEMS  *BOWEL PROBLEMS  UNUSUAL RASH Items with * indicate a potential emergency and should be followed up as soon as possible. If you have an emergency after office hours please contact your primary care physician or go to the nearest emergency department.  Please call the clinic during office hours if you have any questions or concerns.   You may also contact the Patient Navigator at [phone] should you have any questions or need assistance in obtaining follow up care.      Resources For Cancer Patients and their Caregivers ? American Cancer Society: Can assist with transportation, wigs, general needs, runs Look Good Feel Better.        1-888-227-6333 ? Cancer Care: Provides financial assistance, online support groups, medication/co-pay assistance.  1-800-813-HOPE  (4673) ? [facility] Assists [place] cancer patients and their families through emotional , educational and financial support.  336-427-4357 ? [place] DSS Where to apply for food stamps, Medicaid and utility assistance. 336-342-1394 ? RCATS: Transportation to medical appointments. 336-347-2287 ? Social Security Administration: May apply for disability if have a Stage IV cancer. 336-342-7796 1-800-772-1213 ? [place] Aging, Disability and Transit Services: Assists with nutrition, care and transit needs. [phone]         

## 2018-01-02 NOTE — Progress Notes (Signed)
Omar Lee tolerated Decitabine infusion well without complaints or incident. Port flushed,saline locked and left accessed for use tomorrow. VSS upon discharge. Pt discharged self ambulatory in satisfactory condition

## 2018-01-03 ENCOUNTER — Inpatient Hospital Stay (HOSPITAL_COMMUNITY): Payer: Medicare HMO

## 2018-01-03 ENCOUNTER — Other Ambulatory Visit (HOSPITAL_COMMUNITY): Payer: Medicare HMO

## 2018-01-03 VITALS — BP 132/59 | HR 69 | Temp 98.2°F | Resp 18

## 2018-01-03 DIAGNOSIS — C9200 Acute myeloblastic leukemia, not having achieved remission: Secondary | ICD-10-CM

## 2018-01-03 DIAGNOSIS — R5383 Other fatigue: Secondary | ICD-10-CM | POA: Diagnosis not present

## 2018-01-03 DIAGNOSIS — R531 Weakness: Secondary | ICD-10-CM | POA: Diagnosis not present

## 2018-01-03 DIAGNOSIS — M25559 Pain in unspecified hip: Secondary | ICD-10-CM | POA: Diagnosis not present

## 2018-01-03 DIAGNOSIS — M545 Low back pain: Secondary | ICD-10-CM | POA: Diagnosis not present

## 2018-01-03 LAB — COMPREHENSIVE METABOLIC PANEL
ALT: 33 U/L (ref 0–44)
ANION GAP: 6 (ref 5–15)
AST: 28 U/L (ref 15–41)
Albumin: 4.1 g/dL (ref 3.5–5.0)
Alkaline Phosphatase: 71 U/L (ref 38–126)
BILIRUBIN TOTAL: 0.6 mg/dL (ref 0.3–1.2)
BUN: 14 mg/dL (ref 8–23)
CO2: 27 mmol/L (ref 22–32)
Calcium: 8.8 mg/dL — ABNORMAL LOW (ref 8.9–10.3)
Chloride: 104 mmol/L (ref 98–111)
Creatinine, Ser: 0.95 mg/dL (ref 0.61–1.24)
GFR calc Af Amer: >60 mL/min (ref >60)
Glucose, Bld: 139 mg/dL — ABNORMAL HIGH (ref 70–99)
POTASSIUM: 3.6 mmol/L (ref 3.5–5.1)
Sodium: 137 mmol/L (ref 135–145)
TOTAL PROTEIN: 6.6 g/dL (ref 6.5–8.1)

## 2018-01-03 LAB — CBC WITH DIFFERENTIAL/PLATELET
BASOS ABS: 0 10*3/uL (ref 0.0–0.1)
BASOS PCT: 0 %
EOS PCT: 7 %
Eosinophils Absolute: 0.3 10*3/uL (ref 0.0–0.7)
HCT: 37.8 % — ABNORMAL LOW (ref 39.0–52.0)
Hemoglobin: 12.6 g/dL — ABNORMAL LOW (ref 13.0–17.0)
LYMPHS ABS: 0.7 10*3/uL (ref 0.7–4.0)
LYMPHS PCT: 15 %
MCH: 34.1 pg — ABNORMAL HIGH (ref 26.0–34.0)
MCHC: 33.3 g/dL (ref 30.0–36.0)
MCV: 102.2 fL — AB (ref 78.0–100.0)
MONO ABS: 0.2 10*3/uL (ref 0.1–1.0)
MONOS PCT: 4 %
NEUTROS ABS: 3.4 10*3/uL (ref 1.7–7.7)
Neutrophils Relative %: 74 %
Platelets: 175 10*3/uL (ref 150–400)
RBC: 3.7 MIL/uL — ABNORMAL LOW (ref 4.22–5.81)
RDW: 15.6 % — AB (ref 11.5–15.5)
WBC: 4.6 10*3/uL (ref 4.0–10.5)

## 2018-01-03 LAB — MAGNESIUM: MAGNESIUM: 2.1 mg/dL (ref 1.7–2.4)

## 2018-01-03 MED ORDER — SODIUM CHLORIDE 0.9 % IV SOLN
Freq: Once | INTRAVENOUS | Status: AC
  Administered 2018-01-03: 13:00:00 via INTRAVENOUS

## 2018-01-03 MED ORDER — PROCHLORPERAZINE MALEATE 10 MG PO TABS
10.0000 mg | ORAL_TABLET | Freq: Once | ORAL | Status: AC
  Administered 2018-01-03: 10 mg via ORAL

## 2018-01-03 MED ORDER — SODIUM CHLORIDE 0.9 % IV SOLN
20.0000 mg/m2 | Freq: Once | INTRAVENOUS | Status: AC
  Administered 2018-01-03: 40 mg via INTRAVENOUS
  Filled 2018-01-03: qty 8

## 2018-01-03 MED ORDER — PROCHLORPERAZINE MALEATE 10 MG PO TABS
ORAL_TABLET | ORAL | Status: AC
  Filled 2018-01-03: qty 1

## 2018-01-03 MED ORDER — SODIUM CHLORIDE 0.9% FLUSH
10.0000 mL | INTRAVENOUS | Status: DC | PRN
  Administered 2018-01-03: 10 mL
  Filled 2018-01-03: qty 10

## 2018-01-03 NOTE — Patient Instructions (Signed)
[facility] Discharge Instructions for Patients Receiving Chemotherapy   Beginning January 23rd 2017 lab work for the [facility] will be done in the  Main lab at [facility] on 1st floor. If you have a lab appointment with the [place] [facility] please come in thru the  Main Entrance and check in at the main information desk   Today you received the following chemotherapy agents Decitabine. Follow up as scheduled. Call clinic for any questions or concerns.    If you develop nausea and vomiting, or diarrhea that is not controlled by your medication, call the clinic.  The clinic phone number is [phone]. Office hours are Monday-Friday 8:30am-5:00pm.  BELOW ARE SYMPTOMS THAT SHOULD BE REPORTED IMMEDIATELY:  *FEVER GREATER THAN 101.0 F  *CHILLS WITH OR WITHOUT FEVER  NAUSEA AND VOMITING THAT IS NOT CONTROLLED WITH YOUR NAUSEA MEDICATION  *UNUSUAL SHORTNESS OF BREATH  *UNUSUAL BRUISING OR BLEEDING  TENDERNESS IN MOUTH AND THROAT WITH OR WITHOUT PRESENCE OF ULCERS  *URINARY PROBLEMS  *BOWEL PROBLEMS  UNUSUAL RASH Items with * indicate a potential emergency and should be followed up as soon as possible. If you have an emergency after office hours please contact your primary care physician or go to the nearest emergency department.  Please call the clinic during office hours if you have any questions or concerns.   You may also contact the Patient Navigator at [phone] should you have any questions or need assistance in obtaining follow up care.      Resources For Cancer Patients and their Caregivers ? American Cancer Society: Can assist with transportation, wigs, general needs, runs Look Good Feel Better.        920-257-9354 ? Cancer Care: Provides financial assistance, online support groups, medication/co-pay assistance.  1-800-813-HOPE (343)424-1159) ? [facility] Assists [place] cancer patients and their families through  emotional , educational and financial support.  (870)209-4829 ? [place] DSS Where to apply for food stamps, Medicaid and utility assistance. 3197232139 ? [facility]ATS: Transportation to medical appointments. 424-798-9415 ? Social Security Administration: May apply for disability if have a Stage IV cancer. [phone] ([phone] ? [employer], Disability and Transit Services: Assists with nutrition, care and transit needs. [phone]

## 2018-01-03 NOTE — Progress Notes (Signed)
1340 labs reviewed and patient approved for treatment today. Two copies of lab work given to pt per his request. A copy of his appointment schedule  also given to him.   Darcel Smalling tolerated Decitabine infusion without incident or complaint. VSS upon completion of treatment. Port flushed and saline locked for use tomorrow. Pt reminded of appointment time for tomorrow.Verbalized understanding. Discharged self ambulatory in satisfactory condition.

## 2018-01-04 ENCOUNTER — Inpatient Hospital Stay (HOSPITAL_COMMUNITY): Payer: Medicare HMO

## 2018-01-04 ENCOUNTER — Encounter (HOSPITAL_COMMUNITY): Payer: Self-pay

## 2018-01-04 ENCOUNTER — Ambulatory Visit (HOSPITAL_COMMUNITY): Payer: Medicare HMO

## 2018-01-04 VITALS — BP 118/57 | HR 66 | Temp 98.0°F | Resp 18

## 2018-01-04 DIAGNOSIS — C9200 Acute myeloblastic leukemia, not having achieved remission: Secondary | ICD-10-CM | POA: Diagnosis not present

## 2018-01-04 DIAGNOSIS — M545 Low back pain: Secondary | ICD-10-CM | POA: Diagnosis not present

## 2018-01-04 DIAGNOSIS — R5383 Other fatigue: Secondary | ICD-10-CM | POA: Diagnosis not present

## 2018-01-04 DIAGNOSIS — M25559 Pain in unspecified hip: Secondary | ICD-10-CM | POA: Diagnosis not present

## 2018-01-04 DIAGNOSIS — R531 Weakness: Secondary | ICD-10-CM | POA: Diagnosis not present

## 2018-01-04 MED ORDER — PROCHLORPERAZINE MALEATE 10 MG PO TABS
ORAL_TABLET | ORAL | Status: AC
  Filled 2018-01-04: qty 1

## 2018-01-04 MED ORDER — PROCHLORPERAZINE MALEATE 10 MG PO TABS
10.0000 mg | ORAL_TABLET | Freq: Once | ORAL | Status: AC
  Administered 2018-01-04: 10 mg via ORAL

## 2018-01-04 MED ORDER — SODIUM CHLORIDE 0.9 % IV SOLN
Freq: Once | INTRAVENOUS | Status: AC
  Administered 2018-01-04: 09:00:00 via INTRAVENOUS

## 2018-01-04 MED ORDER — SODIUM CHLORIDE 0.9% FLUSH
10.0000 mL | INTRAVENOUS | Status: DC | PRN
  Administered 2018-01-04: 10 mL
  Filled 2018-01-04: qty 10

## 2018-01-04 MED ORDER — SODIUM CHLORIDE 0.9 % IV SOLN
20.0000 mg/m2 | Freq: Once | INTRAVENOUS | Status: AC
  Administered 2018-01-04: 40 mg via INTRAVENOUS
  Filled 2018-01-04: qty 8

## 2018-01-04 MED ORDER — HEPARIN SOD (PORK) LOCK FLUSH 100 UNIT/ML IV SOLN
500.0000 [IU] | Freq: Once | INTRAVENOUS | Status: AC | PRN
  Administered 2018-01-04: 500 [IU]

## 2018-01-04 NOTE — Progress Notes (Signed)
Nutrition Follow-up:  Patient with AML with myelodysplasia Patient followed at [facility].    Noted started on remeron recently by [facility] MD.  Met with patient during infusion today.  Patient reports that he is eating what he wants to and not feeling guilty about it due to loosing so much weight.  Patient reports that he ate an omelette with cheese this am with collards.  Reports ate potato soup last night. Struggles with memory so unable to tell RD about a typical day.  Reports he eats when he is hungry and stops when he is full.  Reports sometimes will eat canned salmon with mayo, ate 1/2 of steak recently.  Likes fruits and vegetables. Does not like oral nutrition supplements and will not drink them  Reports that he feels weak and has pain.    Medications: reviewed, remeron added. Patient does not know if he is taking this  Labs: reviewed  Anthropometrics:   Weight is 172 lb today decreased from 188 lb on 03/30/17  8% weight loss in the last 9 months  NUTRITION DIAGNOSIS: Inadequate oral intake stable   INTERVENTION:  Discussed ways to add calories and protein to current eating pattern. Encouraged eating every 2 hours whether hungry or not.  Can set alarm as a reminder to eat.  Encouraged eating good sources of protein and examples of foods provided Agree that patient should liberalize diet and eat what he is able to eat with weight loss.    MONITORING, EVALUATION, GOAL: Patient will tolerate adequate calories and protein to maintain weight   NEXT VISIT: November 1 during infusion  Kayle Correa B. Zenia Resides, [place], [place] Registered Dietitian [phone] (pager)

## 2018-01-04 NOTE — Progress Notes (Signed)
Omar Lee tolerated Decitabine infusion well without complaints or incident. VSS upon discharge. Pt discharged self ambulatory in satisfactory condition

## 2018-01-04 NOTE — Patient Instructions (Signed)
[facility] Discharge Instructions for Patients Receiving Chemotherapy   Beginning January 23rd 2017 lab work for the Cancer Center will be done in the  Main lab at [facility] on 1st floor. If you have a lab appointment with the Cancer Center please come in thru the  Main Entrance and check in at the main information desk   Today you received the following chemotherapy agents Decitabine. Follow-up as scheduled. Call clinic for any questions or concerns  To help prevent nausea and vomiting after your treatment, we encourage you to take your nausea medication   If you develop nausea and vomiting, or diarrhea that is not controlled by your medication, call the clinic.  The clinic phone number is [phone]. Office hours are Monday-Friday 8:30am-5:00pm.  BELOW ARE SYMPTOMS THAT SHOULD BE REPORTED IMMEDIATELY:  *FEVER GREATER THAN 101.0 F  *CHILLS WITH OR WITHOUT FEVER  NAUSEA AND VOMITING THAT IS NOT CONTROLLED WITH YOUR NAUSEA MEDICATION  *UNUSUAL SHORTNESS OF BREATH  *UNUSUAL BRUISING OR BLEEDING  TENDERNESS IN MOUTH AND THROAT WITH OR WITHOUT PRESENCE OF ULCERS  *URINARY PROBLEMS  *BOWEL PROBLEMS  UNUSUAL RASH Items with * indicate a potential emergency and should be followed up as soon as possible. If you have an emergency after office hours please contact your primary care physician or go to the nearest emergency department.  Please call the clinic during office hours if you have any questions or concerns.   You may also contact the Patient Navigator at [phone] should you have any questions or need assistance in obtaining follow up care.      Resources For Cancer Patients and their Caregivers ? American Cancer Society: Can assist with transportation, wigs, general needs, runs Look Good Feel Better.        1-888-227-6333 ? Cancer Care: Provides financial assistance, online support groups, medication/co-pay assistance.  1-800-813-HOPE  (4673) ? [facility] Assists [place] cancer patients and their families through emotional , educational and financial support.  336-427-4357 ? [place] DSS Where to apply for food stamps, Medicaid and utility assistance. 336-342-1394 ? [facility]: Transportation to medical appointments. 336-347-2287 ? Social Security Administration: May apply for disability if have a Stage IV cancer. 336-342-7796 1-800-772-1213 ? [place] Aging, Disability and Transit Services: Assists with nutrition, care and transit needs. 336-349-2343         

## 2018-01-07 ENCOUNTER — Other Ambulatory Visit (HOSPITAL_COMMUNITY): Payer: Medicare HMO

## 2018-01-14 ENCOUNTER — Inpatient Hospital Stay (HOSPITAL_COMMUNITY): Payer: Medicare HMO

## 2018-01-14 DIAGNOSIS — M25559 Pain in unspecified hip: Secondary | ICD-10-CM | POA: Diagnosis not present

## 2018-01-14 DIAGNOSIS — R5383 Other fatigue: Secondary | ICD-10-CM | POA: Diagnosis not present

## 2018-01-14 DIAGNOSIS — M545 Low back pain: Secondary | ICD-10-CM | POA: Diagnosis not present

## 2018-01-14 DIAGNOSIS — R531 Weakness: Secondary | ICD-10-CM | POA: Diagnosis not present

## 2018-01-14 DIAGNOSIS — C9200 Acute myeloblastic leukemia, not having achieved remission: Secondary | ICD-10-CM | POA: Diagnosis not present

## 2018-01-14 LAB — CBC WITH DIFFERENTIAL/PLATELET
Basophils Absolute: 0 10*3/uL (ref 0.0–0.1)
Basophils Relative: 0 %
EOS ABS: 0 10*3/uL (ref 0.0–0.7)
Eosinophils Relative: 1 %
HEMATOCRIT: 35.8 % — AB (ref 39.0–52.0)
Hemoglobin: 12.2 g/dL — ABNORMAL LOW (ref 13.0–17.0)
LYMPHS ABS: 0.4 10*3/uL — AB (ref 0.7–4.0)
Lymphocytes Relative: 19 %
MCH: 34.6 pg — AB (ref 26.0–34.0)
MCHC: 34.1 g/dL (ref 30.0–36.0)
MCV: 101.4 fL — ABNORMAL HIGH (ref 78.0–100.0)
Monocytes Absolute: 0.1 10*3/uL (ref 0.1–1.0)
Monocytes Relative: 2 %
NEUTROS ABS: 1.8 10*3/uL (ref 1.7–7.7)
Neutrophils Relative %: 78 %
Platelets: 71 10*3/uL — ABNORMAL LOW (ref 150–400)
RBC: 3.53 MIL/uL — AB (ref 4.22–5.81)
RDW: 15.4 % (ref 11.5–15.5)
WBC: 2.3 10*3/uL — AB (ref 4.0–10.5)

## 2018-01-14 LAB — COMPREHENSIVE METABOLIC PANEL
ALBUMIN: 4.1 g/dL (ref 3.5–5.0)
ALK PHOS: 65 U/L (ref 38–126)
ALT: 57 U/L — AB (ref 0–44)
AST: 32 U/L (ref 15–41)
Anion gap: 9 (ref 5–15)
BILIRUBIN TOTAL: 1 mg/dL (ref 0.3–1.2)
BUN: 15 mg/dL (ref 8–23)
CALCIUM: 8.9 mg/dL (ref 8.9–10.3)
CO2: 26 mmol/L (ref 22–32)
CREATININE: 0.93 mg/dL (ref 0.61–1.24)
Chloride: 104 mmol/L (ref 98–111)
GFR calc Af Amer: >60 mL/min (ref >60)
GFR calc non Af Amer: >60 mL/min (ref >60)
GLUCOSE: 117 mg/dL — AB (ref 70–99)
Potassium: 3.6 mmol/L (ref 3.5–5.1)
SODIUM: 139 mmol/L (ref 135–145)
Total Protein: 6.8 g/dL (ref 6.5–8.1)

## 2018-01-14 LAB — MAGNESIUM: Magnesium: 2 mg/dL (ref 1.7–2.4)

## 2018-01-21 ENCOUNTER — Inpatient Hospital Stay (HOSPITAL_COMMUNITY): Payer: Medicare HMO | Attending: Hematology

## 2018-01-21 DIAGNOSIS — C9200 Acute myeloblastic leukemia, not having achieved remission: Secondary | ICD-10-CM

## 2018-01-21 DIAGNOSIS — M545 Low back pain: Secondary | ICD-10-CM | POA: Diagnosis not present

## 2018-01-21 DIAGNOSIS — Z79899 Other long term (current) drug therapy: Secondary | ICD-10-CM | POA: Insufficient documentation

## 2018-01-21 LAB — COMPREHENSIVE METABOLIC PANEL
ALK PHOS: 65 U/L (ref 38–126)
ALT: 58 U/L — AB (ref 0–44)
AST: 33 U/L (ref 15–41)
Albumin: 4.2 g/dL (ref 3.5–5.0)
Anion gap: 7 (ref 5–15)
BILIRUBIN TOTAL: 0.7 mg/dL (ref 0.3–1.2)
BUN: 16 mg/dL (ref 8–23)
CO2: 27 mmol/L (ref 22–32)
Calcium: 8.8 mg/dL — ABNORMAL LOW (ref 8.9–10.3)
Chloride: 102 mmol/L (ref 98–111)
Creatinine, Ser: 0.96 mg/dL (ref 0.61–1.24)
GFR calc non Af Amer: >60 mL/min (ref >60)
Glucose, Bld: 78 mg/dL (ref 70–99)
Potassium: 4.1 mmol/L (ref 3.5–5.1)
SODIUM: 136 mmol/L (ref 135–145)
Total Protein: 6.7 g/dL (ref 6.5–8.1)

## 2018-01-21 LAB — CBC WITH DIFFERENTIAL/PLATELET
Basophils Absolute: 0 10*3/uL (ref 0.0–0.1)
Basophils Relative: 0 %
EOS ABS: 0 10*3/uL (ref 0.0–0.7)
Eosinophils Relative: 1 %
HEMATOCRIT: 35.7 % — AB (ref 39.0–52.0)
HEMOGLOBIN: 11.9 g/dL — AB (ref 13.0–17.0)
LYMPHS ABS: 0.5 10*3/uL — AB (ref 0.7–4.0)
LYMPHS PCT: 29 %
MCH: 33.9 pg (ref 26.0–34.0)
MCHC: 33.3 g/dL (ref 30.0–36.0)
MCV: 101.7 fL — ABNORMAL HIGH (ref 78.0–100.0)
Monocytes Absolute: 0.1 10*3/uL (ref 0.1–1.0)
Monocytes Relative: 8 %
NEUTROS ABS: 1 10*3/uL — AB (ref 1.7–7.7)
NEUTROS PCT: 62 %
Platelets: 105 10*3/uL — ABNORMAL LOW (ref 150–400)
RBC: 3.51 MIL/uL — AB (ref 4.22–5.81)
RDW: 16 % — ABNORMAL HIGH (ref 11.5–15.5)
WBC: 1.6 10*3/uL — AB (ref 4.0–10.5)

## 2018-01-21 LAB — MAGNESIUM: Magnesium: 2.1 mg/dL (ref 1.7–2.4)

## 2018-01-28 ENCOUNTER — Inpatient Hospital Stay (HOSPITAL_COMMUNITY): Payer: Medicare HMO

## 2018-01-28 DIAGNOSIS — Z79899 Other long term (current) drug therapy: Secondary | ICD-10-CM | POA: Diagnosis not present

## 2018-01-28 DIAGNOSIS — C9200 Acute myeloblastic leukemia, not having achieved remission: Secondary | ICD-10-CM

## 2018-01-28 DIAGNOSIS — M545 Low back pain: Secondary | ICD-10-CM | POA: Diagnosis not present

## 2018-01-28 LAB — CBC WITH DIFFERENTIAL/PLATELET
ABS IMMATURE GRANULOCYTES: 0.01 10*3/uL (ref 0.00–0.07)
Basophils Absolute: 0 10*3/uL (ref 0.0–0.1)
Basophils Relative: 1 %
EOS ABS: 0 10*3/uL (ref 0.0–0.5)
Eosinophils Relative: 0 %
HEMATOCRIT: 35.5 % — AB (ref 39.0–52.0)
HEMOGLOBIN: 11.8 g/dL — AB (ref 13.0–17.0)
IMMATURE GRANULOCYTES: 1 %
LYMPHS ABS: 0.3 10*3/uL — AB (ref 0.7–4.0)
Lymphocytes Relative: 23 %
MCH: 33.9 pg (ref 26.0–34.0)
MCHC: 33.2 g/dL (ref 30.0–36.0)
MCV: 102 fL — AB (ref 80.0–100.0)
MONO ABS: 0.1 10*3/uL (ref 0.1–1.0)
Monocytes Relative: 5 %
NEUTROS PCT: 70 %
Neutro Abs: 1 10*3/uL — ABNORMAL LOW (ref 1.7–7.7)
Platelets: 174 10*3/uL (ref 150–400)
RBC: 3.48 MIL/uL — ABNORMAL LOW (ref 4.22–5.81)
RDW: 15.8 % — ABNORMAL HIGH (ref 11.5–15.5)
WBC: 1.4 10*3/uL — CL (ref 4.0–10.5)
nRBC: 0 % (ref 0.0–0.2)

## 2018-01-28 LAB — COMPREHENSIVE METABOLIC PANEL
ALT: 55 U/L — AB (ref 0–44)
ANION GAP: 7 (ref 5–15)
AST: 33 U/L (ref 15–41)
Albumin: 4.3 g/dL (ref 3.5–5.0)
Alkaline Phosphatase: 69 U/L (ref 38–126)
BUN: 17 mg/dL (ref 8–23)
CHLORIDE: 102 mmol/L (ref 98–111)
CO2: 27 mmol/L (ref 22–32)
Calcium: 9.2 mg/dL (ref 8.9–10.3)
Creatinine, Ser: 1.18 mg/dL (ref 0.61–1.24)
GFR calc non Af Amer: 57 mL/min — ABNORMAL LOW (ref >60)
Glucose, Bld: 119 mg/dL — ABNORMAL HIGH (ref 70–99)
POTASSIUM: 3.7 mmol/L (ref 3.5–5.1)
Sodium: 136 mmol/L (ref 135–145)
Total Bilirubin: 1 mg/dL (ref 0.3–1.2)
Total Protein: 6.8 g/dL (ref 6.5–8.1)

## 2018-01-28 LAB — MAGNESIUM: MAGNESIUM: 2 mg/dL (ref 1.7–2.4)

## 2018-01-28 LAB — SAMPLE TO BLOOD BANK

## 2018-01-28 NOTE — Progress Notes (Unsigned)
CRITICAL VALUE ALERT  Critical Value:  WBC 1.4  Date & Time Notied:  01/28/2018 1006  Provider Notified: faxed to Shelbie Hutching, NP at [facility]  Orders Received/Actions taken: none

## 2018-02-03 ENCOUNTER — Other Ambulatory Visit: Payer: Self-pay | Admitting: Family Medicine

## 2018-02-04 ENCOUNTER — Ambulatory Visit (HOSPITAL_COMMUNITY): Payer: Medicare HMO

## 2018-02-04 ENCOUNTER — Other Ambulatory Visit (HOSPITAL_COMMUNITY): Payer: Medicare HMO

## 2018-02-04 ENCOUNTER — Inpatient Hospital Stay (HOSPITAL_COMMUNITY): Payer: Medicare HMO

## 2018-02-04 ENCOUNTER — Ambulatory Visit (HOSPITAL_COMMUNITY): Payer: Medicare HMO | Admitting: Hematology

## 2018-02-04 DIAGNOSIS — C9200 Acute myeloblastic leukemia, not having achieved remission: Secondary | ICD-10-CM

## 2018-02-04 DIAGNOSIS — Z79899 Other long term (current) drug therapy: Secondary | ICD-10-CM | POA: Diagnosis not present

## 2018-02-04 DIAGNOSIS — M545 Low back pain: Secondary | ICD-10-CM | POA: Diagnosis not present

## 2018-02-04 LAB — COMPREHENSIVE METABOLIC PANEL
ALT: 39 U/L (ref 0–44)
AST: 26 U/L (ref 15–41)
Albumin: 4.5 g/dL (ref 3.5–5.0)
Alkaline Phosphatase: 68 U/L (ref 38–126)
Anion gap: 6 (ref 5–15)
BUN: 16 mg/dL (ref 8–23)
CHLORIDE: 101 mmol/L (ref 98–111)
CO2: 29 mmol/L (ref 22–32)
Calcium: 9.3 mg/dL (ref 8.9–10.3)
Creatinine, Ser: 1.06 mg/dL (ref 0.61–1.24)
GFR calc Af Amer: >60 mL/min (ref >60)
Glucose, Bld: 115 mg/dL — ABNORMAL HIGH (ref 70–99)
Potassium: 3.9 mmol/L (ref 3.5–5.1)
SODIUM: 136 mmol/L (ref 135–145)
Total Bilirubin: 1 mg/dL (ref 0.3–1.2)
Total Protein: 7 g/dL (ref 6.5–8.1)

## 2018-02-04 LAB — CBC WITH DIFFERENTIAL/PLATELET
ABS IMMATURE GRANULOCYTES: 0.08 10*3/uL — AB (ref 0.00–0.07)
BASOS ABS: 0 10*3/uL (ref 0.0–0.1)
Basophils Relative: 1 %
Eosinophils Absolute: 0 10*3/uL (ref 0.0–0.5)
Eosinophils Relative: 1 %
HCT: 38.2 % — ABNORMAL LOW (ref 39.0–52.0)
Hemoglobin: 12.1 g/dL — ABNORMAL LOW (ref 13.0–17.0)
IMMATURE GRANULOCYTES: 4 %
LYMPHS PCT: 23 %
Lymphs Abs: 0.5 10*3/uL — ABNORMAL LOW (ref 0.7–4.0)
MCH: 32.7 pg (ref 26.0–34.0)
MCHC: 31.7 g/dL (ref 30.0–36.0)
MCV: 103.2 fL — AB (ref 80.0–100.0)
MONO ABS: 0.1 10*3/uL (ref 0.1–1.0)
Monocytes Relative: 6 %
NEUTROS ABS: 1.3 10*3/uL — AB (ref 1.7–7.7)
Neutrophils Relative %: 65 %
PLATELETS: 168 10*3/uL (ref 150–400)
RBC: 3.7 MIL/uL — AB (ref 4.22–5.81)
RDW: 16.3 % — AB (ref 11.5–15.5)
WBC: 2 10*3/uL — AB (ref 4.0–10.5)
nRBC: 0 % (ref 0.0–0.2)

## 2018-02-04 LAB — MAGNESIUM: MAGNESIUM: 2.1 mg/dL (ref 1.7–2.4)

## 2018-02-05 ENCOUNTER — Ambulatory Visit (HOSPITAL_COMMUNITY): Payer: Medicare HMO

## 2018-02-06 ENCOUNTER — Ambulatory Visit (HOSPITAL_COMMUNITY): Payer: Medicare HMO

## 2018-02-07 ENCOUNTER — Ambulatory Visit (HOSPITAL_COMMUNITY): Payer: Medicare HMO

## 2018-02-08 ENCOUNTER — Ambulatory Visit (HOSPITAL_COMMUNITY): Payer: Medicare HMO

## 2018-02-08 DIAGNOSIS — R2689 Other abnormalities of gait and mobility: Secondary | ICD-10-CM | POA: Diagnosis not present

## 2018-02-08 DIAGNOSIS — M79605 Pain in left leg: Secondary | ICD-10-CM | POA: Diagnosis not present

## 2018-02-08 DIAGNOSIS — E039 Hypothyroidism, unspecified: Secondary | ICD-10-CM | POA: Diagnosis not present

## 2018-02-08 DIAGNOSIS — Z95828 Presence of other vascular implants and grafts: Secondary | ICD-10-CM | POA: Diagnosis not present

## 2018-02-08 DIAGNOSIS — M79604 Pain in right leg: Secondary | ICD-10-CM | POA: Diagnosis not present

## 2018-02-08 DIAGNOSIS — C9200 Acute myeloblastic leukemia, not having achieved remission: Secondary | ICD-10-CM | POA: Diagnosis not present

## 2018-02-08 DIAGNOSIS — E785 Hyperlipidemia, unspecified: Secondary | ICD-10-CM | POA: Diagnosis not present

## 2018-02-11 ENCOUNTER — Other Ambulatory Visit (HOSPITAL_COMMUNITY): Payer: Self-pay

## 2018-02-11 ENCOUNTER — Inpatient Hospital Stay (HOSPITAL_COMMUNITY): Payer: Medicare HMO

## 2018-02-11 ENCOUNTER — Inpatient Hospital Stay (HOSPITAL_BASED_OUTPATIENT_CLINIC_OR_DEPARTMENT_OTHER): Payer: Medicare HMO | Admitting: Hematology

## 2018-02-11 ENCOUNTER — Other Ambulatory Visit: Payer: Self-pay

## 2018-02-11 ENCOUNTER — Encounter (HOSPITAL_COMMUNITY): Payer: Self-pay | Admitting: Hematology

## 2018-02-11 DIAGNOSIS — R296 Repeated falls: Secondary | ICD-10-CM | POA: Diagnosis not present

## 2018-02-11 DIAGNOSIS — Z79899 Other long term (current) drug therapy: Secondary | ICD-10-CM | POA: Diagnosis not present

## 2018-02-11 DIAGNOSIS — M545 Low back pain: Secondary | ICD-10-CM | POA: Diagnosis not present

## 2018-02-11 DIAGNOSIS — C9200 Acute myeloblastic leukemia, not having achieved remission: Secondary | ICD-10-CM | POA: Diagnosis not present

## 2018-02-11 DIAGNOSIS — R5383 Other fatigue: Secondary | ICD-10-CM | POA: Diagnosis not present

## 2018-02-11 LAB — COMPREHENSIVE METABOLIC PANEL
ALT: 28 U/L (ref 0–44)
ANION GAP: 9 (ref 5–15)
AST: 23 U/L (ref 15–41)
Albumin: 4.1 g/dL (ref 3.5–5.0)
Alkaline Phosphatase: 62 U/L (ref 38–126)
BILIRUBIN TOTAL: 0.9 mg/dL (ref 0.3–1.2)
BUN: 12 mg/dL (ref 8–23)
CO2: 26 mmol/L (ref 22–32)
Calcium: 8.8 mg/dL — ABNORMAL LOW (ref 8.9–10.3)
Chloride: 102 mmol/L (ref 98–111)
Creatinine, Ser: 0.98 mg/dL (ref 0.61–1.24)
GFR calc Af Amer: >60 mL/min (ref >60)
Glucose, Bld: 97 mg/dL (ref 70–99)
POTASSIUM: 3.8 mmol/L (ref 3.5–5.1)
Sodium: 137 mmol/L (ref 135–145)
TOTAL PROTEIN: 6.6 g/dL (ref 6.5–8.1)

## 2018-02-11 LAB — CBC WITH DIFFERENTIAL/PLATELET
Abs Immature Granulocytes: 0.06 10*3/uL (ref 0.00–0.07)
BASOS PCT: 1 %
Basophils Absolute: 0 10*3/uL (ref 0.0–0.1)
EOS ABS: 0 10*3/uL (ref 0.0–0.5)
EOS PCT: 1 %
HEMATOCRIT: 36.2 % — AB (ref 39.0–52.0)
Hemoglobin: 11.5 g/dL — ABNORMAL LOW (ref 13.0–17.0)
Immature Granulocytes: 4 %
LYMPHS ABS: 0.5 10*3/uL — AB (ref 0.7–4.0)
Lymphocytes Relative: 31 %
MCH: 33 pg (ref 26.0–34.0)
MCHC: 31.8 g/dL (ref 30.0–36.0)
MCV: 104 fL — AB (ref 80.0–100.0)
MONOS PCT: 8 %
Monocytes Absolute: 0.1 10*3/uL (ref 0.1–1.0)
NRBC: 0 % (ref 0.0–0.2)
Neutro Abs: 0.9 10*3/uL — ABNORMAL LOW (ref 1.7–7.7)
Neutrophils Relative %: 55 %
PLATELETS: 125 10*3/uL — AB (ref 150–400)
RBC: 3.48 MIL/uL — ABNORMAL LOW (ref 4.22–5.81)
RDW: 16.4 % — AB (ref 11.5–15.5)
WBC: 1.6 10*3/uL — ABNORMAL LOW (ref 4.0–10.5)

## 2018-02-11 LAB — SAMPLE TO BLOOD BANK

## 2018-02-11 LAB — MAGNESIUM: MAGNESIUM: 2.1 mg/dL (ref 1.7–2.4)

## 2018-02-11 NOTE — Progress Notes (Signed)
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  Cytogenetics with minus Y only.  IPSS-R 3.5, intermediate risk (0 for cyto, 2.0 for blasts, 1.5 for Hb, 0 for platelets, 0 for ANC).    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  FLT3-ITD/TKD negative.  AML panel positive for NRAS,STAG2,TET2, U2AF1, DNMT3A    07/13/2017 -  Chemotherapy    C1 decitabine 20 mg/m2 IV daily x5 days with venetoclax 400 mg daily.  Given Hydrea prior to starting when it collects due to leukocytosis.       INTERVAL HISTORY:  Omar Lee 77 y.o. male returns for follow-up of leukemia and possible treatments.  He complains of severe fatigue.  He reportedly fell at his house couple of times in the last 2 weeks.  Denies any fevers or infections.  His venetoclax is continuing to be on hold.  He is taking prophylactic antibiotics.  Energy levels are 25% and appetite at 50%.  He does report occasional lightheadedness.  No recent hospitalizations.     REVIEW OF SYSTEMS:  Review of Systems  Constitutional: Positive for fatigue.  Gastrointestinal: Positive for constipation.  Musculoskeletal: Positive for back pain.  Neurological: Positive for dizziness. Negative for extremity weakness.  Hematological: Bruises/bleeds easily.  All other systems reviewed and are  negative.    PAST MEDICAL/SURGICAL HISTORY:  Past Medical History:  Diagnosis Date  . Cancer ([place])    MDS  . Hyperlipidemia   . Hypothyroidism   . Reflux   . Sleep apnea    Past Surgical History:  Procedure Laterality Date  . ESOPHAGOGASTRODUODENOSCOPY (EGD) WITH ESOPHAGEAL DILATION N/A 11/22/2012   Procedure: ESOPHAGOGASTRODUODENOSCOPY (EGD) WITH ESOPHAGEAL DILATION;  Surgeon: Rogene [place], MD;  Location: [facility] SUITE;  Service: Endoscopy;  Laterality: N/A;  1120  . HEMORRHOID SURGERY    . NASAL SEPTUM SURGERY    . PORTACATH PLACEMENT Left 11/02/2017   Procedure: INSERTION PORT WITH ATTACHED CATHETER LEFT SUBCLAVIAN;  Surgeon: Aviva Signs, MD;  Location: [facility] ORS;  Service: General;  Laterality: Left;  . SHOULDER SURGERY Right   . TONSILLECTOMY AND ADENOIDECTOMY       SOCIAL HISTORY:  Social History   Socioeconomic History  . Marital status: Divorced    Spouse name: Not on file  . Number of children: Not on file  . Years of education: Not on file  . Highest education level: Not on file  Occupational History  . Not on file  Social Needs  . Financial resource strain: Not on file  . Food insecurity:    Worry: Not on file    Inability: Not on file  . Transportation needs:    Medical: Not on file    Non-medical: Not on file  Tobacco Use  . Smoking status: Never Smoker  . Smokeless tobacco: Never Used  Substance  and Sexual Activity  . Alcohol use: No  . Drug use: No  . Sexual activity: Not Currently    Birth control/protection: None  Lifestyle  . Physical activity:    Days per week: Not on file    Minutes per session: Not on file  . Stress: Not on file  Relationships  . Social connections:    Talks on phone: Not on file    Gets together: Not on file    Attends religious service: Not on file    Active member of club or organization: Not on file    Attends meetings of clubs or organizations: Not on file    Relationship status: Not on file  . Intimate  partner violence:    Fear of current or ex partner: Not on file    Emotionally abused: Not on file    Physically abused: Not on file    Forced sexual activity: Not on file  Other Topics Concern  . Not on file  Social History Narrative  . Not on file    FAMILY HISTORY:  Family History  Problem Relation Age of Onset  . Hypertension Mother   . Heart attack Mother   . Heart attack Father   . Diabetes Brother     CURRENT MEDICATIONS:  Outpatient Encounter Medications as of 02/11/2018  Medication Sig Note  . traMADol (ULTRAM) 50 MG tablet Take by mouth.   Marland Kitchen acetaminophen (TYLENOL) 325 MG tablet Take 650 mg by mouth as needed.   Marland Kitchen allopurinol (ZYLOPRIM) 300 MG tablet Take 1 tablet (300 mg total) by mouth daily.   . fluconazole (DIFLUCAN) 200 MG tablet TK 1 T PO QD WHILE  NEUTROPENIC   . levofloxacin (LEVAQUIN) 500 MG tablet TK 1 T PO QD WHILE NEUTROPENIC   . levothyroxine (SYNTHROID, LEVOTHROID) 125 MCG tablet TAKE 1 TABLET DAILY (NEED OFFICE VISIT)   . mirtazapine (REMERON) 7.5 MG tablet Take by mouth.   . mirtazapine (REMERON) 7.5 MG tablet    . pantoprazole (PROTONIX) 40 MG tablet    . traMADol (ULTRAM) 50 MG tablet Take 2 tablets (100 mg total) by mouth 2 (two) times daily.   . VENCLEXTA 100 MG TABS  09/11/2017: Per Dr. Florene Glen stop 09/09/17 until further notice   No facility-administered encounter medications on file as of 02/11/2018.     ALLERGIES:  No Known Allergies   PHYSICAL EXAM:  ECOG Performance status: 1  Vitals:   02/11/18 1057  BP: (!) 148/52  Pulse: 87  Resp: 16  Temp: 98.4 F (36.9 C)  SpO2: 100%   Filed Weights   02/11/18 1057  Weight: 171 lb 6.4 oz (77.7 kg)    Physical Exam  Constitutional: He is oriented to person, place, and time. He appears well-developed and well-nourished.  Cardiovascular: Normal rate, regular rhythm and normal heart sounds.  Pulmonary/Chest: Effort normal and breath sounds normal.  Neurological: He is alert and oriented  to person, place, and time.  Skin: Skin is warm and dry.  Psychiatric: He has a normal mood and affect. His behavior is normal. Judgment and thought content normal.  Abdomen: Soft nontender with no palpable organomegaly. Extremities: No edema cyanosis.   LABORATORY DATA:  I have reviewed the labs as listed.  CBC    Component Value Date/Time   WBC 1.6 (L) 02/11/2018 1025   RBC 3.48 (L) 02/11/2018 1025   HGB 11.5 (L) 02/11/2018 1025   HGB 6.7 (LL) 06/23/2016 0825   HCT 36.2 (L) 02/11/2018 1025  HCT 20.1 (L) 06/23/2016 0825   PLT 125 (L) 02/11/2018 1025   PLT 182 06/23/2016 0825   MCV 104.0 (H) 02/11/2018 1025   MCV 106 (H) 06/23/2016 0825   MCH 33.0 02/11/2018 1025   MCHC 31.8 02/11/2018 1025   RDW 16.4 (H) 02/11/2018 1025   RDW 16.4 (H) 06/23/2016 0825   LYMPHSABS 0.5 (L) 02/11/2018 1025   LYMPHSABS 0.9 06/23/2016 0825   MONOABS 0.1 02/11/2018 1025   EOSABS 0.0 02/11/2018 1025   EOSABS 0.0 06/23/2016 0825   BASOSABS 0.0 02/11/2018 1025   BASOSABS 0.0 06/23/2016 0825   CMP Latest Ref Rng & Units 02/11/2018 02/04/2018 01/28/2018  Glucose 70 - 99 mg/dL 97 115(H) 119(H)  BUN 8 - 23 mg/dL 12 16 17   Creatinine 0.61 - 1.24 mg/dL 0.98 1.06 1.18  Sodium 135 - 145 mmol/L 137 136 136  Potassium 3.5 - 5.1 mmol/L 3.8 3.9 3.7  Chloride 98 - 111 mmol/L 102 101 102  CO2 22 - 32 mmol/L 26 29 27   Calcium 8.9 - 10.3 mg/dL 8.8(L) 9.3 9.2  Total Protein 6.5 - 8.1 g/dL 6.6 7.0 6.8  Total Bilirubin 0.3 - 1.2 mg/dL 0.9 1.0 1.0  Alkaline Phos 38 - 126 U/L 62 68 69  AST 15 - 41 U/L 23 26 33  ALT 0 - 44 U/L 28 39 55(H)        ASSESSMENT & PLAN:   AML (acute myeloblastic leukemia) ([facility]) 1.  AML with myelodysplasia related changes:  - Cycle 1 of decitabine 20 mg/m2 IV x5 days, along with venetoclax 400 mg daily started on 07/13/2017 - bone marrow on 08/07/2017 shows decrease in bone marrow blasts to around 36%.  Initial biopsy showed 86% blasts. - Cycle 2 decitabine on 08/12/2017,  requiring weekly transfusions.  He had bone marrow biopsy done on 09/04/2017 which shows hypocellular with no excess blasts. - Cycle 3 of decitabine (20 mg per M square) every 6 weeks was given on 09/24/2017. -Cycle 4 of decitabine 20 mg/m for 5 days was given on 11/05/2017. -Cycle 5 of decitabine without venetoclax on 12/31/2017. - Today he complains of feeling tired.  He reportedly fell 2-3 times while walking up a concrete ramp at his house.  No fevers or infections.  He is continuing prophylactic antibiotics. - I have called and talked to Dr. Florene Glen at [facility].  We will hold his decitabine therapy today. -I will reevaluate him in 1 week.  We will likely continue decitabine without venetoclax.  2.  Access: Port was placed on the left chest wall on 11/02/2017.  3.  ID: Continue acyclovir, Diflucan and Levaquin as ANC is 900.  4.  Low back pain: -Patient to take tramadol 50 mg 2 tablets twice daily which is controlling his pain.      Orders placed this encounter:  No orders of the defined types were placed in this encounter.     Derek Jack, MD [place] [phone]

## 2018-02-11 NOTE — Assessment & Plan Note (Signed)
1.  AML with myelodysplasia related changes:  - Cycle 1 of decitabine 20 mg/m2 IV x5 days, along with venetoclax 400 mg daily started on 07/13/2017 - bone marrow on 08/07/2017 shows decrease in bone marrow blasts to around 36%.  Initial biopsy showed 86% blasts. - Cycle 2 decitabine on 08/12/2017, requiring weekly transfusions.  He had bone marrow biopsy done on 09/04/2017 which shows hypocellular with no excess blasts. - Cycle 3 of decitabine (20 mg per M square) every 6 weeks was given on 09/24/2017. -Cycle 4 of decitabine 20 mg/m for 5 days was given on 11/05/2017. -Cycle 5 of decitabine without venetoclax on 12/31/2017. - Today he complains of feeling tired.  He reportedly fell 2-3 times while walking up a concrete ramp at his house.  No fevers or infections.  He is continuing prophylactic antibiotics. - I have called and talked to Dr. Florene Glen at [facility].  We will hold his decitabine therapy today. -I will reevaluate him in 1 week.  We will likely continue decitabine without venetoclax.  2.  Access: Port was placed on the left chest wall on 11/02/2017.  3.  ID: Continue acyclovir, Diflucan and Levaquin as ANC is 900.  4.  Low back pain: -Patient to take tramadol 50 mg 2 tablets twice daily which is controlling his pain.

## 2018-02-11 NOTE — Progress Notes (Signed)
1220 Chemo tx rescheduled for next week due to low WBC and ANC per Dr. Delton Coombes. Pt seen by Dr. Delton Coombes today and labs were discussed with Dr. Florene Glen from [facility] and pt was held this week per Dr. Delton Coombes

## 2018-02-12 ENCOUNTER — Ambulatory Visit (HOSPITAL_COMMUNITY): Payer: Medicare HMO

## 2018-02-13 ENCOUNTER — Ambulatory Visit (HOSPITAL_COMMUNITY): Payer: Medicare HMO

## 2018-02-14 ENCOUNTER — Other Ambulatory Visit (HOSPITAL_COMMUNITY): Payer: Medicare HMO

## 2018-02-14 ENCOUNTER — Ambulatory Visit (HOSPITAL_COMMUNITY): Payer: Medicare HMO

## 2018-02-15 ENCOUNTER — Other Ambulatory Visit (HOSPITAL_COMMUNITY): Payer: Self-pay

## 2018-02-15 ENCOUNTER — Encounter (HOSPITAL_COMMUNITY): Payer: Medicare HMO

## 2018-02-15 ENCOUNTER — Ambulatory Visit (HOSPITAL_COMMUNITY): Payer: Medicare HMO

## 2018-02-15 DIAGNOSIS — C9200 Acute myeloblastic leukemia, not having achieved remission: Secondary | ICD-10-CM

## 2018-02-18 ENCOUNTER — Other Ambulatory Visit (HOSPITAL_COMMUNITY): Payer: Self-pay | Admitting: *Deleted

## 2018-02-18 ENCOUNTER — Inpatient Hospital Stay (HOSPITAL_COMMUNITY): Payer: Medicare HMO | Attending: Hematology

## 2018-02-18 ENCOUNTER — Encounter (HOSPITAL_COMMUNITY): Payer: Self-pay | Admitting: Hematology

## 2018-02-18 ENCOUNTER — Other Ambulatory Visit: Payer: Self-pay

## 2018-02-18 ENCOUNTER — Inpatient Hospital Stay (HOSPITAL_COMMUNITY): Payer: Medicare HMO | Attending: Hematology | Admitting: Hematology

## 2018-02-18 ENCOUNTER — Inpatient Hospital Stay (HOSPITAL_COMMUNITY): Payer: Medicare HMO

## 2018-02-18 VITALS — BP 118/69 | HR 73 | Temp 97.5°F | Resp 18 | Wt 170.4 lb

## 2018-02-18 DIAGNOSIS — K5900 Constipation, unspecified: Secondary | ICD-10-CM | POA: Insufficient documentation

## 2018-02-18 DIAGNOSIS — Z5111 Encounter for antineoplastic chemotherapy: Secondary | ICD-10-CM | POA: Insufficient documentation

## 2018-02-18 DIAGNOSIS — C9200 Acute myeloblastic leukemia, not having achieved remission: Secondary | ICD-10-CM

## 2018-02-18 DIAGNOSIS — M545 Low back pain: Secondary | ICD-10-CM | POA: Diagnosis not present

## 2018-02-18 DIAGNOSIS — R5383 Other fatigue: Secondary | ICD-10-CM | POA: Insufficient documentation

## 2018-02-18 DIAGNOSIS — Z79899 Other long term (current) drug therapy: Secondary | ICD-10-CM | POA: Insufficient documentation

## 2018-02-18 DIAGNOSIS — R42 Dizziness and giddiness: Secondary | ICD-10-CM

## 2018-02-18 LAB — CBC WITH DIFFERENTIAL/PLATELET
Abs Immature Granulocytes: 0.11 10*3/uL — ABNORMAL HIGH (ref 0.00–0.07)
BASOS ABS: 0 10*3/uL (ref 0.0–0.1)
Basophils Relative: 0 %
Eosinophils Absolute: 0 10*3/uL (ref 0.0–0.5)
Eosinophils Relative: 0 %
HCT: 36.8 % — ABNORMAL LOW (ref 39.0–52.0)
HEMOGLOBIN: 11.8 g/dL — AB (ref 13.0–17.0)
IMMATURE GRANULOCYTES: 4 %
LYMPHS ABS: 0.6 10*3/uL — AB (ref 0.7–4.0)
LYMPHS PCT: 24 %
MCH: 33.1 pg (ref 26.0–34.0)
MCHC: 32.1 g/dL (ref 30.0–36.0)
MCV: 103.4 fL — ABNORMAL HIGH (ref 80.0–100.0)
Monocytes Absolute: 0.1 10*3/uL (ref 0.1–1.0)
Monocytes Relative: 5 %
NEUTROS ABS: 1.7 10*3/uL (ref 1.7–7.7)
NEUTROS PCT: 67 %
NRBC: 0 % (ref 0.0–0.2)
Platelets: 112 10*3/uL — ABNORMAL LOW (ref 150–400)
RBC: 3.56 MIL/uL — ABNORMAL LOW (ref 4.22–5.81)
RDW: 16.1 % — AB (ref 11.5–15.5)
WBC: 2.6 10*3/uL — ABNORMAL LOW (ref 4.0–10.5)

## 2018-02-18 LAB — COMPREHENSIVE METABOLIC PANEL
ALT: 32 U/L (ref 0–44)
ANION GAP: 9 (ref 5–15)
AST: 26 U/L (ref 15–41)
Albumin: 4.1 g/dL (ref 3.5–5.0)
Alkaline Phosphatase: 75 U/L (ref 38–126)
BILIRUBIN TOTAL: 0.5 mg/dL (ref 0.3–1.2)
BUN: 21 mg/dL (ref 8–23)
CALCIUM: 8.6 mg/dL — AB (ref 8.9–10.3)
CO2: 27 mmol/L (ref 22–32)
Chloride: 98 mmol/L (ref 98–111)
Creatinine, Ser: 1.18 mg/dL (ref 0.61–1.24)
GFR calc non Af Amer: 57 mL/min — ABNORMAL LOW (ref >60)
Glucose, Bld: 135 mg/dL — ABNORMAL HIGH (ref 70–99)
POTASSIUM: 4.2 mmol/L (ref 3.5–5.1)
SODIUM: 134 mmol/L — AB (ref 135–145)
TOTAL PROTEIN: 6.4 g/dL — AB (ref 6.5–8.1)

## 2018-02-18 LAB — MAGNESIUM: Magnesium: 2.1 mg/dL (ref 1.7–2.4)

## 2018-02-18 LAB — SAMPLE TO BLOOD BANK

## 2018-02-18 MED ORDER — SODIUM CHLORIDE 0.9 % IV SOLN
20.0000 mg/m2 | Freq: Once | INTRAVENOUS | Status: AC
  Administered 2018-02-18: 40 mg via INTRAVENOUS
  Filled 2018-02-18: qty 8

## 2018-02-18 MED ORDER — PROCHLORPERAZINE MALEATE 10 MG PO TABS
10.0000 mg | ORAL_TABLET | Freq: Once | ORAL | Status: AC
  Administered 2018-02-18: 10 mg via ORAL
  Filled 2018-02-18: qty 1

## 2018-02-18 MED ORDER — TRAMADOL HCL 50 MG PO TABS: 100.0000 mg | ORAL_TABLET | Freq: Two times a day (BID) | ORAL | 0 refills | Status: DC

## 2018-02-18 MED ORDER — SODIUM CHLORIDE 0.9 % IV SOLN
Freq: Once | INTRAVENOUS | Status: AC
  Administered 2018-02-18: 15:00:00 via INTRAVENOUS

## 2018-02-18 NOTE — Assessment & Plan Note (Signed)
1.  AML with myelodysplasia related changes:  - Cycle 1 of decitabine 20 mg/m2 IV x5 days, along with venetoclax 400 mg daily started on 07/13/2017 - bone marrow on 08/07/2017 shows decrease in bone marrow blasts to around 36%.  Initial biopsy showed 86% blasts. - Cycle 2 decitabine on 08/12/2017, requiring weekly transfusions.  He had bone marrow biopsy done on 09/04/2017 which shows hypocellular with no excess blasts. - Cycle 3 of decitabine (20 mg per M square) every 6 weeks was given on 09/24/2017. -Cycle 4 of decitabine 20 mg/m for 5 days was given on 11/05/2017. -Cycle 5 of decitabine without venetoclax on 12/31/2017. - I have reviewed his blood counts today.  His white count improved to 2.6 with normal absolute neutrophil count.  He may proceed with cycle 6 decitabine without venetoclax starting today. -We will continue to monitor his blood counts on a weekly basis.  He will come back in 6 weeks for follow-up next cycle.  2.  Access: Port was placed on the left chest wall on 11/02/2017.  3.  ID: We will hold his acyclovir, Diflucan and Levaquin as his [place] 1700 today.   4.  Low back pain: -He takes tramadol 50 mg 2 tablets twice daily.  He ran out of the pills.  We will send in a refill today.

## 2018-02-18 NOTE — Patient Instructions (Signed)
[place] at [facility] Discharge Instructions  Follow up in 6 weeks with labs and treatment.   Thank you for choosing [place] at [facility] to provide your oncology and hematology care.  To afford each patient quality time with our provider, please arrive at least 15 minutes before your scheduled appointment time.   If you have a lab appointment with the [facility] please come in thru the  Main Entrance and check in at the main information desk  You need to re-schedule your appointment should you arrive 10 or more minutes late.  We strive to give you quality time with our providers, and arriving late affects you and other patients whose appointments are after yours.  Also, if you no show three or more times for appointments you may be dismissed from the clinic at the providers discretion.     Again, thank you for choosing [facility].  Our hope is that these requests will decrease the amount of time that you wait before being seen by our physicians.       _____________________________________________________________  Should you have questions after your visit to [facility], please contact our office at [phone] between the hours of 8:00 a.m. and 4:30 p.m.  Voicemails left after 4:00 p.m. will not be returned until the following business day.  For prescription refill requests, have your pharmacy contact our office and allow 72 hours.    Cancer Center Support Programs:   > Cancer Support Group  2nd Tuesday of the month 1pm-2pm, Journey Room

## 2018-02-18 NOTE — Progress Notes (Signed)
Tolerated infusion w/o adverse reaction.  Alert, in no distress.  VSS.  Discharged ambulatory.  

## 2018-02-18 NOTE — Progress Notes (Signed)
Omar Lee, [place] [address]   CLINIC:  Medical Oncology/Hematology  PCP:  [name], [place] [address] [phone]   REASON FOR VISIT: Follow-up for Acute Myeloblastic Leukemia (AML)  CURRENT THERAPY: Decitabine.  BRIEF ONCOLOGIC HISTORY:    AML (acute myeloblastic leukemia) ([place])   07/2016 Initial Diagnosis    MDS- BM BX with 8% blasts.  Cytogenetics with minus Y only.  IPSS-R 3.5, intermediate risk (0 for cyto, 2.0 for blasts, 1.5 for Hb, 0 for platelets, 0 for ANC).    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  FLT3-ITD/TKD negative.  AML panel positive for NRAS,STAG2,TET2, U2AF1, DNMT3A    07/13/2017 -  Chemotherapy    C1 decitabine 20 mg/m2 IV daily x5 days with venetoclax 400 mg daily.  Given Hydrea prior to starting when it collects due to leukocytosis.      INTERVAL HISTORY:  Mr. Omar Lee 77 y.o. male returns for routine follow-up for Acute Myeloblastic Leukemia. He is experiencing fatigue and dizziness throughout the day. He also has constipation. He used magnesium citrate recently with relief. He denies any nausea, vomiting, or diarrhea. Denies any new pain. Denies any bleeding or dark stools. Denies any skin rashes. He reports his appetite at and energy levels are low. He lives at home and remains active and performs all his own ADLs.     REVIEW OF SYSTEMS:  Review of Systems  Constitutional: Positive for fatigue.  Gastrointestinal: Positive for constipation.  Neurological: Positive for dizziness.  Hematological: Bruises/bleeds easily.  All other systems reviewed and are negative.    PAST MEDICAL/SURGICAL HISTORY:    Past Medical History:  Diagnosis Date  . Cancer ([place])    MDS  . Hyperlipidemia   . Hypothyroidism   . Reflux   . Sleep apnea    Past Surgical History:  Procedure Laterality Date  . ESOPHAGOGASTRODUODENOSCOPY (EGD) WITH ESOPHAGEAL DILATION N/A 11/22/2012   Procedure: ESOPHAGOGASTRODUODENOSCOPY (EGD) WITH ESOPHAGEAL DILATION;  Surgeon: Rogene Houston, MD;  Location: [facility] SUITE;  Service: Endoscopy;  Laterality: N/A;  1120  . HEMORRHOID SURGERY    . NASAL SEPTUM SURGERY    . PORTACATH PLACEMENT Left 11/02/2017   Procedure: INSERTION PORT WITH ATTACHED CATHETER LEFT SUBCLAVIAN;  Surgeon: Aviva Signs, MD;  Location: [facility] ORS;  Service: General;  Laterality: Left;  . SHOULDER SURGERY Right   . TONSILLECTOMY AND ADENOIDECTOMY       SOCIAL HISTORY:  Social History   Socioeconomic History  . Marital status: Divorced    Spouse name: Not on file  . Number of children: Not on file  . Years of education: Not on file  . Highest education level: Not on file  Occupational History  . Not on file  Social Needs  . Financial resource strain: Not on file  . Food insecurity:    Worry: Not on file    Inability: Not on file  . Transportation needs:    Medical: Not on file    Non-medical: Not on file  Tobacco Use  . Smoking status: Never Smoker  . Smokeless tobacco: Never Used  Substance and Sexual Activity  . Alcohol use: No  .  Drug use: No  . Sexual activity: Not Currently    Birth control/protection: None  Lifestyle  . Physical activity:    Days per week: Not on file    Minutes per session: Not on file  . Stress: Not on file  Relationships  . Social connections:    Talks on phone: Not on file    Gets together: Not on file    Attends religious service: Not on file    Active member of club or organization: Not on file    Attends meetings of clubs or organizations: Not on file    Relationship status: Not on file  . Intimate partner violence:    Fear of current or ex partner:  Not on file    Emotionally abused: Not on file    Physically abused: Not on file    Forced sexual activity: Not on file  Other Topics Concern  . Not on file  Social History Narrative  . Not on file    FAMILY HISTORY:  Family History  Problem Relation Age of Onset  . Hypertension Mother   . Heart attack Mother   . Heart attack Father   . Diabetes Brother     CURRENT MEDICATIONS:  Outpatient Encounter Medications as of 02/18/2018  Medication Sig Note  . acetaminophen (TYLENOL) 325 MG tablet Take 650 mg by mouth as needed.   Marland Kitchen allopurinol (ZYLOPRIM) 300 MG tablet Take 1 tablet (300 mg total) by mouth daily.   . fluconazole (DIFLUCAN) 200 MG tablet TK 1 T PO QD WHILE  NEUTROPENIC   . levofloxacin (LEVAQUIN) 500 MG tablet TK 1 T PO QD WHILE NEUTROPENIC   . levothyroxine (SYNTHROID, LEVOTHROID) 125 MCG tablet TAKE 1 TABLET DAILY (NEED OFFICE VISIT)   . mirtazapine (REMERON) 7.5 MG tablet Take by mouth.   . mirtazapine (REMERON) 7.5 MG tablet    . pantoprazole (PROTONIX) 40 MG tablet    . traMADol (ULTRAM) 50 MG tablet Take 2 tablets (100 mg total) by mouth 2 (two) times daily.   . VENCLEXTA 100 MG TABS  09/11/2017: Per Dr. Florene Glen stop 09/09/17 until further notice  . [DISCONTINUED] traMADol (ULTRAM) 50 MG tablet Take 2 tablets (100 mg total) by mouth 2 (two) times daily.   . [DISCONTINUED] traMADol (ULTRAM) 50 MG tablet Take by mouth.    Facility-Administered Encounter Medications as of 02/18/2018  Medication  . [COMPLETED] 0.9 %  sodium chloride infusion  . decitabine (DACOGEN) 40 mg in sodium chloride 0.9 % 50 mL chemo infusion  . [COMPLETED] prochlorperazine (COMPAZINE) tablet 10 mg    ALLERGIES:  No Known Allergies   PHYSICAL EXAM:  ECOG Performance status: 1  VITAL SIGNS: BP:136/60, P:77, R:16, T;97.8, SATS: 99% Weight:170.4  Physical Exam  Constitutional: He is oriented to person, place, and time. He appears well-developed and well-nourished.  Musculoskeletal: Normal  range of motion.  Neurological: He is alert and oriented to person, place, and time.  Skin: Skin is warm and dry.  Psychiatric: He has a normal mood and affect. His behavior is normal. Judgment and thought content normal.     LABORATORY DATA:  I have reviewed the labs as listed.  CBC    Component Value Date/Time   WBC 2.6 (L) 02/18/2018 1301   RBC 3.56 (L) 02/18/2018 1301   HGB 11.8 (L) 02/18/2018 1301   HGB 6.7 (LL) 06/23/2016 0825   HCT 36.8 (L) 02/18/2018 1301   HCT 20.1 (L) 06/23/2016 0825   PLT 112 (L) 02/18/2018  1301   PLT 182 06/23/2016 0825   MCV 103.4 (H) 02/18/2018 1301   MCV 106 (H) 06/23/2016 0825   MCH 33.1 02/18/2018 1301   MCHC 32.1 02/18/2018 1301   RDW 16.1 (H) 02/18/2018 1301   RDW 16.4 (H) 06/23/2016 0825   LYMPHSABS 0.6 (L) 02/18/2018 1301   LYMPHSABS 0.9 06/23/2016 0825   MONOABS 0.1 02/18/2018 1301   EOSABS 0.0 02/18/2018 1301   EOSABS 0.0 06/23/2016 0825   BASOSABS 0.0 02/18/2018 1301   BASOSABS 0.0 06/23/2016 0825   CMP Latest Ref Rng & Units 02/18/2018 02/11/2018 02/04/2018  Glucose 70 - 99 mg/dL 135(H) 97 115(H)  BUN 8 - 23 mg/dL _0 Creatinine 0.61 - 1.24 mg/dL 1.18 0.98 1.06  Sodium 135 - 145 mmol/L 134(L) 137 136  Potassium 3.5 - 5.1 mmol/L 4.2 3.8 3.9  Chloride 98 - 111 mmol/L 98 102 101  CO2 22 - 32 mmol/L _1 Calcium 8.9 - 10.3 mg/dL 8.6(L) 8.8(L) 9.3  Total Protein 6.5 - 8.1 g/dL 6.4(L) 6.6 7.0  Total Bilirubin 0.3 - 1.2 mg/dL 0.5 0.9 1.0  Alkaline Phos 38 - 126 U/L 75 62 68  AST 15 - 41 U/L _2 ALT 0 - 44 U/L 32 28 39         ASSESSMENT & PLAN:   AML (acute myeloblastic leukemia) ([facility]) 1.  AML with myelodysplasia related changes:  - Cycle 1 of decitabine 20 mg/m2 IV x5 days, along with venetoclax 400 mg daily started on 07/13/2017 - bone marrow on 08/07/2017 shows decrease in bone marrow blasts to around 36%.  Initial biopsy showed 86% blasts. - Cycle 2 decitabine on 08/12/2017, requiring weekly transfusions.   He had bone marrow biopsy done on 09/04/2017 which shows hypocellular with no excess blasts. - Cycle 3 of decitabine (20 mg per M square) every 6 weeks was given on 09/24/2017. -Cycle 4 of decitabine 20 mg/m for 5 days was given on 11/05/2017. -Cycle 5 of decitabine without venetoclax on 12/31/2017. - I have reviewed his blood counts today.  His white count improved to 2.6 with normal absolute neutrophil count.  He may proceed with cycle 6 decitabine without venetoclax starting today. -We will continue to monitor his blood counts on a weekly basis.  He will come back in 6 weeks for follow-up next cycle.  2.  Access: Port was placed on the left chest wall on 11/02/2017.  3.  ID: We will hold his acyclovir, Diflucan and Levaquin as his [place] [address] today.   4.  Low back pain: -He takes tramadol 50 mg 2 tablets twice daily.  He ran out of the pills.  We will send in a refill today.      Orders placed this encounter:  Orders Placed This Encounter  Procedures  . Magnesium  . CBC with Differential/Platelet  . Comprehensive metabolic panel      Derek Jack, MD [place] [phone]

## 2018-02-19 ENCOUNTER — Inpatient Hospital Stay (HOSPITAL_COMMUNITY): Payer: Medicare HMO

## 2018-02-19 VITALS — BP 139/68 | HR 74 | Temp 97.8°F | Resp 18

## 2018-02-19 DIAGNOSIS — C9200 Acute myeloblastic leukemia, not having achieved remission: Secondary | ICD-10-CM

## 2018-02-19 DIAGNOSIS — Z5111 Encounter for antineoplastic chemotherapy: Secondary | ICD-10-CM | POA: Diagnosis not present

## 2018-02-19 MED ORDER — HEPARIN SOD (PORK) LOCK FLUSH 100 UNIT/ML IV SOLN: 500.0000 [IU] | Freq: Once | INTRAVENOUS | Status: DC | PRN

## 2018-02-19 MED ORDER — SODIUM CHLORIDE 0.9% FLUSH
10.0000 mL | INTRAVENOUS | Status: DC | PRN
  Administered 2018-02-19: 10 mL
  Filled 2018-02-19: qty 10

## 2018-02-19 MED ORDER — SODIUM CHLORIDE 0.9 % IV SOLN
20.0000 mg/m2 | Freq: Once | INTRAVENOUS | Status: AC
  Administered 2018-02-19: 40 mg via INTRAVENOUS
  Filled 2018-02-19: qty 8

## 2018-02-19 MED ORDER — HEPARIN SOD (PORK) LOCK FLUSH 100 UNIT/ML IV SOLN
INTRAVENOUS | Status: AC
  Filled 2018-02-19: qty 5

## 2018-02-19 MED ORDER — PROCHLORPERAZINE MALEATE 10 MG PO TABS
ORAL_TABLET | ORAL | Status: AC
  Filled 2018-02-19: qty 1

## 2018-02-19 MED ORDER — SODIUM CHLORIDE 0.9 % IV SOLN
Freq: Once | INTRAVENOUS | Status: AC
  Administered 2018-02-19: 13:00:00 via INTRAVENOUS

## 2018-02-19 MED ORDER — PROCHLORPERAZINE MALEATE 10 MG PO TABS
10.0000 mg | ORAL_TABLET | Freq: Once | ORAL | Status: AC
  Administered 2018-02-19: 10 mg via ORAL

## 2018-02-19 NOTE — Patient Instructions (Signed)
[facility] Discharge Instructions for Patients Receiving Chemotherapy  Today you received the following chemotherapy agents   To help prevent nausea and vomiting after your treatment, we encourage you to take your nausea medication   If you develop nausea and vomiting that is not controlled by your nausea medication, call the clinic.   BELOW ARE SYMPTOMS THAT SHOULD BE REPORTED IMMEDIATELY:  *FEVER GREATER THAN 100.5 F  *CHILLS WITH OR WITHOUT FEVER  NAUSEA AND VOMITING THAT IS NOT CONTROLLED WITH YOUR NAUSEA MEDICATION  *UNUSUAL SHORTNESS OF BREATH  *UNUSUAL BRUISING OR BLEEDING  TENDERNESS IN MOUTH AND THROAT WITH OR WITHOUT PRESENCE OF ULCERS  *URINARY PROBLEMS  *BOWEL PROBLEMS  UNUSUAL RASH Items with * indicate a potential emergency and should be followed up as soon as possible.  Feel free to call the clinic should you have any questions or concerns. The clinic phone number is [phone].  Please show the CHEMO ALERT CARD at check-in to the Emergency Department and triage nurse.   

## 2018-02-19 NOTE — Progress Notes (Signed)
Pt presents to the [place] ambulatory for Day 2 Cycle 6 Decitabine. VSS. Pt has complaints of dressing himself . Social worker at the bedside assessing patient.   Treatment given today per MD orders. Tolerated infusion without adverse affects. Vital signs stable. No complaints at this time. Discharged from clinic ambulatory. F/U with [facility] as scheduled.

## 2018-02-20 ENCOUNTER — Inpatient Hospital Stay (HOSPITAL_COMMUNITY): Payer: Medicare HMO

## 2018-02-20 DIAGNOSIS — Z5111 Encounter for antineoplastic chemotherapy: Secondary | ICD-10-CM | POA: Diagnosis not present

## 2018-02-20 DIAGNOSIS — C9200 Acute myeloblastic leukemia, not having achieved remission: Secondary | ICD-10-CM

## 2018-02-20 MED ORDER — PROCHLORPERAZINE MALEATE 10 MG PO TABS
10.0000 mg | ORAL_TABLET | Freq: Once | ORAL | Status: AC
  Administered 2018-02-20: 10 mg via ORAL

## 2018-02-20 MED ORDER — SODIUM CHLORIDE 0.9 % IV SOLN
20.0000 mg/m2 | Freq: Once | INTRAVENOUS | Status: AC
  Administered 2018-02-20: 40 mg via INTRAVENOUS
  Filled 2018-02-20: qty 8

## 2018-02-20 MED ORDER — PROCHLORPERAZINE MALEATE 10 MG PO TABS
ORAL_TABLET | ORAL | Status: AC
  Filled 2018-02-20: qty 1

## 2018-02-20 MED ORDER — HEPARIN SOD (PORK) LOCK FLUSH 100 UNIT/ML IV SOLN: 500.0000 [IU] | Freq: Once | INTRAVENOUS | Status: DC | PRN

## 2018-02-20 MED ORDER — SODIUM CHLORIDE 0.9 % IV SOLN
INTRAVENOUS | Status: DC
  Administered 2018-02-20: 14:00:00 via INTRAVENOUS

## 2018-02-20 MED ORDER — SODIUM CHLORIDE 0.9% FLUSH
10.0000 mL | INTRAVENOUS | Status: DC | PRN
  Administered 2018-02-20: 10 mL
  Filled 2018-02-20: qty 10

## 2018-02-20 NOTE — Patient Instructions (Signed)
[facility] Discharge Instructions for Patients Receiving Chemotherapy   Beginning January 23rd 2017 lab work for the Cancer Center will be done in the  Main lab at  on 1st floor. If you have a lab appointment with the Cancer Center please come in thru the  Main Entrance and check in at the main information desk   Today you received the following chemotherapy agents   To help prevent nausea and vomiting after your treatment, we encourage you to take your nausea medication     If you develop nausea and vomiting, or diarrhea that is not controlled by your medication, call the clinic.  The clinic phone number is [phone]. Office hours are Monday-Friday 8:30am-5:00pm.  BELOW ARE SYMPTOMS THAT SHOULD BE REPORTED IMMEDIATELY:  *FEVER GREATER THAN 101.0 F  *CHILLS WITH OR WITHOUT FEVER  NAUSEA AND VOMITING THAT IS NOT CONTROLLED WITH YOUR NAUSEA MEDICATION  *UNUSUAL SHORTNESS OF BREATH  *UNUSUAL BRUISING OR BLEEDING  TENDERNESS IN MOUTH AND THROAT WITH OR WITHOUT PRESENCE OF ULCERS  *URINARY PROBLEMS  *BOWEL PROBLEMS  UNUSUAL RASH Items with * indicate a potential emergency and should be followed up as soon as possible. If you have an emergency after office hours please contact your primary care physician or go to the nearest emergency department.  Please call the clinic during office hours if you have any questions or concerns.   You may also contact the Patient Navigator at [phone] should you have any questions or need assistance in obtaining follow up care.      Resources For Cancer Patients and their Caregivers ? American Cancer Society: Can assist with transportation, wigs, general needs, runs Look Good Feel Better.        1-888-227-6333 ? Cancer Care: Provides financial assistance, online support groups, medication/co-pay assistance.  1-800-813-HOPE (4673) ? [facility] Assists [place] cancer  patients and their families through emotional , educational and financial support.  336-427-4357 ? [place] DSS Where to apply for food stamps, Medicaid and utility assistance. 336-342-1394 ? [facility]: Transportation to medical appointments. 336-347-2287 ? Social Security Administration: May apply for disability if have a Stage IV cancer. 336-342-7796 1-800-772-1213 ? [place] Aging, Disability and Transit Services: Assists with nutrition, care and transit needs. 336-349-2343         

## 2018-02-20 NOTE — Progress Notes (Signed)
Treatment given per orders. Patient tolerated it well without problems. Vitals stable and discharged home from clinic ambulatory. Follow up as scheduled.  

## 2018-02-20 NOTE — Progress Notes (Signed)
Pt presents today for Decitabine. VSS. No complaints of pain. Pt states that he fell earlier today. No dizziness noted or pain per patient.

## 2018-02-21 ENCOUNTER — Inpatient Hospital Stay (HOSPITAL_COMMUNITY): Payer: Medicare HMO

## 2018-02-21 VITALS — BP 130/70 | HR 86 | Temp 98.0°F | Resp 18 | Wt 171.4 lb

## 2018-02-21 DIAGNOSIS — Z5111 Encounter for antineoplastic chemotherapy: Secondary | ICD-10-CM | POA: Diagnosis not present

## 2018-02-21 DIAGNOSIS — C9200 Acute myeloblastic leukemia, not having achieved remission: Secondary | ICD-10-CM

## 2018-02-21 LAB — COMPREHENSIVE METABOLIC PANEL
ALBUMIN: 4.4 g/dL (ref 3.5–5.0)
ALT: 38 U/L (ref 0–44)
ANION GAP: 8 (ref 5–15)
AST: 28 U/L (ref 15–41)
Alkaline Phosphatase: 76 U/L (ref 38–126)
BUN: 17 mg/dL (ref 8–23)
CO2: 25 mmol/L (ref 22–32)
Calcium: 9 mg/dL (ref 8.9–10.3)
Chloride: 101 mmol/L (ref 98–111)
Creatinine, Ser: 1.01 mg/dL (ref 0.61–1.24)
GFR calc non Af Amer: >60 mL/min (ref >60)
GLUCOSE: 102 mg/dL — AB (ref 70–99)
POTASSIUM: 4.2 mmol/L (ref 3.5–5.1)
Sodium: 134 mmol/L — ABNORMAL LOW (ref 135–145)
TOTAL PROTEIN: 6.7 g/dL (ref 6.5–8.1)
Total Bilirubin: 0.8 mg/dL (ref 0.3–1.2)

## 2018-02-21 LAB — CBC WITH DIFFERENTIAL/PLATELET
Abs Immature Granulocytes: 0.1 10*3/uL — ABNORMAL HIGH (ref 0.00–0.07)
BASOS ABS: 0 10*3/uL (ref 0.0–0.1)
Basophils Relative: 0 %
EOS ABS: 0 10*3/uL (ref 0.0–0.5)
EOS PCT: 0 %
HEMATOCRIT: 36.2 % — AB (ref 39.0–52.0)
Hemoglobin: 12.2 g/dL — ABNORMAL LOW (ref 13.0–17.0)
Immature Granulocytes: 4 %
LYMPHS ABS: 0.7 10*3/uL (ref 0.7–4.0)
Lymphocytes Relative: 28 %
MCH: 34.7 pg — AB (ref 26.0–34.0)
MCHC: 33.7 g/dL (ref 30.0–36.0)
MCV: 102.8 fL — ABNORMAL HIGH (ref 80.0–100.0)
MONO ABS: 0.1 10*3/uL (ref 0.1–1.0)
Monocytes Relative: 5 %
Neutro Abs: 1.6 10*3/uL — ABNORMAL LOW (ref 1.7–7.7)
Neutrophils Relative %: 63 %
Platelets: 103 10*3/uL — ABNORMAL LOW (ref 150–400)
RBC: 3.52 MIL/uL — AB (ref 4.22–5.81)
RDW: 16 % — AB (ref 11.5–15.5)
WBC: 2.6 10*3/uL — AB (ref 4.0–10.5)
nRBC: 0 % (ref 0.0–0.2)

## 2018-02-21 LAB — SAMPLE TO BLOOD BANK

## 2018-02-21 LAB — MAGNESIUM: MAGNESIUM: 2.4 mg/dL (ref 1.7–2.4)

## 2018-02-21 MED ORDER — SODIUM CHLORIDE 0.9 % IV SOLN
Freq: Once | INTRAVENOUS | Status: AC
  Administered 2018-02-21: 14:00:00 via INTRAVENOUS

## 2018-02-21 MED ORDER — PROCHLORPERAZINE MALEATE 10 MG PO TABS
10.0000 mg | ORAL_TABLET | Freq: Once | ORAL | Status: AC
  Administered 2018-02-21: 10 mg via ORAL
  Filled 2018-02-21: qty 1

## 2018-02-21 MED ORDER — SODIUM CHLORIDE 0.9 % IV SOLN
20.0000 mg/m2 | Freq: Once | INTRAVENOUS | Status: AC
  Administered 2018-02-21: 40 mg via INTRAVENOUS
  Filled 2018-02-21: qty 8

## 2018-02-21 NOTE — Progress Notes (Signed)
Tolerated infusion w/o adverse reaction.  Alert, in no distress.  VSS.  Discharged ambulatory.  

## 2018-02-22 ENCOUNTER — Encounter (HOSPITAL_COMMUNITY): Payer: Self-pay

## 2018-02-22 ENCOUNTER — Inpatient Hospital Stay (HOSPITAL_COMMUNITY): Payer: Medicare HMO

## 2018-02-22 ENCOUNTER — Ambulatory Visit: Payer: Medicare HMO | Admitting: Family Medicine

## 2018-02-22 VITALS — BP 130/64 | HR 71 | Temp 98.0°F | Resp 18

## 2018-02-22 DIAGNOSIS — Z5111 Encounter for antineoplastic chemotherapy: Secondary | ICD-10-CM | POA: Diagnosis not present

## 2018-02-22 DIAGNOSIS — C9200 Acute myeloblastic leukemia, not having achieved remission: Secondary | ICD-10-CM | POA: Diagnosis not present

## 2018-02-22 MED ORDER — SODIUM CHLORIDE 0.9 % IV SOLN
Freq: Once | INTRAVENOUS | Status: AC
  Administered 2018-02-22: 13:00:00 via INTRAVENOUS

## 2018-02-22 MED ORDER — PROCHLORPERAZINE MALEATE 10 MG PO TABS
10.0000 mg | ORAL_TABLET | Freq: Once | ORAL | Status: AC
  Administered 2018-02-22: 10 mg via ORAL

## 2018-02-22 MED ORDER — HEPARIN SOD (PORK) LOCK FLUSH 100 UNIT/ML IV SOLN
500.0000 [IU] | Freq: Once | INTRAVENOUS | Status: AC | PRN
  Administered 2018-02-22: 500 [IU]

## 2018-02-22 MED ORDER — SODIUM CHLORIDE 0.9 % IV SOLN
20.0000 mg/m2 | Freq: Once | INTRAVENOUS | Status: AC
  Administered 2018-02-22: 40 mg via INTRAVENOUS
  Filled 2018-02-22: qty 8

## 2018-02-22 MED ORDER — PROCHLORPERAZINE MALEATE 10 MG PO TABS
ORAL_TABLET | ORAL | Status: AC
  Filled 2018-02-22: qty 1

## 2018-02-22 MED ORDER — SODIUM CHLORIDE 0.9% FLUSH
10.0000 mL | INTRAVENOUS | Status: DC | PRN
  Administered 2018-02-22: 10 mL
  Filled 2018-02-22: qty 10

## 2018-02-22 NOTE — Progress Notes (Signed)
Patient tolerated chemotherapy with no complaints voiced.  Port site clean and dry with no bruising or swelling noted at site.  Good blood return noted before and after administration of therapy.  Band aid applied.  VSS with discharge and left ambulatory with no s/s of distress noted.

## 2018-02-22 NOTE — Patient Instructions (Signed)
[facility] Discharge Instructions for Patients Receiving Chemotherapy  Today you received the following chemotherapy agents dacogen.    If you develop nausea and vomiting that is not controlled by your nausea medication, call the clinic.   BELOW ARE SYMPTOMS THAT SHOULD BE REPORTED IMMEDIATELY:  *FEVER GREATER THAN 100.5 F  *CHILLS WITH OR WITHOUT FEVER  NAUSEA AND VOMITING THAT IS NOT CONTROLLED WITH YOUR NAUSEA MEDICATION  *UNUSUAL SHORTNESS OF BREATH  *UNUSUAL BRUISING OR BLEEDING  TENDERNESS IN MOUTH AND THROAT WITH OR WITHOUT PRESENCE OF ULCERS  *URINARY PROBLEMS  *BOWEL PROBLEMS  UNUSUAL RASH Items with * indicate a potential emergency and should be followed up as soon as possible.  Feel free to call the clinic should you have any questions or concerns. The clinic phone number is [phone].  Please show the [facility] at check-in to the Emergency Department and triage nurse.

## 2018-02-22 NOTE — Progress Notes (Signed)
Nutrition Follow-up:  Patient with AML with myelodysplasia, followed at [facility].  Met with patient in infusion.  Patient reports appetite is better.  Ate 3 breakfast burritos this am (egg, bacon, potato, cheese).  Also has beef brisket for lunch with orange.  Reports still not eating as much as normal.     Medications: reviewed  Labs: reviewed  Anthropometrics:   Weight increased to 171 lb 6.4 oz stable from 172 lb on 9/20   NUTRITION DIAGNOSIS: Inadequate oral intake   INTERVENTION:  Encouraged patient to continue eating good sources of protein, high calorie foods.     MONITORING, EVALUATION, GOAL: Patient will tolerate adequate calories and protein to maintain weight   NEXT VISIT: December 20 during infusion  Shawana Knoch B. Zenia Resides, [place], [place] Registered Dietitian [phone] (pager)

## 2018-02-27 ENCOUNTER — Encounter: Payer: Self-pay | Admitting: Family Medicine

## 2018-02-27 ENCOUNTER — Ambulatory Visit (INDEPENDENT_AMBULATORY_CARE_PROVIDER_SITE_OTHER): Payer: Medicare HMO | Admitting: Family Medicine

## 2018-02-27 VITALS — Ht 67.0 in | Wt 175.4 lb

## 2018-02-27 DIAGNOSIS — R2689 Other abnormalities of gait and mobility: Secondary | ICD-10-CM | POA: Diagnosis not present

## 2018-02-27 DIAGNOSIS — R413 Other amnesia: Secondary | ICD-10-CM

## 2018-02-27 DIAGNOSIS — E039 Hypothyroidism, unspecified: Secondary | ICD-10-CM | POA: Diagnosis not present

## 2018-02-27 DIAGNOSIS — R5383 Other fatigue: Secondary | ICD-10-CM | POA: Diagnosis not present

## 2018-02-27 NOTE — Progress Notes (Addendum)
   Subjective:    Patient ID: Omar Lee, male    DOB: [birth year], 77 y.o.   MRN: [id number]  HPI Pt here today due to frequent falls. Pt also having hard time remembering anything new. Pt gets lost going around town, forgets he has Animator, forgets that he has food in the trunk. Pt is paying bills late. Pt can remember his childhood. Pt has stopped cooking due to scared of burning something.  Patient notes progressive unsteadiness.  Has had a couple falls.  More and more forgetful at this time.  Having difficulty remembering activities of daily living.  MMSE equivalent done today.  See results.  Substantial diminishment.  Of note now under active therapy for leukemia.  Caretaker relates much of his confusion occurred when chemotherapy was initiated.   Review of Systems Occasional headaches diminished energy somewhat diminished appetite    Objective:   Physical Exam  Alert active good hydration talkative HEENT normal neck supple lungs clear heart rate and rhythm abdomen soft no focal neurological deficits.  No cerebellar dysfunction noted.  Unsteady gait however      Assessment & Plan:  Impression progressive forgetfulness.  Claims no depression.  Also unsteadiness at times.  Substantial short-term memory loss.  Patient has had a unsteady gait progressive.  Discussion held.  Blood work and CT scan of head is certainly warranted.  Follow-up as scheduled.  Patient now has a very supportive neighbor/friend Don Broach who is helping him  2 unsteady gait.  Patient is at high risk for a fall with high risk for substantial injury.  Also quite weak.  Recommend a 4 wheeled walker with seat

## 2018-02-28 ENCOUNTER — Inpatient Hospital Stay (HOSPITAL_COMMUNITY): Payer: Medicare HMO

## 2018-02-28 ENCOUNTER — Other Ambulatory Visit (HOSPITAL_COMMUNITY): Payer: Self-pay | Admitting: *Deleted

## 2018-02-28 DIAGNOSIS — Z5111 Encounter for antineoplastic chemotherapy: Secondary | ICD-10-CM | POA: Diagnosis not present

## 2018-02-28 DIAGNOSIS — C9200 Acute myeloblastic leukemia, not having achieved remission: Secondary | ICD-10-CM

## 2018-02-28 LAB — CBC WITH DIFFERENTIAL/PLATELET
Abs Immature Granulocytes: 0.13 10*3/uL — ABNORMAL HIGH (ref 0.00–0.07)
BASOS PCT: 1 %
Basophils Absolute: 0 10*3/uL (ref 0.0–0.1)
Eosinophils Absolute: 0 10*3/uL (ref 0.0–0.5)
Eosinophils Relative: 1 %
HCT: 33.4 % — ABNORMAL LOW (ref 39.0–52.0)
HEMOGLOBIN: 10.9 g/dL — AB (ref 13.0–17.0)
IMMATURE GRANULOCYTES: 8 %
LYMPHS PCT: 22 %
Lymphs Abs: 0.4 10*3/uL — ABNORMAL LOW (ref 0.7–4.0)
MCH: 33.7 pg (ref 26.0–34.0)
MCHC: 32.6 g/dL (ref 30.0–36.0)
MCV: 103.4 fL — AB (ref 80.0–100.0)
MONO ABS: 0.1 10*3/uL (ref 0.1–1.0)
MONOS PCT: 5 %
NEUTROS ABS: 1.1 10*3/uL — AB (ref 1.7–7.7)
NEUTROS PCT: 63 %
PLATELETS: 60 10*3/uL — AB (ref 150–400)
RBC: 3.23 MIL/uL — ABNORMAL LOW (ref 4.22–5.81)
RDW: 15.8 % — ABNORMAL HIGH (ref 11.5–15.5)
WBC: 1.7 10*3/uL — ABNORMAL LOW (ref 4.0–10.5)
nRBC: 0 % (ref 0.0–0.2)

## 2018-02-28 LAB — COMPREHENSIVE METABOLIC PANEL
ALK PHOS: 68 U/L (ref 38–126)
ALT: 43 U/L (ref 0–44)
AST: 30 U/L (ref 15–41)
Albumin: 4.2 g/dL (ref 3.5–5.0)
Anion gap: 7 (ref 5–15)
BUN: 15 mg/dL (ref 8–23)
CALCIUM: 9 mg/dL (ref 8.9–10.3)
CHLORIDE: 103 mmol/L (ref 98–111)
CO2: 27 mmol/L (ref 22–32)
Creatinine, Ser: 0.91 mg/dL (ref 0.61–1.24)
Glucose, Bld: 115 mg/dL — ABNORMAL HIGH (ref 70–99)
Potassium: 4.3 mmol/L (ref 3.5–5.1)
SODIUM: 137 mmol/L (ref 135–145)
Total Bilirubin: 0.8 mg/dL (ref 0.3–1.2)
Total Protein: 6.7 g/dL (ref 6.5–8.1)

## 2018-02-28 LAB — TSH: TSH: 5.3 u[IU]/mL — AB (ref 0.450–4.500)

## 2018-02-28 LAB — MAGNESIUM: Magnesium: 2 mg/dL (ref 1.7–2.4)

## 2018-02-28 LAB — SAMPLE TO BLOOD BANK

## 2018-02-28 LAB — FOLATE: FOLATE: 10.2 ng/mL (ref >3.0)

## 2018-02-28 LAB — VITAMIN B12: Vitamin B-12: 589 pg/mL (ref 232–1245)

## 2018-02-28 MED ORDER — MISC. DEVICES MISC: 0 refills | Status: DC

## 2018-02-28 NOTE — Progress Notes (Signed)
Patient reports feeling weak at times when walking and request a walker with seat. Order sent to pharmacy.

## 2018-03-01 ENCOUNTER — Telehealth: Payer: Self-pay | Admitting: Family Medicine

## 2018-03-01 NOTE — Telephone Encounter (Signed)
I called and left a message for the caregiver to r/c. The rx is written awaiting a call back to see if the rx needs to be sent to her or to a particular pharmacy.(Form in Nurses box awaiting a call back from cargiver).

## 2018-03-01 NOTE — Telephone Encounter (Signed)
Pt is needing a script for a walker with a seat and face to face notes for insurance to cover the walker.

## 2018-03-01 NOTE — Telephone Encounter (Signed)
Ok may do 

## 2018-03-04 ENCOUNTER — Inpatient Hospital Stay (HOSPITAL_COMMUNITY): Payer: Medicare HMO

## 2018-03-04 DIAGNOSIS — C9200 Acute myeloblastic leukemia, not having achieved remission: Secondary | ICD-10-CM

## 2018-03-04 DIAGNOSIS — Z5111 Encounter for antineoplastic chemotherapy: Secondary | ICD-10-CM | POA: Diagnosis not present

## 2018-03-04 LAB — COMPREHENSIVE METABOLIC PANEL
ALBUMIN: 4.4 g/dL (ref 3.5–5.0)
ALT: 44 U/L (ref 0–44)
AST: 25 U/L (ref 15–41)
Alkaline Phosphatase: 67 U/L (ref 38–126)
Anion gap: 8 (ref 5–15)
BILIRUBIN TOTAL: 0.6 mg/dL (ref 0.3–1.2)
BUN: 20 mg/dL (ref 8–23)
CO2: 26 mmol/L (ref 22–32)
Calcium: 9 mg/dL (ref 8.9–10.3)
Chloride: 102 mmol/L (ref 98–111)
Creatinine, Ser: 0.95 mg/dL (ref 0.61–1.24)
GFR calc Af Amer: >60 mL/min (ref >60)
GFR calc non Af Amer: >60 mL/min (ref >60)
Glucose, Bld: 102 mg/dL — ABNORMAL HIGH (ref 70–99)
POTASSIUM: 3.9 mmol/L (ref 3.5–5.1)
Sodium: 136 mmol/L (ref 135–145)
Total Protein: 6.8 g/dL (ref 6.5–8.1)

## 2018-03-04 LAB — CBC WITH DIFFERENTIAL/PLATELET
Abs Immature Granulocytes: 0.16 10*3/uL — ABNORMAL HIGH (ref 0.00–0.07)
Basophils Absolute: 0 10*3/uL (ref 0.0–0.1)
Basophils Relative: 0 %
EOS ABS: 0 10*3/uL (ref 0.0–0.5)
Eosinophils Relative: 1 %
HEMATOCRIT: 31.4 % — AB (ref 39.0–52.0)
Hemoglobin: 10.4 g/dL — ABNORMAL LOW (ref 13.0–17.0)
IMMATURE GRANULOCYTES: 12 %
Lymphocytes Relative: 35 %
Lymphs Abs: 0.5 10*3/uL — ABNORMAL LOW (ref 0.7–4.0)
MCH: 34.4 pg — ABNORMAL HIGH (ref 26.0–34.0)
MCHC: 33.1 g/dL (ref 30.0–36.0)
MCV: 104 fL — ABNORMAL HIGH (ref 80.0–100.0)
MONOS PCT: 8 %
Monocytes Absolute: 0.1 10*3/uL (ref 0.1–1.0)
NEUTROS PCT: 44 %
Neutro Abs: 0.6 10*3/uL — ABNORMAL LOW (ref 1.7–7.7)
Platelets: 38 10*3/uL — ABNORMAL LOW (ref 150–400)
RBC: 3.02 MIL/uL — ABNORMAL LOW (ref 4.22–5.81)
RDW: 15.9 % — AB (ref 11.5–15.5)
WBC: 1.3 10*3/uL — CL (ref 4.0–10.5)
nRBC: 0 % (ref 0.0–0.2)

## 2018-03-04 LAB — MAGNESIUM: Magnesium: 1.9 mg/dL (ref 1.7–2.4)

## 2018-03-04 LAB — SAMPLE TO BLOOD BANK

## 2018-03-04 NOTE — Telephone Encounter (Signed)
Omar Lee states she tried to get at [name] and pharm told her he had to go through [facility]. rx given to [facility]. She has [facility] form that needs to be filled out to go with rx along with office visit note and demographics. Omar Lee prefers to pick up equipment instead of them bringing it out. I put that note on rx to call Omar Lee when ready to pick up. rx in [name]'s folder.

## 2018-03-05 NOTE — Progress Notes (Unsigned)
CRITICAL VALUE ALERT  Critical Value:  WBC 1.3   Date & Time Notied:  03/04/18 at 1305  Provider Notified: Dr. Delton Coombes  Orders Received/Actions taken: Labs faxed to Dr. Florene Glen at [facility] hematology/oncology.

## 2018-03-06 ENCOUNTER — Telehealth: Payer: Self-pay | Admitting: Family Medicine

## 2018-03-06 ENCOUNTER — Emergency Department (HOSPITAL_COMMUNITY): Payer: Medicare HMO

## 2018-03-06 ENCOUNTER — Other Ambulatory Visit: Payer: Self-pay

## 2018-03-06 ENCOUNTER — Emergency Department (HOSPITAL_COMMUNITY)
Admission: EM | Admit: 2018-03-06 | Discharge: 2018-03-06 | Disposition: A | Discharge status: Home / Self Care | Payer: Medicare HMO | Attending: Emergency Medicine | Admitting: Emergency Medicine

## 2018-03-06 ENCOUNTER — Encounter (HOSPITAL_COMMUNITY): Payer: Self-pay

## 2018-03-06 DIAGNOSIS — E039 Hypothyroidism, unspecified: Secondary | ICD-10-CM | POA: Insufficient documentation

## 2018-03-06 DIAGNOSIS — R69 Illness, unspecified: Secondary | ICD-10-CM | POA: Diagnosis not present

## 2018-03-06 DIAGNOSIS — R451 Restlessness and agitation: Secondary | ICD-10-CM | POA: Diagnosis not present

## 2018-03-06 DIAGNOSIS — R4182 Altered mental status, unspecified: Secondary | ICD-10-CM | POA: Diagnosis not present

## 2018-03-06 DIAGNOSIS — R410 Disorientation, unspecified: Secondary | ICD-10-CM

## 2018-03-06 DIAGNOSIS — Z79899 Other long term (current) drug therapy: Secondary | ICD-10-CM | POA: Insufficient documentation

## 2018-03-06 NOTE — Telephone Encounter (Signed)
Pt's short memory gotten terribly worse, very paranoid, confused Forgets where he's put things, reasoning is way off  [name] is really worried for pt - pt lives alone  Please advise

## 2018-03-06 NOTE — Telephone Encounter (Signed)
Omar Lee is aware.

## 2018-03-06 NOTE — ED Triage Notes (Signed)
Per family, pt became confused and angry today. At baseline pt has mild confusion but today pt is "not himself"

## 2018-03-06 NOTE — Telephone Encounter (Signed)
Done and order signed

## 2018-03-06 NOTE — Telephone Encounter (Signed)
Please do addendum to last OV note (note must speak to the need of the 4 wheeled walker with a seat)  That OV note must be sent with the order so that [facility] can issue the ordered equipment  Also please sign & date order in green folder in yellow box

## 2018-03-06 NOTE — Telephone Encounter (Signed)
Sent order to Dr. Richardson Landry to sign & do addendum to OV note, see other phone message

## 2018-03-06 NOTE — Telephone Encounter (Signed)
No easy answers from [place], we will see after scan is complete for furthr discussion, in the mean time, if pt deteriorating to point of not safe, teresa may need to explore with the pt moving towards rest home setting if cannot stay safely alone, or if pt has major financial resources, can hire a Actuary
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
## 2018-03-06 NOTE — Telephone Encounter (Signed)
Please advise. Thank you

## 2018-03-06 NOTE — Telephone Encounter (Signed)
Please let me know if I need to do anything. Thanks.

## 2018-03-07 ENCOUNTER — Other Ambulatory Visit (HOSPITAL_COMMUNITY): Payer: Self-pay | Admitting: Hematology

## 2018-03-07 ENCOUNTER — Encounter (HOSPITAL_COMMUNITY): Payer: Self-pay

## 2018-03-07 DIAGNOSIS — C9200 Acute myeloblastic leukemia, not having achieved remission: Secondary | ICD-10-CM

## 2018-03-07 MED ORDER — TRAMADOL HCL 50 MG PO TABS: 100.0000 mg | ORAL_TABLET | Freq: Two times a day (BID) | ORAL | 0 refills | Status: DC

## 2018-03-08 ENCOUNTER — Encounter (HOSPITAL_COMMUNITY): Payer: Self-pay | Admitting: Physical Therapy

## 2018-03-08 ENCOUNTER — Inpatient Hospital Stay (HOSPITAL_COMMUNITY): Payer: Medicare HMO

## 2018-03-08 ENCOUNTER — Ambulatory Visit (HOSPITAL_COMMUNITY): Payer: Medicare HMO | Attending: Family Medicine | Admitting: Physical Therapy

## 2018-03-08 ENCOUNTER — Other Ambulatory Visit: Payer: Self-pay

## 2018-03-08 DIAGNOSIS — C9200 Acute myeloblastic leukemia, not having achieved remission: Secondary | ICD-10-CM | POA: Diagnosis not present

## 2018-03-08 DIAGNOSIS — M6281 Muscle weakness (generalized): Secondary | ICD-10-CM | POA: Diagnosis not present

## 2018-03-08 DIAGNOSIS — Z5111 Encounter for antineoplastic chemotherapy: Secondary | ICD-10-CM | POA: Diagnosis not present

## 2018-03-08 DIAGNOSIS — R2689 Other abnormalities of gait and mobility: Secondary | ICD-10-CM | POA: Diagnosis not present

## 2018-03-08 LAB — CBC WITH DIFFERENTIAL/PLATELET
Abs Immature Granulocytes: 0.13 10*3/uL — ABNORMAL HIGH (ref 0.00–0.07)
Basophils Absolute: 0 10*3/uL (ref 0.0–0.1)
Basophils Relative: 0 %
EOS ABS: 0 10*3/uL (ref 0.0–0.5)
EOS PCT: 0 %
HCT: 30.8 % — ABNORMAL LOW (ref 39.0–52.0)
HEMOGLOBIN: 10.1 g/dL — AB (ref 13.0–17.0)
Immature Granulocytes: 11 %
LYMPHS ABS: 0.3 10*3/uL — AB (ref 0.7–4.0)
Lymphocytes Relative: 23 %
MCH: 33.9 pg (ref 26.0–34.0)
MCHC: 32.8 g/dL (ref 30.0–36.0)
MCV: 103.4 fL — AB (ref 80.0–100.0)
MONO ABS: 0.3 10*3/uL (ref 0.1–1.0)
Monocytes Relative: 30 %
NEUTROS PCT: 36 %
Neutro Abs: 0.4 10*3/uL — ABNORMAL LOW (ref 1.7–7.7)
PLATELETS: 46 10*3/uL — AB (ref 150–400)
RBC: 2.98 MIL/uL — ABNORMAL LOW (ref 4.22–5.81)
RDW: 16.1 % — ABNORMAL HIGH (ref 11.5–15.5)
WBC: 1.2 10*3/uL — CL (ref 4.0–10.5)
nRBC: 1.7 % — ABNORMAL HIGH (ref 0.0–0.2)

## 2018-03-08 LAB — SAMPLE TO BLOOD BANK

## 2018-03-08 NOTE — Therapy (Signed)
[place], [place], [address] Phone: [phone]   Fax:  [phone]  Physical Therapy Evaluation  Patient Details  Name: Omar Lee MRN: [id number] Date of Birth: 1941/02/21 Referring Provider (PT): [name], MD   Encounter Date: 03/08/2018  PT End of Session - 03/08/18 0940    Visit Number  1    Number of Visits  13    Date for PT Re-Evaluation  04/19/17   Mini re-assess 03/29/18   Authorization Type  [employer]    Authorization Time Period  03/08/18 - 04/19/17    Authorization - Visit Number  1    Authorization - Number of Visits  10    PT Start Time  0845    PT Stop Time  0929    PT Time Calculation (min)  44 min    Equipment Utilized During Treatment  Gait belt    Activity Tolerance  Patient tolerated treatment well;Patient limited by fatigue    Behavior During Therapy  [facility] for tasks assessed/performed       Past Medical History:  Diagnosis Date  . 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 Hyperlipidemia   . Hypothyroidism   . Reflux   . Sleep apnea     Past Surgical History:  Procedure Laterality Date  . ESOPHAGOGASTRODUODENOSCOPY (EGD) WITH ESOPHAGEAL DILATION N/A 11/22/2012   Procedure: ESOPHAGOGASTRODUODENOSCOPY (EGD) WITH ESOPHAGEAL DILATION;  Surgeon: Rogene Houston, MD;  [facility];  Service: Endoscopy;  Laterality: N/A;  1120  . HEMORRHOID SURGERY    . NASAL SEPTUM SURGERY    . PORTACATH PLACEMENT Left 11/02/2017   Procedure: INSERTION PORT WITH ATTACHED CATHETER LEFT SUBCLAVIAN;  Surgeon: Aviva Signs, MD;  Location: [facility] ORS;  Service: General;  Laterality: Left;  . SHOULDER SURGERY Right   . TONSILLECTOMY AND ADENOIDECTOMY      There were no vitals filed for this visit.   Subjective Assessment - 03/08/18 0854    Subjective  Patient reported that he has leukemia he stated that he has motor problems and memory problems. Patient stated he has had difficulty with walking  for a while now and that he has had a lot of problems the last year with his walking. Patient reported that he has a cane, but that he doesn't have it with him. Patient reported that he has someone who comes to his home at times to help him with things when he needs them.     Pertinent History  Active Leukemia    Limitations  Sitting;Standing;Walking;House hold activities    How long can you sit comfortably?  5 minutes    How long can you stand comfortably?  1 minute needs to lean on things    How long can you walk comfortably?  1 minute    Diagnostic tests  CT scan of head11/20/19: No acute intracranial abnormality    Patient Stated Goals  Strengthening legs    Currently in Pain?  Yes    Pain Score  8     Pain Location  Shoulder    Pain Orientation  Right;Left    Pain Descriptors / Indicators  Aching    Pain Type  Chronic pain    Pain Frequency  Other (Comment)   Constant when moving   Aggravating Factors   Movement    Pain Relieving Factors  Pain pills    Effect of Pain on Daily Activities  Moderately affects  [facility] PT Assessment - 03/08/18 0001      Assessment   Medical Diagnosis  Shuffling gait, memory loss    Referring Provider (PT)  Wolfgang Phoenix, Grace Bushy, MD    Next MD Visit  --   Unknown   Prior Therapy  --   None     Balance Screen   Has the patient fallen in the past 6 months  Yes    How many times?  --   At least 7 times a week   Has the patient had a decrease in activity level because of a fear of falling?   No    Is the patient reluctant to leave their home because of a fear of falling?   No      Home Environment   Living Environment  Private residence    Living Arrangements  Alone    Type of [place] Access  Level entry    Home Layout  One level    [place] - single point    Additional Comments  Woman named Otila Kluver comes to help when he needs her      Prior Function   Level of Independence  Independent with basic ADLs;Independent  with household mobility with device    Vocation  Retired      Associate Professor   Overall Cognitive Status  History of cognitive impairments - at baseline      Observation/Other Assessments   Focus on Therapeutic Outcomes (FOTO)   36% (64% limited)      Posture/Postural Control   Posture/Postural Control  Postural limitations    Postural Limitations  Flexed trunk;Rounded Shoulders;Forward head;Decreased lumbar lordosis      ROM / Strength   AROM / PROM / Strength  Strength      Strength   Strength Assessment Site  Hip;Knee;Ankle    Right/Left Hip  Right;Left    Right Hip Flexion  4/5    Right Hip Extension  2+/5    Right Hip ABduction  3/5    Left Hip Flexion  4/5    Left Hip Extension  2+/5    Left Hip ABduction  3/5    Right/Left Knee  Right;Left    Right Knee Flexion  4/5    Right Knee Extension  4/5    Left Knee Flexion  4-/5    Left Knee Extension  4-/5    Right/Left Ankle  Right;Left    Right Ankle Dorsiflexion  4+/5    Left Ankle Dorsiflexion  4+/5      Ambulation/Gait   Ambulation/Gait  Yes    Ambulation Distance (Feet)  40 Feet    Assistive device  None    Gait Pattern  Shuffle;Trunk flexed;Decreased dorsiflexion - right;Decreased dorsiflexion - left    Ambulation Surface  Level;Indoor      Balance   Balance Assessed  Yes      Standardized Balance Assessment   Standardized Balance Assessment  Berg Balance Test;Timed Up and Go Test      Berg Balance Test   Sit to Stand  Able to stand  independently using hands    Standing Unsupported  Unable to stand 30 seconds unassisted    Sitting with Back Unsupported but Feet Supported on Floor or Stool  Able to sit safely and securely 2 minutes    Stand to Sit  Controls descent by using hands    Transfers  Able to transfer with verbal cueing and /or  supervision    Standing Unsupported with Eyes Closed  Able to stand 10 seconds with supervision    Standing Ubsupported with Feet Together  Needs help to attain position but able  to stand for 30 seconds with feet together    From Standing, Reach Forward with Outstretched Arm  Can reach forward >5 cm safely (2")    From Standing Position, Pick up Object from Floor  Able to pick up shoe, needs supervision    From Standing Position, Turn to Look Behind Over each Shoulder  Looks behind from both sides and weight shifts well    Turn 360 Degrees  Needs close supervision or verbal cueing    Standing Unsupported, Alternately Place Feet on Step/Stool  Able to complete >2 steps/needs minimal assist    Standing Unsupported, One Foot in [employer] balance while stepping or standing    Standing on One Leg  Tries to lift leg/unable to hold 3 seconds but remains standing independently    Total Score  28      Timed Up and Go Test   TUG  Normal TUG    Normal TUG (seconds)  23.6    TUG Comments  Use of hands to stand and minimal guard                Objective measurements completed on examination: See above findings.              PT Education - 03/08/18 0940    Education Details  Educated patient on examination findings and plan of care.     Person(s) Educated  Patient    Methods  Explanation    Comprehension  Verbalized understanding       PT Short Term Goals - 03/08/18 0942      PT SHORT TERM GOAL #1   Title  Patient will report understanding and regular compliance with HEP in order to improve patient's strength, balance and overall functional mobility.     Time  3    Period  Weeks    Status  New    Target Date  03/29/18      PT SHORT TERM GOAL #2   Title  Patient will demonstrate improvement of 1/2 MMT strength grade in all deficient muscle groups to improve gait mechanics and safety with ambulation.     Time  3    Period  Weeks    Status  New    Target Date  03/29/18        PT Long Term Goals - 03/08/18 0945      PT LONG TERM GOAL #1   Title  Patient will demonstrate improvement of 1 MMT strength grade in all deficient muscle groups to  improve gait mechanics and safety with ambulation.     Time  6    Period  Weeks    Status  New    Target Date  04/19/18      PT LONG TERM GOAL #2   Title  Patient will demonstrate improvement on TUG of at least 10 seconds with LRAD indicating improved safety with ambulation at home.     Time  6    Period  Weeks    Status  New    Target Date  04/19/18      PT LONG TERM GOAL #3   Title  Patient will demonstrate improvement of the score on the Berg Balance Scale of at least 6 indicating improved safety and decreased risk of  falls.     Time  6    Period  Weeks    Status  New    Target Date  04/19/18      PT LONG TERM GOAL #4   Title  Patient will be educated on use of assistive device as needed and report ability to ambulate for at least 5 minutes to improve ease with completing household chores.     Time  6    Period  Weeks    Status  New    Target Date  04/19/18             Plan - 03/08/18 1052    Clinical Impression Statement  Patient is a 77 year old male who presented to outpatient physical therapy due to shuffling gait. Patient has a medical history significant for active Leukemia. Patient also presents with memory deficits which have reportedly increased over the past several weeks. Patient went to the hospital due to the memory deficits, but CT scan demonstrated no acute abnormality. Upon examination patient demonstrated decreased lower extremity strength. Patient demonstrated decreased balance on the Berg as well as with the TUG. Patient ambulated with a shuffling gait throughout evaluation and required use of upper extremities for support intermittently. Patient required frequent rest breaks throughout session due to fatigue. Patient would benefit from skilled physical therapy in order to address the abovementioned deficits in order to improve patient's safety and improve patient's functional independence.     History and Personal Factors relevant to plan of care:  Active  Leukemia    Clinical Presentation  Stable    Clinical Presentation due to:  Dannette Barbara, TUG, MMT, clinical judgement    Clinical Decision Making  Moderate    Rehab Potential  Fair    Clinical Impairments Affecting Rehab Potential  Positive: Motivated; Negative: comorbidities    PT Frequency  2x / week    PT Duration  6 weeks    PT Treatment/Interventions  ADLs/Self Care Home Management;Aquatic Therapy;DME Instruction;Gait training;Stair training;Functional mobility training;Therapeutic activities;Therapeutic exercise;Balance training;Neuromuscular re-education;Patient/family education;Orthotic Fit/Training;Manual techniques;Passive range of motion;Dry needling;Taping    PT Next Visit Plan  Review evaluation and goals; Initiate HEP including: bridge, SLR, clams. Gait training with assistive device possibly a cane or walker. Focus on balance training particularly dynamic balance. Postural strengthening as able. Consider referral to speech therapy for memory deficits.     PT Home Exercise Plan  Initiate at first treatment session    Recommended Other Services  Possible referral to speech therapy for memory deficits    Consulted and Agree with Plan of Care  Patient       Patient will benefit from skilled therapeutic intervention in order to improve the following deficits and impairments:  Abnormal gait, Decreased cognition, Improper body mechanics, Pain, Decreased coordination, Decreased mobility, Postural dysfunction, Decreased activity tolerance, Decreased endurance, Decreased strength, Decreased balance, Difficulty walking  Visit Diagnosis: Muscle weakness (generalized)  Other abnormalities of gait and mobility     Problem List Patient Active Problem List   Diagnosis Date Noted  . 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 Goals of care, counseling/discussion 08/13/2017  . Short-term memory loss 06/17/2017  . 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 Anemia 07/21/2016  . GERD (gastroesophageal reflux disease) 01/20/2013  . Barrett's esophagus with esophagitis 01/20/2013  . Hypothyroidism 09/27/2012   Clarene Critchley PT, DPT 10:57 AM, 03/08/18 [place],  [place], [address] Phone: [phone]   Fax:  [phone]  Name: KHAN CHURA MRN: [id number] Date of Birth: 08-Sep-1940

## 2018-03-11 ENCOUNTER — Telehealth: Payer: Self-pay | Admitting: Family Medicine

## 2018-03-11 ENCOUNTER — Telehealth (HOSPITAL_COMMUNITY): Payer: Self-pay | Admitting: Physical Therapy

## 2018-03-11 ENCOUNTER — Inpatient Hospital Stay (HOSPITAL_COMMUNITY): Payer: Medicare HMO

## 2018-03-11 ENCOUNTER — Ambulatory Visit (HOSPITAL_COMMUNITY): Payer: Medicare HMO | Admitting: Physical Therapy

## 2018-03-11 DIAGNOSIS — Z5111 Encounter for antineoplastic chemotherapy: Secondary | ICD-10-CM | POA: Diagnosis not present

## 2018-03-11 DIAGNOSIS — C9200 Acute myeloblastic leukemia, not having achieved remission: Secondary | ICD-10-CM | POA: Diagnosis not present

## 2018-03-11 LAB — CBC WITH DIFFERENTIAL/PLATELET
Abs Immature Granulocytes: 0.09 10*3/uL — ABNORMAL HIGH (ref 0.00–0.07)
Basophils Absolute: 0 10*3/uL (ref 0.0–0.1)
Basophils Relative: 0 %
Eosinophils Absolute: 0 10*3/uL (ref 0.0–0.5)
Eosinophils Relative: 0 %
HEMATOCRIT: 28.3 % — AB (ref 39.0–52.0)
HEMOGLOBIN: 9.4 g/dL — AB (ref 13.0–17.0)
IMMATURE GRANULOCYTES: 8 %
LYMPHS ABS: 0.5 10*3/uL — AB (ref 0.7–4.0)
LYMPHS PCT: 44 %
MCH: 34.3 pg — ABNORMAL HIGH (ref 26.0–34.0)
MCHC: 33.2 g/dL (ref 30.0–36.0)
MCV: 103.3 fL — AB (ref 80.0–100.0)
MONO ABS: 0.4 10*3/uL (ref 0.1–1.0)
MONOS PCT: 35 %
NEUTROS ABS: 0.2 10*3/uL — AB (ref 1.7–7.7)
Neutrophils Relative %: 13 %
Platelets: 45 10*3/uL — ABNORMAL LOW (ref 150–400)
RBC: 2.74 MIL/uL — ABNORMAL LOW (ref 4.22–5.81)
RDW: 16.5 % — ABNORMAL HIGH (ref 11.5–15.5)
WBC: 1.1 10*3/uL — CL (ref 4.0–10.5)
nRBC: 5.3 % — ABNORMAL HIGH (ref 0.0–0.2)

## 2018-03-11 LAB — COMPREHENSIVE METABOLIC PANEL
ALBUMIN: 4.2 g/dL (ref 3.5–5.0)
ALT: 36 U/L (ref 0–44)
AST: 28 U/L (ref 15–41)
Alkaline Phosphatase: 55 U/L (ref 38–126)
Anion gap: 6 (ref 5–15)
BILIRUBIN TOTAL: 0.8 mg/dL (ref 0.3–1.2)
BUN: 13 mg/dL (ref 8–23)
CO2: 25 mmol/L (ref 22–32)
CREATININE: 1.06 mg/dL (ref 0.61–1.24)
Calcium: 8.5 mg/dL — ABNORMAL LOW (ref 8.9–10.3)
Chloride: 106 mmol/L (ref 98–111)
GFR calc Af Amer: >60 mL/min (ref >60)
GLUCOSE: 98 mg/dL (ref 70–99)
Potassium: 3.3 mmol/L — ABNORMAL LOW (ref 3.5–5.1)
Sodium: 137 mmol/L (ref 135–145)
TOTAL PROTEIN: 6.4 g/dL — AB (ref 6.5–8.1)

## 2018-03-11 LAB — SAMPLE TO BLOOD BANK

## 2018-03-11 LAB — MAGNESIUM: Magnesium: 1.9 mg/dL (ref 1.7–2.4)

## 2018-03-11 NOTE — Telephone Encounter (Signed)
Patient came in at 9:28 for his 9:00 appt I offer him an appt at 1 and he didn't care to take it. Patient was upset because knoe one told him about this appt time.

## 2018-03-11 NOTE — Progress Notes (Unsigned)
CRITICAL VALUE ALERT Critical value received:  WBC 1.1  Date of notification:  03-11-2018 Time of notification: 9409 Critical value read back:  Yes.   Nurse who received alert:  C.Zacheriah Stumpe RN MD notified (1st Niyanna Asch):  Dr. Delton Coombes

## 2018-03-11 NOTE — Telephone Encounter (Signed)
Faxed order, demo., ins crd to [place]  filed

## 2018-03-11 NOTE — Telephone Encounter (Signed)
Yes cancel

## 2018-03-11 NOTE — Telephone Encounter (Signed)
Scan canceled at [facility] and pt and Otila Kluver both notified. Pt was scheduled a hospital follow up tomorrow with dr Richardson Landry. Both tina and pt are aware of appt tomorrow.

## 2018-03-11 NOTE — Telephone Encounter (Signed)
Left message to return call with Omar Lee

## 2018-03-11 NOTE — Telephone Encounter (Signed)
Done order sent

## 2018-03-11 NOTE — Telephone Encounter (Signed)
In working on order for a 4 wheeled walker with a seat, noticed that the patient went to the ED and had CT of the head without contrast.   We scheduled the same scan & it's for 03/20/18, should we cancel the 03/20/18??  Please advise & notify Laurine Blazer [phone]

## 2018-03-11 NOTE — Telephone Encounter (Signed)
Therapist called regarding patient not showing up for appointment scheduled at 9:00 am this morning. Therapist left messaging stating she hoped all was well and reminding patient of his next scheduled appointment as well as the phone number for the clinic.  Omar Lee PT, DPT 9:32 AM, 03/11/18 [phone]

## 2018-03-12 ENCOUNTER — Ambulatory Visit: Payer: Medicare HMO | Admitting: Family Medicine

## 2018-03-12 ENCOUNTER — Encounter: Payer: Self-pay | Admitting: Family Medicine

## 2018-03-12 VITALS — BP 112/82 | Ht 67.0 in | Wt 169.0 lb

## 2018-03-12 DIAGNOSIS — F323 Major depressive disorder, single episode, severe with psychotic features: Secondary | ICD-10-CM | POA: Diagnosis not present

## 2018-03-12 DIAGNOSIS — R69 Illness, unspecified: Secondary | ICD-10-CM | POA: Diagnosis not present

## 2018-03-12 DIAGNOSIS — Z1339 Encounter for screening examination for other mental health and behavioral disorders: Secondary | ICD-10-CM | POA: Diagnosis not present

## 2018-03-12 DIAGNOSIS — R413 Other amnesia: Secondary | ICD-10-CM

## 2018-03-12 DIAGNOSIS — R296 Repeated falls: Secondary | ICD-10-CM | POA: Diagnosis not present

## 2018-03-12 DIAGNOSIS — F02818 Dementia in other diseases classified elsewhere, unspecified severity, with other behavioral disturbance: Secondary | ICD-10-CM

## 2018-03-12 DIAGNOSIS — F0281 Dementia in other diseases classified elsewhere with behavioral disturbance: Secondary | ICD-10-CM

## 2018-03-12 MED ORDER — BUPROPION HCL ER (SR) 150 MG PO TB12: ORAL_TABLET | ORAL | 5 refills | Status: DC
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
## 2018-03-13 DIAGNOSIS — F0390 Unspecified dementia without behavioral disturbance: Secondary | ICD-10-CM | POA: Insufficient documentation

## 2018-03-15 ENCOUNTER — Other Ambulatory Visit: Payer: Self-pay | Admitting: *Deleted

## 2018-03-15 ENCOUNTER — Encounter: Payer: Self-pay | Admitting: Family Medicine

## 2018-03-15 MED ORDER — LEVOTHYROXINE SODIUM 125 MCG PO TABS: 125.0000 ug | ORAL_TABLET | Freq: Every day | ORAL | 1 refills | Status: DC

## 2018-03-18 ENCOUNTER — Inpatient Hospital Stay (HOSPITAL_COMMUNITY): Payer: Medicare HMO

## 2018-03-19 ENCOUNTER — Emergency Department (HOSPITAL_COMMUNITY): Payer: Medicare HMO

## 2018-03-19 ENCOUNTER — Encounter (HOSPITAL_COMMUNITY): Payer: Self-pay | Admitting: Emergency Medicine

## 2018-03-19 ENCOUNTER — Other Ambulatory Visit: Payer: Self-pay

## 2018-03-19 ENCOUNTER — Telehealth (HOSPITAL_COMMUNITY): Payer: Self-pay | Admitting: Physical Therapy

## 2018-03-19 ENCOUNTER — Observation Stay (HOSPITAL_COMMUNITY)
Admission: EM | Admit: 2018-03-19 | Discharge: 2018-03-26 | Disposition: A | Discharge status: Home / Self Care | Payer: Medicare HMO | Attending: Internal Medicine | Admitting: Internal Medicine

## 2018-03-19 DIAGNOSIS — Z79899 Other long term (current) drug therapy: Secondary | ICD-10-CM | POA: Diagnosis not present

## 2018-03-19 DIAGNOSIS — D469 Myelodysplastic syndrome, unspecified: Secondary | ICD-10-CM | POA: Diagnosis not present

## 2018-03-19 DIAGNOSIS — R4189 Other symptoms and signs involving cognitive functions and awareness: Secondary | ICD-10-CM | POA: Diagnosis not present

## 2018-03-19 DIAGNOSIS — R55 Syncope and collapse: Secondary | ICD-10-CM | POA: Diagnosis not present

## 2018-03-19 DIAGNOSIS — Y939 Activity, unspecified: Secondary | ICD-10-CM | POA: Diagnosis not present

## 2018-03-19 DIAGNOSIS — C9200 Acute myeloblastic leukemia, not having achieved remission: Secondary | ICD-10-CM | POA: Diagnosis present

## 2018-03-19 DIAGNOSIS — R2681 Unsteadiness on feet: Secondary | ICD-10-CM | POA: Insufficient documentation

## 2018-03-19 DIAGNOSIS — D61818 Other pancytopenia: Secondary | ICD-10-CM

## 2018-03-19 DIAGNOSIS — R69 Illness, unspecified: Secondary | ICD-10-CM | POA: Diagnosis not present

## 2018-03-19 DIAGNOSIS — I951 Orthostatic hypotension: Secondary | ICD-10-CM | POA: Diagnosis not present

## 2018-03-19 DIAGNOSIS — E860 Dehydration: Principal | ICD-10-CM | POA: Insufficient documentation

## 2018-03-19 DIAGNOSIS — R296 Repeated falls: Secondary | ICD-10-CM | POA: Diagnosis not present

## 2018-03-19 DIAGNOSIS — W19XXXA Unspecified fall, initial encounter: Secondary | ICD-10-CM

## 2018-03-19 DIAGNOSIS — W06XXXA Fall from bed, initial encounter: Secondary | ICD-10-CM | POA: Diagnosis not present

## 2018-03-19 DIAGNOSIS — R531 Weakness: Secondary | ICD-10-CM | POA: Diagnosis not present

## 2018-03-19 DIAGNOSIS — E039 Hypothyroidism, unspecified: Secondary | ICD-10-CM | POA: Insufficient documentation

## 2018-03-19 DIAGNOSIS — Z856 Personal history of leukemia: Secondary | ICD-10-CM | POA: Diagnosis not present

## 2018-03-19 DIAGNOSIS — Y999 Unspecified external cause status: Secondary | ICD-10-CM | POA: Diagnosis not present

## 2018-03-19 DIAGNOSIS — F0390 Unspecified dementia without behavioral disturbance: Secondary | ICD-10-CM

## 2018-03-19 DIAGNOSIS — S3992XA Unspecified injury of lower back, initial encounter: Secondary | ICD-10-CM | POA: Diagnosis not present

## 2018-03-19 DIAGNOSIS — Y92009 Unspecified place in unspecified non-institutional (private) residence as the place of occurrence of the external cause: Secondary | ICD-10-CM | POA: Insufficient documentation

## 2018-03-19 DIAGNOSIS — S299XXA Unspecified injury of thorax, initial encounter: Secondary | ICD-10-CM | POA: Diagnosis not present

## 2018-03-19 DIAGNOSIS — Z7189 Other specified counseling: Secondary | ICD-10-CM

## 2018-03-19 HISTORY — DX: Repeated falls: R29.6

## 2018-03-19 HISTORY — DX: Other amnesia: R41.3

## 2018-03-19 LAB — CBC WITH DIFFERENTIAL/PLATELET
Abs Immature Granulocytes: 0.2 10*3/uL — ABNORMAL HIGH (ref 0.00–0.07)
BASOS ABS: 0 10*3/uL (ref 0.0–0.1)
Basophils Relative: 1 %
Eosinophils Absolute: 0 10*3/uL (ref 0.0–0.5)
Eosinophils Relative: 1 %
HCT: 29.2 % — ABNORMAL LOW (ref 39.0–52.0)
Hemoglobin: 9.5 g/dL — ABNORMAL LOW (ref 13.0–17.0)
IMMATURE GRANULOCYTES: 11 %
LYMPHS ABS: 0.5 10*3/uL — AB (ref 0.7–4.0)
LYMPHS PCT: 30 %
MCH: 33.6 pg (ref 26.0–34.0)
MCHC: 32.5 g/dL (ref 30.0–36.0)
MCV: 103.2 fL — AB (ref 80.0–100.0)
Monocytes Absolute: 0.7 10*3/uL (ref 0.1–1.0)
Monocytes Relative: 38 %
NEUTROS ABS: 0.3 10*3/uL — AB (ref 1.7–7.7)
NEUTROS PCT: 19 %
NRBC: 3.4 % — AB (ref 0.0–0.2)
PLATELETS: 65 10*3/uL — AB (ref 150–400)
RBC: 2.83 MIL/uL — ABNORMAL LOW (ref 4.22–5.81)
RDW: 17.1 % — AB (ref 11.5–15.5)
WBC: 1.8 10*3/uL — ABNORMAL LOW (ref 4.0–10.5)

## 2018-03-19 LAB — RAPID URINE DRUG SCREEN, HOSP PERFORMED
Amphetamines: NOT DETECTED
BARBITURATES: NOT DETECTED
BENZODIAZEPINES: NOT DETECTED
Cocaine: NOT DETECTED
Opiates: POSITIVE — AB
Tetrahydrocannabinol: NOT DETECTED

## 2018-03-19 LAB — URINALYSIS, ROUTINE W REFLEX MICROSCOPIC
Bilirubin Urine: NEGATIVE
Glucose, UA: NEGATIVE mg/dL
HGB URINE DIPSTICK: NEGATIVE
Ketones, ur: NEGATIVE mg/dL
LEUKOCYTES UA: NEGATIVE
Nitrite: NEGATIVE
Protein, ur: NEGATIVE mg/dL
Specific Gravity, Urine: 1.02 (ref 1.005–1.030)
pH: 5 (ref 5.0–8.0)

## 2018-03-19 LAB — COMPREHENSIVE METABOLIC PANEL
ALT: 26 U/L (ref 0–44)
AST: 34 U/L (ref 15–41)
Albumin: 3.9 g/dL (ref 3.5–5.0)
Alkaline Phosphatase: 57 U/L (ref 38–126)
Anion gap: 7 (ref 5–15)
BUN: 15 mg/dL (ref 8–23)
CHLORIDE: 104 mmol/L (ref 98–111)
CO2: 24 mmol/L (ref 22–32)
CREATININE: 1.09 mg/dL (ref 0.61–1.24)
Calcium: 8.9 mg/dL (ref 8.9–10.3)
GFR calc Af Amer: >60 mL/min (ref >60)
GLUCOSE: 94 mg/dL (ref 70–99)
Potassium: 3.8 mmol/L (ref 3.5–5.1)
SODIUM: 135 mmol/L (ref 135–145)
Total Bilirubin: 0.9 mg/dL (ref 0.3–1.2)
Total Protein: 6.2 g/dL — ABNORMAL LOW (ref 6.5–8.1)

## 2018-03-19 LAB — TROPONIN I: Troponin I: <0.03 ng/mL (ref <0.03)

## 2018-03-19 LAB — ETHANOL: Alcohol, Ethyl (B): <10 mg/dL (ref <10)

## 2018-03-19 LAB — LACTIC ACID, PLASMA: Lactic Acid, Venous: 0.9 mmol/L (ref 0.5–1.9)

## 2018-03-19 LAB — CK: Total CK: 232 U/L (ref 49–397)

## 2018-03-19 MED ORDER — ALLOPURINOL 300 MG PO TABS
300.0000 mg | ORAL_TABLET | Freq: Every day | ORAL | Status: DC
  Administered 2018-03-20 – 2018-03-26 (×7): 300 mg via ORAL
  Filled 2018-03-19 (×7): qty 1

## 2018-03-19 MED ORDER — BUPROPION HCL ER (SR) 150 MG PO TB12
150.0000 mg | ORAL_TABLET | Freq: Two times a day (BID) | ORAL | Status: DC
  Administered 2018-03-20 – 2018-03-26 (×13): 150 mg via ORAL
  Filled 2018-03-19 (×13): qty 1

## 2018-03-19 MED ORDER — LEVOTHYROXINE SODIUM 25 MCG PO TABS
125.0000 ug | ORAL_TABLET | Freq: Every day | ORAL | Status: DC
  Administered 2018-03-20 – 2018-03-23 (×4): 125 ug via ORAL
  Filled 2018-03-19 (×4): qty 1

## 2018-03-19 MED ORDER — PANTOPRAZOLE SODIUM 40 MG PO TBEC
40.0000 mg | DELAYED_RELEASE_TABLET | Freq: Every day | ORAL | Status: DC
  Administered 2018-03-20 – 2018-03-26 (×7): 40 mg via ORAL
  Filled 2018-03-19 (×7): qty 1

## 2018-03-19 MED ORDER — ACETAMINOPHEN 325 MG PO TABS
650.0000 mg | ORAL_TABLET | Freq: Four times a day (QID) | ORAL | Status: DC | PRN
  Administered 2018-03-23: 650 mg via ORAL
  Filled 2018-03-19: qty 2

## 2018-03-19 MED ORDER — ENSURE ENLIVE PO LIQD
237.0000 mL | Freq: Two times a day (BID) | ORAL | Status: DC
  Administered 2018-03-20 – 2018-03-26 (×10): 237 mL via ORAL

## 2018-03-19 MED ORDER — VENETOCLAX 100 MG PO TABS: 4.0000 | ORAL_TABLET | Freq: Every day | ORAL | Status: DC

## 2018-03-19 MED ORDER — ONDANSETRON HCL 4 MG/2ML IJ SOLN
4.0000 mg | Freq: Four times a day (QID) | INTRAMUSCULAR | Status: DC | PRN
  Administered 2018-03-21: 4 mg via INTRAVENOUS
  Filled 2018-03-19: qty 2

## 2018-03-19 MED ORDER — ACETAMINOPHEN 650 MG RE SUPP: 650.0000 mg | Freq: Four times a day (QID) | RECTAL | Status: DC | PRN

## 2018-03-19 MED ORDER — ONDANSETRON HCL 4 MG PO TABS: 4.0000 mg | ORAL_TABLET | Freq: Four times a day (QID) | ORAL | Status: DC | PRN

## 2018-03-19 MED ORDER — POLYETHYLENE GLYCOL 3350 17 G PO PACK: 17.0000 g | PACK | Freq: Every day | ORAL | Status: DC | PRN

## 2018-03-19 NOTE — ED Provider Notes (Signed)
I personally discussed this case with the hospitalist, I discussed with the family, the patient will be admitted to the hospital for ongoing evaluation of weakness, physical therapy evaluation, will need to be placed.  I did place official consultations to both case management and social work to assist with this process.  I spoke with Barbaraann Rondo with social work who will see the patient in the morning   Noemi Chapel, MD 03/19/18 1850

## 2018-03-19 NOTE — ED Triage Notes (Signed)
Family check to patient this morning, found lying on the floor for unknown amount of time.

## 2018-03-19 NOTE — H&P (Signed)
History and Physical    Omar Lee LOV:[id number] DOB: 1940-12-08 DOA: 03/19/2018  PCP: [name], MD   Patient coming from: Home  Chief Complaint: Multiple falls  HPI: Omar Lee is a 77 y.o. 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  Patient apparently fell out of bed last night, he is unsure what time he fell.  Reports generalized weakness, and subsequent frequent falls. He tells me he thinks he falls because he is on chemo.  No shortness of breath no cough, no chest pain, no burning with urination, no fevers or chills, no vomiting or loose stools, no abdominal pain.  He has tried to maintained good p.o. intake.  ED Course: Stable vitals.  Negative for orthostasis. Pancytopenia, consistent with prior with WBC low 1.8, hemoglobin 9.5, platelets 65.  CK WNL-232.  UDS-positive for opiates.  Clean UA.  Negative alcohol level.  EKG with prolonged QTC 514, with nonspecific T wave changes V2 through V4, prolonged QTC 514.  Negative troponin.  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  No further evaluation felt to be necessary.  Lumbar and portable chest x-ray x-ray without acute abnormality. Hospitalist to admit for multiple falls.  Review of Systems: As per HPI  All other systems reviewed and negative  Past Medical History:  Diagnosis Date  . 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 Frequent falls   . Hyperlipidemia   . Hypothyroidism   . Memory loss   . Reflux   . Sleep apnea     Past Surgical History:  Procedure Laterality Date  . ESOPHAGOGASTRODUODENOSCOPY (EGD) WITH ESOPHAGEAL DILATION N/A 11/22/2012   Procedure: ESOPHAGOGASTRODUODENOSCOPY (EGD) WITH ESOPHAGEAL DILATION;  Surgeon: Rogene Houston, MD;  Location: [facility];  Service: Endoscopy;  Laterality: N/A;   1120  . HEMORRHOID SURGERY    . NASAL SEPTUM SURGERY    . PORTACATH PLACEMENT Left 11/02/2017   Procedure: INSERTION PORT WITH ATTACHED CATHETER LEFT SUBCLAVIAN;  Surgeon: Aviva Signs, MD;  Location: [facility] ORS;  Service: General;  Laterality: Left;  . SHOULDER SURGERY Right   . TONSILLECTOMY AND ADENOIDECTOMY       reports that he has never smoked. He has never used smokeless tobacco. He reports that he does not drink alcohol or use drugs.  No Known Allergies  Family History  Problem Relation Age of Onset  . Hypertension Mother   . Heart attack Mother   . Heart attack Father   . Diabetes Brother     Prior to Admission medications   Medication Sig Start Date End Date Taking? Authorizing Provider  allopurinol (ZYLOPRIM) 300 MG tablet Take 1 tablet (300 mg total) by mouth daily. 09/06/17  Yes Derek Jack, MD  buPROPion [facility] SR) 150 MG 12 hr tablet Start taking the medication one po Q am for the first three days, then start taking one po BID. 03/12/18  Yes [name], MD  fluconazole (DIFLUCAN) 200 MG tablet Take 200 mg by mouth daily.  01/23/18  Yes [provider]  levofloxacin (LEVAQUIN) 500 MG tablet Take 500 mg by mouth daily.  01/23/18  Yes [provider]  levothyroxine (SYNTHROID, LEVOTHROID) 125 MCG tablet Take 1 tablet (125 mcg total) by mouth daily. 03/15/18  Yes [name], MD  Misc. Devices MISC  Please provide patient with one walker with seat (Rollaider). 02/28/18  Yes Derek Jack, MD  pantoprazole (PROTONIX) 40 MG tablet  08/04/17  Yes [provider]  traMADol (ULTRAM) 50 MG tablet Take 2 tablets (100 mg total) by mouth 2 (two) times daily. 03/07/18  Yes Derek Jack, MD  venetoclax (VENCLEXTA) 100 MG TABS Take 4 tablets by mouth daily.   Yes [provider]    Physical Exam: Vitals:   03/19/18 1300 03/19/18 1330 03/19/18 1400 03/19/18 1500  BP: 127/72 (!) 147/74 126/76 110/80  Pulse: 74 76  73 76  Resp:  10 17 19   TempSrc:      SpO2: 100% 100% 98% 100%  Height:        Constitutional: NAD, calm, comfortable Vitals:   03/19/18 1300 03/19/18 1330 03/19/18 1400 03/19/18 1500  BP: 127/72 (!) 147/74 126/76 110/80  Pulse: 74 76 73 76  Resp:  10 17 19   TempSrc:      SpO2: 100% 100% 98% 100%  Height:       Eyes: PERRL, lids and conjunctivae normal ENMT: Mucous membranes are moist. Posterior pharynx clear of any exudate or lesions. Neck: normal, supple, no masses, no thyromegaly Respiratory: clear to auscultation bilaterally, no wheezing, no crackles. Normal respiratory effort. No accessory muscle use.  Cardiovascular: Regular rate and rhythm, no murmurs / rubs / gallops. No extremity edema. 2+ pedal pulses. No carotid bruits.  Abdomen: no tenderness, no masses palpated. No hepatosplenomegaly. Bowel sounds positive.  Musculoskeletal: no clubbing / cyanosis. No joint deformity upper and lower extremities. Good ROM, no contractures. Normal muscle tone.  Skin: no rashes, lesions, ulcers. No induration Neurologic: CN 2-12 grossly intact. Sensation intact, DTR normal. Strength 4+ /5 in all 4.  Psychiatric: Appears to have Normal judgment and insight. Alert and oriented x 3. Normal mood.   Labs on Admission: I have personally reviewed following labs and imaging studies  CBC: Recent Labs  Lab 03/19/18 1215  WBC 1.8*  NEUTROABS 0.3*  HGB 9.5*  HCT 29.2*  MCV 103.2*  PLT 65*   Basic Metabolic Panel: Recent Labs  Lab 03/19/18 1215  NA 135  K 3.8  CL 104  CO2 24  GLUCOSE 94  BUN 15  CREATININE 1.09  CALCIUM 8.9   Liver Function Tests: Recent Labs  Lab 03/19/18 1215  AST 34  ALT 26  ALKPHOS 57  BILITOT 0.9  PROT 6.2*  ALBUMIN 3.9   Cardiac Enzymes: Recent Labs  Lab 03/19/18 1215  CKTOTAL 232  TROPONINI <0.03   Urine analysis:    Component Value Date/Time   COLORURINE YELLOW 03/19/2018 [place] 03/19/2018 1246   LABSPEC 1.020  03/19/2018 1246   PHURINE 5.0 03/19/2018 [place] 03/19/2018 1246   HGBUR NEGATIVE 03/19/2018 1246   BILIRUBINUR NEGATIVE 03/19/2018 1246   KETONESUR NEGATIVE 03/19/2018 1246   PROTEINUR NEGATIVE 03/19/2018 1246   NITRITE NEGATIVE 03/19/2018 1246   LEUKOCYTESUR NEGATIVE 03/19/2018 1246    Radiological Exams on Admission: Dg Chest 1 View  Result Date: 03/19/2018 CLINICAL DATA:  Found lying on the floor, weakness EXAM: CHEST  1 VIEW COMPARISON:  Portable chest x-ray of 11/02/2017 FINDINGS: No active infiltrate or effusion is seen. Mediastinal and hilar contours are stable and mild cardiomegaly is stable. No acute bony abnormality seen, with the bones appearing somewhat osteopenic. IMPRESSION: Stable mild cardiomegaly.  No active lung disease. Electronically Signed   By: Ivar Drape M.D.   On: 03/19/2018  12:19   Dg Lumbar Spine Complete  Result Date: 03/19/2018 CLINICAL DATA:  Found on for following fall EXAM: LUMBAR SPINE - COMPLETE 4+ VIEW COMPARISON:  None. FINDINGS: Five lumbar type vertebral bodies are well visualized. Vertebral body height is well maintained. No pars defects are noted. No anterolisthesis is seen. Disc space narrowing is noted at L3-4 L4-5 and L5-S1 with associated osteophytes. No soft tissue abnormality is noted. IMPRESSION: Degenerative change without acute abnormality. Electronically Signed   By: Inez Catalina M.D.   On: 03/19/2018 12:21   Ct Head Wo Contrast  Result Date: 03/19/2018 CLINICAL DATA:  Family check to patient this morning, found lying on the floor for unknown amount of time EXAM: CT HEAD WITHOUT CONTRAST CT CERVICAL SPINE WITHOUT CONTRAST TECHNIQUE: Multidetector CT imaging of the head and cervical spine was performed following the standard protocol without intravenous contrast. Multiplanar CT image reconstructions of the cervical spine were also generated. COMPARISON:  03/06/2018 FINDINGS: CT HEAD FINDINGS Brain: There is central and cortical  atrophy. Periventricular white matter changes are consistent with small vessel disease. There is no intra or extra-axial fluid collection or mass lesion. The basilar cisterns and ventricles have a normal appearance. There is no CT evidence for acute infarction or hemorrhage. Vascular: There is minimal atherosclerotic calcification of the internal carotid arteries. No hyperdense vessels. Skull: Intact. A circumscribed partially sclerotic lesion in the frontal bone, to the RIGHT of midline measures 2.0 x 1.7 centimeters, focally expanding the frontal bone. Sinuses/Orbits: No acute finding. Other: None CT CERVICAL SPINE FINDINGS Alignment: There is moderate degenerative change in the midcervical spine. Convex RIGHT scoliosis versus positioning. Skull base and vertebrae: No acute fracture. No primary bone lesion or focal pathologic process. Soft tissues and spinal canal: No prevertebral fluid or swelling. No visible canal hematoma. Disc levels: Moderate disc height loss and osteophytes at C4-5, C5-6, C6-7. Upper chest: Negative. Other: There is atherosclerotic calcification of the carotid arteries. IMPRESSION: 1.  No evidence for acute intracranial abnormality. 2. Atrophy and small vessel disease. 3. No acute cervical spine fracture. Moderate mid cervical spondylosis. 4. 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 No further evaluation is felt to be necessary. Electronically Signed   By: Nolon Nations M.D.   On: 03/19/2018 12:27   Ct Cervical Spine Wo Contrast  Result Date: 03/19/2018 CLINICAL DATA:  Family check to patient this morning, found lying on the floor for unknown amount of time EXAM: CT HEAD WITHOUT CONTRAST CT CERVICAL SPINE WITHOUT CONTRAST TECHNIQUE: Multidetector CT imaging of the head and cervical spine was performed following the standard protocol without intravenous contrast. Multiplanar CT image reconstructions of the cervical spine were also generated.  COMPARISON:  03/06/2018 FINDINGS: CT HEAD FINDINGS Brain: There is central and cortical atrophy. Periventricular white matter changes are consistent with small vessel disease. There is no intra or extra-axial fluid collection or mass lesion. The basilar cisterns and ventricles have a normal appearance. There is no CT evidence for acute infarction or hemorrhage. Vascular: There is minimal atherosclerotic calcification of the internal carotid arteries. No hyperdense vessels. Skull: Intact. A circumscribed partially sclerotic lesion in the frontal bone, to the RIGHT of midline measures 2.0 x 1.7 centimeters, focally expanding the frontal bone. Sinuses/Orbits: No acute finding. Other: None CT CERVICAL SPINE FINDINGS Alignment: There is moderate degenerative change in the midcervical spine. Convex RIGHT scoliosis versus positioning. Skull base and vertebrae: No acute fracture. No primary bone lesion or focal pathologic process. Soft tissues  and spinal canal: No prevertebral fluid or swelling. No visible canal hematoma. Disc levels: Moderate disc height loss and osteophytes at C4-5, C5-6, C6-7. Upper chest: Negative. Other: There is atherosclerotic calcification of the carotid arteries. IMPRESSION: 1.  No evidence for acute intracranial abnormality. 2. Atrophy and small vessel disease. 3. No acute cervical spine fracture. Moderate mid cervical spondylosis. 4. 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 No further evaluation is felt to be necessary. Electronically Signed   By: Nolon Nations M.D.   On: 03/19/2018 12:27    EKG: Independently reviewed.  Specific T wave changes V2 through V4.  QTC prolonged 514.  Assessment/Plan Active Problems:   Multiple falls   Multiple falls-likely secondary to unsteadiness/generalized weakness. ?increased frailty.  Head and cervical CT negative for acute abnormality.  Infectious etiology at this time.  Negative orthostatic vitals. -PT  evaluation -Social work consult  Dementia, ? depression with psychotic features-Per chart patient hoards. Was to follow up with psych and neurology per PCP. -Continue Wellbutrin  Prolong QTC- 514, K- 3.8. EKG with nonspecific T wave changes V2 through V4.  Troponin x1- neg. No chest pain, or SOB. Patient recently started on Wellbutrin for depression. - EKG a.m. 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 - Cont ventoclax  DVT prophylaxis: Scds Code Status: Full Family Communication: None at bedside Disposition Plan: Per rounding team Consults called: None Admission status: Obs, tele   Bethena Roys MD [facility] Pager [phone] From 3PM- 11PM.  Otherwise please contact night-coverage [email or web address] Password [email or web address]  03/19/2018, 9:52 PM

## 2018-03-19 NOTE — ED Notes (Signed)
Patient transported to CT 

## 2018-03-19 NOTE — Telephone Encounter (Signed)
Ms. Omar Lee called patient is in hospital and can not be here on these days.NF

## 2018-03-19 NOTE — ED Provider Notes (Signed)
[facility] EMERGENCY DEPARTMENT Provider Note   CSN: [id number] Arrival date & time: 03/19/18  1102     History   Chief Complaint Chief Complaint  Patient presents with  . Weakness    HPI Omar Lee is a 77 y.o. male.  The history is provided by the patient, the EMS personnel and a caregiver. The history is limited by the condition of the patient (Hx dementia).  Weakness     Pt was seen at 1120. Per EMS, pt's friend, and pt: Pt states he fell out of bed sometime last night and was found by his caregiver/friend PTA. Pt does not know what time it was when he fell. Pt states he could not get up due to generalized weakness. Endorses frequent falls and "I've got bruises all over." Denies CP/SOB, no cough, no abd pain, no N/V/D, no neck or back pain, no specific joint(s) pain, no focal motor weakness, no tingling/numbness in extremities.     Past Medical History:  Diagnosis Date  . Cancer ([place])    MDS  . Frequent falls   . Hyperlipidemia   . Hypothyroidism   . Memory loss   . Reflux   . Sleep apnea     Patient Active Problem List   Diagnosis Date Noted  . Dementia ([place]) 03/13/2018  . 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 Goals of care, counseling/discussion 08/13/2017  . Short-term memory loss 06/17/2017  . 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 Anemia 07/21/2016  . GERD (gastroesophageal reflux disease) 01/20/2013  . Barrett's esophagus with esophagitis 01/20/2013  . Hypothyroidism 09/27/2012    Past Surgical History:  Procedure Laterality Date  . ESOPHAGOGASTRODUODENOSCOPY (EGD) WITH ESOPHAGEAL DILATION N/A 11/22/2012   Procedure: ESOPHAGOGASTRODUODENOSCOPY (EGD) WITH ESOPHAGEAL DILATION;  Surgeon: Rogene Houston, MD;  Location: [facility] ENDO SUITE;  Service: Endoscopy;  Laterality: N/A;  1120  . HEMORRHOID SURGERY    . NASAL SEPTUM SURGERY    . PORTACATH PLACEMENT Left 11/02/2017   Procedure: INSERTION PORT WITH  ATTACHED CATHETER LEFT SUBCLAVIAN;  Surgeon: Aviva Signs, MD;  Location: [facility] ORS;  Service: General;  Laterality: Left;  . SHOULDER SURGERY Right   . TONSILLECTOMY AND ADENOIDECTOMY          Home Medications    Prior to Admission medications   Medication Sig Start Date End Date Taking? Authorizing Provider  allopurinol (ZYLOPRIM) 300 MG tablet Take 1 tablet (300 mg total) by mouth daily. 09/06/17  Yes Derek Jack, MD  buPROPion [facility] SR) 150 MG 12 hr tablet Start taking the medication one po Q am for the first three days, then start taking one po BID. 03/12/18  Yes [name], MD  fluconazole (DIFLUCAN) 200 MG tablet Take 200 mg by mouth daily.  01/23/18  Yes [provider]  levofloxacin (LEVAQUIN) 500 MG tablet Take 500 mg by mouth daily.  01/23/18  Yes [provider]  levothyroxine (SYNTHROID, LEVOTHROID) 125 MCG tablet Take 1 tablet (125 mcg total) by mouth daily. 03/15/18  Yes [name], MD  Misc. Devices MISC Please provide patient with one walker with seat (Rolla[employer]). 02/28/18  Yes Derek Jack, MD  pantoprazole (PROTONIX) 40 MG tablet  08/04/17  Yes [provider]  traMADol (ULTRAM) 50 MG tablet Take 2 tablets (100 mg total) by mouth 2 (two) times daily. 03/07/18  Yes Derek Jack, MD  venetoclax (VENCLEXTA) 100 MG TABS Take 4 tablets by mouth daily.   Yes [provider]    Family History Family History  Problem Relation Age of Onset  . Hypertension Mother   . Heart attack Mother   . Heart attack Father   . Diabetes Brother     Social History Social History   Tobacco Use  . Smoking status: Never Smoker  . Smokeless tobacco: Never Used  Substance Use Topics  . Alcohol use: No  . Drug use: No     Allergies   Patient has no known allergies.   Review of Systems Review of Systems  Unable to perform ROS: Dementia  Neurological: Positive for weakness.     Physical Exam Updated  Vital Signs BP (!) 147/74   Pulse 76   Resp 10   Ht 5\' 7"  (1.702 m)   SpO2 100%   BMI 26.47 kg/m    Patient Vitals for the past 24 hrs:  BP Temp src Pulse Resp SpO2 Height  03/19/18 1500 110/80 - 76 19 100 % -  03/19/18 1400 126/76 - 73 17 98 % -  03/19/18 1330 (!) 147/74 - 76 10 100 % -  03/19/18 1300 127/72 - 74 - 100 % -  03/19/18 1130 123/80 - 66 18 100 % -  03/19/18 1109 - - - - - 5\' 7"  (1.702 m)  03/19/18 1105 (!) 157/75 Oral 72 14 100 % -     14:05 Orthostatic Vital Signs VB  Orthostatic Lying   BP- Lying: 126/76   Pulse- Lying: 84       Orthostatic Sitting  BP- Sitting: 133/67   Pulse- Sitting: 79       Orthostatic Standing at 0 minutes  BP- Standing at 0 minutes: 115/71   Pulse- Standing at 0 minutes: 105      Physical Exam 1125: Physical examination:  Nursing notes reviewed; Vital signs and O2 SAT reviewed;  Constitutional: Well developed, Well nourished, In no acute distress; Head:  Normocephalic, atraumatic; Eyes: EOMI, PERRL, No scleral icterus; ENMT: Mouth and pharynx normal, Mucous membranes dry; Neck: Supple, Full range of motion, No lymphadenopathy; Cardiovascular: Regular rate and rhythm, No gallop; Respiratory: Breath sounds clear & equal bilaterally, No wheezes.  Speaking full sentences with ease, Normal respiratory effort/excursion; Chest: Nontender, Movement normal; Abdomen: Soft, Nontender, Nondistended, Normal bowel sounds; Genitourinary: No CVA tenderness; Spine:  No midline CS, TS, LS tenderness.;; Extremities: Peripheral pulses normal, Pelvis stable. +scattered ecchymosis in various stages to bilat UE's and LE's. No deformity. No tenderness, No edema, No calf edema or asymmetry.; Neuro: Awake, alert, confused re: events. No facial droop.  Speech clear. Grips equal. Strength 4/5 equal bilat UE's and LE's. No gross focal motor deficits in extremities.; Skin: Color normal, Warm, Dry.    ED Treatments / Results  Labs (all labs ordered are listed,  but only abnormal results are displayed)   EKG EKG Interpretation  Date/Time:  Tuesday March 19 2018 11:07:07 EST Ventricular Rate:  72 PR Interval:    QRS Duration: 92 QT Interval:  469 QTC Calculation: 514 R Axis:   -83 Text Interpretation:  Sinus rhythm Low voltage, extremity leads Abnormal R-wave progression, late transition Nonspecific T abnormalities, lateral leads Prolonged QT interval When compared with ECG of 11/02/2017 QT has lengthened Confirmed by Francine Graven [phone]) on 03/19/2018 1:48:02 PM   Radiology   Procedures Procedures (including critical care time)  Medications Ordered in ED Medications - No data to display   Initial Impression / Assessment and Plan / ED Course  I have reviewed the triage  vital signs and the nursing notes.  Pertinent labs & imaging results that were available during my care of the patient were reviewed by me and considered in my medical decision making (see chart for details).  MDM Reviewed: previous chart, nursing note and vitals Reviewed previous: labs and ECG Interpretation: ECG, labs, x-ray and CT scan   Results for orders placed or performed during the hospital encounter of 03/19/18  Comprehensive metabolic panel  Result Value Ref Range   Sodium 135 135 - 145 mmol/L   Potassium 3.8 3.5 - 5.1 mmol/L   Chloride 104 98 - 111 mmol/L   CO2 24 22 - 32 mmol/L   Glucose, Bld 94 70 - 99 mg/dL   BUN 15 8 - 23 mg/dL   Creatinine, Ser 1.09 0.61 - 1.24 mg/dL   Calcium 8.9 8.9 - 10.3 mg/dL   Total Protein 6.2 (L) 6.5 - 8.1 g/dL   Albumin 3.9 3.5 - 5.0 g/dL   AST 34 15 - 41 U/L   ALT 26 0 - 44 U/L   Alkaline Phosphatase 57 38 - 126 U/L   Total Bilirubin 0.9 0.3 - 1.2 mg/dL   GFR calc non Af Amer >60 >60 mL/min   GFR calc Af Amer >60 >60 mL/min   Anion gap 7 5 - 15  Ethanol  Result Value Ref Range   Alcohol, Ethyl (B) <10 <10 mg/dL  Troponin I - Once  Result Value Ref Range   Troponin I <0.03 <0.03 ng/mL  Lactic acid,  plasma  Result Value Ref Range   Lactic Acid, Venous 0.9 0.5 - 1.9 mmol/L  CBC with Differential  Result Value Ref Range   WBC 1.8 (L) 4.0 - 10.5 K/uL   RBC 2.83 (L) 4.22 - 5.81 MIL/uL   Hemoglobin 9.5 (L) 13.0 - 17.0 g/dL   HCT 29.2 (L) 39.0 - 52.0 %   MCV 103.2 (H) 80.0 - 100.0 fL   MCH 33.6 26.0 - 34.0 pg   MCHC 32.5 30.0 - 36.0 g/dL   RDW 17.1 (H) 11.5 - 15.5 %   Platelets 65 (L) 150 - 400 K/uL   nRBC 3.4 (H) 0.0 - 0.2 %   Neutrophils Relative % 19 %   Neutro Abs 0.3 (L) 1.7 - 7.7 K/uL   Lymphocytes Relative 30 %   Lymphs Abs 0.5 (L) 0.7 - 4.0 K/uL   Monocytes Relative 38 %   Monocytes Absolute 0.7 0.1 - 1.0 K/uL   Eosinophils Relative 1 %   Eosinophils Absolute 0.0 0.0 - 0.5 K/uL   Basophils Relative 1 %   Basophils Absolute 0.0 0.0 - 0.1 K/uL   WBC Morphology MILD LEFT SHIFT (1-5% METAS, OCC MYELO, OCC BANDS)    RBC Morphology ANISOCYTES AND RARE NRBCS    Immature Granulocytes 11 %   Abs Immature Granulocytes 0.20 (H) 0.00 - 0.07 K/uL   Schistocytes PRESENT    Burr Cells PRESENT    Basophilic Stippling PRESENT   CK  Result Value Ref Range   Total CK 232 49 - 397 U/L  Urine rapid drug screen (hosp performed)  Result Value Ref Range   Opiates POSITIVE (A) NONE DETECTED   Cocaine NONE DETECTED NONE DETECTED   Benzodiazepines NONE DETECTED NONE DETECTED   Amphetamines NONE DETECTED NONE DETECTED   Tetrahydrocannabinol NONE DETECTED NONE DETECTED   Barbiturates NONE DETECTED NONE DETECTED  Urinalysis, Routine w reflex microscopic  Result Value Ref Range   Color, Urine YELLOW YELLOW   APPearance CLEAR  CLEAR   Specific Gravity, Urine 1.020 1.005 - 1.030   pH 5.0 5.0 - 8.0   Glucose, UA NEGATIVE NEGATIVE mg/dL   Hgb urine dipstick NEGATIVE NEGATIVE   Bilirubin Urine NEGATIVE NEGATIVE   Ketones, ur NEGATIVE NEGATIVE mg/dL   Protein, ur NEGATIVE NEGATIVE mg/dL   Nitrite NEGATIVE NEGATIVE   Leukocytes, UA NEGATIVE NEGATIVE   Dg Chest 1 View Result Date:  03/19/2018 CLINICAL DATA:  Found lying on the floor, weakness EXAM: CHEST  1 VIEW COMPARISON:  Portable chest x-ray of 11/02/2017 FINDINGS: No active infiltrate or effusion is seen. Mediastinal and hilar contours are stable and mild cardiomegaly is stable. No acute bony abnormality seen, with the bones appearing somewhat osteopenic. IMPRESSION: Stable mild cardiomegaly.  No active lung disease. Electronically Signed   By: Ivar Drape M.D.   On: 03/19/2018 12:19   Dg Lumbar Spine Complete Result Date: 03/19/2018 CLINICAL DATA:  Found on for following fall EXAM: LUMBAR SPINE - COMPLETE 4+ VIEW COMPARISON:  None. FINDINGS: Five lumbar type vertebral bodies are well visualized. Vertebral body height is well maintained. No pars defects are noted. No anterolisthesis is seen. Disc space narrowing is noted at L3-4 L4-5 and L5-S1 with associated osteophytes. No soft tissue abnormality is noted. IMPRESSION: Degenerative change without acute abnormality. Electronically Signed   By: Inez Catalina M.D.   On: 03/19/2018 12:21   Ct Head Wo Contrast Result Date: 03/19/2018 CLINICAL DATA:  Family check to patient this morning, found lying on the floor for unknown amount of time EXAM: CT HEAD WITHOUT CONTRAST CT CERVICAL SPINE WITHOUT CONTRAST TECHNIQUE: Multidetector CT imaging of the head and cervical spine was performed following the standard protocol without intravenous contrast. Multiplanar CT image reconstructions of the cervical spine were also generated. COMPARISON:  03/06/2018 FINDINGS: CT HEAD FINDINGS Brain: There is central and cortical atrophy. Periventricular white matter changes are consistent with small vessel disease. There is no intra or extra-axial fluid collection or mass lesion. The basilar cisterns and ventricles have a normal appearance. There is no CT evidence for acute infarction or hemorrhage. Vascular: There is minimal atherosclerotic calcification of the internal carotid arteries. No hyperdense  vessels. Skull: Intact. A circumscribed partially sclerotic lesion in the frontal bone, to the RIGHT of midline measures 2.0 x 1.7 centimeters, focally expanding the frontal bone. Sinuses/Orbits: No acute finding. Other: None CT CERVICAL SPINE FINDINGS Alignment: There is moderate degenerative change in the midcervical spine. Convex RIGHT scoliosis versus positioning. Skull base and vertebrae: No acute fracture. No primary bone lesion or focal pathologic process. Soft tissues and spinal canal: No prevertebral fluid or swelling. No visible canal hematoma. Disc levels: Moderate disc height loss and osteophytes at C4-5, C5-6, C6-7. Upper chest: Negative. Other: There is atherosclerotic calcification of the carotid arteries. IMPRESSION: 1.  No evidence for acute intracranial abnormality. 2. Atrophy and small vessel disease. 3. No acute cervical spine fracture. Moderate mid cervical spondylosis. 4. 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 No further evaluation is felt to be necessary. Electronically Signed   By: Nolon Nations M.D.   On: 03/19/2018 12:27    Ct Cervical Spine Wo Contrast Result Date: 03/19/2018 CLINICAL DATA:  Family check to patient this morning, found lying on the floor for unknown amount of time EXAM: CT HEAD WITHOUT CONTRAST CT CERVICAL SPINE WITHOUT CONTRAST TECHNIQUE: Multidetector CT imaging of the head and cervical spine was performed following the standard protocol without intravenous contrast. Multiplanar CT image reconstructions of  the cervical spine were also generated. COMPARISON:  03/06/2018 FINDINGS: CT HEAD FINDINGS Brain: There is central and cortical atrophy. Periventricular white matter changes are consistent with small vessel disease. There is no intra or extra-axial fluid collection or mass lesion. The basilar cisterns and ventricles have a normal appearance. There is no CT evidence for acute infarction or hemorrhage. Vascular: There is  minimal atherosclerotic calcification of the internal carotid arteries. No hyperdense vessels. Skull: Intact. A circumscribed partially sclerotic lesion in the frontal bone, to the RIGHT of midline measures 2.0 x 1.7 centimeters, focally expanding the frontal bone. Sinuses/Orbits: No acute finding. Other: None CT CERVICAL SPINE FINDINGS Alignment: There is moderate degenerative change in the midcervical spine. Convex RIGHT scoliosis versus positioning. Skull base and vertebrae: No acute fracture. No primary bone lesion or focal pathologic process. Soft tissues and spinal canal: No prevertebral fluid or swelling. No visible canal hematoma. Disc levels: Moderate disc height loss and osteophytes at C4-5, C5-6, C6-7. Upper chest: Negative. Other: There is atherosclerotic calcification of the carotid arteries. IMPRESSION: 1.  No evidence for acute intracranial abnormality. 2. Atrophy and small vessel disease. 3. No acute cervical spine fracture. Moderate mid cervical spondylosis. 4. 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 No further evaluation is felt to be necessary. Electronically Signed   By: Nolon Nations M.D.   On: 03/19/2018 12:27    1620:  Not orthostatic on VS. CBC seems to be near pt's baseline. Question pt's safety at home alone; will need admit. Sign out to oncoming EDP.       Final Clinical Impressions(s) / ED Diagnoses   Final diagnoses:  Fall, initial encounter    ED Discharge Orders    None       Francine Graven, DO 03/23/18 0805

## 2018-03-20 ENCOUNTER — Other Ambulatory Visit: Payer: Self-pay

## 2018-03-20 ENCOUNTER — Encounter (HOSPITAL_COMMUNITY): Payer: Medicare HMO | Admitting: Physical Therapy

## 2018-03-20 ENCOUNTER — Other Ambulatory Visit (HOSPITAL_COMMUNITY): Payer: Self-pay | Admitting: Pharmacist

## 2018-03-20 ENCOUNTER — Ambulatory Visit (HOSPITAL_COMMUNITY): Payer: Medicare HMO

## 2018-03-20 DIAGNOSIS — E860 Dehydration: Secondary | ICD-10-CM | POA: Diagnosis not present

## 2018-03-20 DIAGNOSIS — R69 Illness, unspecified: Secondary | ICD-10-CM | POA: Diagnosis not present

## 2018-03-20 DIAGNOSIS — C9200 Acute myeloblastic leukemia, not having achieved remission: Secondary | ICD-10-CM

## 2018-03-20 DIAGNOSIS — R531 Weakness: Secondary | ICD-10-CM

## 2018-03-20 DIAGNOSIS — I951 Orthostatic hypotension: Secondary | ICD-10-CM | POA: Diagnosis not present

## 2018-03-20 DIAGNOSIS — D61818 Other pancytopenia: Secondary | ICD-10-CM | POA: Diagnosis not present

## 2018-03-20 DIAGNOSIS — Z7189 Other specified counseling: Secondary | ICD-10-CM | POA: Diagnosis not present

## 2018-03-20 MED ORDER — POTASSIUM CHLORIDE IN NACL 20-0.9 MEQ/L-% IV SOLN
INTRAVENOUS | Status: AC
  Administered 2018-03-20 – 2018-03-21 (×2): via INTRAVENOUS

## 2018-03-20 NOTE — Evaluation (Signed)
Physical Therapy Evaluation Patient Details Name: Omar Lee MRN: [id number] DOB: 1940-11-07 Today's Date: 03/20/2018   History of Present Illness  Omar Lee is a 77 y.o. 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  Patient apparently fell out of bed last night, he is unsure what time he fell.  Reports generalized weakness, and subsequent frequent falls. He tells me he thinks he falls because he is on chemo.  No shortness of breath no cough, no chest pain, no burning with urination, no fevers or chills, no vomiting or loose stools, no abdominal pain.  He has tried to maintained good p.o. intake.    Clinical Impression  Patient is at severe risk for falls due to BLE weakness with inability to fully extend knees, unable to stand without use of RW, very unsteady and limited for ambulation due to poor standing balance and fatigue.  Patient tolerated sitting up in chair to eat breakfast after therapy.  Patient will benefit from continued physical therapy in hospital and recommended venue below to increase strength, balance, endurance for safe ADLs and gait.    Follow Up Recommendations SNF    Equipment Recommendations  None recommended by PT    Recommendations for Other Services       Precautions / Restrictions Precautions Precautions: Fall Restrictions Weight Bearing Restrictions: No      Mobility  Bed Mobility Overal bed mobility: Needs Assistance Bed Mobility: Supine to Sit     Supine to sit: Min assist;Mod assist     General bed mobility comments: slow labored movement  Transfers Overall transfer level: Needs assistance Equipment used: None;Rolling walker (2 wheeled) Transfers: Sit to/from [facility] Sit to Stand: Min assist;Mod assist Stand pivot transfers: Min assist;Mod assist       General transfer comment: unable  to sit to stand without AD due to weakness, required use of RW  Ambulation/Gait Ambulation/Gait assistance: Mod assist Gait Distance (Feet): 20 Feet Assistive device: Rolling walker (2 wheeled) Gait Pattern/deviations: Decreased step length - right;Decreased step length - left;Decreased stride length Gait velocity: slow   General Gait Details: slow unsteady labored cadence with inability to fully extend knees due to weakness, near loss of balance returning to bedside  Stairs            Wheelchair Mobility    Modified Rankin (Stroke Patients Only)       Balance Overall balance assessment: Needs assistance Sitting-balance support: Feet supported;No upper extremity supported Sitting balance-Leahy Scale: Fair Sitting balance - Comments: occasional leaning to the right Postural control: Right lateral lean Standing balance support: No upper extremity supported;During functional activity Standing balance-Leahy Scale: Poor Standing balance comment: fair/poor using RW                             Pertinent Vitals/Pain Pain Assessment: No/denies pain    Home Living Family/patient expects to be discharged to:: Private residence Living Arrangements: Alone Available Help at Discharge: Friend(s) Type of Home: House Home Access: Level entry     Home Layout: Multi-level;Laundry or work area in [place]: Environmental consultant - 2 wheels;Cane - single point      Prior Function Level of Independence: Needs assistance   Gait / Transfers Assistance Needed: household and short distanced community ambulator, drives "per patient"  ADL's / Homemaking Assistance Needed:  friend helps with cook/cleaning 4 hours/day x 3 days/week        Hand Dominance        Extremity/Trunk Assessment   Upper Extremity Assessment Upper Extremity Assessment: Generalized weakness    Lower Extremity Assessment Lower Extremity Assessment: Generalized weakness    Cervical / Trunk  Assessment Cervical / Trunk Assessment: Normal  Communication   Communication: No difficulties  Cognition Arousal/Alertness: Awake/alert Behavior During Therapy: WFL for tasks assessed/performed Overall Cognitive Status: Within Functional Limits for tasks assessed                                        General Comments      Exercises     Assessment/Plan    PT Assessment Patient needs continued PT services  PT Problem List Decreased strength;Decreased activity tolerance;Decreased balance;Decreased mobility       PT Treatment Interventions Gait training;Stair training;Functional mobility training;Therapeutic activities;Therapeutic exercise;Patient/family education    PT Goals (Current goals can be found in the Care Plan section)  Acute Rehab PT Goals Patient Stated Goal: return home after rehab PT Goal Formulation: With patient Time For Goal Achievement: 04/03/18 Potential to Achieve Goals: Good    Frequency Min 3X/week   Barriers to discharge        Co-evaluation               AM-PAC PT "6 Clicks" Mobility  Outcome Measure Help needed turning from your back to your side while in a flat bed without using bedrails?: Total Help needed moving from lying on your back to sitting on the side of a flat bed without using bedrails?: Total Help needed moving to and from a bed to a chair (including a wheelchair)?: Total Help needed standing up from a chair using your arms (e.g., wheelchair or bedside chair)?: A Lot Help needed to walk in hospital room?: A Lot Help needed climbing 3-5 steps with a railing? : A Lot 6 Click Score: 9    End of Session Equipment Utilized During Treatment: Gait belt Activity Tolerance: Patient limited by fatigue;Patient tolerated treatment well Patient left: in chair;with call bell/phone within reach;with chair alarm set Nurse Communication: Mobility status PT Visit Diagnosis: Unsteadiness on feet (R26.81);Other abnormalities  of gait and mobility (R26.89);Muscle weakness (generalized) (M62.81)    Time: 3825-0539 PT Time Calculation (min) (ACUTE ONLY): 25 min   Charges:   PT Evaluation $PT Eval Moderate Complexity: 1 Mod PT Treatments $Therapeutic Activity: 23-37 mins        9:16 AM, 03/20/18 Lonell Grandchild, MPT Physical Therapist with [facility] [phone] office [phone] mobile phone

## 2018-03-20 NOTE — Care Management Obs Status (Signed)
[facility] NOTIFICATION   Patient Details  Name: Omar Lee MRN: [id number] Date of Birth: 12-Oct-1940   Medicare Observation Status Notification Given:  Yes    Shelda Altes 03/20/2018, 1:29 PM

## 2018-03-20 NOTE — Progress Notes (Signed)
Initial Nutrition Assessment  DOCUMENTATION CODES:  Not applicable  INTERVENTION:  Continue Ensure Enlive po BID, each supplement provides 350 kcal and 20 grams of protein  Please note, pt has lost >5% bw in past month, likely resultant from a poor intake brought on due to decline in mental state   NUTRITION DIAGNOSIS:  Inadequate oral intake related to lethargy/confusion as evidenced by a loss of ~7% of BW x 1 month  GOAL:  Patient will meet greater than or equal to 90% of their needs  MONITOR:  PO intake, Supplement acceptance, Diet advancement, Labs  REASON FOR ASSESSMENT:  Malnutrition Screening Tool    ASSESSMENT:  77 y/o male PMHx Dementia, Hypothyroidism, 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 Brought to ED after found down at his home by friend. Recently has had worsening confusion and increased number of falls. Admitted for observation.   Pt displays some confusion and only answers roughly half of the questions asked appropriately. He is frustrated during our conversation because he is trying to figure out where his "2 canes" and wallet are at. He thinks they are at the "front office".   Pt reports he has not eaten well recently. He says the oral chemotherapy he was taking caused him to have significant nausea. He reports he would "throw up any time I ate". He says he reported this to his oncology provider. RD reviewed his last oncology and dietitian encounters. During neither of these is there any mention of nausea or vomiting.   He reports a loss of "60 lbs". He says his UBW is 190 lbs.   Given pts confusion, most of information obtained from chart. Per chart history, the patient apparently has had a signficant decline in his mental state this past month, which appears to be reflected in his weight history. He went from 175.3 lbs in mid November to 163 lbs now, a loss of ~7% bw in <1 month. At a PCP visit, caregiver noted his mental status noticeably worsened with initiation of chemo. While  there is no direct mention of poor intake, based on degree of wt loss, it is highly suspected pt's po intake has also significantly declined.   He also appears to have lost wt long term. Chart shows that prior to the beginning of this year, he had maintained a UBW of roughly 190-200 lbs for >5 years. He began to fall from this range this past spring; this coincides perfectly w/ his dx of AML and start of chemo. He was 175-180 from April-June and 170-175 from [facility], which is when this episode of significant confusion began  At this time, the patient appears to be eating well-documented as eating 100% of breajfast. Will continue Ensure given his degree of weight loss. He appears to be consuming these. Given pt is eating well in hospital, suspect pt's confusion and inability to manage selfcare is more issue than appetite. Feel his nutrition status will improve greatly with increased level of support, such as at SNF.   Labs: Pancytopenia (has been on chemo) Meds: Ensure, PPI  Recent Labs  Lab 03/19/18 1215  NA 135  K 3.8  CL 104  CO2 24  BUN 15  CREATININE 1.09  CALCIUM 8.9  GLUCOSE 94   NUTRITION - FOCUSED PHYSICAL EXAM:   Most Recent Value  Orbital Region  No depletion  Upper Arm Region  No depletion  Thoracic and Lumbar Region  Mild depletion  Buccal Region  No depletion  Temple Region  No depletion  Clavicle Bone  Region  No depletion  Clavicle and Acromion Bone Region  Mild depletion  Scapular Bone Region  No depletion  Dorsal Hand  No depletion  Patellar Region  No depletion  Anterior Thigh Region  No depletion  Posterior Calf Region  No depletion  Edema (RD Assessment)  None  Hair  Reviewed  Eyes  Reviewed  Mouth  Reviewed  Skin  Reviewed  Nails  Reviewed     Diet Order:   Diet Order            Diet regular Room service appropriate? Yes; Fluid consistency: Thin  Diet effective now             EDUCATION NEEDS:  No education needs have been identified at this  time  Skin:  Skin Assessment: Reviewed RN Assessment  Last BM:  12/3  Height:  Ht Readings from Last 1 Encounters:  03/19/18 5\' 7"  (1.702 m)   Weight:  Wt Readings from Last 1 Encounters:  03/19/18 73.9 kg   Wt Readings from Last 10 Encounters:  03/19/18 73.9 kg  03/12/18 76.7 kg  03/06/18 79.4 kg  02/27/18 79.6 kg  02/21/18 77.7 kg  02/18/18 77.3 kg  02/11/18 77.7 kg  01/04/18 78 kg  12/31/17 78.1 kg  12/18/17 78.5 kg   Ideal Body Weight:  67.27 kg  BMI:  Body mass index is 25.52 kg/m.  Estimated Nutritional Needs:  Kcal:  1850-2050 kcals (25-27 kcal/kg bw) Protein:  89-103g Pro (1.2-1.4g/kg bw) Fluid:  >1.9 L fluid (25 ml/kg bw)  Burtis Junes RD, LDN, CNSC Clinical Nutrition Available Tues-Sat via Pager: [phone] 03/20/2018 3:11 PM

## 2018-03-20 NOTE — Plan of Care (Signed)
  Problem: Acute Rehab PT Goals(only PT should resolve) Goal: Pt Will Go Supine/Side To Sit Outcome: Progressing Flowsheets (Taken 03/20/2018 0917) Pt will go Supine/Side to Sit: with min guard assist Goal: Patient Will Transfer Sit To/From Stand Outcome: Progressing Flowsheets (Taken 03/20/2018 0917) Patient will transfer sit to/from stand: with min guard assist Goal: Pt Will Transfer Bed To Chair/Chair To Bed Outcome: Progressing Flowsheets (Taken 03/20/2018 0917) Pt will Transfer Bed to Chair/Chair to Bed: min guard assist Goal: Pt Will Ambulate Outcome: Progressing Flowsheets (Taken 03/20/2018 0917) Pt will Ambulate: 50 feet; with minimal assist; with rolling walker   9:18 AM, 03/20/18 Lonell Grandchild, MPT Physical Therapist with [facility] [phone] office [phone] mobile phone

## 2018-03-20 NOTE — Plan of Care (Signed)

## 2018-03-20 NOTE — Progress Notes (Signed)
Please dc Venetoclax per Dr Delton Coombes

## 2018-03-20 NOTE — Progress Notes (Signed)
PROGRESS NOTE  Omar Lee ELF:[id number] DOB: 1940/12/17 DOA: 03/19/2018 PCP: [name], MD  Brief History:   77 y.o. 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  Patient apparently fell out of bed last night, he is unsure what time he fell.  Reports generalized weakness, and subsequent frequent falls.   Patient states that he falls up to 10 times per week in the past month.  He denies any loss of consciousness.  He is only able to describe generalized weakness and a feeling of lightheadedness.  He denies any vomiting, diarrhea, abdominal pain, dysuria, hematuria.  There is no fevers or chills.  In the emergency department, the patient was noted to be pancytopenic with WBC 1.8, hemoglobin 9.5, platelets 65,000.  Orthostatic vital signs were positive from pulse increasing from 84 to 105.  Assessment/Plan: Gait instability/multiple falls -In part secondary to orthostasis and dehydration -PT recommends skilled nursing facility -02/27/2018 serum B12 585 -27/78/2423 folic acid 53.6 -14/43/1540 TSH 5.310 -Repeat TSH and free T4 -Urinalysis negative for pyuria -Urine drug screen positive only for opiates -check mag and CK  Dehydration -orthostatics positive -continue IVF another 24 hours  Cognitive impairment?? -Apparently, patient was recently in the emergency department 03/06/2018 -The patient exhibited some paranoid and agitated behavior -Patient followed up with PCP who consulted neurology in the outpatient setting -Patient will need neuropsychiatry evaluation from PsyD in the outpatient setting  Myelodysplastic syndrome/AML -Consult hematology -According to Dr. Tomie China last note, pt's [place] was improving and venetoclax was to be discontinued -pt finished recent decitabine on 02/22/18 -he is to follow up with hematology in 6 weeks from  that visit  Pancytopenia -due to [place] -monitor for signs of bleed -am CBC  Hypothyroidism -continue synthroid    Disposition Plan:   SNF 12/5 if stable Family Communication:  No Family at bedside  Consultants:  hematology  Code Status:  FULL DVT Prophylaxis:  SCDs   Procedures: As Listed in Progress Note Above  Antibiotics: None      Subjective: Patient denies fevers, chills, headache, chest pain, dyspnea, nausea, vomiting, diarrhea, abdominal pain, dysuria, hematuria, hematochezia, and melena.   Objective: Vitals:   03/19/18 1500 03/19/18 2307 03/19/18 2342 03/20/18 0542  BP: 110/80  (!) 104/59 130/69  Pulse: 76  83 75  Resp: 19  20 18   Temp:   97.9 F (36.6 C) 98.9 F (37.2 C)  TempSrc:   Oral Oral  SpO2: 100%  99% 98%  Weight:  73.9 kg    Height:  5\' 7"  (1.702 m)      Intake/Output Summary (Last 24 hours) at 03/20/2018 1813 Last data filed at 03/20/2018 0900 Gross per 24 hour  Intake 720 ml  Output 450 ml  Net 270 ml   Weight change:  Exam:   General:  Pt is alert, follows commands appropriately, not in acute distress  HEENT: No icterus, No thrush, No neck mass, [place]/AT  Cardiovascular: RRR, S1/S2, no rubs, no gallops  Respiratory: CTA bilaterally, no wheezing, no crackles, no rhonchi  Abdomen: Soft/+BS, non tender, non distended, no guarding  Extremities: No edema, No lymphangitis, No petechiae, No rashes, no synovitis   Data Reviewed: I have personally reviewed following labs and imaging studies Basic Metabolic Panel: Recent Labs  Lab 03/19/18 1215  NA 135  K 3.8  CL 104  CO2 24  GLUCOSE 94  BUN 15  CREATININE 1.09  CALCIUM 8.9   Liver Function Tests: Recent Labs  Lab 03/19/18 1215  AST 34  ALT 26  ALKPHOS 57  BILITOT 0.9  PROT 6.2*  ALBUMIN 3.9   No results for input(s): LIPASE, AMYLASE in the last 168 hours. No results for input(s): AMMONIA in the last 168 hours. Coagulation Profile: No results for input(s): INR,  PROTIME in the last 168 hours. CBC: Recent Labs  Lab 03/19/18 1215  WBC 1.8*  NEUTROABS 0.3*  HGB 9.5*  HCT 29.2*  MCV 103.2*  PLT 65*   Cardiac Enzymes: Recent Labs  Lab 03/19/18 1215  CKTOTAL 232  TROPONINI <0.03   BNP: Invalid input(s): POCBNP CBG: No results for input(s): GLUCAP in the last 168 hours. HbA1C: No results for input(s): HGBA1C in the last 72 hours. Urine analysis:    Component Value Date/Time   COLORURINE YELLOW 03/19/2018 [place] 03/19/2018 1246   LABSPEC 1.020 03/19/2018 1246   PHURINE 5.0 03/19/2018 1246   GLUCOSEU NEGATIVE 03/19/2018 1246   HGBUR NEGATIVE 03/19/2018 [place] 03/19/2018 1246   KETONESUR NEGATIVE 03/19/2018 1246   PROTEINUR NEGATIVE 03/19/2018 1246   NITRITE NEGATIVE 03/19/2018 1246   LEUKOCYTESUR NEGATIVE 03/19/2018 1246   Sepsis Labs: @LABRCNTIP (procalcitonin:4,lacticidven:4) ) Recent Results (from the past 240 hour(s))  Urine culture     Status: Abnormal (Preliminary result)   Collection Time: 03/19/18 12:46 PM  Result Value Ref Range Status   Specimen Description   Final    URINE, CLEAN CATCH Performed at [facility], [address], [place], [place] [address]    Special Requests   Final    NONE Performed at [facility], [address], [place], [place] [address]    Culture 50,000 COLONIES/mL STAPHYLOCOCCUS EPIDERMIDIS (A)  Final   Report Status PENDING  Incomplete     Scheduled Meds: . allopurinol  300 mg Oral Daily  . buPROPion  150 mg Oral BID  . feeding supplement (ENSURE ENLIVE)  237 mL Oral BID BM  . levothyroxine  125 mcg Oral Daily  . pantoprazole  40 mg Oral Daily   Continuous Infusions:  Procedures/Studies: Dg Chest 1 View  Result Date: 03/19/2018 CLINICAL DATA:  Found lying on the floor, weakness EXAM: CHEST  1 VIEW COMPARISON:  Portable chest x-ray of 11/02/2017 FINDINGS: No active infiltrate or effusion is seen. Mediastinal and hilar contours are stable and  mild cardiomegaly is stable. No acute bony abnormality seen, with the bones appearing somewhat osteopenic. IMPRESSION: Stable mild cardiomegaly.  No active lung disease. Electronically Signed   By: Ivar Drape M.D.   On: 03/19/2018 12:19   Dg Lumbar Spine Complete  Result Date: 03/19/2018 CLINICAL DATA:  Found on for following fall EXAM: LUMBAR SPINE - COMPLETE 4+ VIEW COMPARISON:  None. FINDINGS: Five lumbar type vertebral bodies are well visualized. Vertebral body height is well maintained. No pars defects are noted. No anterolisthesis is seen. Disc space narrowing is noted at L3-4 L4-5 and L5-S1 with associated osteophytes. No soft tissue abnormality is noted. IMPRESSION: Degenerative change without acute abnormality. Electronically Signed   By: Inez Catalina M.D.   On: 03/19/2018 12:21   Ct Head Wo Contrast  Result Date: 03/19/2018 CLINICAL DATA:  Family check to patient this morning, found lying on the floor for unknown amount of time EXAM: CT HEAD WITHOUT CONTRAST CT CERVICAL SPINE WITHOUT CONTRAST TECHNIQUE: Multidetector CT imaging  of the head and cervical spine was performed following the standard protocol without intravenous contrast. Multiplanar CT image reconstructions of the cervical spine were also generated. COMPARISON:  03/06/2018 FINDINGS: CT HEAD FINDINGS Brain: There is central and cortical atrophy. Periventricular white matter changes are consistent with small vessel disease. There is no intra or extra-axial fluid collection or mass lesion. The basilar cisterns and ventricles have a normal appearance. There is no CT evidence for acute infarction or hemorrhage. Vascular: There is minimal atherosclerotic calcification of the internal carotid arteries. No hyperdense vessels. Skull: Intact. A circumscribed partially sclerotic lesion in the frontal bone, to the RIGHT of midline measures 2.0 x 1.7 centimeters, focally expanding the frontal bone. Sinuses/Orbits: No acute finding. Other: None CT  CERVICAL SPINE FINDINGS Alignment: There is moderate degenerative change in the midcervical spine. Convex RIGHT scoliosis versus positioning. Skull base and vertebrae: No acute fracture. No primary bone lesion or focal pathologic process. Soft tissues and spinal canal: No prevertebral fluid or swelling. No visible canal hematoma. Disc levels: Moderate disc height loss and osteophytes at C4-5, C5-6, C6-7. Upper chest: Negative. Other: There is atherosclerotic calcification of the carotid arteries. IMPRESSION: 1.  No evidence for acute intracranial abnormality. 2. Atrophy and small vessel disease. 3. No acute cervical spine fracture. Moderate mid cervical spondylosis. 4. 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 No further evaluation is felt to be necessary. Electronically Signed   By: Nolon Nations M.D.   On: 03/19/2018 12:27   Ct Head Wo Contrast  Result Date: 03/06/2018 CLINICAL DATA:  Altered mentation baseline. EXAM: CT HEAD WITHOUT CONTRAST TECHNIQUE: Contiguous axial images were obtained from the base of the skull through the vertex without intravenous contrast. COMPARISON:  None. FINDINGS: Brain: Mild sulcal and moderate ventricular prominence consistent with superficial and central atrophy. Chronic mild-to-moderate small vessel ischemic disease is noted. No large vascular territory infarct, hemorrhage or midline shift. Intra-axial mass nor extra-axial fluid. Vascular: No hyperdense vessel sign. Atherosclerosis of carotid siphons. Skull: Intact Sinuses/Orbits: Nonacute Other: None IMPRESSION: Superficial and central atrophy with chronic small vessel ischemic disease. No acute intracranial abnormality. Electronically Signed   By: Ashley Royalty M.D.   On: 03/06/2018 22:18   Ct Cervical Spine Wo Contrast  Result Date: 03/19/2018 CLINICAL DATA:  Family check to patient this morning, found lying on the floor for unknown amount of time EXAM: CT HEAD WITHOUT CONTRAST CT  CERVICAL SPINE WITHOUT CONTRAST TECHNIQUE: Multidetector CT imaging of the head and cervical spine was performed following the standard protocol without intravenous contrast. Multiplanar CT image reconstructions of the cervical spine were also generated. COMPARISON:  03/06/2018 FINDINGS: CT HEAD FINDINGS Brain: There is central and cortical atrophy. Periventricular white matter changes are consistent with small vessel disease. There is no intra or extra-axial fluid collection or mass lesion. The basilar cisterns and ventricles have a normal appearance. There is no CT evidence for acute infarction or hemorrhage. Vascular: There is minimal atherosclerotic calcification of the internal carotid arteries. No hyperdense vessels. Skull: Intact. A circumscribed partially sclerotic lesion in the frontal bone, to the RIGHT of midline measures 2.0 x 1.7 centimeters, focally expanding the frontal bone. Sinuses/Orbits: No acute finding. Other: None CT CERVICAL SPINE FINDINGS Alignment: There is moderate degenerative change in the midcervical spine. Convex RIGHT scoliosis versus positioning. Skull base and vertebrae: No acute fracture. No primary bone lesion or focal pathologic process. Soft tissues and spinal canal: No prevertebral fluid or swelling. No visible canal hematoma. Disc  levels: Moderate disc height loss and osteophytes at C4-5, C5-6, C6-7. Upper chest: Negative. Other: There is atherosclerotic calcification of the carotid arteries. IMPRESSION: 1.  No evidence for acute intracranial abnormality. 2. Atrophy and small vessel disease. 3. No acute cervical spine fracture. Moderate mid cervical spondylosis. 4. 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 No further evaluation is felt to be necessary. Electronically Signed   By: Nolon Nations M.D.   On: 03/19/2018 12:27    Orson Eva, DO  [facility] Pager [phone]  If 7PM-7AM, please contact  night-coverage [email or web address] Password [email or web address] 03/20/2018, 6:13 PM   LOS: 0 days

## 2018-03-21 ENCOUNTER — Observation Stay (HOSPITAL_COMMUNITY): Payer: Medicare HMO

## 2018-03-21 DIAGNOSIS — R531 Weakness: Secondary | ICD-10-CM | POA: Diagnosis not present

## 2018-03-21 DIAGNOSIS — I951 Orthostatic hypotension: Secondary | ICD-10-CM | POA: Diagnosis not present

## 2018-03-21 DIAGNOSIS — R69 Illness, unspecified: Secondary | ICD-10-CM | POA: Diagnosis not present

## 2018-03-21 DIAGNOSIS — F0390 Unspecified dementia without behavioral disturbance: Secondary | ICD-10-CM

## 2018-03-21 DIAGNOSIS — Z7189 Other specified counseling: Secondary | ICD-10-CM | POA: Diagnosis not present

## 2018-03-21 DIAGNOSIS — D61818 Other pancytopenia: Secondary | ICD-10-CM | POA: Diagnosis not present

## 2018-03-21 DIAGNOSIS — S0990XA Unspecified injury of head, initial encounter: Secondary | ICD-10-CM | POA: Diagnosis not present

## 2018-03-21 DIAGNOSIS — E860 Dehydration: Secondary | ICD-10-CM | POA: Diagnosis not present

## 2018-03-21 DIAGNOSIS — C9200 Acute myeloblastic leukemia, not having achieved remission: Secondary | ICD-10-CM | POA: Diagnosis not present

## 2018-03-21 LAB — MAGNESIUM: Magnesium: 2.1 mg/dL (ref 1.7–2.4)

## 2018-03-21 LAB — CBC WITH DIFFERENTIAL/PLATELET
Abs Immature Granulocytes: 0.04 10*3/uL (ref 0.00–0.07)
Basophils Absolute: 0 10*3/uL (ref 0.0–0.1)
Basophils Relative: 0 %
Eosinophils Absolute: 0 10*3/uL (ref 0.0–0.5)
Eosinophils Relative: 0 %
HCT: 24.9 % — ABNORMAL LOW (ref 39.0–52.0)
Hemoglobin: 8.1 g/dL — ABNORMAL LOW (ref 13.0–17.0)
Immature Granulocytes: 1 %
Lymphocytes Relative: 36 %
Lymphs Abs: 1 10*3/uL (ref 0.7–4.0)
MCH: 34.2 pg — ABNORMAL HIGH (ref 26.0–34.0)
MCHC: 32.5 g/dL (ref 30.0–36.0)
MCV: 105.1 fL — ABNORMAL HIGH (ref 80.0–100.0)
Monocytes Absolute: 1.1 10*3/uL — ABNORMAL HIGH (ref 0.1–1.0)
Monocytes Relative: 37 %
Neutro Abs: 0.7 10*3/uL — ABNORMAL LOW (ref 1.7–7.7)
Neutrophils Relative %: 26 %
Platelets: 80 10*3/uL — ABNORMAL LOW (ref 150–400)
RBC: 2.37 MIL/uL — AB (ref 4.22–5.81)
RDW: 17.5 % — ABNORMAL HIGH (ref 11.5–15.5)
WBC: 2.9 10*3/uL — ABNORMAL LOW (ref 4.0–10.5)
nRBC: 3.1 % — ABNORMAL HIGH (ref 0.0–0.2)

## 2018-03-21 LAB — COMPREHENSIVE METABOLIC PANEL
ALT: 20 U/L (ref 0–44)
AST: 22 U/L (ref 15–41)
Albumin: 3.5 g/dL (ref 3.5–5.0)
Alkaline Phosphatase: 52 U/L (ref 38–126)
Anion gap: 7 (ref 5–15)
BUN: 16 mg/dL (ref 8–23)
CO2: 23 mmol/L (ref 22–32)
Calcium: 8.5 mg/dL — ABNORMAL LOW (ref 8.9–10.3)
Chloride: 106 mmol/L (ref 98–111)
Creatinine, Ser: 1.15 mg/dL (ref 0.61–1.24)
GFR calc Af Amer: >60 mL/min (ref >60)
GFR calc non Af Amer: >60 mL/min (ref >60)
Glucose, Bld: 102 mg/dL — ABNORMAL HIGH (ref 70–99)
Potassium: 4 mmol/L (ref 3.5–5.1)
SODIUM: 136 mmol/L (ref 135–145)
Total Bilirubin: 0.8 mg/dL (ref 0.3–1.2)
Total Protein: 5.6 g/dL — ABNORMAL LOW (ref 6.5–8.1)

## 2018-03-21 LAB — URINE CULTURE: Culture: 50000 — AB

## 2018-03-21 LAB — CK: Total CK: 47 U/L — ABNORMAL LOW (ref 49–397)

## 2018-03-21 LAB — GLUCOSE, CAPILLARY: Glucose-Capillary: 113 mg/dL — ABNORMAL HIGH (ref 70–99)
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
## 2018-03-21 NOTE — Clinical Social Work Placement (Signed)
   CLINICAL SOCIAL WORK PLACEMENT  NOTE  Date:  03/21/2018  Patient Details  Name: Omar Lee MRN: [id number] Date of Birth: [birth year]  Clinical Social Work is seeking post-discharge placement for this patient at the [place] level of care (*CSW will initial, date and re-position this form in  chart as items are completed):  Yes   Patient/family provided with [facility] Work Department's list of facilities offering this level of care within the geographic area requested by the patient (or if unable, by the patient's family).  Yes   Patient/family informed of their freedom to choose among providers that offer the needed level of care, that participate in Medicare, Medicaid or managed care program needed by the patient, have an available bed and are willing to accept the patient.  Yes   Patient/family informed of [facility]'s ownership interest in [facility] and [facility], as well as of the fact that they are under no obligation to receive care at these facilities.  PASRR submitted to EDS on 03/20/18     PASRR number received on 03/20/18     Existing PASRR number confirmed on       FL2 transmitted to all facilities in geographic area requested by pt/family on 03/21/18     FL2 transmitted to all facilities within larger geographic area on       Patient informed that his/her managed care company has contracts with or will negotiate with certain facilities, including the following:            Patient/family informed of bed offers received.  Patient chooses bed at       Physician recommends and patient chooses bed at      Patient to be transferred to   on  .  Patient to be transferred to facility by       Patient family notified on   of transfer.  Name of family member notified:        PHYSICIAN       Additional Comment: Discussed options with pt and friend Laurine Blazer, [phone].  Both express interest in [place]  placements.  Information sent to [facility], [place].   _______________________________________________ Trish Mage, LCSW 03/21/2018, 12:13 PM

## 2018-03-21 NOTE — Progress Notes (Signed)
PROGRESS NOTE  Omar Lee YTK:[id number] DOB: 01-Dec-1940 DOA: 03/19/2018 PCP: [name], MD  Brief History:  77 y.o.malewith medical history significantfordementia,hypothyroidism, AML whowas brought to the ED after he was found lying on the floor for anunknown periodof time. Patient apparently fell out of bed last night,he is unsure what time he fell. Reports generalized weakness,andsubsequent frequent falls.  Patient states that he falls up to 10 times per week in the past month.  He denies any loss of consciousness.  He is only able to describe generalized weakness and a feeling of lightheadedness.  He denies any vomiting, diarrhea, abdominal pain, dysuria, hematuria.  There is no fevers or chills.  In the emergency department, the patient was noted to be pancytopenic with WBC 1.8, hemoglobin 9.5, platelets 65,000.  Orthostatic vital signs were positive from pulse increasing from 84 to 105.  During his hospitalization, his work-up was essentially unremarkable.  He sustained another mechanical fall during the hospitalization on 1219.  Repeat CT of the brain showed no acute changes but showed ventriculomegaly.  The case was discussed with neurology about possible NPH.  They felt that the patient was stable for outpatient follow-up.  Assessment/Plan: Gait instability/multiple falls -In part secondary to orthostasis and dehydration -PT recommends skilled nursing facility -02/27/2018 serum B12 751 -70/04/7492 folic acid 49.6 -75/91/6384 TSH 5.310 -Repeat TSH and free T4 -Urinalysis negative for pyuria -Urine drug screen positive only for opiates -check mag--2.1 and CK--47 -03/21/18--CT brain--no acute findings--ventriculomegaly -discussed with neurology, Dr. Merlene Laughter about possibility of NPH-->pt is stable for outpt follow up  Dehydration -orthostatics positive -continued IVF another 24 hours  Cognitive impairment?? -Apparently, patient was recently  in the emergency department 03/06/2018 -The patient exhibited some paranoid and agitated behavior -Patient followed up with PCP who consulted neurology in the outpatient setting -Patient will need neuropsychiatry evaluation from PsyD in the outpatient setting  Myelodysplastic syndrome/AML -According to Dr. Tomie China last note, pt's [place] was improving and venetoclax was to be discontinued -pt finished recent decitabine on 02/22/18 -he is to follow up with hematology in 6 weeks from that visit -case discussed with Dr. 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    Objective: Vitals:   03/21/18 0500 03/21/18 0800 03/21/18 1316 03/21/18 1618  BP: (!) 104/58 118/62 137/65 130/61  Pulse: 82 81 89 77  Resp: 18 17 19 20   Temp: 97.9 F (36.6 C) 98.1 F (36.7 C) 98.4 F (36.9 C) 98.7 F (37.1 C)  TempSrc: Oral Oral Oral Oral  SpO2: 97% 97% 98% 98%  Weight:      Height:        Intake/Output Summary (Last 24 hours) at 03/21/2018 1834 Last data filed at 03/21/2018 1700 Gross per 24 hour  Intake 1134.81 ml  Output 1750 ml  Net -615.19 ml   Weight change:  Exam:   General:  Pt is alert, follows commands appropriately, not in acute distress  HEENT: No icterus, No thrush,  No neck mass, [place]/AT  Cardiovascular: RRR, S1/S2, no rubs, no gallops  Respiratory: CTA bilaterally, no wheezing, no crackles, no rhonchi  Abdomen: Soft/+BS, non tender,  non distended, no guarding  Extremities: No edema, No lymphangitis, No petechiae, No rashes, no synovitis   Data Reviewed: I have personally reviewed following labs and imaging studies Basic Metabolic Panel: Recent Labs  Lab 03/19/18 1215 03/21/18 0441  NA 135 136  K 3.8 4.0  CL 104 106  CO2 24 23  GLUCOSE 94 102*  BUN 15 16  CREATININE 1.09 1.15  CALCIUM 8.9 8.5*  MG  --  2.1   Liver Function Tests: Recent Labs  Lab 03/19/18 1215 03/21/18 0441  AST 34 22  ALT 26 20  ALKPHOS 57 52  BILITOT 0.9 0.8  PROT 6.2* 5.6*  ALBUMIN 3.9 3.5   No results for input(s): LIPASE, AMYLASE in the last 168 hours. No results for input(s): AMMONIA in the last 168 hours. Coagulation Profile: No results for input(s): INR, PROTIME in the last 168 hours. CBC: Recent Labs  Lab 03/19/18 1215 03/21/18 0441  WBC 1.8* 2.9*  NEUTROABS 0.3* 0.7*  HGB 9.5* 8.1*  HCT 29.2* 24.9*  MCV 103.2* 105.1*  PLT 65* 80*   Cardiac Enzymes: Recent Labs  Lab 03/19/18 1215 03/21/18 0441  CKTOTAL 232 47*  TROPONINI <0.03  --    BNP: Invalid input(s): POCBNP CBG: Recent Labs  Lab 03/21/18 0758  GLUCAP 113*   HbA1C: No results for input(s): HGBA1C in the last 72 hours. Urine analysis:    Component Value Date/Time   COLORURINE YELLOW 03/19/2018 [place] 03/19/2018 1246   LABSPEC 1.020 03/19/2018 1246   PHURINE 5.0 03/19/2018 1246   GLUCOSEU NEGATIVE 03/19/2018 1246   HGBUR NEGATIVE 03/19/2018 [place] 03/19/2018 1246   KETONESUR NEGATIVE 03/19/2018 1246   PROTEINUR NEGATIVE 03/19/2018 1246   NITRITE NEGATIVE 03/19/2018 1246   LEUKOCYTESUR NEGATIVE 03/19/2018 1246   Sepsis Labs: @LABRCNTIP (procalcitonin:4,lacticidven:4) ) Recent Results (from the past 240 hour(s))  Urine culture     Status: Abnormal   Collection Time: 03/19/18 12:46 PM  Result Value Ref Range Status   Specimen Description   Final    URINE, CLEAN CATCH Performed at [facility], [address], [place], [place] [address]    Special Requests   Final    NONE Performed at [facility], [address] [place], [place] [address]    Culture 50,000 COLONIES/mL STAPHYLOCOCCUS EPIDERMIDIS (A)  Final   Report Status 03/21/2018 FINAL  Final   Organism ID, Bacteria STAPHYLOCOCCUS EPIDERMIDIS (A)  Final      Susceptibility   Staphylococcus epidermidis - MIC*    CIPROFLOXACIN >=8 RESISTANT Resistant     GENTAMICIN 8 INTERMEDIATE Intermediate     NITROFURANTOIN <=16 SENSITIVE Sensitive     OXACILLIN >=4 RESISTANT Resistant     TETRACYCLINE >=16 RESISTANT Resistant     VANCOMYCIN 2 SENSITIVE Sensitive     TRIMETH/SULFA 160 RESISTANT Resistant     CLINDAMYCIN >=8 RESISTANT Resistant     RIFAMPIN 1 SENSITIVE Sensitive     Inducible Clindamycin NEGATIVE Sensitive     * 50,000 COLONIES/mL STAPHYLOCOCCUS EPIDERMIDIS     Scheduled Meds: . allopurinol  300 mg Oral Daily  . buPROPion  150 mg Oral BID  . feeding supplement (ENSURE ENLIVE)  237 mL Oral BID BM  . levothyroxine  125 mcg Oral Daily  . pantoprazole  40 mg Oral Daily  Continuous Infusions:  Procedures/Studies: Dg Chest 1 View  Result Date: 03/19/2018 CLINICAL DATA:  Found lying on the floor, weakness EXAM: CHEST  1 VIEW COMPARISON:  Portable chest x-ray of 11/02/2017 FINDINGS: No active infiltrate or effusion is seen. Mediastinal and hilar contours are stable and mild cardiomegaly is stable. No acute bony abnormality seen, with the bones appearing somewhat osteopenic. IMPRESSION: Stable mild cardiomegaly.  No active lung disease. Electronically Signed   By: Ivar Drape M.D.   On: 03/19/2018 12:19   Dg Lumbar Spine Complete  Result Date: 03/19/2018 CLINICAL DATA:  Found on for following fall EXAM: LUMBAR SPINE - COMPLETE 4+ VIEW COMPARISON:  None. FINDINGS: Five lumbar type vertebral bodies are well visualized. Vertebral body height is well maintained. No pars defects are noted. No anterolisthesis is seen. Disc  space narrowing is noted at L3-4 L4-5 and L5-S1 with associated osteophytes. No soft tissue abnormality is noted. IMPRESSION: Degenerative change without acute abnormality. Electronically Signed   By: Inez Catalina M.D.   On: 03/19/2018 12:21   Ct Head Wo Contrast  Result Date: 03/21/2018 CLINICAL DATA:  77 y/o  M; unwitnessed fall. EXAM: CT HEAD WITHOUT CONTRAST TECHNIQUE: Contiguous axial images were obtained from the base of the skull through the vertex without intravenous contrast. COMPARISON:  03/19/2018 and 11 20 CT head. FINDINGS: Brain: No evidence of acute infarction, hemorrhage, extra-axial collection or mass lesion/mass effect. Few stable subtle linear densities radiating out from the ventricles, probably developmental venous anomalies or other prominent vascular structures. Stable chronic microvascular ischemic changes and volume loss of the brain. Lateral and third ventriculomegaly (Evans index 0.46) with mild scalloping of the margins and relative crowding of sulci at the vertex. Vascular: Calcific atherosclerosis of carotid siphons. No hyperdense vessel identified. 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 No calvarial fracture. Sinuses/Orbits: Mild mucosal thickening of the left maxillary sinus. Additional visible paranasal sinuses and the mastoid air cells are normally aerated. Other: None. IMPRESSION: 1. No acute intracranial abnormality identified. 2. Lateral and third ventriculomegaly with features suggestive of normal pressure hydrocephalus. Clinical correlation recommended. 3. Stable chronic microvascular ischemic changes and volume loss of the brain. Electronically Signed   By: Kristine Garbe M.D.   On: 03/21/2018 15:08   Ct Head Wo Contrast  Result Date: 03/19/2018 CLINICAL DATA:  Family check to patient this morning, found lying on the floor for unknown amount of time EXAM: CT HEAD WITHOUT CONTRAST CT CERVICAL SPINE WITHOUT CONTRAST TECHNIQUE:  Multidetector CT imaging of the head and cervical spine was performed following the standard protocol without intravenous contrast. Multiplanar CT image reconstructions of the cervical spine were also generated. COMPARISON:  03/06/2018 FINDINGS: CT HEAD FINDINGS Brain: There is central and cortical atrophy. Periventricular white matter changes are consistent with small vessel disease. There is no intra or extra-axial fluid collection or mass lesion. The basilar cisterns and ventricles have a normal appearance. There is no CT evidence for acute infarction or hemorrhage. Vascular: There is minimal atherosclerotic calcification of the internal carotid arteries. No hyperdense vessels. Skull: Intact. A circumscribed partially sclerotic lesion in the frontal bone, to the RIGHT of midline measures 2.0 x 1.7 centimeters, focally expanding the frontal bone. Sinuses/Orbits: No acute finding. Other: None CT CERVICAL SPINE FINDINGS Alignment: There is moderate degenerative change in the midcervical spine. Convex RIGHT scoliosis versus positioning. Skull base and vertebrae: No acute fracture. No primary bone lesion or focal pathologic process. Soft tissues and spinal canal: No prevertebral fluid or  swelling. No visible canal hematoma. Disc levels: Moderate disc height loss and osteophytes at C4-5, C5-6, C6-7. Upper chest: Negative. Other: There is atherosclerotic calcification of the carotid arteries. IMPRESSION: 1.  No evidence for acute intracranial abnormality. 2. Atrophy and small vessel disease. 3. No acute cervical spine fracture. Moderate mid cervical spondylosis. 4. 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 No further evaluation is felt to be necessary. Electronically Signed   By: Nolon Nations M.D.   On: 03/19/2018 12:27   Ct Head Wo Contrast  Result Date: 03/06/2018 CLINICAL DATA:  Altered mentation baseline. EXAM: CT HEAD WITHOUT CONTRAST TECHNIQUE: Contiguous axial images  were obtained from the base of the skull through the vertex without intravenous contrast. COMPARISON:  None. FINDINGS: Brain: Mild sulcal and moderate ventricular prominence consistent with superficial and central atrophy. Chronic mild-to-moderate small vessel ischemic disease is noted. No large vascular territory infarct, hemorrhage or midline shift. Intra-axial mass nor extra-axial fluid. Vascular: No hyperdense vessel sign. Atherosclerosis of carotid siphons. Skull: Intact Sinuses/Orbits: Nonacute Other: None IMPRESSION: Superficial and central atrophy with chronic small vessel ischemic disease. No acute intracranial abnormality. Electronically Signed   By: Ashley Royalty M.D.   On: 03/06/2018 22:18   Ct Cervical Spine Wo Contrast  Result Date: 03/19/2018 CLINICAL DATA:  Family check to patient this morning, found lying on the floor for unknown amount of time EXAM: CT HEAD WITHOUT CONTRAST CT CERVICAL SPINE WITHOUT CONTRAST TECHNIQUE: Multidetector CT imaging of the head and cervical spine was performed following the standard protocol without intravenous contrast. Multiplanar CT image reconstructions of the cervical spine were also generated. COMPARISON:  03/06/2018 FINDINGS: CT HEAD FINDINGS Brain: There is central and cortical atrophy. Periventricular white matter changes are consistent with small vessel disease. There is no intra or extra-axial fluid collection or mass lesion. The basilar cisterns and ventricles have a normal appearance. There is no CT evidence for acute infarction or hemorrhage. Vascular: There is minimal atherosclerotic calcification of the internal carotid arteries. No hyperdense vessels. Skull: Intact. A circumscribed partially sclerotic lesion in the frontal bone, to the RIGHT of midline measures 2.0 x 1.7 centimeters, focally expanding the frontal bone. Sinuses/Orbits: No acute finding. Other: None CT CERVICAL SPINE FINDINGS Alignment: There is moderate degenerative change in the  midcervical spine. Convex RIGHT scoliosis versus positioning. Skull base and vertebrae: No acute fracture. No primary bone lesion or focal pathologic process. Soft tissues and spinal canal: No prevertebral fluid or swelling. No visible canal hematoma. Disc levels: Moderate disc height loss and osteophytes at C4-5, C5-6, C6-7. Upper chest: Negative. Other: There is atherosclerotic calcification of the carotid arteries. IMPRESSION: 1.  No evidence for acute intracranial abnormality. 2. Atrophy and small vessel disease. 3. No acute cervical spine fracture. Moderate mid cervical spondylosis. 4. 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 No further evaluation is felt to be necessary. Electronically Signed   By: Nolon Nations M.D.   On: 03/19/2018 12:27    Orson Eva, DO  [facility] Pager [phone]  If 7PM-7AM, please contact night-coverage [email or web address] Password [email or web address] 03/21/2018, 6:34 PM   LOS: 0 days

## 2018-03-21 NOTE — Progress Notes (Signed)
Pt was put on bedside commode by NT. As NT was coming back to check on patient, he was in the floor, he had apparently fallen off the bedside commode. Pt had all fall precautions in place, except he was left alone on the bedside commode. Vitals WNL. MD notified, will possibly order a head CT as staff is unsure if patient had hit their head. Family made aware. Patient is back in bed, all fall precauations put back in place. Attending nurse made aware.

## 2018-03-21 NOTE — NC FL2 (Signed)
[place] LEVEL OF CARE SCREENING TOOL     IDENTIFICATION  Patient Name: Omar Lee Birthdate: [birth year] Sex: male Admission Date (Current Location): 03/19/2018  [facility] and [place] Number:  [employer] and Address:  [facility] [address] [place]      Provider Number: [id number]  Attending Physician Name and Address:  Orson Eva, MD  Relative Name and Phone Number:       Current Level of Care: Hospital Recommended Level of Care: [place] Prior Approval Number:    Date Approved/Denied:   PASRR Number: [id number]  Discharge Plan: SNF    Current Diagnoses: Patient Active Problem List   Diagnosis Date Noted  . Dehydration 03/20/2018  . Orthostatic hypotension 03/20/2018  . Pancytopenia ([place]) 03/20/2018  . Generalized weakness   . Multiple falls 03/19/2018  . Dementia ([place]) 03/13/2018  . AML (acute myeloblastic leukemia) ([place]) 08/13/2017  . Goals of care, counseling/discussion 08/13/2017  . Short-term memory loss 06/17/2017  . 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 Anemia 07/21/2016  . GERD (gastroesophageal reflux disease) 01/20/2013  . Barrett's esophagus with esophagitis 01/20/2013  . Hypothyroidism 09/27/2012    Orientation RESPIRATION BLADDER Height & Weight     Self, Place  Normal Incontinent Weight: 73.9 kg Height:  5\' 7"  (170.2 cm)  BEHAVIORAL SYMPTOMS/MOOD NEUROLOGICAL BOWEL NUTRITION STATUS    (none) Continent Diet(Regular)  AMBULATORY STATUS COMMUNICATION OF NEEDS Skin   Extensive Assist Verbally Normal                       Personal Care Assistance Level of Assistance  Bathing, Feeding, Dressing Bathing Assistance: Limited assistance Feeding assistance: Independent Dressing Assistance: Limited assistance     Functional Limitations Info  Sight, Hearing, Speech Sight Info: Adequate Hearing Info: Adequate Speech Info: Adequate     SPECIAL CARE FACTORS FREQUENCY  PT (By licensed PT)     PT Frequency: 5X/W              Contractures Contractures Info: Not present    Additional Factors Info  Code Status, Allergies Code Status Info: full Allergies Info: NKA           Current Medications (03/21/2018):  This is the current hospital active medication list Current Facility-Administered Medications  Medication Dose Route Frequency Provider Last Rate Last Dose  . 0.9 % NaCl with KCl 20 mEq/ L  infusion   Intravenous Continuous Orson Eva, MD 75 mL/hr at 03/20/18 1928    . acetaminophen (TYLENOL) tablet 650 mg  650 mg Oral Q6H PRN Emokpae, Ejiroghene E, MD       Or  . acetaminophen (TYLENOL) suppository 650 mg  650 mg Rectal Q6H PRN Emokpae, Ejiroghene E, MD      . allopurinol (ZYLOPRIM) tablet 300 mg  300 mg Oral Daily Emokpae, Ejiroghene E, MD   300 mg at 03/21/18 0910  . buPROPion (WELLBUTRIN SR) 12 hr tablet 150 mg  150 mg Oral BID Emokpae, Ejiroghene E, MD   150 mg at 03/21/18 0910  . feeding supplement (ENSURE ENLIVE) (ENSURE ENLIVE) liquid 237 mL  237 mL Oral BID BM Emokpae, Ejiroghene E, MD   237 mL at 03/20/18 1600  . levothyroxine (SYNTHROID, LEVOTHROID) tablet 125 mcg  125 mcg Oral Daily Emokpae, Ejiroghene E, MD   125 mcg at 03/21/18 0502  . ondansetron (ZOFRAN) tablet 4 mg  4 mg Oral Q6H PRN Emokpae, Ejiroghene  E, MD       Or  . ondansetron (ZOFRAN) injection 4 mg  4 mg Intravenous Q6H PRN Emokpae, Ejiroghene E, MD      . pantoprazole (PROTONIX) EC tablet 40 mg  40 mg Oral Daily Emokpae, Ejiroghene E, MD   40 mg at 03/21/18 0910  . polyethylene glycol (MIRALAX / GLYCOLAX) packet 17 g  17 g Oral Daily PRN Emokpae, Ejiroghene E, MD         Discharge Medications: Please see discharge summary for a list of discharge medications.  Relevant Imaging Results:  Relevant Lab Results:   Additional [place], LCSW

## 2018-03-22 DIAGNOSIS — E860 Dehydration: Secondary | ICD-10-CM | POA: Diagnosis not present

## 2018-03-22 DIAGNOSIS — Z7189 Other specified counseling: Secondary | ICD-10-CM | POA: Diagnosis not present

## 2018-03-22 DIAGNOSIS — C9200 Acute myeloblastic leukemia, not having achieved remission: Secondary | ICD-10-CM | POA: Diagnosis not present

## 2018-03-22 DIAGNOSIS — F0391 Unspecified dementia with behavioral disturbance: Secondary | ICD-10-CM

## 2018-03-22 DIAGNOSIS — D61818 Other pancytopenia: Secondary | ICD-10-CM | POA: Diagnosis not present

## 2018-03-22 DIAGNOSIS — I951 Orthostatic hypotension: Secondary | ICD-10-CM | POA: Diagnosis not present

## 2018-03-22 DIAGNOSIS — R531 Weakness: Secondary | ICD-10-CM | POA: Diagnosis not present

## 2018-03-22 DIAGNOSIS — R69 Illness, unspecified: Secondary | ICD-10-CM | POA: Diagnosis not present

## 2018-03-22 LAB — URINALYSIS, COMPLETE (UACMP) WITH MICROSCOPIC
Bacteria, UA: NONE SEEN
Bilirubin Urine: NEGATIVE
Glucose, UA: NEGATIVE mg/dL
Hgb urine dipstick: NEGATIVE
KETONES UR: NEGATIVE mg/dL
Leukocytes, UA: NEGATIVE
NITRITE: NEGATIVE
Protein, ur: NEGATIVE mg/dL
Specific Gravity, Urine: 1.014 (ref 1.005–1.030)
pH: 7 (ref 5.0–8.0)

## 2018-03-22 LAB — CBC WITH DIFFERENTIAL/PLATELET
Abs Immature Granulocytes: 0.3 10*3/uL — ABNORMAL HIGH (ref 0.00–0.07)
Basophils Absolute: 0 10*3/uL (ref 0.0–0.1)
Basophils Relative: 0 %
Eosinophils Absolute: 0 10*3/uL (ref 0.0–0.5)
Eosinophils Relative: 0 %
HCT: 27.5 % — ABNORMAL LOW (ref 39.0–52.0)
Hemoglobin: 8.9 g/dL — ABNORMAL LOW (ref 13.0–17.0)
Immature Granulocytes: 10 %
Lymphocytes Relative: 28 %
Lymphs Abs: 0.9 10*3/uL (ref 0.7–4.0)
MCH: 33.6 pg (ref 26.0–34.0)
MCHC: 32.4 g/dL (ref 30.0–36.0)
MCV: 103.8 fL — ABNORMAL HIGH (ref 80.0–100.0)
Monocytes Absolute: 1.2 10*3/uL — ABNORMAL HIGH (ref 0.1–1.0)
Monocytes Relative: 40 %
Neutro Abs: 0.7 10*3/uL — ABNORMAL LOW (ref 1.7–7.7)
Neutrophils Relative %: 22 %
Platelets: 92 10*3/uL — ABNORMAL LOW (ref 150–400)
RBC: 2.65 MIL/uL — ABNORMAL LOW (ref 4.22–5.81)
RDW: 17.3 % — ABNORMAL HIGH (ref 11.5–15.5)
WBC: 3.1 10*3/uL — ABNORMAL LOW (ref 4.0–10.5)
nRBC: 2.3 % — ABNORMAL HIGH (ref 0.0–0.2)

## 2018-03-22 LAB — T4, FREE: Free T4: 0.72 ng/dL — ABNORMAL LOW (ref 0.82–1.77)

## 2018-03-22 LAB — TSH: TSH: 16.934 u[IU]/mL — ABNORMAL HIGH (ref 0.350–4.500)

## 2018-03-22 MED ORDER — HALOPERIDOL LACTATE 5 MG/ML IJ SOLN
5.0000 mg | Freq: Four times a day (QID) | INTRAMUSCULAR | Status: DC | PRN
  Administered 2018-03-22 – 2018-03-25 (×4): 5 mg via INTRAVENOUS
  Filled 2018-03-22 (×4): qty 1

## 2018-03-22 MED ORDER — LORAZEPAM 2 MG/ML IJ SOLN
0.5000 mg | Freq: Once | INTRAMUSCULAR | Status: AC
  Administered 2018-03-22: 0.5 mg via INTRAVENOUS
  Filled 2018-03-22: qty 1

## 2018-03-22 NOTE — Progress Notes (Signed)
PROGRESS NOTE  Omar Lee YPP:[id number] DOB: 10-Jul-1940 DOA: 03/19/2018 PCP: [name], MD  Brief History: 77 y.o.malewith medical history significantfordementia,hypothyroidism, AML whowas brought to the ED after he was found lying on the floor for anunknown periodof time. Patient apparently fell out of bed last night,he is unsure what time he fell. Reports generalized weakness,andsubsequent frequent falls.Patient states that he falls up to 10 times per week in the past month. He denies any loss of consciousness. He is only able to describe generalized weakness and a feeling of lightheadedness. He denies any vomiting, diarrhea, abdominal pain, dysuria, hematuria. There is no fevers or chills. In the emergency department, the patient was noted to be pancytopenic with WBC 1.8, hemoglobin 9.5, platelets 65,000. Orthostatic vital signs were positive from pulse increasing from 84 to 105.  During his hospitalization, his work-up was essentially unremarkable.  He sustained another mechanical fall during the hospitalization on 1219.  Repeat CT of the brain showed no acute changes but showed ventriculomegaly.  The case was discussed with neurology about possible NPH.  They felt that the patient was stable for outpatient follow-up.  Assessment/Plan: Gait instability/multiple falls -In part secondary to orthostasis and dehydration -PT recommends skilled nursing facility -02/27/2018 serum B12 458 -09/98/3382 folic acid 50.5 -39/76/7341 TSH 5.310 -Repeat TSH 16.934 -Free T4 0.72 -Urinalysis negative for pyuria -Urine drug screen positive only for opiates -check mag--2.1 and CK--47 -03/21/18--CT brain--no acute findings--ventriculomegaly -discussed with neurology, Dr. Merlene Laughter about possibility of NPH-->pt is stable for outpt follow up  Dehydration -orthostatics positive -pt received IVF -repeat orthostatics  Cognitive impairment?? -Apparently,  patient was recently in the emergency department 03/06/2018 -The patient exhibited some paranoid and agitated behavior -Patient followed up with PCP who consulted neurology in the outpatient setting -Patient will need neuropsychiatryevaluation from [facility] the outpatient setting -pt has had intermittent episodes of agitation -repeat UA/urine culture  Myelodysplastic syndrome/AML -According to Dr.Katragadda's last note, pt's ANC was improving and venetoclax was to be discontinued -pt finished recent decitabine on 02/22/18 -he is to follow up with hematology in 6 weeks from that visit -case discussed with Dr. 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  No f/c.  No headache.   Objective: Vitals:   03/21/18 1618 03/21/18 2222 03/22/18 0636 03/22/18 1352  BP: 130/61 (!) 120/47 134/72 138/76  Pulse: 77 78 84 82  Resp: 20     Temp: 98.7 F (37.1 C) 98.2 F (36.8 C) 98.5 F (36.9 C) 98.6 F (37 C)  TempSrc: Oral Oral Oral Oral  SpO2: 98% 98% 94% 96%  Weight:      Height:        Intake/Output Summary (Last 24 hours) at 03/22/2018 1735 Last data filed at 03/22/2018 1650 Gross per 24 hour  Intake 480 ml  Output 2 ml  Net 478 ml   Weight change:  Exam:   General:  Pt is alert, follows commands appropriately, not in acute distress  HEENT: No icterus, No thrush, No neck mass, [place]/AT  Cardiovascular: RRR,  S1/S2, no rubs, no gallops  Respiratory: bibasilar crackles, no wheeze  Abdomen: Soft/+BS, non  tender, non distended, no guarding  Extremities: No edema, No lymphangitis, No petechiae, No rashes, no synovitis   Data Reviewed: I have personally reviewed following labs and imaging studies Basic Metabolic Panel: Recent Labs  Lab 03/19/18 1215 03/21/18 0441  NA 135 136  K 3.8 4.0  CL 104 106  CO2 24 23  GLUCOSE 94 102*  BUN 15 16  CREATININE 1.09 1.15  CALCIUM 8.9 8.5*  MG  --  2.1   Liver Function Tests: Recent Labs  Lab 03/19/18 1215 03/21/18 0441  AST 34 22  ALT 26 20  ALKPHOS 57 52  BILITOT 0.9 0.8  PROT 6.2* 5.6*  ALBUMIN 3.9 3.5   No results for input(s): LIPASE, AMYLASE in the last 168 hours. No results for input(s): AMMONIA in the last 168 hours. Coagulation Profile: No results for input(s): INR, PROTIME in the last 168 hours. CBC: Recent Labs  Lab 03/19/18 1215 03/21/18 0441 03/22/18 0633  WBC 1.8* 2.9* 3.1*  NEUTROABS 0.3* 0.7* 0.7*  HGB 9.5* 8.1* 8.9*  HCT 29.2* 24.9* 27.5*  MCV 103.2* 105.1* 103.8*  PLT 65* 80* 92*   Cardiac Enzymes: Recent Labs  Lab 03/19/18 1215 03/21/18 0441  CKTOTAL 232 47*  TROPONINI <0.03  --    BNP: Invalid input(s): POCBNP CBG: Recent Labs  Lab 03/21/18 0758  GLUCAP 113*   HbA1C: No results for input(s): HGBA1C in the last 72 hours. Urine analysis:    Component Value Date/Time   COLORURINE YELLOW 03/19/2018 [place] 03/19/2018 1246   LABSPEC 1.020 03/19/2018 1246   PHURINE 5.0 03/19/2018 1246   GLUCOSEU NEGATIVE 03/19/2018 1246   HGBUR NEGATIVE 03/19/2018 [place] 03/19/2018 1246   KETONESUR NEGATIVE 03/19/2018 1246   PROTEINUR NEGATIVE 03/19/2018 1246   NITRITE NEGATIVE 03/19/2018 1246   LEUKOCYTESUR NEGATIVE 03/19/2018 1246   Sepsis Labs: @LABRCNTIP (procalcitonin:4,lacticidven:4) ) Recent Results (from the past 240 hour(s))  Urine culture     Status: Abnormal   Collection Time: 03/19/18 12:46 PM  Result Value Ref Range Status   Specimen Description    Final    URINE, CLEAN CATCH Performed at [facility], [address], [place], [place] [address]    Special Requests   Final    NONE Performed at [facility], [address] [place], [place] [address]    Culture 50,000 COLONIES/mL STAPHYLOCOCCUS EPIDERMIDIS (A)  Final   Report Status 03/21/2018 FINAL  Final   Organism ID, Bacteria STAPHYLOCOCCUS EPIDERMIDIS (A)  Final      Susceptibility   Staphylococcus epidermidis - MIC*    CIPROFLOXACIN >=8 RESISTANT Resistant     GENTAMICIN 8 INTERMEDIATE Intermediate     NITROFURANTOIN <=16 SENSITIVE Sensitive     OXACILLIN >=4 RESISTANT Resistant     TETRACYCLINE >=16 RESISTANT Resistant     VANCOMYCIN 2 SENSITIVE Sensitive     TRIMETH/SULFA 160 RESISTANT Resistant     CLINDAMYCIN >=8 RESISTANT Resistant     RIFAMPIN 1 SENSITIVE Sensitive     Inducible Clindamycin NEGATIVE Sensitive     * 50,000 COLONIES/mL STAPHYLOCOCCUS EPIDERMIDIS     Scheduled Meds: . allopurinol  300 mg Oral Daily  . buPROPion  150 mg Oral BID  . feeding supplement (ENSURE ENLIVE)  237 mL Oral BID BM  . levothyroxine  125 mcg Oral Daily  . pantoprazole  40 mg Oral Daily   Continuous Infusions:  Procedures/Studies: Dg Chest 1 View  Result Date: 03/19/2018 CLINICAL DATA:  Found lying on the floor, weakness EXAM: CHEST  1 VIEW COMPARISON:  Portable chest x-ray of 11/02/2017 FINDINGS: No active infiltrate or effusion is seen. Mediastinal and hilar contours are stable and mild cardiomegaly is stable. No acute bony abnormality seen, with the bones appearing somewhat osteopenic. IMPRESSION: Stable mild cardiomegaly.  No active lung disease. Electronically Signed   By: Ivar Drape M.D.   On: 03/19/2018 12:19   Dg Lumbar Spine Complete  Result Date: 03/19/2018 CLINICAL DATA:  Found on for following fall EXAM: LUMBAR SPINE - COMPLETE 4+ VIEW COMPARISON:  None. FINDINGS: Five lumbar type vertebral bodies are well visualized. Vertebral body height is well maintained. No pars  defects are noted. No anterolisthesis is seen. Disc space narrowing is noted at L3-4 L4-5 and L5-S1 with associated osteophytes. No soft tissue abnormality is noted. IMPRESSION: Degenerative change without acute abnormality. Electronically Signed   By: Inez Catalina M.D.   On: 03/19/2018 12:21   Ct Head Wo Contrast  Result Date: 03/21/2018 CLINICAL DATA:  77 y/o  M; unwitnessed fall. EXAM: CT HEAD WITHOUT CONTRAST TECHNIQUE: Contiguous axial images were obtained from the base of the skull through the vertex without intravenous contrast. COMPARISON:  03/19/2018 and 11 20 CT head. FINDINGS: Brain: No evidence of acute infarction, hemorrhage, extra-axial collection or mass lesion/mass effect. Few stable subtle linear densities radiating out from the ventricles, probably developmental venous anomalies or other prominent vascular structures. Stable chronic microvascular ischemic changes and volume loss of the brain. Lateral and third ventriculomegaly (Evans index 0.46) with mild scalloping of the margins and relative crowding of sulci at the vertex. Vascular: Calcific atherosclerosis of carotid siphons. No hyperdense vessel identified. 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 No calvarial fracture. Sinuses/Orbits: Mild mucosal thickening of the left maxillary sinus. Additional visible paranasal sinuses and the mastoid air cells are normally aerated. Other: None. IMPRESSION: 1. No acute intracranial abnormality identified. 2. Lateral and third ventriculomegaly with features suggestive of normal pressure hydrocephalus. Clinical correlation recommended. 3. Stable chronic microvascular ischemic changes and volume loss of the brain. Electronically Signed   By: Kristine Garbe M.D.   On: 03/21/2018 15:08   Ct Head Wo Contrast  Result Date: 03/19/2018 CLINICAL DATA:  Family check to patient this morning, found lying on the floor for unknown amount of time EXAM: CT HEAD WITHOUT CONTRAST CT  CERVICAL SPINE WITHOUT CONTRAST TECHNIQUE: Multidetector CT imaging of the head and cervical spine was performed following the standard protocol without intravenous contrast. Multiplanar CT image reconstructions of the cervical spine were also generated. COMPARISON:  03/06/2018 FINDINGS: CT HEAD FINDINGS Brain: There is central and cortical atrophy. Periventricular white matter changes are consistent with small vessel disease. There is no intra or extra-axial fluid collection or mass lesion. The basilar cisterns and ventricles have a normal appearance. There is no CT evidence for acute infarction or hemorrhage. Vascular: There is minimal atherosclerotic calcification of the internal carotid arteries. No hyperdense vessels. Skull: Intact. A circumscribed partially sclerotic lesion in the frontal bone, to the RIGHT of midline measures 2.0 x 1.7 centimeters, focally expanding the frontal bone. Sinuses/Orbits: No acute finding. Other: None CT CERVICAL SPINE FINDINGS Alignment: There is moderate degenerative change in the midcervical spine. Convex RIGHT scoliosis versus positioning. Skull base and vertebrae: No acute fracture. No primary bone lesion or focal pathologic process. Soft tissues and spinal canal: No prevertebral fluid or swelling. No visible  canal hematoma. Disc levels: Moderate disc height loss and osteophytes at C4-5, C5-6, C6-7. Upper chest: Negative. Other: There is atherosclerotic calcification of the carotid arteries. IMPRESSION: 1.  No evidence for acute intracranial abnormality. 2. Atrophy and small vessel disease. 3. No acute cervical spine fracture. Moderate mid cervical spondylosis. 4. 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 No further evaluation is felt to be necessary. Electronically Signed   By: Nolon Nations M.D.   On: 03/19/2018 12:27   Ct Head Wo Contrast  Result Date: 03/06/2018 CLINICAL DATA:  Altered mentation baseline. EXAM: CT HEAD WITHOUT  CONTRAST TECHNIQUE: Contiguous axial images were obtained from the base of the skull through the vertex without intravenous contrast. COMPARISON:  None. FINDINGS: Brain: Mild sulcal and moderate ventricular prominence consistent with superficial and central atrophy. Chronic mild-to-moderate small vessel ischemic disease is noted. No large vascular territory infarct, hemorrhage or midline shift. Intra-axial mass nor extra-axial fluid. Vascular: No hyperdense vessel sign. Atherosclerosis of carotid siphons. Skull: Intact Sinuses/Orbits: Nonacute Other: None IMPRESSION: Superficial and central atrophy with chronic small vessel ischemic disease. No acute intracranial abnormality. Electronically Signed   By: Ashley Royalty M.D.   On: 03/06/2018 22:18   Ct Cervical Spine Wo Contrast  Result Date: 03/19/2018 CLINICAL DATA:  Family check to patient this morning, found lying on the floor for unknown amount of time EXAM: CT HEAD WITHOUT CONTRAST CT CERVICAL SPINE WITHOUT CONTRAST TECHNIQUE: Multidetector CT imaging of the head and cervical spine was performed following the standard protocol without intravenous contrast. Multiplanar CT image reconstructions of the cervical spine were also generated. COMPARISON:  03/06/2018 FINDINGS: CT HEAD FINDINGS Brain: There is central and cortical atrophy. Periventricular white matter changes are consistent with small vessel disease. There is no intra or extra-axial fluid collection or mass lesion. The basilar cisterns and ventricles have a normal appearance. There is no CT evidence for acute infarction or hemorrhage. Vascular: There is minimal atherosclerotic calcification of the internal carotid arteries. No hyperdense vessels. Skull: Intact. A circumscribed partially sclerotic lesion in the frontal bone, to the RIGHT of midline measures 2.0 x 1.7 centimeters, focally expanding the frontal bone. Sinuses/Orbits: No acute finding. Other: None CT CERVICAL SPINE FINDINGS Alignment: There  is moderate degenerative change in the midcervical spine. Convex RIGHT scoliosis versus positioning. Skull base and vertebrae: No acute fracture. No primary bone lesion or focal pathologic process. Soft tissues and spinal canal: No prevertebral fluid or swelling. No visible canal hematoma. Disc levels: Moderate disc height loss and osteophytes at C4-5, C5-6, C6-7. Upper chest: Negative. Other: There is atherosclerotic calcification of the carotid arteries. IMPRESSION: 1.  No evidence for acute intracranial abnormality. 2. Atrophy and small vessel disease. 3. No acute cervical spine fracture. Moderate mid cervical spondylosis. 4. 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 No further evaluation is felt to be necessary. Electronically Signed   By: Nolon Nations M.D.   On: 03/19/2018 12:27    Orson Eva, DO  [facility] Pager [phone]  If 7PM-7AM, please contact night-coverage [email or web address] Password [email or web address] 03/22/2018, 5:35 PM   LOS: 0 days

## 2018-03-22 NOTE — Care Management (Signed)
Facility still awaiting auth.

## 2018-03-22 NOTE — Progress Notes (Signed)
Physical Therapy Treatment Patient Details Name: Omar Lee MRN: [id number] DOB: Apr 26, 1940 Today's Date: 03/22/2018    History of Present Illness Omar Lee is a 77 y.o. 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  Patient apparently fell out of bed last night, he is unsure what time he fell.  Reports generalized weakness, and subsequent frequent falls. He tells me he thinks he falls because he is on chemo.  No shortness of breath no cough, no chest pain, no burning with urination, no fevers or chills, no vomiting or loose stools, no abdominal pain.  He has tried to maintained good p.o. intake.    PT Comments    When PT entered room, patient had his feet off the far side of the bed stating he was trying to go to the bathroom. Speech was slurred and unclear. Bed mobility with moderate assistance, transfers with mod to max assist. Patient unable to advance feet for stand pivot transfer, forward ambulation nor side-stepping along bedside; max assist given. Patient was assisted back to bed. He utilized his UE with verbal and tactile cues and mod assist to boost up in bed. Moderate assistance for rolling onto bedpan. Nursing notified patient on bedpan at end of session.  Patient would continue to benefit from skilled physical therapy in current environment and next venue to continue return to prior function and increase strength, endurance, balance, coordination, and functional mobility and gait skills.     Follow Up Recommendations  SNF     Equipment Recommendations  None recommended by PT    Recommendations for Other Services       Precautions / Restrictions Precautions Precautions: Fall Restrictions Weight Bearing Restrictions: No    Mobility  Bed Mobility Overal bed mobility: Needs Assistance Bed Mobility: Supine to Sit     Supine to sit: Mod  assist     General bed mobility comments: slow labored movement  Transfers Overall transfer level: Needs assistance Equipment used: Rolling walker (2 wheeled) Transfers: Sit to/from Stand Sit to Stand: Mod assist;Max assist         General transfer comment: unable to take steps for ambulation or stand pivot transfer with max assistance  Ambulation/Gait Ambulation/Gait assistance: Max assist Gait Distance (Feet): 0 Feet Assistive device: Rolling walker (2 wheeled) Gait Pattern/deviations: Decreased step length - right;Decreased step length - left;Decreased stride length Gait velocity: unable   General Gait Details: unable to take steps for ambulation or stand pivot transfer with max assistance   Stairs             Wheelchair Mobility    Modified Rankin (Stroke Patients Only)       Balance Overall balance assessment: Needs assistance Sitting-balance support: Feet supported;No upper extremity supported Sitting balance-Leahy Scale: Fair Sitting balance - Comments: occasional leaning to the right Postural control: Right lateral lean Standing balance support: During functional activity;Bilateral upper extremity supported Standing balance-Leahy Scale: Poor Standing balance comment: fair/poor using RW                            Cognition Arousal/Alertness: Lethargic Behavior During Therapy: WFL for tasks assessed/performed Overall Cognitive Status: Impaired/Different from baseline  General Comments: nursing reports he has not been communicating as well today.      Exercises      General Comments        Pertinent Vitals/Pain Pain Assessment: No/denies pain    Home Living                      Prior Function            PT Goals (current goals can now be found in the care plan section) Acute Rehab PT Goals Patient Stated Goal: return home after rehab PT Goal Formulation: With  patient Time For Goal Achievement: 04/03/18 Potential to Achieve Goals: Good Progress towards PT goals: Not progressing toward goals - comment(patient appears to have had a decline in function; unsure of etiology.)    Frequency    Min 3X/week      PT Plan Current plan remains appropriate    Co-evaluation              AM-PAC PT "6 Clicks" Mobility   Outcome Measure  Help needed turning from your back to your side while in a flat bed without using bedrails?: Total Help needed moving from lying on your back to sitting on the side of a flat bed without using bedrails?: Total Help needed moving to and from a bed to a chair (including a wheelchair)?: Total Help needed standing up from a chair using your arms (e.g., wheelchair or bedside chair)?: Total Help needed to walk in hospital room?: Total Help needed climbing 3-5 steps with a railing? : Total 6 Click Score: 6    End of Session Equipment Utilized During Treatment: Gait belt Activity Tolerance: Patient limited by lethargy Patient left: with call bell/phone within reach;in bed;with bed alarm set;Other (comment)(patient on bedpan. nursing notified) Nurse Communication: Mobility status PT Visit Diagnosis: Unsteadiness on feet (R26.81);Other abnormalities of gait and mobility (R26.89);Muscle weakness (generalized) (M62.81)     Time: 1330-1350 PT Time Calculation (min) (ACUTE ONLY): 20 min  Charges:  $Therapeutic Activity: 8-22 mins                     Floria Raveling. Hartnett-Rands, MS, PT Per [place] [phone] 03/22/2018, 1:58 PM

## 2018-03-23 DIAGNOSIS — R69 Illness, unspecified: Secondary | ICD-10-CM | POA: Diagnosis not present

## 2018-03-23 DIAGNOSIS — D61818 Other pancytopenia: Secondary | ICD-10-CM | POA: Diagnosis not present

## 2018-03-23 DIAGNOSIS — Z7189 Other specified counseling: Secondary | ICD-10-CM | POA: Diagnosis not present

## 2018-03-23 DIAGNOSIS — I951 Orthostatic hypotension: Secondary | ICD-10-CM | POA: Diagnosis not present

## 2018-03-23 DIAGNOSIS — C9200 Acute myeloblastic leukemia, not having achieved remission: Secondary | ICD-10-CM | POA: Diagnosis not present

## 2018-03-23 DIAGNOSIS — R531 Weakness: Secondary | ICD-10-CM | POA: Diagnosis not present

## 2018-03-23 DIAGNOSIS — E860 Dehydration: Secondary | ICD-10-CM | POA: Diagnosis not present

## 2018-03-23 DIAGNOSIS — F0391 Unspecified dementia with behavioral disturbance: Secondary | ICD-10-CM | POA: Diagnosis not present

## 2018-03-23 LAB — BASIC METABOLIC PANEL
Anion gap: 8 (ref 5–15)
BUN: 16 mg/dL (ref 8–23)
CALCIUM: 8.8 mg/dL — AB (ref 8.9–10.3)
CO2: 23 mmol/L (ref 22–32)
CREATININE: 1.08 mg/dL (ref 0.61–1.24)
Chloride: 103 mmol/L (ref 98–111)
GFR calc Af Amer: >60 mL/min (ref >60)
GFR calc non Af Amer: >60 mL/min (ref >60)
Glucose, Bld: 123 mg/dL — ABNORMAL HIGH (ref 70–99)
Potassium: 3.8 mmol/L (ref 3.5–5.1)
Sodium: 134 mmol/L — ABNORMAL LOW (ref 135–145)

## 2018-03-23 LAB — CBC
HCT: 28 % — ABNORMAL LOW (ref 39.0–52.0)
Hemoglobin: 9.2 g/dL — ABNORMAL LOW (ref 13.0–17.0)
MCH: 34.1 pg — ABNORMAL HIGH (ref 26.0–34.0)
MCHC: 32.9 g/dL (ref 30.0–36.0)
MCV: 103.7 fL — ABNORMAL HIGH (ref 80.0–100.0)
Platelets: 110 10*3/uL — ABNORMAL LOW (ref 150–400)
RBC: 2.7 MIL/uL — ABNORMAL LOW (ref 4.22–5.81)
RDW: 17.3 % — AB (ref 11.5–15.5)
WBC: 3.2 10*3/uL — ABNORMAL LOW (ref 4.0–10.5)
nRBC: 3.8 % — ABNORMAL HIGH (ref 0.0–0.2)

## 2018-03-23 LAB — MAGNESIUM: Magnesium: 2.1 mg/dL (ref 1.7–2.4)

## 2018-03-23 MED ORDER — LEVOTHYROXINE SODIUM 75 MCG PO TABS
150.0000 ug | ORAL_TABLET | Freq: Every day | ORAL | Status: DC
  Administered 2018-03-24 – 2018-03-26 (×3): 150 ug via ORAL
  Filled 2018-03-23 (×3): qty 2

## 2018-03-23 NOTE — Progress Notes (Signed)
PROGRESS NOTE  Omar Lee XIP:[id number] DOB: Jan 05, 1941 DOA: 03/19/2018 PCP: [name], MD  Brief History: 77 y.o.malewith medical history significantfordementia,hypothyroidism, AML whowas brought to the ED after he was found lying on the floor for anunknown periodof time. Patient apparently fell out of bed last night,he is unsure what time he fell. Reports generalized weakness,andsubsequent frequent falls.Patient states that he falls up to 10 times per week in the past month. He denies any loss of consciousness. He is only able to describe generalized weakness and a feeling of lightheadedness. He denies any vomiting, diarrhea, abdominal pain, dysuria, hematuria. There is no fevers or chills. In the emergency department, the patient was noted to be pancytopenic with WBC 1.8, hemoglobin 9.5, platelets 65,000. Orthostatic vital signs were positive from pulse increasing from 84 to 105.During his hospitalization, his work-up was essentially unremarkable. He sustained another mechanical fall during the hospitalization on 1219. Repeat CT of the brain showed no acute changes but showed ventriculomegaly. The case was discussed with neurology about possible NPH. They felt that the patient was stable for outpatient follow-up.  Assessment/Plan: Gait instability/multiple falls -In part secondary to orthostasis and dehydration -PT recommends skilled nursing facility -02/27/2018 serum B12 673 -41/93/7902 folic acid 40.9 -73/53/2992 TSH 5.310 -Repeat TSH 16.934 -Free T4 0.72 -Urinalysis negative for pyuria -Urine drug screen positive only for opiates -check mag--2.1and CK--47 -03/21/18--CT brain--no acute findings--ventriculomegaly -discussed with neurology, Dr. Merlene Laughter about possibility of NPH-->pt is stable for outpt follow up  Dehydration -orthostatics positive -pt received IVF -repeat orthostatics  Cognitive impairment?? -Apparently,  patient was recently in the emergency department 03/06/2018 -The patient exhibited some paranoid and agitated behavior -Patient followed up with PCP who consulted neurology in the outpatient setting -Patient will need neuropsychiatryevaluation from [facility] the outpatient setting -pt has had intermittent episodes of agitation-->prn haldol -repeat UA--no pyuria  Myelodysplastic syndrome/AML -According to Dr.Katragadda's last note, pt's ANC was improving and venetoclax was to be discontinued -pt finished recent decitabine on 02/22/18 -he is to follow up with hematology in 6 weeks from that visit -case discussed with Dr. 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 -monitor for signs of bleed -am CBC  Hypothyroidism -Repeat TSH 16.934 -Free T4 0.72 -increase synthroid to 150 mcg    Disposition Plan:SNF 12/6if bed available Family Communication:NoFamily at bedside  Consultants:hematology  Code Status: FULL DVT Prophylaxis: SCDs   Procedures: As Listed in Progress Note Above  Antibiotics: None       Subjective: Patient denies fevers, chills, headache, chest pain, dyspnea, nausea, vomiting, diarrhea, abdominal pain, dysuria  Objective: Vitals:   03/22/18 1352 03/22/18 2136 03/23/18 0522 03/23/18 1300  BP: 138/76 119/67 124/66 (!) 121/52  Pulse: 82 93 83 81  Resp:  18 18 18   Temp: 98.6 F (37 C) 97.8 F (36.6 C) 97.8 F (36.6 C) 98.4 F (36.9 C)  TempSrc: Oral Oral Oral Oral  SpO2: 96% 98% 97% 98%  Weight:      Height:        Intake/Output Summary (Last 24 hours) at 03/23/2018 1713 Last data filed at 03/23/2018 1300 Gross per 24 hour  Intake 1061 ml  Output 350 ml  Net 711 ml   Weight change:  Exam:   General:  Pt is alert, follows commands appropriately, not in acute distress  HEENT: No icterus, No thrush, No neck mass, [place]/AT  Cardiovascular: RRR, S1/S2, no rubs, no  gallops  Respiratory: CTA bilaterally, no wheezing, no crackles, no rhonchi  Abdomen: Soft/+BS, non tender, non distended, no guarding  Extremities: No edema, No lymphangitis, No petechiae, No rashes, no synovitis   Data Reviewed: I have personally reviewed following labs and imaging studies Basic Metabolic Panel: Recent Labs  Lab 03/19/18 1215 03/21/18 0441 03/23/18 0836  NA 135 136 134*  K 3.8 4.0 3.8  CL 104 106 103  CO2 24 23 23   GLUCOSE 94 102* 123*  BUN 15 16 16   CREATININE 1.09 1.15 1.08  CALCIUM 8.9 8.5* 8.8*  MG  --  2.1 2.1   Liver Function Tests: Recent Labs  Lab 03/19/18 1215 03/21/18 0441  AST 34 22  ALT 26 20  ALKPHOS 57 52  BILITOT 0.9 0.8  PROT 6.2* 5.6*  ALBUMIN 3.9 3.5   No results for input(s): LIPASE, AMYLASE in the last 168 hours. No results for input(s): AMMONIA in the last 168 hours. Coagulation Profile: No results for input(s): INR, PROTIME in the last 168 hours. CBC: Recent Labs  Lab 03/19/18 1215 03/21/18 0441 03/22/18 0633 03/23/18 0836  WBC 1.8* 2.9* 3.1* 3.2*  NEUTROABS 0.3* 0.7* 0.7*  --   HGB 9.5* 8.1* 8.9* 9.2*  HCT 29.2* 24.9* 27.5* 28.0*  MCV 103.2* 105.1* 103.8* 103.7*  PLT 65* 80* 92* 110*   Cardiac Enzymes: Recent Labs  Lab 03/19/18 1215 03/21/18 0441  CKTOTAL 232 47*  TROPONINI <0.03  --    BNP: Invalid input(s): POCBNP CBG: Recent Labs  Lab 03/21/18 0758  GLUCAP 113*   HbA1C: No results for input(s): HGBA1C in the last 72 hours. Urine analysis:    Component Value Date/Time   COLORURINE YELLOW 03/22/2018 1930   APPEARANCEUR CLEAR 03/22/2018 1930   LABSPEC 1.014 03/22/2018 1930   PHURINE 7.0 03/22/2018 1930   GLUCOSEU NEGATIVE 03/22/2018 1930   HGBUR NEGATIVE 03/22/2018 1930   BILIRUBINUR NEGATIVE 03/22/2018 1930   KETONESUR NEGATIVE 03/22/2018 1930   PROTEINUR NEGATIVE 03/22/2018 1930   NITRITE NEGATIVE 03/22/2018 1930   LEUKOCYTESUR NEGATIVE 03/22/2018 1930   Sepsis  Labs: @LABRCNTIP (procalcitonin:4,lacticidven:4) ) Recent Results (from the past 240 hour(s))  Urine culture     Status: Abnormal   Collection Time: 03/19/18 12:46 PM  Result Value Ref Range Status   Specimen Description   Final    URINE, CLEAN CATCH Performed at [facility], [address], [place], [place] [address]    Special Requests   Final    NONE Performed at [facility], [address], [place], [place] [address]    Culture 50,000 COLONIES/mL STAPHYLOCOCCUS EPIDERMIDIS (A)  Final   Report Status 03/21/2018 FINAL  Final   Organism ID, Bacteria STAPHYLOCOCCUS EPIDERMIDIS (A)  Final      Susceptibility   Staphylococcus epidermidis - MIC*    CIPROFLOXACIN >=8 RESISTANT Resistant     GENTAMICIN 8 INTERMEDIATE Intermediate     NITROFURANTOIN <=16 SENSITIVE Sensitive     OXACILLIN >=4 RESISTANT Resistant     TETRACYCLINE >=16 RESISTANT Resistant     VANCOMYCIN 2 SENSITIVE Sensitive     TRIMETH/SULFA 160 RESISTANT Resistant     CLINDAMYCIN >=8 RESISTANT Resistant     RIFAMPIN 1 SENSITIVE Sensitive     Inducible Clindamycin NEGATIVE Sensitive     * 50,000 COLONIES/mL STAPHYLOCOCCUS EPIDERMIDIS     Scheduled Meds: . allopurinol  300 mg Oral Daily  . buPROPion  150 mg Oral BID  . feeding supplement (ENSURE ENLIVE)  237 mL Oral BID BM  . Derrill Memo  ON 03/24/2018] levothyroxine  150 mcg Oral Daily  . pantoprazole  40 mg Oral Daily   Continuous Infusions:  Procedures/Studies: Dg Chest 1 View  Result Date: 03/19/2018 CLINICAL DATA:  Found lying on the floor, weakness EXAM: CHEST  1 VIEW COMPARISON:  Portable chest x-ray of 11/02/2017 FINDINGS: No active infiltrate or effusion is seen. Mediastinal and hilar contours are stable and mild cardiomegaly is stable. No acute bony abnormality seen, with the bones appearing somewhat osteopenic. IMPRESSION: Stable mild cardiomegaly.  No active lung disease. Electronically Signed   By: Ivar Drape M.D.   On: 03/19/2018 12:19   Dg Lumbar Spine  Complete  Result Date: 03/19/2018 CLINICAL DATA:  Found on for following fall EXAM: LUMBAR SPINE - COMPLETE 4+ VIEW COMPARISON:  None. FINDINGS: Five lumbar type vertebral bodies are well visualized. Vertebral body height is well maintained. No pars defects are noted. No anterolisthesis is seen. Disc space narrowing is noted at L3-4 L4-5 and L5-S1 with associated osteophytes. No soft tissue abnormality is noted. IMPRESSION: Degenerative change without acute abnormality. Electronically Signed   By: Inez Catalina M.D.   On: 03/19/2018 12:21   Ct Head Wo Contrast  Result Date: 03/21/2018 CLINICAL DATA:  77 y/o  M; unwitnessed fall. EXAM: CT HEAD WITHOUT CONTRAST TECHNIQUE: Contiguous axial images were obtained from the base of the skull through the vertex without intravenous contrast. COMPARISON:  03/19/2018 and 11 20 CT head. FINDINGS: Brain: No evidence of acute infarction, hemorrhage, extra-axial collection or mass lesion/mass effect. Few stable subtle linear densities radiating out from the ventricles, probably developmental venous anomalies or other prominent vascular structures. Stable chronic microvascular ischemic changes and volume loss of the brain. Lateral and third ventriculomegaly (Evans index 0.46) with mild scalloping of the margins and relative crowding of sulci at the vertex. Vascular: Calcific atherosclerosis of carotid siphons. No hyperdense vessel identified. 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 No calvarial fracture. Sinuses/Orbits: Mild mucosal thickening of the left maxillary sinus. Additional visible paranasal sinuses and the mastoid air cells are normally aerated. Other: None. IMPRESSION: 1. No acute intracranial abnormality identified. 2. Lateral and third ventriculomegaly with features suggestive of normal pressure hydrocephalus. Clinical correlation recommended. 3. Stable chronic microvascular ischemic changes and volume loss of the brain. Electronically Signed    By: Kristine Garbe M.D.   On: 03/21/2018 15:08   Ct Head Wo Contrast  Result Date: 03/19/2018 CLINICAL DATA:  Family check to patient this morning, found lying on the floor for unknown amount of time EXAM: CT HEAD WITHOUT CONTRAST CT CERVICAL SPINE WITHOUT CONTRAST TECHNIQUE: Multidetector CT imaging of the head and cervical spine was performed following the standard protocol without intravenous contrast. Multiplanar CT image reconstructions of the cervical spine were also generated. COMPARISON:  03/06/2018 FINDINGS: CT HEAD FINDINGS Brain: There is central and cortical atrophy. Periventricular white matter changes are consistent with small vessel disease. There is no intra or extra-axial fluid collection or mass lesion. The basilar cisterns and ventricles have a normal appearance. There is no CT evidence for acute infarction or hemorrhage. Vascular: There is minimal atherosclerotic calcification of the internal carotid arteries. No hyperdense vessels. Skull: Intact. A circumscribed partially sclerotic lesion in the frontal bone, to the RIGHT of midline measures 2.0 x 1.7 centimeters, focally expanding the frontal bone. Sinuses/Orbits: No acute finding. Other: None CT CERVICAL SPINE FINDINGS Alignment: There is moderate degenerative change in the midcervical spine. Convex RIGHT scoliosis versus positioning. Skull base and vertebrae: No acute  fracture. No primary bone lesion or focal pathologic process. Soft tissues and spinal canal: No prevertebral fluid or swelling. No visible canal hematoma. Disc levels: Moderate disc height loss and osteophytes at C4-5, C5-6, C6-7. Upper chest: Negative. Other: There is atherosclerotic calcification of the carotid arteries. IMPRESSION: 1.  No evidence for acute intracranial abnormality. 2. Atrophy and small vessel disease. 3. No acute cervical spine fracture. Moderate mid cervical spondylosis. 4. 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 No further evaluation is felt to be necessary. Electronically Signed   By: Nolon Nations M.D.   On: 03/19/2018 12:27   Ct Head Wo Contrast  Result Date: 03/06/2018 CLINICAL DATA:  Altered mentation baseline. EXAM: CT HEAD WITHOUT CONTRAST TECHNIQUE: Contiguous axial images were obtained from the base of the skull through the vertex without intravenous contrast. COMPARISON:  None. FINDINGS: Brain: Mild sulcal and moderate ventricular prominence consistent with superficial and central atrophy. Chronic mild-to-moderate small vessel ischemic disease is noted. No large vascular territory infarct, hemorrhage or midline shift. Intra-axial mass nor extra-axial fluid. Vascular: No hyperdense vessel sign. Atherosclerosis of carotid siphons. Skull: Intact Sinuses/Orbits: Nonacute Other: None IMPRESSION: Superficial and central atrophy with chronic small vessel ischemic disease. No acute intracranial abnormality. Electronically Signed   By: Ashley Royalty M.D.   On: 03/06/2018 22:18   Ct Cervical Spine Wo Contrast  Result Date: 03/19/2018 CLINICAL DATA:  Family check to patient this morning, found lying on the floor for unknown amount of time EXAM: CT HEAD WITHOUT CONTRAST CT CERVICAL SPINE WITHOUT CONTRAST TECHNIQUE: Multidetector CT imaging of the head and cervical spine was performed following the standard protocol without intravenous contrast. Multiplanar CT image reconstructions of the cervical spine were also generated. COMPARISON:  03/06/2018 FINDINGS: CT HEAD FINDINGS Brain: There is central and cortical atrophy. Periventricular white matter changes are consistent with small vessel disease. There is no intra or extra-axial fluid collection or mass lesion. The basilar cisterns and ventricles have a normal appearance. There is no CT evidence for acute infarction or hemorrhage. Vascular: There is minimal atherosclerotic calcification of the internal carotid arteries. No hyperdense vessels. Skull:  Intact. A circumscribed partially sclerotic lesion in the frontal bone, to the RIGHT of midline measures 2.0 x 1.7 centimeters, focally expanding the frontal bone. Sinuses/Orbits: No acute finding. Other: None CT CERVICAL SPINE FINDINGS Alignment: There is moderate degenerative change in the midcervical spine. Convex RIGHT scoliosis versus positioning. Skull base and vertebrae: No acute fracture. No primary bone lesion or focal pathologic process. Soft tissues and spinal canal: No prevertebral fluid or swelling. No visible canal hematoma. Disc levels: Moderate disc height loss and osteophytes at C4-5, C5-6, C6-7. Upper chest: Negative. Other: There is atherosclerotic calcification of the carotid arteries. IMPRESSION: 1.  No evidence for acute intracranial abnormality. 2. Atrophy and small vessel disease. 3. No acute cervical spine fracture. Moderate mid cervical spondylosis. 4. 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 No further evaluation is felt to be necessary. Electronically Signed   By: Nolon Nations M.D.   On: 03/19/2018 12:27    Orson Eva, DO  [facility] Pager [phone]  If 7PM-7AM, please contact night-coverage [email or web address] Password [email or web address] 03/23/2018, 5:13 PM   LOS: 0 days

## 2018-03-24 DIAGNOSIS — E860 Dehydration: Secondary | ICD-10-CM | POA: Diagnosis not present

## 2018-03-24 DIAGNOSIS — R531 Weakness: Secondary | ICD-10-CM | POA: Diagnosis not present

## 2018-03-24 DIAGNOSIS — I951 Orthostatic hypotension: Secondary | ICD-10-CM | POA: Diagnosis not present

## 2018-03-24 DIAGNOSIS — C9200 Acute myeloblastic leukemia, not having achieved remission: Secondary | ICD-10-CM | POA: Diagnosis not present

## 2018-03-24 DIAGNOSIS — Z7189 Other specified counseling: Secondary | ICD-10-CM | POA: Diagnosis not present

## 2018-03-24 DIAGNOSIS — R69 Illness, unspecified: Secondary | ICD-10-CM | POA: Diagnosis not present

## 2018-03-24 DIAGNOSIS — D61818 Other pancytopenia: Secondary | ICD-10-CM | POA: Diagnosis not present

## 2018-03-24 DIAGNOSIS — F0391 Unspecified dementia with behavioral disturbance: Secondary | ICD-10-CM | POA: Diagnosis not present

## 2018-03-24 LAB — URINE CULTURE: Culture: <10000 — AB

## 2018-03-24 MED ORDER — SODIUM CHLORIDE 0.9 % IV BOLUS
500.0000 mL | Freq: Once | INTRAVENOUS | Status: AC
  Administered 2018-03-24: 500 mL via INTRAVENOUS

## 2018-03-24 MED ORDER — POTASSIUM CHLORIDE IN NACL 20-0.9 MEQ/L-% IV SOLN
INTRAVENOUS | Status: AC
  Administered 2018-03-24: 16:00:00 via INTRAVENOUS

## 2018-03-24 MED ORDER — LEVOTHYROXINE SODIUM 150 MCG PO TABS: 150.0000 ug | ORAL_TABLET | Freq: Every day | ORAL | 1 refills | Status: AC

## 2018-03-24 MED ORDER — ENSURE ENLIVE PO LIQD: 237.0000 mL | Freq: Two times a day (BID) | ORAL | 12 refills | Status: AC

## 2018-03-24 NOTE — Progress Notes (Signed)
This RN with the assistance of 2NTs were unable to complete a full set of orthostatic VS as the patient was unable to stand. MD made aware and this RN documented VS while lying and sitting.

## 2018-03-24 NOTE — Progress Notes (Signed)
PROGRESS NOTE  [name] ERD:[id number] DOB: [birth year] DOA: 03/19/2018 PCP: [name], MD  Brief History: 77 y.o.malewith medical history significantfordementia,hypothyroidism, AML whowas brought to the ED after he was found lying on the floor for anunknown periodof time. Patient apparently fell out of bed last night,he is unsure what time he fell. Reports generalized weakness,andsubsequent frequent falls.Patient states that he falls up to 10 times per week in the past month. He denies any loss of consciousness. He is only able to describe generalized weakness and a feeling of lightheadedness. He denies any vomiting, diarrhea, abdominal pain, dysuria, hematuria. There is no fevers or chills. In the emergency department, the patient was noted to be pancytopenic with WBC 1.8, hemoglobin 9.5, platelets 65,000. Orthostatic vital signs were positive from pulse increasing from 84 to 105.During his hospitalization, his work-up was essentially unremarkable. He sustained another mechanical fall during the hospitalization on 1219. Repeat CT of the brain showed no acute changes but showed ventriculomegaly. The case was discussed with neurology about possible NPH. They felt that the patient was stable for outpatient follow-up. During the hospitalization, the patient experienced hospital delirium requiring intermittent medications to calm him down.  Work up for reversible causes were essentially neg.    Assessment/Plan: Gait instability/multiple falls -In part secondary to orthostasis and dehydration -PT recommends skilled nursing facility -02/27/2018 serum B12 563 -14/97/0263 folic acid 78.5 -88/50/2774 TSH 5.310 -Repeat JOI78.676 -Free T4 0.72 -Urinalysis negative for pyuria -Urine drug screen positive only for opiates -check mag--2.1and CK--47 -03/21/18--CT brain--no acute findings--ventriculomegaly -discussed with neurology, Dr. Merlene Laughter about  possibility of NPH-->pt is stable for outpt follow up  Dehydration -orthostatics positive -give another 24 hours of IVF  Cognitive impairment -Apparently, patient was recently in the emergency department 03/06/2018 -The patient exhibited some paranoid and agitated behavior -Patient followed up with PCP who consulted neurology in the outpatient setting -Patient will need neuropsychiatryevaluation from [facility] the outpatient setting -pt has had intermittent episodes of agitation-->prn haldol -repeat UA--no pyuria  Myelodysplastic syndrome/AML -According to Dr.Katragadda's last note, pt's ANC was improving and venetoclax was to be discontinued -pt finished recent decitabine on 02/22/18 -he is to follow up with hematology in 6 weeks from that visit -case discussed with Dr. 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 to assist with his care and decision making when he is unable to do so -pt desires to be DNR which is confirmed with Ms Ardis Hughs Advance care planning, including the explanation and discussion of advance directives was carried out with the patient and family.  Code status including explanations of "Full Code" and "DNR" and alternatives were discussed in detail.  Discussion of end-of-life issues including but not limited palliative care, hospice care and the concept of hospice, other end-of-life care options, power of attorney for health care decisions, living wills, and physician orders for life-sustaining treatment were also discussed with the patient and family.  Total face to face time 16 minutes.     Disposition Plan:SNF 12/6if bed  available Family Communication:updated [name] 12/8--Total time spent 35 minutes.  Greater than 50% spent face to face counseling and coordinating care.   Consultants:hematology  Code Status:  FULL DVT Prophylaxis: SCDs   Procedures: As Listed in Progress Note Above  Antibiotics: None     Subjective: Patient denies fevers, chills, headache, chest pain, dyspnea, nausea, vomiting, diarrhea, abdominal pain, dysuria  Objective: Vitals:   03/23/18 1300 03/23/18 2051 03/24/18 0558 03/24/18 1400  BP: (!) 121/52 128/61 (!) 112/55 139/70  Pulse: 81 84 (!) 109 98  Resp: 18 20 18 18   Temp: 98.4 F (36.9 C) 98.3 F (36.8 C) 97.9 F (36.6 C) 98.6 F (37 C)  TempSrc: Oral Oral Oral Oral  SpO2: 98% 97% 96% 96%  Weight:      Height:        Intake/Output Summary (Last 24 hours) at 03/24/2018 1608 Last data filed at 03/24/2018 0900 Gross per 24 hour  Intake 360 ml  Output 500 ml  Net -140 ml   Weight change:  Exam:   General:  Pt is alert, follows commands appropriately, not in acute distress  HEENT: No icterus, No thrush, No neck mass, [place]/AT  Cardiovascular: RRR, S1/S2, no rubs, no gallops  Respiratory: fine bibasilar crackles, no wheeze  Abdomen: Soft/+BS, non tender, non distended, no guarding  Extremities: No edema, No lymphangitis, No petechiae, No rashes, no synovitis   Data Reviewed: I have personally reviewed following labs and imaging studies Basic Metabolic Panel: Recent Labs  Lab 03/19/18 1215 03/21/18 0441 03/23/18 0836  NA 135 136 134*  K 3.8 4.0 3.8  CL 104 106 103  CO2 24 23 23   GLUCOSE 94 102* 123*  BUN 15 16 16   CREATININE 1.09 1.15 1.08  CALCIUM 8.9 8.5* 8.8*  MG  --  2.1 2.1   Liver Function Tests: Recent Labs  Lab 03/19/18 1215 03/21/18 0441  AST 34 22  ALT 26 20  ALKPHOS 57 52  BILITOT 0.9 0.8  PROT 6.2* 5.6*  ALBUMIN 3.9 3.5   No results for input(s): LIPASE, AMYLASE in the last 168 hours. No results for  input(s): AMMONIA in the last 168 hours. Coagulation Profile: No results for input(s): INR, PROTIME in the last 168 hours. CBC: Recent Labs  Lab 03/19/18 1215 03/21/18 0441 03/22/18 0633 03/23/18 0836  WBC 1.8* 2.9* 3.1* 3.2*  NEUTROABS 0.3* 0.7* 0.7*  --   HGB 9.5* 8.1* 8.9* 9.2*  HCT 29.2* 24.9* 27.5* 28.0*  MCV 103.2* 105.1* 103.8* 103.7*  PLT 65* 80* 92* 110*   Cardiac Enzymes: Recent Labs  Lab 03/19/18 1215 03/21/18 0441  CKTOTAL 232 47*  TROPONINI <0.03  --    BNP: Invalid input(s): POCBNP CBG: Recent Labs  Lab 03/21/18 0758  GLUCAP 113*   HbA1C: No results for input(s): HGBA1C in the last 72 hours. Urine analysis:    Component Value Date/Time   COLORURINE YELLOW 03/22/2018 1930   APPEARANCEUR CLEAR 03/22/2018 1930   LABSPEC 1.014 03/22/2018 1930   PHURINE 7.0 03/22/2018 1930   GLUCOSEU NEGATIVE 03/22/2018 1930   HGBUR NEGATIVE 03/22/2018 1930   [place] NEGATIVE 03/22/2018 [place] NEGATIVE 03/22/2018 1930   PROTEINUR NEGATIVE 03/22/2018 1930   NITRITE NEGATIVE 03/22/2018 1930   LEUKOCYTESUR NEGATIVE 03/22/2018 1930   Sepsis Labs: @LABRCNTIP (procalcitonin:4,lacticidven:4) ) Recent Results (from the past 240 hour(s))  Urine culture     Status: Abnormal   Collection Time: 03/19/18 12:46 PM  Result Value Ref Range Status   Specimen Description   Final    URINE, CLEAN CATCH Performed at [facility], [address] [place], [place] [address]    Special Requests   Final  NONE Performed at [facility], [address] [place], [place] [address]    Culture 50,000 COLONIES/mL STAPHYLOCOCCUS EPIDERMIDIS (A)  Final   Report Status 03/21/2018 FINAL  Final   Organism ID, Bacteria STAPHYLOCOCCUS EPIDERMIDIS (A)  Final      Susceptibility   Staphylococcus epidermidis - MIC*    CIPROFLOXACIN >=8 RESISTANT Resistant     GENTAMICIN 8 INTERMEDIATE Intermediate     NITROFURANTOIN <=16 SENSITIVE Sensitive     OXACILLIN >=4 RESISTANT Resistant      TETRACYCLINE >=16 RESISTANT Resistant     VANCOMYCIN 2 SENSITIVE Sensitive     TRIMETH/SULFA 160 RESISTANT Resistant     CLINDAMYCIN >=8 RESISTANT Resistant     RIFAMPIN 1 SENSITIVE Sensitive     Inducible Clindamycin NEGATIVE Sensitive     * 50,000 COLONIES/mL STAPHYLOCOCCUS EPIDERMIDIS  Culture, Urine     Status: Abnormal   Collection Time: 03/22/18  7:30 PM  Result Value Ref Range Status   Specimen Description   Final    URINE, CLEAN CATCH Performed at [facility], [address] [place], [place] [address]    Special Requests   Final    NONE Performed at [facility], [address], [place], [place] [address]    Culture (A)  Final    <10,000 COLONIES/mL INSIGNIFICANT GROWTH Performed at [facility], [place] [address], [place], [place] [address]    Report Status 03/24/2018 FINAL  Final     Scheduled Meds: . allopurinol  300 mg Oral Daily  . buPROPion  150 mg Oral BID  . feeding supplement (ENSURE ENLIVE)  237 mL Oral BID BM  . levothyroxine  150 mcg Oral Daily  . pantoprazole  40 mg Oral Daily   Continuous Infusions: . 0.9 % NaCl with KCl 20 mEq / L    . sodium chloride 500 mL (03/24/18 1509)    Procedures/Studies: Dg Chest 1 View  Result Date: 03/19/2018 CLINICAL DATA:  Found lying on the floor, weakness EXAM: CHEST  1 VIEW COMPARISON:  Portable chest x-ray of 11/02/2017 FINDINGS: No active infiltrate or effusion is seen. Mediastinal and hilar contours are stable and mild cardiomegaly is stable. No acute bony abnormality seen, with the bones appearing somewhat osteopenic. IMPRESSION: Stable mild cardiomegaly.  No active lung disease. Electronically Signed   By: Ivar Drape M.D.   On: 03/19/2018 12:19   Dg Lumbar Spine Complete  Result Date: 03/19/2018 CLINICAL DATA:  Found on for following fall EXAM: LUMBAR SPINE - COMPLETE 4+ VIEW COMPARISON:  None. FINDINGS: Five lumbar type vertebral bodies are well visualized. Vertebral body height is well maintained. No  pars defects are noted. No anterolisthesis is seen. Disc space narrowing is noted at L3-4 L4-5 and L5-S1 with associated osteophytes. No soft tissue abnormality is noted. IMPRESSION: Degenerative change without acute abnormality. Electronically Signed   By: Inez Catalina M.D.   On: 03/19/2018 12:21   Ct Head Wo Contrast  Result Date: 03/21/2018 CLINICAL DATA:  77 y/o  M; unwitnessed fall. EXAM: CT HEAD WITHOUT CONTRAST TECHNIQUE: Contiguous axial images were obtained from the base of the skull through the vertex without intravenous contrast. COMPARISON:  03/19/2018 and 11 20 CT head. FINDINGS: Brain: No evidence of acute infarction, hemorrhage, extra-axial collection or mass lesion/mass effect. Few stable subtle linear densities radiating out from the ventricles, probably developmental venous anomalies or other prominent vascular structures. Stable chronic microvascular ischemic changes and volume loss of the brain. Lateral and third ventriculomegaly (Evans index 0.46)  with mild scalloping of the margins and relative crowding of sulci at the vertex. Vascular: Calcific atherosclerosis of carotid siphons. No hyperdense vessel identified. 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 No calvarial fracture. Sinuses/Orbits: Mild mucosal thickening of the left maxillary sinus. Additional visible paranasal sinuses and the mastoid air cells are normally aerated. Other: None. IMPRESSION: 1. No acute intracranial abnormality identified. 2. Lateral and third ventriculomegaly with features suggestive of normal pressure hydrocephalus. Clinical correlation recommended. 3. Stable chronic microvascular ischemic changes and volume loss of the brain. Electronically Signed   By: Kristine Garbe M.D.   On: 03/21/2018 15:08   Ct Head Wo Contrast  Result Date: 03/19/2018 CLINICAL DATA:  Family check to patient this morning, found lying on the floor for unknown amount of time EXAM: CT HEAD WITHOUT CONTRAST  CT CERVICAL SPINE WITHOUT CONTRAST TECHNIQUE: Multidetector CT imaging of the head and cervical spine was performed following the standard protocol without intravenous contrast. Multiplanar CT image reconstructions of the cervical spine were also generated. COMPARISON:  03/06/2018 FINDINGS: CT HEAD FINDINGS Brain: There is central and cortical atrophy. Periventricular white matter changes are consistent with small vessel disease. There is no intra or extra-axial fluid collection or mass lesion. The basilar cisterns and ventricles have a normal appearance. There is no CT evidence for acute infarction or hemorrhage. Vascular: There is minimal atherosclerotic calcification of the internal carotid arteries. No hyperdense vessels. Skull: Intact. A circumscribed partially sclerotic lesion in the frontal bone, to the RIGHT of midline measures 2.0 x 1.7 centimeters, focally expanding the frontal bone. Sinuses/Orbits: No acute finding. Other: None CT CERVICAL SPINE FINDINGS Alignment: There is moderate degenerative change in the midcervical spine. Convex RIGHT scoliosis versus positioning. Skull base and vertebrae: No acute fracture. No primary bone lesion or focal pathologic process. Soft tissues and spinal canal: No prevertebral fluid or swelling. No visible canal hematoma. Disc levels: Moderate disc height loss and osteophytes at C4-5, C5-6, C6-7. Upper chest: Negative. Other: There is atherosclerotic calcification of the carotid arteries. IMPRESSION: 1.  No evidence for acute intracranial abnormality. 2. Atrophy and small vessel disease. 3. No acute cervical spine fracture. Moderate mid cervical spondylosis. 4. 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 No further evaluation is felt to be necessary. Electronically Signed   By: Nolon Nations M.D.   On: 03/19/2018 12:27   Ct Head Wo Contrast  Result Date: 03/06/2018 CLINICAL DATA:  Altered mentation baseline. EXAM: CT HEAD  WITHOUT CONTRAST TECHNIQUE: Contiguous axial images were obtained from the base of the skull through the vertex without intravenous contrast. COMPARISON:  None. FINDINGS: Brain: Mild sulcal and moderate ventricular prominence consistent with superficial and central atrophy. Chronic mild-to-moderate small vessel ischemic disease is noted. No large vascular territory infarct, hemorrhage or midline shift. Intra-axial mass nor extra-axial fluid. Vascular: No hyperdense vessel sign. Atherosclerosis of carotid siphons. Skull: Intact Sinuses/Orbits: Nonacute Other: None IMPRESSION: Superficial and central atrophy with chronic small vessel ischemic disease. No acute intracranial abnormality. Electronically Signed   By: Ashley Royalty M.D.   On: 03/06/2018 22:18   Ct Cervical Spine Wo Contrast  Result Date: 03/19/2018 CLINICAL DATA:  Family check to patient this morning, found lying on the floor for unknown amount of time EXAM: CT HEAD WITHOUT CONTRAST CT CERVICAL SPINE WITHOUT CONTRAST TECHNIQUE: Multidetector CT imaging of the head and cervical spine was performed following the standard protocol without intravenous contrast. Multiplanar CT image reconstructions of the cervical spine were  also generated. COMPARISON:  03/06/2018 FINDINGS: CT HEAD FINDINGS Brain: There is central and cortical atrophy. Periventricular white matter changes are consistent with small vessel disease. There is no intra or extra-axial fluid collection or mass lesion. The basilar cisterns and ventricles have a normal appearance. There is no CT evidence for acute infarction or hemorrhage. Vascular: There is minimal atherosclerotic calcification of the internal carotid arteries. No hyperdense vessels. Skull: Intact. A circumscribed partially sclerotic lesion in the frontal bone, to the RIGHT of midline measures 2.0 x 1.7 centimeters, focally expanding the frontal bone. Sinuses/Orbits: No acute finding. Other: None CT CERVICAL SPINE FINDINGS Alignment:  There is moderate degenerative change in the midcervical spine. Convex RIGHT scoliosis versus positioning. Skull base and vertebrae: No acute fracture. No primary bone lesion or focal pathologic process. Soft tissues and spinal canal: No prevertebral fluid or swelling. No visible canal hematoma. Disc levels: Moderate disc height loss and osteophytes at C4-5, C5-6, C6-7. Upper chest: Negative. Other: There is atherosclerotic calcification of the carotid arteries. IMPRESSION: 1.  No evidence for acute intracranial abnormality. 2. Atrophy and small vessel disease. 3. No acute cervical spine fracture. Moderate mid cervical spondylosis. 4. 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 No further evaluation is felt to be necessary. Electronically Signed   By: Nolon Nations M.D.   On: 03/19/2018 12:27    Omar Eva, DO  [facility] Pager [phone]  If 7PM-7AM, please contact night-coverage [email or web address] Password [email or web address] 03/24/2018, 4:08 PM   LOS: 0 days

## 2018-03-24 NOTE — Discharge Summary (Addendum)
Physician Discharge Summary  Omar Lee DQQ:[id number] DOB: 1941/02/20 DOA: 03/19/2018  PCP: [name], MD  Admit date: 03/19/2018 Discharge date: 03/26/2018  Admitted From: Home Disposition:  SNF  Recommendations for Outpatient Follow-up:  1. Follow up with PCP in 1-2 weeks 2. Please obtain BMP/CBC in one week 3. Repeat TSH in one month   Discharge Condition: Stable CODE STATUS: FULL Diet recommendation: Heart Healthy / Carb Modified    Brief/Interim Summary: 77 y.o.malewith medical history significantfordementia,hypothyroidism, AML whowas brought to the ED after he was found lying on the floor for anunknown periodof time. Patient apparently fell out of bed last night,he is unsure what time he fell. Reports generalized weakness,andsubsequent frequent falls.Patient states that he falls up to 10 times per week in the past month. He denies any loss of consciousness. He is only able to describe generalized weakness and a feeling of lightheadedness. He denies any vomiting, diarrhea, abdominal pain, dysuria, hematuria. There is no fevers or chills. In the emergency department, the patient was noted to be pancytopenic with WBC 1.8, hemoglobin 9.5, platelets 65,000. Orthostatic vital signs were positive from pulse increasing from 84 to 105.During his hospitalization, his work-up was essentially unremarkable. He sustained another mechanical fall during the hospitalization on 1219. Repeat CT of the brain showed no acute changes but showed ventriculomegaly. The case was discussed with neurology about possible NPH. They felt that the patient was stable for outpatient follow-up. During the hospitalization, the patient experienced hospital delirium requiring intermittent medications to calm him down.  Work up for reversible causes were essentially neg.    Discharge Diagnoses:  Gait instability/multiple falls -In part secondary to orthostasis and  dehydration -PT recommends skilled nursing facility -02/27/2018 serum B12 194 -17/40/8144 folic acid 81.8 -56/31/4970 TSH 5.310 -Repeat YOV78.588 -Free T4 0.72 -Urinalysis negative for pyuria -Urine drug screen positive only for opiates -check mag--2.1and CK--47 -03/21/18--CT brain--no acute findings--ventriculomegaly -discussed with neurology, Dr. Merlene Laughter about possibility of NPH-->pt is stable for outpt follow up -the patient has experience a functional decline from his previous baseline state to an extent that he is no longer safe to go back home after discharge from the hospital -he would benefit from skilled nursing care to improve his functional status  Dehydration -orthostatics positive -pt received IVF during the hospitalization -serum creatinine 0.96 at the time of discharge  Cognitive impairment -Apparently, patient was recently in the emergency department 03/06/2018 -The patient exhibited some paranoid and agitated behavior -Patient followed up with PCP who consulted neurology in the outpatient setting -Patient will need neuropsychiatryevaluation from [facility] the outpatient setting -pt has had intermittent episodes of agitation-->prn haldol -repeat UA--no pyuria  Myelodysplastic syndrome/AML -According to Dr.Katragadda's last note, pt's ANC was improving and venetoclax was to be discontinued -pt finished recent decitabine on 02/22/18 -he is to follow up with hematology in 6 weeks from that visit -case discussed with Dr. 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  to assist with his care and decision making when he is unable to do so -pt desires to be DNR which is confirmed with Ms Ardis Hughs Advance care planning, including the explanation and discussion of advance  directives was carried out with the patient and family.  Code status including explanations of "Full Code" and "DNR" and alternatives were discussed in detail.  Discussion of end-of-life issues including but not limited palliative care, hospice care and the concept of hospice, other end-of-life care options, power of attorney for health care decisions, living wills, and physician orders for life-sustaining treatment were also discussed with the patient and family.  Total face to face time 16 minutes.     Discharge Instructions   Allergies as of 03/26/2018   No Known Allergies     Medication List    STOP taking these medications   fluconazole 200 MG tablet Commonly known as:  DIFLUCAN   levofloxacin 500 MG tablet Commonly known as:  LEVAQUIN   Misc. Devices Misc   traMADol 50 MG tablet Commonly known as:  ULTRAM     TAKE these medications   allopurinol 300 MG tablet Commonly known as:  ZYLOPRIM Take 1 tablet (300 mg total) by mouth daily.   buPROPion 150 MG 12 hr tablet Commonly known as:  WELLBUTRIN SR Start taking the medication one po Q am for the first three days, then start taking one po BID.   feeding supplement (ENSURE ENLIVE) Liqd Take 237 mLs by mouth 2 (two) times daily between meals.   levothyroxine 150 MCG tablet Commonly known as:  SYNTHROID, LEVOTHROID Take 1 tablet (150 mcg total) by mouth daily. What changed:    medication strength  how much to take   pantoprazole 40 MG tablet Commonly known as:  [place] information for follow-up providers    Phillips Odor, MD Follow up in 1 month(s).   Specialty:  Neurology Contact information: [address] [place] [address] [phone]            Contact information for  after-discharge care    [facility] .   Service:  Skilled Nursing Contact information: [address] [place] [phone]                 No Known Allergies  Consultations:  none   Procedures/Studies: Dg Chest 1 View  Result Date: 03/19/2018 CLINICAL DATA:  Found lying on the floor, weakness EXAM: CHEST  1 VIEW COMPARISON:  Portable chest x-ray of 11/02/2017 FINDINGS: No active infiltrate or effusion is seen. Mediastinal and hilar contours are stable and mild cardiomegaly is stable. No acute bony abnormality seen, with the bones appearing somewhat osteopenic. IMPRESSION: Stable mild cardiomegaly.  No active lung disease. Electronically Signed   By: Ivar Drape M.D.   On: 03/19/2018 12:19   Dg Lumbar Spine Complete  Result Date: 03/19/2018 CLINICAL DATA:  Found on for following fall EXAM: LUMBAR SPINE - COMPLETE 4+ VIEW COMPARISON:  None. FINDINGS: Five lumbar type vertebral bodies are well visualized. Vertebral body height is well maintained. No pars defects are noted. No anterolisthesis is seen. Disc space narrowing is noted at L3-4 L4-5 and L5-S1 with associated osteophytes. No soft tissue abnormality is noted. IMPRESSION: Degenerative change without acute abnormality. Electronically Signed   By: Inez Catalina M.D.   On: 03/19/2018 12:21   Ct Head Wo Contrast  Result Date: 03/21/2018 CLINICAL DATA:  77 y/o  M; unwitnessed fall. EXAM: CT HEAD WITHOUT CONTRAST TECHNIQUE: Contiguous axial images were obtained from the base of the skull through the vertex without intravenous contrast. COMPARISON:  03/19/2018 and 11 20 CT head. FINDINGS:  Brain: No evidence of acute infarction, hemorrhage, extra-axial collection or mass lesion/mass effect. Few stable subtle linear densities radiating out from the ventricles, probably developmental venous anomalies or other prominent vascular structures. Stable chronic microvascular ischemic  changes and volume loss of the brain. Lateral and third ventriculomegaly (Evans index 0.46) with mild scalloping of the margins and relative crowding of sulci at the vertex. Vascular: Calcific atherosclerosis of carotid siphons. No hyperdense vessel identified. 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 No calvarial fracture. Sinuses/Orbits: Mild mucosal thickening of the left maxillary sinus. Additional visible paranasal sinuses and the mastoid air cells are normally aerated. Other: None. IMPRESSION: 1. No acute intracranial abnormality identified. 2. Lateral and third ventriculomegaly with features suggestive of normal pressure hydrocephalus. Clinical correlation recommended. 3. Stable chronic microvascular ischemic changes and volume loss of the brain. Electronically Signed   By: Kristine Garbe M.D.   On: 03/21/2018 15:08   Ct Head Wo Contrast  Result Date: 03/19/2018 CLINICAL DATA:  Family check to patient this morning, found lying on the floor for unknown amount of time EXAM: CT HEAD WITHOUT CONTRAST CT CERVICAL SPINE WITHOUT CONTRAST TECHNIQUE: Multidetector CT imaging of the head and cervical spine was performed following the standard protocol without intravenous contrast. Multiplanar CT image reconstructions of the cervical spine were also generated. COMPARISON:  03/06/2018 FINDINGS: CT HEAD FINDINGS Brain: There is central and cortical atrophy. Periventricular white matter changes are consistent with small vessel disease. There is no intra or extra-axial fluid collection or mass lesion. The basilar cisterns and ventricles have a normal appearance. There is no CT evidence for acute infarction or hemorrhage. Vascular: There is minimal atherosclerotic calcification of the internal carotid arteries. No hyperdense vessels. Skull: Intact. A circumscribed partially sclerotic lesion in the frontal bone, to the RIGHT of midline measures 2.0 x 1.7 centimeters, focally expanding the  frontal bone. Sinuses/Orbits: No acute finding. Other: None CT CERVICAL SPINE FINDINGS Alignment: There is moderate degenerative change in the midcervical spine. Convex RIGHT scoliosis versus positioning. Skull base and vertebrae: No acute fracture. No primary bone lesion or focal pathologic process. Soft tissues and spinal canal: No prevertebral fluid or swelling. No visible canal hematoma. Disc levels: Moderate disc height loss and osteophytes at C4-5, C5-6, C6-7. Upper chest: Negative. Other: There is atherosclerotic calcification of the carotid arteries. IMPRESSION: 1.  No evidence for acute intracranial abnormality. 2. Atrophy and small vessel disease. 3. No acute cervical spine fracture. Moderate mid cervical spondylosis. 4. 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 No further evaluation is felt to be necessary. Electronically Signed   By: Nolon Nations M.D.   On: 03/19/2018 12:27   Ct Head Wo Contrast  Result Date: 03/06/2018 CLINICAL DATA:  Altered mentation baseline. EXAM: CT HEAD WITHOUT CONTRAST TECHNIQUE: Contiguous axial images were obtained from the base of the skull through the vertex without intravenous contrast. COMPARISON:  None. FINDINGS: Brain: Mild sulcal and moderate ventricular prominence consistent with superficial and central atrophy. Chronic mild-to-moderate small vessel ischemic disease is noted. No large vascular territory infarct, hemorrhage or midline shift. Intra-axial mass nor extra-axial fluid. Vascular: No hyperdense vessel sign. Atherosclerosis of carotid siphons. Skull: Intact Sinuses/Orbits: Nonacute Other: None IMPRESSION: Superficial and central atrophy with chronic small vessel ischemic disease. No acute intracranial abnormality. Electronically Signed   By: Ashley Royalty M.D.   On: 03/06/2018 22:18   Ct Cervical Spine Wo Contrast  Result Date: 03/19/2018 CLINICAL DATA:  Family check to patient  this morning, found lying on the  floor for unknown amount of time EXAM: CT HEAD WITHOUT CONTRAST CT CERVICAL SPINE WITHOUT CONTRAST TECHNIQUE: Multidetector CT imaging of the head and cervical spine was performed following the standard protocol without intravenous contrast. Multiplanar CT image reconstructions of the cervical spine were also generated. COMPARISON:  03/06/2018 FINDINGS: CT HEAD FINDINGS Brain: There is central and cortical atrophy. Periventricular white matter changes are consistent with small vessel disease. There is no intra or extra-axial fluid collection or mass lesion. The basilar cisterns and ventricles have a normal appearance. There is no CT evidence for acute infarction or hemorrhage. Vascular: There is minimal atherosclerotic calcification of the internal carotid arteries. No hyperdense vessels. Skull: Intact. A circumscribed partially sclerotic lesion in the frontal bone, to the RIGHT of midline measures 2.0 x 1.7 centimeters, focally expanding the frontal bone. Sinuses/Orbits: No acute finding. Other: None CT CERVICAL SPINE FINDINGS Alignment: There is moderate degenerative change in the midcervical spine. Convex RIGHT scoliosis versus positioning. Skull base and vertebrae: No acute fracture. No primary bone lesion or focal pathologic process. Soft tissues and spinal canal: No prevertebral fluid or swelling. No visible canal hematoma. Disc levels: Moderate disc height loss and osteophytes at C4-5, C5-6, C6-7. Upper chest: Negative. Other: There is atherosclerotic calcification of the carotid arteries. IMPRESSION: 1.  No evidence for acute intracranial abnormality. 2. Atrophy and small vessel disease. 3. No acute cervical spine fracture. Moderate mid cervical spondylosis. 4. 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 No further evaluation is felt to be necessary. Electronically Signed   By: Nolon Nations M.D.   On: 03/19/2018 12:27        Discharge Exam: Vitals:   03/25/18  2136 03/26/18 0648  BP: (!) 100/50 131/67  Pulse: 98 91  Resp:    Temp: 99.9 F (37.7 C) (!) 97.5 F (36.4 C)  SpO2:  99%   Vitals:   03/25/18 0500 03/25/18 1530 03/25/18 2136 03/26/18 0648  BP: 124/69 (!) 143/68 (!) 100/50 131/67  Pulse: 96 99 98 91  Resp: 16 19    Temp: 98 F (36.7 C) 99.4 F (37.4 C) 99.9 F (37.7 C) (!) 97.5 F (36.4 C)  TempSrc:  Oral Oral Oral  SpO2: 96% 99%  99%  Weight:      Height:        General: Pt is alert, awake, not in acute distress Cardiovascular: RRR, S1/S2 +, no rubs, no gallops Respiratory: CTA bilaterally, no wheezing, no rhonchi Abdominal: Soft, NT, ND, bowel sounds + Extremities: no edema, no cyanosis   The results of significant diagnostics from this hospitalization (including imaging, microbiology, ancillary and laboratory) are listed below for reference.    Significant Diagnostic Studies: Dg Chest 1 View  Result Date: 03/19/2018 CLINICAL DATA:  Found lying on the floor, weakness EXAM: CHEST  1 VIEW COMPARISON:  Portable chest x-ray of 11/02/2017 FINDINGS: No active infiltrate or effusion is seen. Mediastinal and hilar contours are stable and mild cardiomegaly is stable. No acute bony abnormality seen, with the bones appearing somewhat osteopenic. IMPRESSION: Stable mild cardiomegaly.  No active lung disease. Electronically Signed   By: Ivar Drape M.D.   On: 03/19/2018 12:19   Dg Lumbar Spine Complete  Result Date: 03/19/2018 CLINICAL DATA:  Found on for following fall EXAM: LUMBAR SPINE - COMPLETE 4+ VIEW COMPARISON:  None. FINDINGS: Five lumbar type vertebral bodies are well visualized. Vertebral body height is well maintained. No pars defects are  noted. No anterolisthesis is seen. Disc space narrowing is noted at L3-4 L4-5 and L5-S1 with associated osteophytes. No soft tissue abnormality is noted. IMPRESSION: Degenerative change without acute abnormality. Electronically Signed   By: Inez Catalina M.D.   On: 03/19/2018 12:21   Ct  Head Wo Contrast  Result Date: 03/21/2018 CLINICAL DATA:  77 y/o  M; unwitnessed fall. EXAM: CT HEAD WITHOUT CONTRAST TECHNIQUE: Contiguous axial images were obtained from the base of the skull through the vertex without intravenous contrast. COMPARISON:  03/19/2018 and 11 20 CT head. FINDINGS: Brain: No evidence of acute infarction, hemorrhage, extra-axial collection or mass lesion/mass effect. Few stable subtle linear densities radiating out from the ventricles, probably developmental venous anomalies or other prominent vascular structures. Stable chronic microvascular ischemic changes and volume loss of the brain. Lateral and third ventriculomegaly (Evans index 0.46) with mild scalloping of the margins and relative crowding of sulci at the vertex. Vascular: Calcific atherosclerosis of carotid siphons. No hyperdense vessel identified. 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 No calvarial fracture. Sinuses/Orbits: Mild mucosal thickening of the left maxillary sinus. Additional visible paranasal sinuses and the mastoid air cells are normally aerated. Other: None. IMPRESSION: 1. No acute intracranial abnormality identified. 2. Lateral and third ventriculomegaly with features suggestive of normal pressure hydrocephalus. Clinical correlation recommended. 3. Stable chronic microvascular ischemic changes and volume loss of the brain. Electronically Signed   By: Kristine Garbe M.D.   On: 03/21/2018 15:08   Ct Head Wo Contrast  Result Date: 03/19/2018 CLINICAL DATA:  Family check to patient this morning, found lying on the floor for unknown amount of time EXAM: CT HEAD WITHOUT CONTRAST CT CERVICAL SPINE WITHOUT CONTRAST TECHNIQUE: Multidetector CT imaging of the head and cervical spine was performed following the standard protocol without intravenous contrast. Multiplanar CT image reconstructions of the cervical spine were also generated. COMPARISON:  03/06/2018 FINDINGS: CT HEAD  FINDINGS Brain: There is central and cortical atrophy. Periventricular white matter changes are consistent with small vessel disease. There is no intra or extra-axial fluid collection or mass lesion. The basilar cisterns and ventricles have a normal appearance. There is no CT evidence for acute infarction or hemorrhage. Vascular: There is minimal atherosclerotic calcification of the internal carotid arteries. No hyperdense vessels. Skull: Intact. A circumscribed partially sclerotic lesion in the frontal bone, to the RIGHT of midline measures 2.0 x 1.7 centimeters, focally expanding the frontal bone. Sinuses/Orbits: No acute finding. Other: None CT CERVICAL SPINE FINDINGS Alignment: There is moderate degenerative change in the midcervical spine. Convex RIGHT scoliosis versus positioning. Skull base and vertebrae: No acute fracture. No primary bone lesion or focal pathologic process. Soft tissues and spinal canal: No prevertebral fluid or swelling. No visible canal hematoma. Disc levels: Moderate disc height loss and osteophytes at C4-5, C5-6, C6-7. Upper chest: Negative. Other: There is atherosclerotic calcification of the carotid arteries. IMPRESSION: 1.  No evidence for acute intracranial abnormality. 2. Atrophy and small vessel disease. 3. No acute cervical spine fracture. Moderate mid cervical spondylosis. 4. 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 No further evaluation is felt to be necessary. Electronically Signed   By: Nolon Nations M.D.   On: 03/19/2018 12:27   Ct Head Wo Contrast  Result Date: 03/06/2018 CLINICAL DATA:  Altered mentation baseline. EXAM: CT HEAD WITHOUT CONTRAST TECHNIQUE: Contiguous axial images were obtained from the base of the skull through the vertex without intravenous contrast. COMPARISON:  None. FINDINGS: Brain: Mild  sulcal and moderate ventricular prominence consistent with superficial and central atrophy. Chronic mild-to-moderate small  vessel ischemic disease is noted. No large vascular territory infarct, hemorrhage or midline shift. Intra-axial mass nor extra-axial fluid. Vascular: No hyperdense vessel sign. Atherosclerosis of carotid siphons. Skull: Intact Sinuses/Orbits: Nonacute Other: None IMPRESSION: Superficial and central atrophy with chronic small vessel ischemic disease. No acute intracranial abnormality. Electronically Signed   By: Ashley Royalty M.D.   On: 03/06/2018 22:18   Ct Cervical Spine Wo Contrast  Result Date: 03/19/2018 CLINICAL DATA:  Family check to patient this morning, found lying on the floor for unknown amount of time EXAM: CT HEAD WITHOUT CONTRAST CT CERVICAL SPINE WITHOUT CONTRAST TECHNIQUE: Multidetector CT imaging of the head and cervical spine was performed following the standard protocol without intravenous contrast. Multiplanar CT image reconstructions of the cervical spine were also generated. COMPARISON:  03/06/2018 FINDINGS: CT HEAD FINDINGS Brain: There is central and cortical atrophy. Periventricular white matter changes are consistent with small vessel disease. There is no intra or extra-axial fluid collection or mass lesion. The basilar cisterns and ventricles have a normal appearance. There is no CT evidence for acute infarction or hemorrhage. Vascular: There is minimal atherosclerotic calcification of the internal carotid arteries. No hyperdense vessels. Skull: Intact. A circumscribed partially sclerotic lesion in the frontal bone, to the RIGHT of midline measures 2.0 x 1.7 centimeters, focally expanding the frontal bone. Sinuses/Orbits: No acute finding. Other: None CT CERVICAL SPINE FINDINGS Alignment: There is moderate degenerative change in the midcervical spine. Convex RIGHT scoliosis versus positioning. Skull base and vertebrae: No acute fracture. No primary bone lesion or focal pathologic process. Soft tissues and spinal canal: No prevertebral fluid or swelling. No visible canal hematoma. Disc  levels: Moderate disc height loss and osteophytes at C4-5, C5-6, C6-7. Upper chest: Negative. Other: There is atherosclerotic calcification of the carotid arteries. IMPRESSION: 1.  No evidence for acute intracranial abnormality. 2. Atrophy and small vessel disease. 3. No acute cervical spine fracture. Moderate mid cervical spondylosis. 4. 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 No further evaluation is felt to be necessary. Electronically Signed   By: Nolon Nations M.D.   On: 03/19/2018 12:27     Microbiology: Recent Results (from the past 240 hour(s))  Urine culture     Status: Abnormal   Collection Time: 03/19/18 12:46 PM  Result Value Ref Range Status   Specimen Description   Final    URINE, CLEAN CATCH Performed at [facility], [address], [place], [place] [address]    Special Requests   Final    NONE Performed at [facility], [address] [place], [place] [address]    Culture 50,000 COLONIES/mL STAPHYLOCOCCUS EPIDERMIDIS (A)  Final   Report Status 03/21/2018 FINAL  Final   Organism ID, Bacteria STAPHYLOCOCCUS EPIDERMIDIS (A)  Final      Susceptibility   Staphylococcus epidermidis - MIC*    CIPROFLOXACIN >=8 RESISTANT Resistant     GENTAMICIN 8 INTERMEDIATE Intermediate     NITROFURANTOIN <=16 SENSITIVE Sensitive     OXACILLIN >=4 RESISTANT Resistant     TETRACYCLINE >=16 RESISTANT Resistant     VANCOMYCIN 2 SENSITIVE Sensitive     TRIMETH/SULFA 160 RESISTANT Resistant     CLINDAMYCIN >=8 RESISTANT Resistant     RIFAMPIN 1 SENSITIVE Sensitive     Inducible Clindamycin NEGATIVE Sensitive     * 50,000 COLONIES/mL STAPHYLOCOCCUS EPIDERMIDIS  Culture, Urine     Status: Abnormal  Collection Time: 03/22/18  7:30 PM  Result Value Ref Range Status   Specimen Description   Final    URINE, CLEAN CATCH Performed at [facility], [address], [place], [place] [address]    Special Requests   Final    NONE Performed at [facility], [address], [place], [place] [address]    Culture (A)  Final    <10,000 COLONIES/mL INSIGNIFICANT GROWTH Performed at [facility] [address], [place], [place] [address]    Report Status 03/24/2018 FINAL  Final     Labs: Basic Metabolic Panel: Recent Labs  Lab 03/19/18 1215 03/21/18 0441 03/23/18 0836 03/25/18 0634  NA 135 136 134* 136  K 3.8 4.0 3.8 3.9  CL 104 106 103 105  CO2 24 23 23 23   GLUCOSE 94 102* 123* 115*  BUN 15 16 16 19   CREATININE 1.09 1.15 1.08 0.96  CALCIUM 8.9 8.5* 8.8* 8.7*  MG  --  2.1 2.1  --    Liver Function Tests: Recent Labs  Lab 03/19/18 1215 03/21/18 0441  AST 34 22  ALT 26 20  ALKPHOS 57 52  BILITOT 0.9 0.8  PROT 6.2* 5.6*  ALBUMIN 3.9 3.5   No results for input(s): LIPASE, AMYLASE in the last 168 hours. Recent Labs  Lab 03/25/18 0634  AMMONIA 21   CBC: Recent Labs  Lab 03/19/18 1215 03/21/18 0441 03/22/18 0633 03/23/18 0836 03/25/18 0634  WBC 1.8* 2.9* 3.1* 3.2* 3.6*  NEUTROABS 0.3* 0.7* 0.7*  --   --   HGB 9.5* 8.1* 8.9* 9.2* 9.0*  HCT 29.2* 24.9* 27.5* 28.0* 27.9*  MCV 103.2* 105.1* 103.8* 103.7* 103.7*  PLT 65* 80* 92* 110* 93*   Cardiac Enzymes: Recent Labs  Lab 03/19/18 1215 03/21/18 0441  CKTOTAL 232 47*  TROPONINI <0.03  --    BNP: Invalid input(s): POCBNP CBG: Recent Labs  Lab 03/21/18 0758  GLUCAP 113*    Time coordinating discharge:  36 minutes  Signed:  Orson Eva, DO [facility] Pager: [phone] 03/26/2018, 10:41 AM

## 2018-03-25 ENCOUNTER — Other Ambulatory Visit (HOSPITAL_COMMUNITY): Payer: Medicare HMO

## 2018-03-25 ENCOUNTER — Encounter (HOSPITAL_COMMUNITY): Payer: Medicare HMO

## 2018-03-25 DIAGNOSIS — R69 Illness, unspecified: Secondary | ICD-10-CM | POA: Diagnosis not present

## 2018-03-25 DIAGNOSIS — I951 Orthostatic hypotension: Secondary | ICD-10-CM | POA: Diagnosis not present

## 2018-03-25 DIAGNOSIS — Z7189 Other specified counseling: Secondary | ICD-10-CM | POA: Diagnosis not present

## 2018-03-25 DIAGNOSIS — C9200 Acute myeloblastic leukemia, not having achieved remission: Secondary | ICD-10-CM | POA: Diagnosis not present

## 2018-03-25 DIAGNOSIS — R531 Weakness: Secondary | ICD-10-CM | POA: Diagnosis not present

## 2018-03-25 DIAGNOSIS — E860 Dehydration: Secondary | ICD-10-CM | POA: Diagnosis not present

## 2018-03-25 DIAGNOSIS — D61818 Other pancytopenia: Secondary | ICD-10-CM | POA: Diagnosis not present

## 2018-03-25 DIAGNOSIS — F0391 Unspecified dementia with behavioral disturbance: Secondary | ICD-10-CM | POA: Diagnosis not present

## 2018-03-25 LAB — BASIC METABOLIC PANEL
ANION GAP: 8 (ref 5–15)
BUN: 19 mg/dL (ref 8–23)
CO2: 23 mmol/L (ref 22–32)
Calcium: 8.7 mg/dL — ABNORMAL LOW (ref 8.9–10.3)
Chloride: 105 mmol/L (ref 98–111)
Creatinine, Ser: 0.96 mg/dL (ref 0.61–1.24)
GFR calc Af Amer: >60 mL/min (ref >60)
GFR calc non Af Amer: >60 mL/min (ref >60)
Glucose, Bld: 115 mg/dL — ABNORMAL HIGH (ref 70–99)
Potassium: 3.9 mmol/L (ref 3.5–5.1)
Sodium: 136 mmol/L (ref 135–145)

## 2018-03-25 LAB — CBC
HCT: 27.9 % — ABNORMAL LOW (ref 39.0–52.0)
Hemoglobin: 9 g/dL — ABNORMAL LOW (ref 13.0–17.0)
MCH: 33.5 pg (ref 26.0–34.0)
MCHC: 32.3 g/dL (ref 30.0–36.0)
MCV: 103.7 fL — ABNORMAL HIGH (ref 80.0–100.0)
NRBC: 1.9 % — AB (ref 0.0–0.2)
Platelets: 93 10*3/uL — ABNORMAL LOW (ref 150–400)
RBC: 2.69 MIL/uL — ABNORMAL LOW (ref 4.22–5.81)
RDW: 17.1 % — ABNORMAL HIGH (ref 11.5–15.5)
WBC: 3.6 10*3/uL — ABNORMAL LOW (ref 4.0–10.5)

## 2018-03-25 LAB — AMMONIA: Ammonia: 21 umol/L (ref 9–35)

## 2018-03-25 MED ORDER — SODIUM CHLORIDE 0.9% FLUSH
3.0000 mL | Freq: Two times a day (BID) | INTRAVENOUS | Status: DC
  Administered 2018-03-25 – 2018-03-26 (×2): 3 mL via INTRAVENOUS

## 2018-03-25 MED ORDER — SODIUM CHLORIDE 0.9% FLUSH: 3.0000 mL | INTRAVENOUS | Status: DC | PRN

## 2018-03-25 MED ORDER — SODIUM CHLORIDE 0.9 % IV SOLN: 250.0000 mL | INTRAVENOUS | Status: DC | PRN

## 2018-03-25 NOTE — Clinical Social Work Note (Signed)
Debbie at [facility] indicated that [employer] request updated clinicals. LCSW sent most updated therapy notes.   Winner Valeriano, Clydene Pugh, LCSW

## 2018-03-25 NOTE — Clinical Social Work Note (Signed)
Updated PT note uploaded to facility to send to [facility].   Lex Linhares, Clydene Pugh, LCSW

## 2018-03-25 NOTE — Progress Notes (Signed)
PROGRESS NOTE  Omar Lee UKG:[id number] DOB: 08-18-1940 DOA: 03/19/2018 PCP: [name], MD  Brief History:  77 y.o.malewith medical history significantfordementia,hypothyroidism, AML whowas brought to the ED after he was found lying on the floor for anunknown periodof time. Patient apparently fell out of bed last night,he is unsure what time he fell. Reports generalized weakness,andsubsequent frequent falls.Patient states that he falls up to 10 times per week in the past month. He denies any loss of consciousness. He is only able to describe generalized weakness and a feeling of lightheadedness. He denies any vomiting, diarrhea, abdominal pain, dysuria, hematuria. There is no fevers or chills. In the emergency department, the patient was noted to be pancytopenic with WBC 1.8, hemoglobin 9.5, platelets 65,000. Orthostatic vital signs were positive from pulse increasing from 84 to 105.During his hospitalization, his work-up was essentially unremarkable. He sustained another mechanical fall during the hospitalization on 1219. Repeat CT of the brain showed no acute changes but showed ventriculomegaly. The case was discussed with neurology about possible NPH. They felt that the patient was stable for outpatient follow-up. During the hospitalization, the patient experienced hospital delirium requiring intermittent medications to calm him down. Work up for reversible causes were essentially neg.   Assessment/Plan: Gait instability/multiple falls -In part secondary to orthostasis and dehydration -PT recommends skilled nursing facility -02/27/2018 serum B12 762 -83/15/1761 folic acid 60.7 -37/01/6268 TSH 5.310 -Repeat SWN46.270 -Free T4 0.72 -Urinalysis negative for pyuria -Urine drug screen positive only for opiates -check mag--2.1and CK--47 -03/21/18--CT brain--no acute findings--ventriculomegaly -discussed with neurology, Dr. Merlene Laughter about  possibility of NPH-->pt is stable for outpt follow up -the patient has experience a functional decline from his previous baseline state to an extent that he is no longer safe to go back home after discharge from the hospital -he would benefit from skilled nursing care to improve his functional status  Dehydration -orthostatics positive -pt received IVF during the hospitalization -serum creatinine 0.96 at the time of discharge  Cognitive impairment -Apparently, patient was recently in the emergency department 03/06/2018 -The patient exhibited some paranoid and agitated behavior -Patient followed up with PCP who consulted neurology in the outpatient setting -Patient will need neuropsychiatryevaluation from [facility] the outpatient setting -pt has had intermittent episodes of agitation-->prn haldol -repeat UA--no pyuria  Myelodysplastic syndrome/AML -According to Dr.Katragadda's last note, pt's ANC was improving and venetoclax was to be discontinued -pt finished recent decitabine on 02/22/18 -he is to follow up with hematology in 6 weeks from that visit -case discussed with Dr. 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 to assist with his care and decision making when he is unable to do so -pt desires to be DNR which is confirmed with Ms Ardis Hughs Advance care planning, including the explanation and discussion of advance directives was carried out with the patient and family. Code status including explanations of "Full Code" and "DNR" and alternatives were discussed in detail. Discussion of end-of-life issues  including but not limited palliative care, hospice care and the concept of hospice, other end-of-life care options, power of attorney for health care decisions, living wills, and physician orders for life-sustaining treatment  were also discussed with the patient and family. Total face to face time 16 minutes.   Disposition Plan:SNF 12/10if bed available Family Communication:updated [name] 12/8--Total time spent 35 minutes.  Greater than 50% spent face to face counseling and coordinating care.   Consultants:hematology  Code Status: FULL DVT Prophylaxis: SCDs   Procedures: As Listed in Progress Note Above  Antibiotics: None           Subjective: Pt is intermittently agitated.  Denies cp, sob, headache, n/v/d, abd pain.  Objective: Vitals:   03/24/18 1400 03/24/18 2041 03/25/18 0500 03/25/18 1530  BP: 139/70 138/90 124/69 (!) 143/68  Pulse: 98 (!) 101 96 99  Resp: 18 19 16 19   Temp: 98.6 F (37 C) 97.9 F (36.6 C) 98 F (36.7 C) 99.4 F (37.4 C)  TempSrc: Oral Axillary  Oral  SpO2: 96% 94% 96% 99%  Weight:      Height:        Intake/Output Summary (Last 24 hours) at 03/25/2018 1627 Last data filed at 03/25/2018 1532 Gross per 24 hour  Intake 1613.74 ml  Output 600 ml  Net 1013.74 ml   Weight change:  Exam:   General:  Pt is alert, follows commands appropriately, not in acute distress  HEENT: No icterus, No thrush, No neck mass, [place]/AT  Cardiovascular: RRR, S1/S2, no rubs, no gallops  Respiratory: CTA bilaterally, no wheezing, no crackles, no rhonchi  Abdomen: Soft/+BS, non tender, non distended, no guarding  Extremities: No edema, No lymphangitis, No petechiae, No rashes, no synovitis   Data Reviewed: I have personally reviewed following labs and imaging studies Basic Metabolic Panel: Recent Labs  Lab 03/19/18 1215 03/21/18 0441 03/23/18 0836 03/25/18 0634  NA 135 136 134* 136  K 3.8 4.0 3.8 3.9  CL 104 106 103 105    CO2 24 23 23 23   GLUCOSE 94 102* 123* 115*  BUN 15 16 16 19   CREATININE 1.09 1.15 1.08 0.96  CALCIUM 8.9 8.5* 8.8* 8.7*  MG  --  2.1 2.1  --    Liver Function Tests: Recent Labs  Lab 03/19/18 1215 03/21/18 0441  AST 34 22  ALT 26 20  ALKPHOS 57 52  BILITOT 0.9 0.8  PROT 6.2* 5.6*  ALBUMIN 3.9 3.5   No results for input(s): LIPASE, AMYLASE in the last 168 hours. Recent Labs  Lab 03/25/18 0634  AMMONIA 21   Coagulation Profile: No results for input(s): INR, PROTIME in the last 168 hours. CBC: Recent Labs  Lab 03/19/18 1215 03/21/18 0441 03/22/18 0633 03/23/18 0836 03/25/18 0634  WBC 1.8* 2.9* 3.1* 3.2* 3.6*  NEUTROABS 0.3* 0.7* 0.7*  --   --   HGB 9.5* 8.1* 8.9* 9.2* 9.0*  HCT 29.2* 24.9* 27.5* 28.0* 27.9*  MCV 103.2* 105.1* 103.8* 103.7* 103.7*  PLT 65* 80* 92* 110* 93*   Cardiac Enzymes: Recent Labs  Lab 03/19/18 1215 03/21/18 0441  CKTOTAL 232 47*  TROPONINI <0.03  --    BNP: Invalid input(s): POCBNP CBG: Recent Labs  Lab 03/21/18 0758  GLUCAP 113*   HbA1C: No results for input(s): HGBA1C in the last 72 hours. Urine analysis:    Component Value Date/Time   COLORURINE YELLOW 03/22/2018 1930   APPEARANCEUR CLEAR 03/22/2018 1930   LABSPEC 1.014 03/22/2018 1930   PHURINE 7.0 03/22/2018 1930   GLUCOSEU NEGATIVE 03/22/2018 1930   HGBUR NEGATIVE 03/22/2018 1930   BILIRUBINUR NEGATIVE 03/22/2018 [place] NEGATIVE 03/22/2018 1930   PROTEINUR NEGATIVE 03/22/2018  [place] 03/22/2018 1930   LEUKOCYTESUR NEGATIVE 03/22/2018 1930   Sepsis Labs: @LABRCNTIP (procalcitonin:4,lacticidven:4) ) Recent Results (from the past 240 hour(s))  Urine culture     Status: Abnormal   Collection Time: 03/19/18 12:46 PM  Result Value Ref Range Status   Specimen Description   Final    URINE, CLEAN CATCH Performed at [facility], [address] [place], [place] [address]    Special Requests   Final    NONE Performed at [facility], [address], [place], [place] [address]    Culture 50,000 COLONIES/mL STAPHYLOCOCCUS EPIDERMIDIS (A)  Final   Report Status 03/21/2018 FINAL  Final   Organism ID, Bacteria STAPHYLOCOCCUS EPIDERMIDIS (A)  Final      Susceptibility   Staphylococcus epidermidis - MIC*    CIPROFLOXACIN >=8 RESISTANT Resistant     GENTAMICIN 8 INTERMEDIATE Intermediate     NITROFURANTOIN <=16 SENSITIVE Sensitive     OXACILLIN >=4 RESISTANT Resistant     TETRACYCLINE >=16 RESISTANT Resistant     VANCOMYCIN 2 SENSITIVE Sensitive     TRIMETH/SULFA 160 RESISTANT Resistant     CLINDAMYCIN >=8 RESISTANT Resistant     RIFAMPIN 1 SENSITIVE Sensitive     Inducible Clindamycin NEGATIVE Sensitive     * 50,000 COLONIES/mL STAPHYLOCOCCUS EPIDERMIDIS  Culture, Urine     Status: Abnormal   Collection Time: 03/22/18  7:30 PM  Result Value Ref Range Status   Specimen Description   Final    URINE, CLEAN CATCH Performed at [facility], [address] [place], [place] [address]    Special Requests   Final    NONE Performed at [facility], [address] [place], [place] [address]    Culture (A)  Final    <10,000 COLONIES/mL INSIGNIFICANT GROWTH Performed at [facility], [place] [address], [place], [place] [address]    Report Status 03/24/2018 FINAL  Final     Scheduled Meds: . allopurinol  300 mg Oral Daily  . buPROPion  150 mg Oral BID  . feeding supplement (ENSURE ENLIVE)  237 mL Oral BID BM  . levothyroxine  150 mcg Oral Daily  . pantoprazole  40 mg Oral Daily   Continuous Infusions:  Procedures/Studies: Dg Chest 1 View  Result Date: 03/19/2018 CLINICAL DATA:  Found lying on the floor, weakness EXAM: CHEST  1 VIEW COMPARISON:  Portable chest x-ray of 11/02/2017 FINDINGS: No active infiltrate or effusion is seen. Mediastinal and hilar contours are stable and mild cardiomegaly is stable. No acute bony abnormality seen, with the bones appearing somewhat osteopenic. IMPRESSION: Stable mild  cardiomegaly.  No active lung disease. Electronically Signed   By: Ivar Drape M.D.   On: 03/19/2018 12:19   Dg Lumbar Spine Complete  Result Date: 03/19/2018 CLINICAL DATA:  Found on for following fall EXAM: LUMBAR SPINE - COMPLETE 4+ VIEW COMPARISON:  None. FINDINGS: Five lumbar type vertebral bodies are well visualized. Vertebral body height is well maintained. No pars defects are noted. No anterolisthesis is seen. Disc space narrowing is noted at L3-4 L4-5 and L5-S1 with associated osteophytes. No soft tissue abnormality is noted. IMPRESSION: Degenerative change without acute abnormality. Electronically Signed   By: Inez Catalina M.D.   On: 03/19/2018 12:21   Ct Head Wo Contrast  Result Date: 03/21/2018 CLINICAL DATA:  77 y/o  M; unwitnessed fall. EXAM: CT HEAD WITHOUT CONTRAST TECHNIQUE: Contiguous axial images were obtained from the base of the skull through the vertex without  intravenous contrast. COMPARISON:  03/19/2018 and 11 20 CT head. FINDINGS: Brain: No evidence of acute infarction, hemorrhage, extra-axial collection or mass lesion/mass effect. Few stable subtle linear densities radiating out from the ventricles, probably developmental venous anomalies or other prominent vascular structures. Stable chronic microvascular ischemic changes and volume loss of the brain. Lateral and third ventriculomegaly (Evans index 0.46) with mild scalloping of the margins and relative crowding of sulci at the vertex. Vascular: Calcific atherosclerosis of carotid siphons. No hyperdense vessel identified. 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 No calvarial fracture. Sinuses/Orbits: Mild mucosal thickening of the left maxillary sinus. Additional visible paranasal sinuses and the mastoid air cells are normally aerated. Other: None. IMPRESSION: 1. No acute intracranial abnormality identified. 2. Lateral and third ventriculomegaly with features suggestive of normal pressure hydrocephalus.  Clinical correlation recommended. 3. Stable chronic microvascular ischemic changes and volume loss of the brain. Electronically Signed   By: Kristine Garbe M.D.   On: 03/21/2018 15:08   Ct Head Wo Contrast  Result Date: 03/19/2018 CLINICAL DATA:  Family check to patient this morning, found lying on the floor for unknown amount of time EXAM: CT HEAD WITHOUT CONTRAST CT CERVICAL SPINE WITHOUT CONTRAST TECHNIQUE: Multidetector CT imaging of the head and cervical spine was performed following the standard protocol without intravenous contrast. Multiplanar CT image reconstructions of the cervical spine were also generated. COMPARISON:  03/06/2018 FINDINGS: CT HEAD FINDINGS Brain: There is central and cortical atrophy. Periventricular white matter changes are consistent with small vessel disease. There is no intra or extra-axial fluid collection or mass lesion. The basilar cisterns and ventricles have a normal appearance. There is no CT evidence for acute infarction or hemorrhage. Vascular: There is minimal atherosclerotic calcification of the internal carotid arteries. No hyperdense vessels. Skull: Intact. A circumscribed partially sclerotic lesion in the frontal bone, to the RIGHT of midline measures 2.0 x 1.7 centimeters, focally expanding the frontal bone. Sinuses/Orbits: No acute finding. Other: None CT CERVICAL SPINE FINDINGS Alignment: There is moderate degenerative change in the midcervical spine. Convex RIGHT scoliosis versus positioning. Skull base and vertebrae: No acute fracture. No primary bone lesion or focal pathologic process. Soft tissues and spinal canal: No prevertebral fluid or swelling. No visible canal hematoma. Disc levels: Moderate disc height loss and osteophytes at C4-5, C5-6, C6-7. Upper chest: Negative. Other: There is atherosclerotic calcification of the carotid arteries. IMPRESSION: 1.  No evidence for acute intracranial abnormality. 2. Atrophy and small vessel disease. 3. No  acute cervical spine fracture. Moderate mid cervical spondylosis. 4. 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 No further evaluation is felt to be necessary. Electronically Signed   By: Nolon Nations M.D.   On: 03/19/2018 12:27   Ct Head Wo Contrast  Result Date: 03/06/2018 CLINICAL DATA:  Altered mentation baseline. EXAM: CT HEAD WITHOUT CONTRAST TECHNIQUE: Contiguous axial images were obtained from the base of the skull through the vertex without intravenous contrast. COMPARISON:  None. FINDINGS: Brain: Mild sulcal and moderate ventricular prominence consistent with superficial and central atrophy. Chronic mild-to-moderate small vessel ischemic disease is noted. No large vascular territory infarct, hemorrhage or midline shift. Intra-axial mass nor extra-axial fluid. Vascular: No hyperdense vessel sign. Atherosclerosis of carotid siphons. Skull: Intact Sinuses/Orbits: Nonacute Other: None IMPRESSION: Superficial and central atrophy with chronic small vessel ischemic disease. No acute intracranial abnormality. Electronically Signed   By: Ashley Royalty M.D.   On: 03/06/2018 22:18   Ct Cervical Spine Wo Contrast  Result Date: 03/19/2018 CLINICAL DATA:  Family check to patient this morning, found lying on the floor for unknown amount of time EXAM: CT HEAD WITHOUT CONTRAST CT CERVICAL SPINE WITHOUT CONTRAST TECHNIQUE: Multidetector CT imaging of the head and cervical spine was performed following the standard protocol without intravenous contrast. Multiplanar CT image reconstructions of the cervical spine were also generated. COMPARISON:  03/06/2018 FINDINGS: CT HEAD FINDINGS Brain: There is central and cortical atrophy. Periventricular white matter changes are consistent with small vessel disease. There is no intra or extra-axial fluid collection or mass lesion. The basilar cisterns and ventricles have a normal appearance. There is no CT evidence for acute infarction or  hemorrhage. Vascular: There is minimal atherosclerotic calcification of the internal carotid arteries. No hyperdense vessels. Skull: Intact. A circumscribed partially sclerotic lesion in the frontal bone, to the RIGHT of midline measures 2.0 x 1.7 centimeters, focally expanding the frontal bone. Sinuses/Orbits: No acute finding. Other: None CT CERVICAL SPINE FINDINGS Alignment: There is moderate degenerative change in the midcervical spine. Convex RIGHT scoliosis versus positioning. Skull base and vertebrae: No acute fracture. No primary bone lesion or focal pathologic process. Soft tissues and spinal canal: No prevertebral fluid or swelling. No visible canal hematoma. Disc levels: Moderate disc height loss and osteophytes at C4-5, C5-6, C6-7. Upper chest: Negative. Other: There is atherosclerotic calcification of the carotid arteries. IMPRESSION: 1.  No evidence for acute intracranial abnormality. 2. Atrophy and small vessel disease. 3. No acute cervical spine fracture. Moderate mid cervical spondylosis. 4. 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 No further evaluation is felt to be necessary. Electronically Signed   By: Nolon Nations M.D.   On: 03/19/2018 12:27    Orson Eva, DO  [facility] Pager [phone]  If 7PM-7AM, please contact night-coverage [email or web address] Password [email or web address] 03/25/2018, 4:27 PM   LOS: 0 days

## 2018-03-25 NOTE — Progress Notes (Signed)
Physical Therapy Treatment Patient Details Name: Omar Lee MRN: [id number] DOB: [birth year] Today's Date: 03/25/2018    History of Present Illness Omar Lee is a 77 y.o. 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  Patient apparently fell out of bed last night, he is unsure what time he fell.  Reports generalized weakness, and subsequent frequent falls. He tells me he thinks he falls because he is on chemo.  No shortness of breath no cough, no chest pain, no burning with urination, no fevers or chills, no vomiting or loose stools, no abdominal pain.  He has tried to maintained good p.o. intake.    PT Comments    Patient tolerated sitting up at bedside for approximately 15 minutes while completing BLE ROM/strengthening exercises requiring Max verbal/tactile cueing and demonstration.  Patient unable to fully extend knees when attempting 3 trials of sit to stands due to b weakness, patient can fully extend knees when lying in bed but unable to support his body weight when standing.  Patient put back to bed after therapy due to fatigue/drowsiness.  Patient will benefit from continued physical therapy in hospital and recommended venue below to increase strength, balance, endurance for safe ADLs and gait.   Follow Up Recommendations  SNF     Equipment Recommendations  None recommended by PT    Recommendations for Other Services       Precautions / Restrictions Precautions Precautions: Fall Restrictions Weight Bearing Restrictions: No    Mobility  Bed Mobility Overal bed mobility: Needs Assistance Bed Mobility: Supine to Sit;Sit to Supine     Supine to sit: Mod assist Sit to supine: Mod assist   General bed mobility comments: slow labored movement  Transfers Overall transfer level: Needs assistance Equipment used: Rolling walker (2  wheeled) Transfers: Sit to/from Stand Sit to Stand: Max assist         General transfer comment: unable to fully extend legs when attempting sit to stands due to BLE weakness  Ambulation/Gait                 Stairs             Wheelchair Mobility    Modified Rankin (Stroke Patients Only)       Balance Overall balance assessment: Needs assistance Sitting-balance support: Feet supported;No upper extremity supported Sitting balance-Leahy Scale: Poor Sitting balance - Comments: fair/poor support self with BLE with frequent leaning to the left Postural control: Left lateral lean                                  Cognition Arousal/Alertness: Awake/alert Behavior During Therapy: WFL for tasks assessed/performed Overall Cognitive Status: Within Functional Limits for tasks assessed                                        Exercises General Exercises - Lower Extremity Long Arc Quad: Seated;AROM;Strengthening;Both;10 reps Hip Flexion/Marching: Seated;AROM;Strengthening;Both;10 reps Toe Raises: Seated;AROM;Strengthening;10 reps;Both Heel Raises: Seated;AROM;Strengthening;Both;10 reps    General Comments        Pertinent Vitals/Pain Pain Assessment: Faces Faces Pain Scale: Hurts a little bit Pain Location: bilateral shoulders Pain Descriptors / Indicators: Sore Pain Intervention(s): Limited activity within patient's  tolerance;Monitored during session    Home Living                      Prior Function            PT Goals (current goals can now be found in the care plan section) Acute Rehab PT Goals Patient Stated Goal: return home after rehab PT Goal Formulation: With patient Time For Goal Achievement: 04/03/18 Potential to Achieve Goals: Good Progress towards PT goals: Progressing toward goals    Frequency    Min 3X/week      PT Plan Current plan remains appropriate    Co-evaluation               AM-PAC PT "6 Clicks" Mobility   Outcome Measure  Help needed turning from your back to your side while in a flat bed without using bedrails?: Total Help needed moving from lying on your back to sitting on the side of a flat bed without using bedrails?: Total Help needed moving to and from a bed to a chair (including a wheelchair)?: Total Help needed standing up from a chair using your arms (e.g., wheelchair or bedside chair)?: A Lot Help needed to walk in hospital room?: Total Help needed climbing 3-5 steps with a railing? : Total 6 Click Score: 7    End of Session   Activity Tolerance: Patient tolerated treatment well;Patient limited by fatigue Patient left: in bed;with call bell/phone within reach;with bed alarm set Nurse Communication: Mobility status PT Visit Diagnosis: Unsteadiness on feet (R26.81);Other abnormalities of gait and mobility (R26.89);Muscle weakness (generalized) (M62.81)     Time: 1791-5056 PT Time Calculation (min) (ACUTE ONLY): 27 min  Charges:  $Therapeutic Exercise: 8-22 mins $Therapeutic Activity: 8-22 mins                     2:47 PM, 03/25/18 Lonell Grandchild, MPT Physical Therapist with [facility] 336 [phone] office [phone] mobile phone

## 2018-03-26 DIAGNOSIS — A419 Sepsis, unspecified organism: Secondary | ICD-10-CM | POA: Diagnosis not present

## 2018-03-26 DIAGNOSIS — D469 Myelodysplastic syndrome, unspecified: Secondary | ICD-10-CM | POA: Diagnosis not present

## 2018-03-26 DIAGNOSIS — Z7189 Other specified counseling: Secondary | ICD-10-CM | POA: Diagnosis not present

## 2018-03-26 DIAGNOSIS — F0391 Unspecified dementia with behavioral disturbance: Secondary | ICD-10-CM | POA: Diagnosis not present

## 2018-03-26 DIAGNOSIS — D61810 Antineoplastic chemotherapy induced pancytopenia: Secondary | ICD-10-CM | POA: Diagnosis not present

## 2018-03-26 DIAGNOSIS — E870 Hyperosmolality and hypernatremia: Secondary | ICD-10-CM | POA: Diagnosis not present

## 2018-03-26 DIAGNOSIS — R404 Transient alteration of awareness: Secondary | ICD-10-CM | POA: Diagnosis not present

## 2018-03-26 DIAGNOSIS — R413 Other amnesia: Secondary | ICD-10-CM | POA: Diagnosis present

## 2018-03-26 DIAGNOSIS — E785 Hyperlipidemia, unspecified: Secondary | ICD-10-CM | POA: Diagnosis not present

## 2018-03-26 DIAGNOSIS — Z833 Family history of diabetes mellitus: Secondary | ICD-10-CM | POA: Diagnosis not present

## 2018-03-26 DIAGNOSIS — R4020 Unspecified coma: Secondary | ICD-10-CM | POA: Diagnosis not present

## 2018-03-26 DIAGNOSIS — R4182 Altered mental status, unspecified: Secondary | ICD-10-CM | POA: Diagnosis not present

## 2018-03-26 DIAGNOSIS — Z79891 Long term (current) use of opiate analgesic: Secondary | ICD-10-CM | POA: Diagnosis not present

## 2018-03-26 DIAGNOSIS — D464 Refractory anemia, unspecified: Secondary | ICD-10-CM | POA: Diagnosis not present

## 2018-03-26 DIAGNOSIS — E860 Dehydration: Secondary | ICD-10-CM | POA: Diagnosis not present

## 2018-03-26 DIAGNOSIS — F0390 Unspecified dementia without behavioral disturbance: Secondary | ICD-10-CM | POA: Diagnosis present

## 2018-03-26 DIAGNOSIS — C9200 Acute myeloblastic leukemia, not having achieved remission: Secondary | ICD-10-CM | POA: Diagnosis not present

## 2018-03-26 DIAGNOSIS — R296 Repeated falls: Secondary | ICD-10-CM | POA: Diagnosis not present

## 2018-03-26 DIAGNOSIS — Z743 Need for continuous supervision: Secondary | ICD-10-CM | POA: Diagnosis not present

## 2018-03-26 DIAGNOSIS — D72829 Elevated white blood cell count, unspecified: Secondary | ICD-10-CM | POA: Diagnosis not present

## 2018-03-26 DIAGNOSIS — K219 Gastro-esophageal reflux disease without esophagitis: Secondary | ICD-10-CM | POA: Diagnosis not present

## 2018-03-26 DIAGNOSIS — Z79890 Hormone replacement therapy (postmenopausal): Secondary | ICD-10-CM | POA: Diagnosis not present

## 2018-03-26 DIAGNOSIS — G92 Toxic encephalopathy: Secondary | ICD-10-CM | POA: Diagnosis not present

## 2018-03-26 DIAGNOSIS — R69 Illness, unspecified: Secondary | ICD-10-CM | POA: Diagnosis not present

## 2018-03-26 DIAGNOSIS — Z515 Encounter for palliative care: Secondary | ICD-10-CM | POA: Diagnosis present

## 2018-03-26 DIAGNOSIS — D61818 Other pancytopenia: Secondary | ICD-10-CM | POA: Diagnosis not present

## 2018-03-26 DIAGNOSIS — R4189 Other symptoms and signs involving cognitive functions and awareness: Secondary | ICD-10-CM | POA: Diagnosis present

## 2018-03-26 DIAGNOSIS — R531 Weakness: Secondary | ICD-10-CM | POA: Diagnosis not present

## 2018-03-26 DIAGNOSIS — R638 Other symptoms and signs concerning food and fluid intake: Secondary | ICD-10-CM | POA: Diagnosis not present

## 2018-03-26 DIAGNOSIS — G038 Meningitis due to other specified causes: Secondary | ICD-10-CM | POA: Diagnosis not present

## 2018-03-26 DIAGNOSIS — R41841 Cognitive communication deficit: Secondary | ICD-10-CM | POA: Diagnosis not present

## 2018-03-26 DIAGNOSIS — R0902 Hypoxemia: Secondary | ICD-10-CM | POA: Diagnosis not present

## 2018-03-26 DIAGNOSIS — Z8249 Family history of ischemic heart disease and other diseases of the circulatory system: Secondary | ICD-10-CM | POA: Diagnosis not present

## 2018-03-26 DIAGNOSIS — Z79899 Other long term (current) drug therapy: Secondary | ICD-10-CM | POA: Diagnosis not present

## 2018-03-26 DIAGNOSIS — R52 Pain, unspecified: Secondary | ICD-10-CM | POA: Diagnosis not present

## 2018-03-26 DIAGNOSIS — G9389 Other specified disorders of brain: Secondary | ICD-10-CM | POA: Diagnosis present

## 2018-03-26 DIAGNOSIS — R6520 Severe sepsis without septic shock: Secondary | ICD-10-CM | POA: Diagnosis not present

## 2018-03-26 DIAGNOSIS — I959 Hypotension, unspecified: Secondary | ICD-10-CM | POA: Diagnosis not present

## 2018-03-26 DIAGNOSIS — D696 Thrombocytopenia, unspecified: Secondary | ICD-10-CM | POA: Diagnosis not present

## 2018-03-26 DIAGNOSIS — D638 Anemia in other chronic diseases classified elsewhere: Secondary | ICD-10-CM | POA: Diagnosis present

## 2018-03-26 DIAGNOSIS — G9340 Encephalopathy, unspecified: Secondary | ICD-10-CM | POA: Diagnosis not present

## 2018-03-26 DIAGNOSIS — R000 Tachycardia, unspecified: Secondary | ICD-10-CM | POA: Diagnosis not present

## 2018-03-26 DIAGNOSIS — M109 Gout, unspecified: Secondary | ICD-10-CM | POA: Diagnosis not present

## 2018-03-26 DIAGNOSIS — G4730 Sleep apnea, unspecified: Secondary | ICD-10-CM | POA: Diagnosis present

## 2018-03-26 DIAGNOSIS — B9620 Unspecified Escherichia coli [E. coli] as the cause of diseases classified elsewhere: Secondary | ICD-10-CM | POA: Diagnosis present

## 2018-03-26 DIAGNOSIS — R1312 Dysphagia, oropharyngeal phase: Secondary | ICD-10-CM | POA: Diagnosis not present

## 2018-03-26 DIAGNOSIS — E038 Other specified hypothyroidism: Secondary | ICD-10-CM | POA: Diagnosis not present

## 2018-03-26 DIAGNOSIS — M6281 Muscle weakness (generalized): Secondary | ICD-10-CM | POA: Diagnosis not present

## 2018-03-26 DIAGNOSIS — I951 Orthostatic hypotension: Secondary | ICD-10-CM | POA: Diagnosis not present

## 2018-03-26 DIAGNOSIS — G919 Hydrocephalus, unspecified: Secondary | ICD-10-CM | POA: Diagnosis not present

## 2018-03-26 DIAGNOSIS — N3000 Acute cystitis without hematuria: Secondary | ICD-10-CM | POA: Diagnosis not present

## 2018-03-26 DIAGNOSIS — E039 Hypothyroidism, unspecified: Secondary | ICD-10-CM | POA: Diagnosis not present

## 2018-03-26 DIAGNOSIS — Y846 Urinary catheterization as the cause of abnormal reaction of the patient, or of later complication, without mention of misadventure at the time of the procedure: Secondary | ICD-10-CM | POA: Diagnosis not present

## 2018-03-26 DIAGNOSIS — T83511A Infection and inflammatory reaction due to indwelling urethral catheter, initial encounter: Secondary | ICD-10-CM | POA: Diagnosis not present

## 2018-03-26 DIAGNOSIS — N179 Acute kidney failure, unspecified: Secondary | ICD-10-CM | POA: Diagnosis not present

## 2018-03-26 LAB — RPR: RPR Ser Ql: NONREACTIVE

## 2018-03-26 NOTE — Clinical Social Work Placement (Signed)
   CLINICAL SOCIAL WORK PLACEMENT  NOTE  Date:  03/26/2018  Patient Details  Name: Omar Lee MRN: [id number] Date of Birth: [birth year]  Clinical Social Work is seeking post-discharge placement for this patient at the [place] level of care (*CSW will initial, date and re-position this form in  chart as items are completed):  Yes   Patient/family provided with [place] Work Department's list of facilities offering this level of care within the geographic area requested by the patient (or if unable, by the patient's family).  Yes   Patient/family informed of their freedom to choose among providers that offer the needed level of care, that participate in Medicare, Medicaid or managed care program needed by the patient, have an available bed and are willing to accept the patient.  Yes   Patient/family informed of [facility]'s ownership interest in [facility] and [facility], as well as of the fact that they are under no obligation to receive care at these facilities.  PASRR submitted to EDS on 03/20/18     PASRR number received on 03/20/18     Existing PASRR number confirmed on       FL2 transmitted to all facilities in geographic area requested by pt/family on 03/21/18     FL2 transmitted to all facilities within larger geographic area on       Patient informed that his/her managed care company has contracts with or will negotiate with certain facilities, including the following:        Yes   Patient/family informed of bed offers received.  Patient chooses bed at [place] [facility]     Physician recommends and patient chooses bed at      Patient to be transferred to [facility] at [facility] on 03/26/18.  Patient to be transferred to facility by [facility]     Patient family notified on 03/26/18 of transfer.  Name of family member notified:  Laurine Blazer     PHYSICIAN       Additional Comment:  CSW called both Laurine Blazer,  friend, at [phone] and [place], APS worker [phone] to inform them of transfer today. Filled out transportation medical necessity form after consulting with nurse Brittney.  Sent up dated information to [place].     _______________________________________________ Trish Mage, LCSW 03/26/2018, 11:31 AM

## 2018-03-26 NOTE — Progress Notes (Signed)
Report called and given to nursing staff at [facility] in [place], [place]. Awaiting EMS for transport.

## 2018-03-27 ENCOUNTER — Encounter: Payer: Self-pay | Admitting: Family Medicine

## 2018-03-27 ENCOUNTER — Encounter (HOSPITAL_COMMUNITY): Payer: Self-pay | Admitting: Physical Therapy

## 2018-03-27 NOTE — Therapy (Signed)
[place], [place], [address] Phone: [phone]   Fax:  [phone]  Patient Details  Name: Omar Lee MRN: [id number] Date of Birth: 1941-03-07 Referring Provider:  No ref. provider found  Encounter Date: 03/27/2018  PHYSICAL THERAPY DISCHARGE SUMMARY  Visits from Start of Care: 1  Current functional level related to goals / functional outcomes: Unable to re-assess as patient did not return following evaluation.    Remaining deficits: Unable to re-assess as patient did not return following evaluation.       Education / Equipment: Evaluation findings, and plan of care. Further Education not able to be provided as patient did not return following evaluation.  Plan: Patient agrees to discharge.  Patient goals were not met. Patient is being discharged due to the patient's request.  ?????     Patient's caregiver called to report that patient's physician wants patient to be discharged at this time.          Clarene Critchley PT, DPT 1:58 PM, 03/27/18 [place], [place], [address] Phone: [phone]   Fax:  [phone]

## 2018-03-28 ENCOUNTER — Ambulatory Visit (HOSPITAL_COMMUNITY): Payer: Medicare HMO | Admitting: Physical Therapy

## 2018-03-28 DIAGNOSIS — D61818 Other pancytopenia: Secondary | ICD-10-CM | POA: Diagnosis not present

## 2018-03-28 DIAGNOSIS — E039 Hypothyroidism, unspecified: Secondary | ICD-10-CM | POA: Diagnosis not present

## 2018-03-28 DIAGNOSIS — C9200 Acute myeloblastic leukemia, not having achieved remission: Secondary | ICD-10-CM | POA: Diagnosis not present

## 2018-03-28 DIAGNOSIS — R296 Repeated falls: Secondary | ICD-10-CM | POA: Diagnosis not present

## 2018-04-01 ENCOUNTER — Ambulatory Visit (HOSPITAL_COMMUNITY): Payer: Medicare HMO

## 2018-04-01 ENCOUNTER — Other Ambulatory Visit (HOSPITAL_COMMUNITY): Payer: Medicare HMO

## 2018-04-01 ENCOUNTER — Ambulatory Visit (HOSPITAL_COMMUNITY): Payer: Medicare HMO | Admitting: Hematology

## 2018-04-02 ENCOUNTER — Encounter (HOSPITAL_COMMUNITY): Payer: Medicare HMO

## 2018-04-02 ENCOUNTER — Ambulatory Visit (HOSPITAL_COMMUNITY): Payer: Medicare HMO

## 2018-04-03 ENCOUNTER — Ambulatory Visit (HOSPITAL_COMMUNITY): Payer: Medicare HMO

## 2018-04-03 DIAGNOSIS — C9200 Acute myeloblastic leukemia, not having achieved remission: Secondary | ICD-10-CM | POA: Diagnosis not present

## 2018-04-03 DIAGNOSIS — E785 Hyperlipidemia, unspecified: Secondary | ICD-10-CM | POA: Diagnosis not present

## 2018-04-03 DIAGNOSIS — R296 Repeated falls: Secondary | ICD-10-CM | POA: Diagnosis not present

## 2018-04-03 DIAGNOSIS — E039 Hypothyroidism, unspecified: Secondary | ICD-10-CM | POA: Diagnosis not present

## 2018-04-04 ENCOUNTER — Other Ambulatory Visit (HOSPITAL_COMMUNITY): Payer: Medicare HMO

## 2018-04-04 ENCOUNTER — Ambulatory Visit (HOSPITAL_COMMUNITY): Payer: Medicare HMO

## 2018-04-04 ENCOUNTER — Encounter (HOSPITAL_COMMUNITY): Payer: Medicare HMO | Admitting: Physical Therapy

## 2018-04-04 DIAGNOSIS — D72829 Elevated white blood cell count, unspecified: Secondary | ICD-10-CM | POA: Diagnosis not present

## 2018-04-04 DIAGNOSIS — R69 Illness, unspecified: Secondary | ICD-10-CM | POA: Diagnosis not present

## 2018-04-04 DIAGNOSIS — M109 Gout, unspecified: Secondary | ICD-10-CM | POA: Diagnosis not present

## 2018-04-04 DIAGNOSIS — R296 Repeated falls: Secondary | ICD-10-CM | POA: Diagnosis not present

## 2018-04-05 ENCOUNTER — Ambulatory Visit (HOSPITAL_COMMUNITY): Payer: Medicare HMO

## 2018-04-05 ENCOUNTER — Encounter (HOSPITAL_COMMUNITY): Payer: Medicare HMO

## 2018-04-08 ENCOUNTER — Emergency Department (HOSPITAL_COMMUNITY): Payer: Medicare HMO

## 2018-04-08 ENCOUNTER — Ambulatory Visit: Payer: Medicare HMO | Admitting: Family Medicine

## 2018-04-08 ENCOUNTER — Inpatient Hospital Stay (HOSPITAL_COMMUNITY)
Admission: EM | Admit: 2018-04-08 | Discharge: 2018-04-12 | DRG: 698 | Disposition: A | Discharge status: Hospice | Payer: Medicare HMO | Source: Skilled Nursing Facility | Attending: Internal Medicine | Admitting: Internal Medicine

## 2018-04-08 ENCOUNTER — Other Ambulatory Visit: Payer: Self-pay

## 2018-04-08 ENCOUNTER — Encounter (HOSPITAL_COMMUNITY): Payer: Self-pay | Admitting: Emergency Medicine

## 2018-04-08 DIAGNOSIS — N3000 Acute cystitis without hematuria: Secondary | ICD-10-CM | POA: Diagnosis present

## 2018-04-08 DIAGNOSIS — T83511A Infection and inflammatory reaction due to indwelling urethral catheter, initial encounter: Principal | ICD-10-CM | POA: Diagnosis present

## 2018-04-08 DIAGNOSIS — Y846 Urinary catheterization as the cause of abnormal reaction of the patient, or of later complication, without mention of misadventure at the time of the procedure: Secondary | ICD-10-CM | POA: Diagnosis not present

## 2018-04-08 DIAGNOSIS — D464 Refractory anemia, unspecified: Secondary | ICD-10-CM | POA: Diagnosis present

## 2018-04-08 DIAGNOSIS — D72829 Elevated white blood cell count, unspecified: Secondary | ICD-10-CM | POA: Diagnosis not present

## 2018-04-08 DIAGNOSIS — K219 Gastro-esophageal reflux disease without esophagitis: Secondary | ICD-10-CM | POA: Diagnosis present

## 2018-04-08 DIAGNOSIS — R6520 Severe sepsis without septic shock: Secondary | ICD-10-CM | POA: Diagnosis not present

## 2018-04-08 DIAGNOSIS — D469 Myelodysplastic syndrome, unspecified: Secondary | ICD-10-CM | POA: Diagnosis not present

## 2018-04-08 DIAGNOSIS — Z515 Encounter for palliative care: Secondary | ICD-10-CM | POA: Diagnosis present

## 2018-04-08 DIAGNOSIS — Z79890 Hormone replacement therapy (postmenopausal): Secondary | ICD-10-CM

## 2018-04-08 DIAGNOSIS — R296 Repeated falls: Secondary | ICD-10-CM

## 2018-04-08 DIAGNOSIS — G9340 Encephalopathy, unspecified: Secondary | ICD-10-CM | POA: Diagnosis not present

## 2018-04-08 DIAGNOSIS — Z79899 Other long term (current) drug therapy: Secondary | ICD-10-CM

## 2018-04-08 DIAGNOSIS — E870 Hyperosmolality and hypernatremia: Secondary | ICD-10-CM | POA: Diagnosis present

## 2018-04-08 DIAGNOSIS — E785 Hyperlipidemia, unspecified: Secondary | ICD-10-CM | POA: Diagnosis present

## 2018-04-08 DIAGNOSIS — C9200 Acute myeloblastic leukemia, not having achieved remission: Secondary | ICD-10-CM | POA: Diagnosis not present

## 2018-04-08 DIAGNOSIS — R531 Weakness: Secondary | ICD-10-CM | POA: Diagnosis not present

## 2018-04-08 DIAGNOSIS — R638 Other symptoms and signs concerning food and fluid intake: Secondary | ICD-10-CM | POA: Diagnosis not present

## 2018-04-08 DIAGNOSIS — R000 Tachycardia, unspecified: Secondary | ICD-10-CM | POA: Diagnosis not present

## 2018-04-08 DIAGNOSIS — D61818 Other pancytopenia: Secondary | ICD-10-CM | POA: Diagnosis not present

## 2018-04-08 DIAGNOSIS — R52 Pain, unspecified: Secondary | ICD-10-CM | POA: Diagnosis not present

## 2018-04-08 DIAGNOSIS — R4189 Other symptoms and signs involving cognitive functions and awareness: Secondary | ICD-10-CM | POA: Diagnosis present

## 2018-04-08 DIAGNOSIS — G4730 Sleep apnea, unspecified: Secondary | ICD-10-CM | POA: Diagnosis present

## 2018-04-08 DIAGNOSIS — D638 Anemia in other chronic diseases classified elsewhere: Secondary | ICD-10-CM | POA: Diagnosis present

## 2018-04-08 DIAGNOSIS — F0391 Unspecified dementia with behavioral disturbance: Secondary | ICD-10-CM

## 2018-04-08 DIAGNOSIS — Z856 Personal history of leukemia: Secondary | ICD-10-CM

## 2018-04-08 DIAGNOSIS — G919 Hydrocephalus, unspecified: Secondary | ICD-10-CM | POA: Diagnosis not present

## 2018-04-08 DIAGNOSIS — M6281 Muscle weakness (generalized): Secondary | ICD-10-CM | POA: Diagnosis not present

## 2018-04-08 DIAGNOSIS — A419 Sepsis, unspecified organism: Secondary | ICD-10-CM | POA: Diagnosis not present

## 2018-04-08 DIAGNOSIS — Z79891 Long term (current) use of opiate analgesic: Secondary | ICD-10-CM | POA: Diagnosis not present

## 2018-04-08 DIAGNOSIS — G9389 Other specified disorders of brain: Secondary | ICD-10-CM | POA: Diagnosis present

## 2018-04-08 DIAGNOSIS — R41841 Cognitive communication deficit: Secondary | ICD-10-CM | POA: Diagnosis not present

## 2018-04-08 DIAGNOSIS — G92 Toxic encephalopathy: Secondary | ICD-10-CM | POA: Diagnosis present

## 2018-04-08 DIAGNOSIS — Z66 Do not resuscitate: Secondary | ICD-10-CM | POA: Diagnosis present

## 2018-04-08 DIAGNOSIS — E860 Dehydration: Secondary | ICD-10-CM | POA: Diagnosis not present

## 2018-04-08 DIAGNOSIS — Z8249 Family history of ischemic heart disease and other diseases of the circulatory system: Secondary | ICD-10-CM

## 2018-04-08 DIAGNOSIS — R69 Illness, unspecified: Secondary | ICD-10-CM | POA: Diagnosis not present

## 2018-04-08 DIAGNOSIS — R413 Other amnesia: Secondary | ICD-10-CM | POA: Diagnosis present

## 2018-04-08 DIAGNOSIS — G038 Meningitis due to other specified causes: Secondary | ICD-10-CM | POA: Diagnosis not present

## 2018-04-08 DIAGNOSIS — B9620 Unspecified Escherichia coli [E. coli] as the cause of diseases classified elsewhere: Secondary | ICD-10-CM | POA: Diagnosis present

## 2018-04-08 DIAGNOSIS — Z833 Family history of diabetes mellitus: Secondary | ICD-10-CM

## 2018-04-08 DIAGNOSIS — E039 Hypothyroidism, unspecified: Secondary | ICD-10-CM | POA: Diagnosis not present

## 2018-04-08 DIAGNOSIS — R4182 Altered mental status, unspecified: Secondary | ICD-10-CM | POA: Diagnosis not present

## 2018-04-08 DIAGNOSIS — F0390 Unspecified dementia without behavioral disturbance: Secondary | ICD-10-CM | POA: Diagnosis present

## 2018-04-08 DIAGNOSIS — R404 Transient alteration of awareness: Secondary | ICD-10-CM | POA: Diagnosis not present

## 2018-04-08 DIAGNOSIS — D696 Thrombocytopenia, unspecified: Secondary | ICD-10-CM | POA: Diagnosis not present

## 2018-04-08 DIAGNOSIS — N179 Acute kidney failure, unspecified: Secondary | ICD-10-CM | POA: Diagnosis not present

## 2018-04-08 DIAGNOSIS — R4020 Unspecified coma: Secondary | ICD-10-CM | POA: Diagnosis not present

## 2018-04-08 DIAGNOSIS — D61810 Antineoplastic chemotherapy induced pancytopenia: Secondary | ICD-10-CM | POA: Diagnosis not present

## 2018-04-08 DIAGNOSIS — E038 Other specified hypothyroidism: Secondary | ICD-10-CM | POA: Diagnosis not present

## 2018-04-08 DIAGNOSIS — R1312 Dysphagia, oropharyngeal phase: Secondary | ICD-10-CM | POA: Diagnosis not present

## 2018-04-08 DIAGNOSIS — R0902 Hypoxemia: Secondary | ICD-10-CM | POA: Diagnosis not present

## 2018-04-08 LAB — COMPREHENSIVE METABOLIC PANEL
ALT: 33 U/L (ref 0–44)
AST: 32 U/L (ref 15–41)
Albumin: 3.6 g/dL (ref 3.5–5.0)
Alkaline Phosphatase: 60 U/L (ref 38–126)
Anion gap: 7 (ref 5–15)
BUN: 51 mg/dL — ABNORMAL HIGH (ref 8–23)
CHLORIDE: 114 mmol/L — AB (ref 98–111)
CO2: 23 mmol/L (ref 22–32)
Calcium: 9 mg/dL (ref 8.9–10.3)
Creatinine, Ser: 1.35 mg/dL — ABNORMAL HIGH (ref 0.61–1.24)
GFR calc Af Amer: 58 mL/min — ABNORMAL LOW (ref >60)
GFR calc non Af Amer: 50 mL/min — ABNORMAL LOW (ref >60)
Glucose, Bld: 134 mg/dL — ABNORMAL HIGH (ref 70–99)
Potassium: 4.4 mmol/L (ref 3.5–5.1)
Sodium: 144 mmol/L (ref 135–145)
Total Bilirubin: 0.6 mg/dL (ref 0.3–1.2)
Total Protein: 7 g/dL (ref 6.5–8.1)

## 2018-04-08 LAB — CBC WITH DIFFERENTIAL/PLATELET
Abs Immature Granulocytes: 1.64 10*3/uL — ABNORMAL HIGH (ref 0.00–0.07)
Basophils Absolute: 0.2 10*3/uL — ABNORMAL HIGH (ref 0.0–0.1)
Basophils Relative: 1 %
Eosinophils Absolute: 0 10*3/uL (ref 0.0–0.5)
Eosinophils Relative: 0 %
HCT: 28.6 % — ABNORMAL LOW (ref 39.0–52.0)
HEMOGLOBIN: 8.8 g/dL — AB (ref 13.0–17.0)
Immature Granulocytes: 7 %
Lymphocytes Relative: 17 %
Lymphs Abs: 4.1 10*3/uL — ABNORMAL HIGH (ref 0.7–4.0)
MCH: 33.1 pg (ref 26.0–34.0)
MCHC: 30.8 g/dL (ref 30.0–36.0)
MCV: 107.5 fL — ABNORMAL HIGH (ref 80.0–100.0)
MONOS PCT: 36 %
Monocytes Absolute: 9 10*3/uL — ABNORMAL HIGH (ref 0.1–1.0)
Neutro Abs: 10.1 10*3/uL — ABNORMAL HIGH (ref 1.7–7.7)
Neutrophils Relative %: 39 %
Platelets: 59 10*3/uL — ABNORMAL LOW (ref 150–400)
RBC: 2.66 MIL/uL — ABNORMAL LOW (ref 4.22–5.81)
RDW: 18.6 % — ABNORMAL HIGH (ref 11.5–15.5)
WBC: 25.1 10*3/uL — ABNORMAL HIGH (ref 4.0–10.5)
nRBC: 1.3 % — ABNORMAL HIGH (ref 0.0–0.2)

## 2018-04-08 LAB — URINALYSIS, ROUTINE W REFLEX MICROSCOPIC
Bilirubin Urine: NEGATIVE
Glucose, UA: NEGATIVE mg/dL
Ketones, ur: NEGATIVE mg/dL
Nitrite: NEGATIVE
Protein, ur: 100 mg/dL — AB
Specific Gravity, Urine: 1.013 (ref 1.005–1.030)
pH: 7 (ref 5.0–8.0)

## 2018-04-08 LAB — I-STAT CHEM 8, ED
BUN: 42 mg/dL — ABNORMAL HIGH (ref 8–23)
Calcium, Ion: 1.15 mmol/L (ref 1.15–1.40)
Chloride: 112 mmol/L — ABNORMAL HIGH (ref 98–111)
Creatinine, Ser: 1.3 mg/dL — ABNORMAL HIGH (ref 0.61–1.24)
Glucose, Bld: 133 mg/dL — ABNORMAL HIGH (ref 70–99)
HCT: 26 % — ABNORMAL LOW (ref 39.0–52.0)
Hemoglobin: 8.8 g/dL — ABNORMAL LOW (ref 13.0–17.0)
Potassium: 4.4 mmol/L (ref 3.5–5.1)
SODIUM: 147 mmol/L — AB (ref 135–145)
TCO2: 25 mmol/L (ref 22–32)

## 2018-04-08 LAB — I-STAT TROPONIN, ED: TROPONIN I, POC: 0 ng/mL (ref 0.00–0.08)

## 2018-04-08 LAB — I-STAT CG4 LACTIC ACID, ED: Lactic Acid, Venous: 1.18 mmol/L (ref 0.5–1.9)

## 2018-04-08 MED ORDER — VANCOMYCIN HCL IN DEXTROSE 1-5 GM/200ML-% IV SOLN: 1000.0000 mg | Freq: Once | INTRAVENOUS | Status: DC

## 2018-04-08 MED ORDER — SODIUM CHLORIDE 0.45 % IV SOLN
INTRAVENOUS | Status: DC
  Administered 2018-04-09: 10:00:00 via INTRAVENOUS

## 2018-04-08 MED ORDER — SODIUM CHLORIDE 0.9 % IV BOLUS
3000.0000 mL | Freq: Once | INTRAVENOUS | Status: AC
  Administered 2018-04-08: 3000 mL via INTRAVENOUS

## 2018-04-08 MED ORDER — VANCOMYCIN HCL 10 G IV SOLR
1500.0000 mg | Freq: Once | INTRAVENOUS | Status: AC
  Administered 2018-04-08: 1500 mg via INTRAVENOUS
  Filled 2018-04-08: qty 1500

## 2018-04-08 MED ORDER — ONDANSETRON HCL 4 MG/2ML IJ SOLN: 4.0000 mg | Freq: Four times a day (QID) | INTRAMUSCULAR | Status: DC | PRN

## 2018-04-08 MED ORDER — ACETAMINOPHEN 650 MG RE SUPP
650.0000 mg | Freq: Four times a day (QID) | RECTAL | Status: DC | PRN
  Administered 2018-04-11 – 2018-04-12 (×3): 650 mg via RECTAL
  Filled 2018-04-08 (×3): qty 1

## 2018-04-08 MED ORDER — LORAZEPAM 2 MG/ML IJ SOLN
0.5000 mg | INTRAMUSCULAR | Status: DC | PRN
  Administered 2018-04-12: 0.5 mg via INTRAVENOUS
  Filled 2018-04-08: qty 1

## 2018-04-08 MED ORDER — ALLOPURINOL 300 MG PO TABS
300.0000 mg | ORAL_TABLET | Freq: Every day | ORAL | Status: DC
  Filled 2018-04-08 (×4): qty 1

## 2018-04-08 MED ORDER — PANTOPRAZOLE SODIUM 40 MG PO TBEC
40.0000 mg | DELAYED_RELEASE_TABLET | Freq: Every day | ORAL | Status: DC
  Filled 2018-04-08 (×5): qty 1

## 2018-04-08 MED ORDER — ENSURE ENLIVE PO LIQD: 237.0000 mL | Freq: Two times a day (BID) | ORAL | Status: DC

## 2018-04-08 MED ORDER — ONDANSETRON HCL 4 MG PO TABS: 4.0000 mg | ORAL_TABLET | Freq: Four times a day (QID) | ORAL | Status: DC | PRN

## 2018-04-08 MED ORDER — LEVOTHYROXINE SODIUM 50 MCG PO TABS: 150.0000 ug | ORAL_TABLET | Freq: Every day | ORAL | Status: DC

## 2018-04-08 MED ORDER — ACETAMINOPHEN 325 MG PO TABS: 650.0000 mg | ORAL_TABLET | Freq: Four times a day (QID) | ORAL | Status: DC | PRN

## 2018-04-08 MED ORDER — SODIUM CHLORIDE 0.9 % IV SOLN
1.0000 g | INTRAVENOUS | Status: DC
  Administered 2018-04-08: 1 g via INTRAVENOUS
  Filled 2018-04-08 (×4): qty 10

## 2018-04-08 MED ORDER — PIPERACILLIN-TAZOBACTAM 3.375 G IVPB 30 MIN
3.3750 g | Freq: Once | INTRAVENOUS | Status: AC
  Administered 2018-04-08: 3.375 g via INTRAVENOUS
  Filled 2018-04-08: qty 50

## 2018-04-08 NOTE — Progress Notes (Signed)
Pharmacy Note:  Initial antibiotic(s) regimen of vancomycin and zosyn ordered by EDP to treat sepsis.  Estimated Creatinine Clearance: 43.8 mL/min (A) (by C-G formula based on SCr of 1.3 mg/dL (H)).   No Known Allergies  There were no vitals filed for this visit.  Anti-infectives (From admission, onward)   Start     Dose/Rate Route Frequency Ordered Stop   04/08/18 1530  vancomycin (VANCOCIN) IVPB 1000 mg/200 mL premix  Status:  Discontinued     1,000 mg 200 mL/hr over 60 Minutes Intravenous  Once 04/08/18 1515 04/08/18 1519   04/08/18 1530  piperacillin-tazobactam (ZOSYN) IVPB 3.375 g     3.375 g 100 mL/hr over 30 Minutes Intravenous  Once 04/08/18 1515     04/08/18 1530  vancomycin (VANCOCIN) 1,500 mg in sodium chloride 0.9 % 500 mL IVPB     1,500 mg 250 mL/hr over 120 Minutes Intravenous  Once 04/08/18 1519        Plan: Initial dose(s) of Vancomycin 1500mg  IV x 1 and zosyn 3.375g IV X 1 ordered. F/U admission orders for further dosing if therapy continued.  Isac Sarna, BS [place], [place] Clinical Pharmacist Pager [phone] 04/08/2018 3:25 PM

## 2018-04-08 NOTE — ED Provider Notes (Signed)
[facility] EMERGENCY DEPARTMENT Provider Note   CSN: [id number] Arrival date & time: 04/08/18  1512     History   Chief Complaint Chief Complaint  Patient presents with  . Code Sepsis    HPI Omar Lee is a 77 y.o. male.  Patient was brought over from the nursing home because he became unresponsive.  He also had labs drawn this morning that showed him to be severely dehydrated and a white count of 25,000.  The history is provided by the nursing home. No language interpreter was used.  Illness  This is a new problem. The current episode started 12 to 24 hours ago. The problem occurs constantly. The problem has not changed since onset.Pertinent negatives include no chest pain. Nothing aggravates the symptoms. Nothing relieves the symptoms. He has tried nothing for the symptoms. The treatment provided no relief.    Past Medical History:  Diagnosis Date  . 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 Frequent falls   . Hyperlipidemia   . Hypothyroidism   . Memory loss   . Reflux   . Sleep apnea     Patient Active Problem List   Diagnosis Date Noted  . AKI (acute kidney injury) ([place]) 04/08/2018  . Inadequate oral intake 04/08/2018  . Leukocytosis 04/08/2018  . Dehydration 03/20/2018  . Orthostatic hypotension 03/20/2018  . Pancytopenia ([place]) 03/20/2018  . Generalized weakness   . Multiple falls 03/19/2018  . Dementia ([place]) 03/13/2018  . 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 Goals of care, counseling/discussion 08/13/2017  . Short-term memory loss 06/17/2017  . 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 Anemia 07/21/2016  . GERD (gastroesophageal reflux disease) 01/20/2013  . Barrett's esophagus with esophagitis 01/20/2013  . Hypothyroidism 09/27/2012    Past Surgical History:  Procedure Laterality Date  . ESOPHAGOGASTRODUODENOSCOPY (EGD) WITH ESOPHAGEAL DILATION N/A 11/22/2012   Procedure: ESOPHAGOGASTRODUODENOSCOPY (EGD) WITH  ESOPHAGEAL DILATION;  Surgeon: Rogene Houston, MD;  Location[facility]O [facility];  Service: Endoscopy;  Laterality: N/A;  1120  . HEMORRHOID SURGERY    . NASAL SEPTUM SURGERY    . PORTACATH PLACEMENT Left 11/02/2017   Procedure: INSERTION PORT WITH ATTACHED CATHETER LEFT SUBCLAVIAN;  Surgeon: Aviva Signs, MD;  Location: [facility] ORS;  Service: General;  Laterality: Left;  . SHOULDER SURGERY Right   . TONSILLECTOMY AND ADENOIDECTOMY          Home Medications    Prior to Admission medications   Medication Sig Start Date End Date Taking? Authorizing Provider  allopurinol (ZYLOPRIM) 300 MG tablet Take 1 tablet (300 mg total) by mouth daily. 09/06/17   Derek Jack, MD  buPROPion (WELLBUTRIN SR) 150 MG 12 hr tablet Start taking the medication one po Q am for the first three days, then start taking one po BID. 03/12/18   [name], MD  feeding supplement, ENSURE ENLIVE, (ENSURE ENLIVE) LIQD Take 237 mLs by mouth 2 (two) times daily between meals. 03/25/18   Orson Eva, MD  levothyroxine (SYNTHROID, LEVOTHROID) 150 MCG tablet Take 1 tablet (150 mcg total) by mouth daily. 03/25/18   Orson Eva, MD  pantoprazole (PROTONIX) 40 MG tablet  08/04/17   [provider]    Family History Family History  Problem Relation Age of Onset  . Hypertension Mother   . Heart attack Mother   . Heart attack Father   . Diabetes Brother     Social History Social History   Tobacco Use  .  Smoking status: Never Smoker  . Smokeless tobacco: Never Used  Substance Use Topics  . Alcohol use: No  . Drug use: No     Allergies   Patient has no known allergies.   Review of Systems Review of Systems  Unable to perform ROS: Mental status change  Cardiovascular: Negative for chest pain.     Physical Exam Updated Vital Signs BP 140/85   Pulse 99   Temp 99 F (37.2 C)   Resp 20   Ht 5\' 7"  (1.702 m)   Wt 73.5 kg   SpO2 98%   BMI 25.37 kg/m   Physical Exam Constitutional:       Appearance: He is well-developed.     Comments: Lethargic  HENT:     Head: Normocephalic.     Nose: Nose normal.     Mouth/Throat:     Mouth: Mucous membranes are dry.  Eyes:     General: No scleral icterus.    Conjunctiva/sclera: Conjunctivae normal.  Neck:     Musculoskeletal: Neck supple.     Thyroid: No thyromegaly.  Cardiovascular:     Rate and Rhythm: Normal rate and regular rhythm.     Heart sounds: No murmur. No friction rub. No gallop.   Pulmonary:     Breath sounds: No stridor. No wheezing or rales.  Chest:     Chest wall: No tenderness.  Abdominal:     General: There is no distension.     Tenderness: There is no abdominal tenderness. There is no rebound.  Musculoskeletal: Normal range of motion.  Lymphadenopathy:     Cervical: No cervical adenopathy.  Skin:    Findings: No erythema or rash.  Neurological:     Motor: No abnormal muscle tone.     Coordination: Coordination normal.     Comments: Lethargic and will not follow commands or speak      ED Treatments / Results  Labs (all labs ordered are listed, but only abnormal results are displayed) Labs Reviewed  COMPREHENSIVE METABOLIC PANEL - Abnormal; Notable for the following components:      Result Value   Chloride 114 (*)    Glucose, Bld 134 (*)    BUN 51 (*)    Creatinine, Ser 1.35 (*)    GFR calc non Af Amer 50 (*)    GFR calc Af Amer 58 (*)    All other components within normal limits  CBC WITH DIFFERENTIAL/PLATELET - Abnormal; Notable for the following components:   WBC 25.1 (*)    RBC 2.66 (*)    Hemoglobin 8.8 (*)    HCT 28.6 (*)    MCV 107.5 (*)    RDW 18.6 (*)    Platelets 59 (*)    nRBC 1.3 (*)    Neutro Abs 10.1 (*)    Lymphs Abs 4.1 (*)    Monocytes Absolute 9.0 (*)    Basophils Absolute 0.2 (*)    Abs Immature Granulocytes 1.64 (*)    All other components within normal limits  URINALYSIS, ROUTINE W REFLEX MICROSCOPIC - Abnormal; Notable for the following components:   APPearance  HAZY (*)    Hgb urine dipstick SMALL (*)    Protein, ur 100 (*)    Leukocytes, UA MODERATE (*)    WBC, UA >50 (*)    Bacteria, UA RARE (*)    All other components within normal limits  I-STAT CHEM 8, ED - Abnormal; Notable for the following components:   Sodium 147 (*)  Chloride 112 (*)    BUN 42 (*)    Creatinine, Ser 1.30 (*)    Glucose, Bld 133 (*)    Hemoglobin 8.8 (*)    HCT 26.0 (*)    All other components within normal limits  CULTURE, BLOOD (ROUTINE X 2)  CULTURE, BLOOD (ROUTINE X 2)  URINE CULTURE  I-STAT CG4 LACTIC ACID, ED  I-STAT TROPONIN, ED  I-STAT CG4 LACTIC ACID, ED    EKG None  Radiology Dg Chest Portable 1 View  Result Date: 04/08/2018 CLINICAL DATA:  Patient unresponsive today. EXAM: PORTABLE CHEST 1 VIEW COMPARISON:  Single-view of the chest 03/19/2018. PA and lateral chest 11/20/2012. FINDINGS: Port-A-Cath is in place. Lung volumes are somewhat low but the lungs are clear. Heart size is normal. Aortic atherosclerosis is noted. No pneumothorax or pleural fluid. IMPRESSION: No acute disease. Atherosclerosis. Electronically Signed   By: Inge Rise M.D.   On: 04/08/2018 15:40    Procedures Procedures (including critical care time)  Medications Ordered in ED Medications  vancomycin (VANCOCIN) 1,500 mg in sodium chloride 0.9 % 500 mL IVPB (1,500 mg Intravenous New Bag/Given 04/08/18 1557)  sodium chloride 0.9 % bolus 3,000 mL (0 mLs Intravenous Stopped 04/08/18 1719)  piperacillin-tazobactam (ZOSYN) IVPB 3.375 g (0 g Intravenous Stopped 04/08/18 1606)     Initial Impression / Assessment and Plan / ED Course  I have reviewed the triage vital signs and the nursing notes.  Pertinent labs & imaging results that were available during my care of the patient were reviewed by me and considered in my medical decision making (see chart for details). CRITICAL CARE Performed by: Milton Ferguson Total critical care time: 40 minutes Critical care time was  exclusive of separately billable procedures and treating other patients. Critical care was necessary to treat or prevent imminent or life-threatening deterioration. Critical care was time spent personally by me on the following activities: development of treatment plan with patient and/or surrogate as well as nursing, discussions with consultants, evaluation of patient's response to treatment, examination of patient, obtaining history from patient or surrogate, ordering and performing treatments and interventions, ordering and review of laboratory studies, ordering and review of radiographic studies, pulse oximetry and re-evaluation of patient's condition.     With severe dehydration and urinary tract infection.  Cipro protocol applied.  Patient improved with fluids and would be able to say some words.  He will be admitted to medicine.  Patient is a DNR  Final Clinical Impressions(s) / ED Diagnoses   Final diagnoses:  Acute cystitis without hematuria    ED Discharge Orders    None       Milton Ferguson, MD 04/08/18 1729

## 2018-04-08 NOTE — H&P (Signed)
History and Physical  Omar Lee UYQ:[id number] DOB: 1940-07-03 DOA: 04/08/2018  Referring physician: Roderic Palau MD PCP: [name], MD   Chief Complaint: unresponsive  Historian: Pt unable to participate due to dementia, information taken from ED provider, RN and records.   HPI: Omar Lee is a 77 y.o. male resident of [facility] who was recently discharged for frequent falls and dehydration.  He was noted at SNF to be unresponsive.  He was also noted to have some abnormal labs.  He was noted to be febrile on arrival with with sinus tachycardia noted.  He apparently has had poor oral intake over the past several days.  There has been some fever noted.  On arrival the patient was started on a sepsis protocol.  He was given IV fluids and started on broad-spectrum antibiotics.  He responded very well to the IV fluids.  He became more alert and vocalizing.  He remains confused with dementia.  He was noted to have an acute kidney injury.  He was clinically noted to be dehydrated as well.  His white blood cell count was markedly elevated at 25.1.  He had noticeable thrombocytopenia with a platelet count of 59.  He has an indwelling Foley catheter.  His urinalysis was markedly abnormal with greater than 50 white blood cells per high-power field noted.  A urine culture is pending.  The patient was noted to have some sinus tachycardia on admission but a normal blood pressure.  He was not hypoxic and no acute findings noted on chest x-ray.  The patient has a history of myelodysplastic syndrome/AML and has been followed by hematology oncology.  Also of note the patient was supposed to have outpatient neurology follow-up for a ventriculomegaly that was noted on a CT brain from 03/21/2018.  Review of Systems: Unable to obtain due to dementia.  Past Medical History:  Diagnosis Date  . 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 Frequent falls   . Hyperlipidemia   . Hypothyroidism   . Memory loss   . Reflux     . Sleep apnea    Past Surgical History:  Procedure Laterality Date  . ESOPHAGOGASTRODUODENOSCOPY (EGD) WITH ESOPHAGEAL DILATION N/A 11/22/2012   Procedure: ESOPHAGOGASTRODUODENOSCOPY (EGD) WITH ESOPHAGEAL DILATION;  Surgeon: Rogene Houston, MD;  Location[facility]O [facility];  Service: Endoscopy;  Laterality: N/A;  1120  . HEMORRHOID SURGERY    . NASAL SEPTUM SURGERY    . PORTACATH PLACEMENT Left 11/02/2017   Procedure: INSERTION PORT WITH ATTACHED CATHETER LEFT SUBCLAVIAN;  Surgeon: Aviva Signs, MD;  Location: [facility] ORS;  Service: General;  Laterality: Left;  . SHOULDER SURGERY Right   . TONSILLECTOMY AND ADENOIDECTOMY     Social History:  reports that he has never smoked. He has never used smokeless tobacco. He reports that he does not drink alcohol or use drugs.  No Known Allergies  Family History  Problem Relation Age of Onset  . Hypertension Mother   . Heart attack Mother   . Heart attack Father   . Diabetes Brother     Prior to Admission medications   Medication Sig Start Date End Date Taking? Authorizing Provider  allopurinol (ZYLOPRIM) 300 MG tablet Take 1 tablet (300 mg total) by mouth daily. 09/06/17   Derek Jack, MD  buPROPion (WELLBUTRIN SR) 150 MG 12 hr tablet Start taking the medication one po Q am for the first three days, then start taking one po BID. 03/12/18   Baltazar Apo  S, MD  feeding supplement, ENSURE ENLIVE, (ENSURE ENLIVE) LIQD Take 237 mLs by mouth 2 (two) times daily between meals. 03/25/18   Orson Eva, MD  levothyroxine (SYNTHROID, LEVOTHROID) 150 MCG tablet Take 1 tablet (150 mcg total) by mouth daily. 03/25/18   Orson Eva, MD  pantoprazole (PROTONIX) 40 MG tablet  08/04/17   [provider]   Physical Exam: Vitals:   04/08/18 1518 04/08/18 1530 04/08/18 1545 04/08/18 1608  BP:  133/88  140/85  Pulse:  (!) 102 91 99  Resp:  (!) 23 (!) 28 20  Temp:  99.7 F (37.6 C) 99.3 F (37.4 C) 99 F (37.2 C)  SpO2:  99% 98% 98%  Weight: 73.5  kg     Height: 5\' 7"  (1.702 m)        General exam: Moderately built and nourished patient, lying comfortably supine on the gurney in no obvious distress.  He is very confused with dementia.  He is vocalizing.  He is able to answer simple questions.  Head, eyes and ENT: Nontraumatic and normocephalic. Pupils equally reacting to light and accommodation. Oral mucosa very dry.  Neck: Supple. No JVD, carotid bruit or thyromegaly.  Lymphatics: No lymphadenopathy.  Respiratory system: Clear to auscultation. No increased work of breathing.  Cardiovascular system: S1 and S2 heard, mild tachycardia. No JVD, murmurs, gallops, clicks or pedal edema.  Gastrointestinal system: Abdomen is nondistended, soft and nontender. Normal bowel sounds heard. No organomegaly or masses appreciated.  GU: Foley in place.  Central nervous system: Alert and oriented. No focal neurological deficits.  Extremities: Symmetric 5 x 5 power. Peripheral pulses symmetrically felt.   Skin: No rashes or acute findings.  Musculoskeletal system: Negative exam.  Psychiatry: Normal affect with dementia.  Labs on Admission:  Basic Metabolic Panel: Recent Labs  Lab 04/08/18 1515 04/08/18 1522  NA 144 147*  K 4.4 4.4  CL 114* 112*  CO2 23  --   GLUCOSE 134* 133*  BUN 51* 42*  CREATININE 1.35* 1.30*  CALCIUM 9.0  --    Liver Function Tests: Recent Labs  Lab 04/08/18 1515  AST 32  ALT 33  ALKPHOS 60  BILITOT 0.6  PROT 7.0  ALBUMIN 3.6   No results for input(s): LIPASE, AMYLASE in the last 168 hours. No results for input(s): AMMONIA in the last 168 hours. CBC: Recent Labs  Lab 04/08/18 1515 04/08/18 1522  WBC 25.1*  --   NEUTROABS 10.1*  --   HGB 8.8* 8.8*  HCT 28.6* 26.0*  MCV 107.5*  --   PLT 59*  --    Cardiac Enzymes: No results for input(s): CKTOTAL, CKMB, CKMBINDEX, TROPONINI in the last 168 hours.  BNP (last 3 results) No results for input(s): PROBNP in the last 8760 hours. CBG: No  results for input(s): GLUCAP in the last 168 hours.  Radiological Exams on Admission: Dg Chest Portable 1 View  Result Date: 04/08/2018 CLINICAL DATA:  Patient unresponsive today. EXAM: PORTABLE CHEST 1 VIEW COMPARISON:  Single-view of the chest 03/19/2018. PA and lateral chest 11/20/2012. FINDINGS: Port-A-Cath is in place. Lung volumes are somewhat low but the lungs are clear. Heart size is normal. Aortic atherosclerosis is noted. No pneumothorax or pleural fluid. IMPRESSION: No acute disease. Atherosclerosis. Electronically Signed   By: Inge Rise M.D.   On: 04/08/2018 15:40    EKG: Independently reviewed.  Sinus tachycardia Chest x-ray: No acute findings.  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 Sepsis-secondary to UTI- treating supportively with IV fluids.  He has been bolused IV fluids with improvement in mental status.  Continue supportive IV fluid hydration.  Continue IV antibiotics and follow urine culture.  Follow blood cultures. 2. Acute kidney injury-secondary to dehydration, treating with IV fluid support. 3. Generalized weakness-multifactorial secondary to debility and acute illness.  Treating acute illness as noted above. 4. Frequent falls-fall precautions ordered.  Return to skilled nursing facility when medically stabilized. 5. Leukocytosis-suspect this is all reactive leukocytosis secondary to the acute sepsis and UTI as he did have a low  white blood cell count earlier in the month.  Will follow CBC with differential. 6. Hypernatremia-suspect secondary to dehydration, change fluids to half-normal saline and plan to repeat BMP in the morning. 7. Dehydration-treating with IV fluids.   Encourage oral intake.  Assist with feeding order. 8. UTI- IV ceftriaxone ordered, follow urine culture and sensitivity.   9. Hypothyroidism-resume home levothyroxine. 10. Thrombocytopenia-likely secondary to MDS, holding heparin products.  Follow CBC. 11. Dementia- patient is high risk for acute delirium and noted during last hospitalization to require medications for sedation.  Monitor closely.  Lorazepam ordered as needed.  If not effective will consider haloperidol. 12. GERD- stable.  Protonix. 13. Anemia due to myelodysplastic syndrome-hemoglobin is stable from recent hospitalization. 14. Ventriculomegaly - noted on CT brain from 03/21/18: It was discussed with Dr. Merlene Laughter and he had recommended outpatient follow up with neurology.     DVT Prophylaxis: SCD Code Status: DNR Family Communication: N/A Disposition Plan: SNF when medically stable  Time spent: 60 minutes  Omar Brakeman, MD [facility] Pager [phone]  If 7PM-7AM, please contact night-coverage [email or web address] Password [facility] 04/08/2018, 5:26 PM

## 2018-04-08 NOTE — ED Triage Notes (Signed)
Per EMS patient from [facility]. Patient unresponsive ems, no report called by nursing home. EMS states patient was sent for abnormal labs. Patient febrile with kussmaul respirations.

## 2018-04-09 ENCOUNTER — Ambulatory Visit (HOSPITAL_COMMUNITY): Payer: Medicare HMO

## 2018-04-09 ENCOUNTER — Inpatient Hospital Stay (HOSPITAL_COMMUNITY)
Admit: 2018-04-09 | Discharge: 2018-04-09 | Disposition: A | Discharge status: Home / Self Care | Payer: Medicare HMO | Attending: Internal Medicine | Admitting: Internal Medicine

## 2018-04-09 ENCOUNTER — Observation Stay (HOSPITAL_COMMUNITY): Payer: Medicare HMO

## 2018-04-09 ENCOUNTER — Inpatient Hospital Stay (HOSPITAL_COMMUNITY): Payer: Medicare HMO

## 2018-04-09 DIAGNOSIS — A419 Sepsis, unspecified organism: Secondary | ICD-10-CM | POA: Diagnosis present

## 2018-04-09 DIAGNOSIS — G9340 Encephalopathy, unspecified: Secondary | ICD-10-CM

## 2018-04-09 DIAGNOSIS — D464 Refractory anemia, unspecified: Secondary | ICD-10-CM

## 2018-04-09 DIAGNOSIS — R6520 Severe sepsis without septic shock: Secondary | ICD-10-CM

## 2018-04-09 LAB — CBC WITH DIFFERENTIAL/PLATELET
Abs Immature Granulocytes: 1.49 10*3/uL — ABNORMAL HIGH (ref 0.00–0.07)
BASOS PCT: 0 %
Basophils Absolute: 0.1 10*3/uL (ref 0.0–0.1)
EOS ABS: 0 10*3/uL (ref 0.0–0.5)
Eosinophils Relative: 0 %
HCT: 24.1 % — ABNORMAL LOW (ref 39.0–52.0)
Hemoglobin: 7.3 g/dL — ABNORMAL LOW (ref 13.0–17.0)
Immature Granulocytes: 7 %
Lymphocytes Relative: 14 %
Lymphs Abs: 3.1 10*3/uL (ref 0.7–4.0)
MCH: 33.8 pg (ref 26.0–34.0)
MCHC: 30.3 g/dL (ref 30.0–36.0)
MCV: 111.6 fL — ABNORMAL HIGH (ref 80.0–100.0)
Monocytes Absolute: 7 10*3/uL — ABNORMAL HIGH (ref 0.1–1.0)
Monocytes Relative: 32 %
Neutro Abs: 10.4 10*3/uL — ABNORMAL HIGH (ref 1.7–7.7)
Neutrophils Relative %: 47 %
PLATELETS: 44 10*3/uL — AB (ref 150–400)
RBC: 2.16 MIL/uL — ABNORMAL LOW (ref 4.22–5.81)
RDW: 18.7 % — AB (ref 11.5–15.5)
WBC: 22 10*3/uL — ABNORMAL HIGH (ref 4.0–10.5)
nRBC: 1 % — ABNORMAL HIGH (ref 0.0–0.2)

## 2018-04-09 LAB — BLOOD GAS, ARTERIAL
Acid-Base Excess: 0.3 mmol/L (ref 0.0–2.0)
Bicarbonate: 24.7 mmol/L (ref 20.0–28.0)
Drawn by: 270161
FIO2: 21
O2 Saturation: 94.8 %
PCO2 ART: 38.2 mmHg (ref 32.0–48.0)
PH ART: 7.41 (ref 7.350–7.450)
Patient temperature: 37
pO2, Arterial: 80.2 mmHg — ABNORMAL LOW (ref 83.0–108.0)

## 2018-04-09 LAB — BASIC METABOLIC PANEL
Anion gap: 5 (ref 5–15)
BUN: 38 mg/dL — ABNORMAL HIGH (ref 8–23)
CO2: 23 mmol/L (ref 22–32)
Calcium: 8.3 mg/dL — ABNORMAL LOW (ref 8.9–10.3)
Chloride: 121 mmol/L — ABNORMAL HIGH (ref 98–111)
Creatinine, Ser: 1.18 mg/dL (ref 0.61–1.24)
GFR calc Af Amer: >60 mL/min (ref >60)
GFR calc non Af Amer: 59 mL/min — ABNORMAL LOW (ref >60)
Glucose, Bld: 124 mg/dL — ABNORMAL HIGH (ref 70–99)
POTASSIUM: 3.7 mmol/L (ref 3.5–5.1)
Sodium: 149 mmol/L — ABNORMAL HIGH (ref 135–145)

## 2018-04-09 LAB — MAGNESIUM: Magnesium: 2.5 mg/dL — ABNORMAL HIGH (ref 1.7–2.4)

## 2018-04-09 LAB — AMMONIA: Ammonia: 22 umol/L (ref 9–35)

## 2018-04-09 LAB — PREPARE RBC (CROSSMATCH)

## 2018-04-09 MED ORDER — SODIUM CHLORIDE 0.9% IV SOLUTION
Freq: Once | INTRAVENOUS | Status: AC
  Administered 2018-04-09: 12:00:00 via INTRAVENOUS

## 2018-04-09 MED ORDER — LEVETIRACETAM IN NACL 500 MG/100ML IV SOLN: 500.0000 mg | Freq: Two times a day (BID) | INTRAVENOUS | Status: DC

## 2018-04-09 MED ORDER — SODIUM CHLORIDE 0.9 % IV SOLN
3.0000 g | Freq: Three times a day (TID) | INTRAVENOUS | Status: DC
  Administered 2018-04-10 – 2018-04-11 (×5): 3 g via INTRAVENOUS
  Filled 2018-04-09 (×11): qty 3

## 2018-04-09 MED ORDER — VANCOMYCIN HCL 10 G IV SOLR
1500.0000 mg | Freq: Once | INTRAVENOUS | Status: AC
  Administered 2018-04-09: 1500 mg via INTRAVENOUS
  Filled 2018-04-09 (×2): qty 1500

## 2018-04-09 MED ORDER — ACYCLOVIR SODIUM 50 MG/ML IV SOLN
INTRAVENOUS | Status: AC
  Filled 2018-04-09: qty 10

## 2018-04-09 MED ORDER — POTASSIUM CL IN DEXTROSE 5% 20 MEQ/L IV SOLN
20.0000 meq | INTRAVENOUS | Status: DC
  Administered 2018-04-10 (×2): 20 meq via INTRAVENOUS
  Filled 2018-04-09 (×9): qty 1000

## 2018-04-09 MED ORDER — POTASSIUM CL IN DEXTROSE 5% 20 MEQ/L IV SOLN
20.0000 meq | INTRAVENOUS | Status: DC
  Administered 2018-04-09: 20 meq via INTRAVENOUS
  Filled 2018-04-09 (×6): qty 1000

## 2018-04-09 MED ORDER — VANCOMYCIN HCL IN DEXTROSE 1-5 GM/200ML-% IV SOLN
1000.0000 mg | INTRAVENOUS | Status: DC
  Administered 2018-04-11: 1000 mg via INTRAVENOUS

## 2018-04-09 MED ORDER — POTASSIUM CHLORIDE 2 MEQ/ML IV SOLN: INTRAVENOUS | Status: DC

## 2018-04-09 MED ORDER — SODIUM CHLORIDE 0.9 % IV SOLN
2.0000 g | Freq: Two times a day (BID) | INTRAVENOUS | Status: DC
  Administered 2018-04-10 – 2018-04-11 (×4): 2 g via INTRAVENOUS
  Filled 2018-04-09 (×2): qty 20
  Filled 2018-04-09: qty 2
  Filled 2018-04-09: qty 20
  Filled 2018-04-09: qty 2
  Filled 2018-04-09: qty 20
  Filled 2018-04-09: qty 2
  Filled 2018-04-09: qty 20

## 2018-04-09 MED ORDER — LEVETIRACETAM IN NACL 1000 MG/100ML IV SOLN
1000.0000 mg | INTRAVENOUS | Status: AC
  Administered 2018-04-09: 1000 mg via INTRAVENOUS
  Filled 2018-04-09: qty 100

## 2018-04-09 MED ORDER — DEXTROSE 5 % IV SOLN
10.0000 mg/kg | Freq: Two times a day (BID) | INTRAVENOUS | Status: DC
  Administered 2018-04-09 – 2018-04-11 (×4): 660 mg via INTRAVENOUS
  Filled 2018-04-09 (×6): qty 13.2

## 2018-04-09 NOTE — Progress Notes (Signed)
PROGRESS NOTE    Omar Lee  ZOX:[id number] DOB: [birth year] DOA: 04/08/2018 PCP: [name], MD    Brief Narrative:  77 year old male with a history of myelodysplastic syndrome, cognitive deficits who is currently at a skilled nursing facility, brought to the hospital with change in mental status over 2 to 3 days prior to admission.  Patient was increasingly lethargic and not responding.  He was found to have leukocytosis of 25,000.  Urinalysis indicated possible infection.  He is being treated with intravenous antibiotics and IV fluids.  He was also clinically dehydrated.  Overall mental status has failed to improve.  Antibiotics have been broadened to cover CNS infections.  Lumbar puncture will be challenging in the setting of thrombocytopenia and altered mental status.   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 Sepsis.  Unclear etiology.  Treating for possible UTI.  Other etiologies include possible CNS infection.  Currently on intravenous antibiotics.  He is also receiving IV fluid.  Blood cultures have shown no growth.  Urine culture in process.  Continues to have a high WBC count. 2. Acute toxic encephalopathy superimposed on baseline dementia.  Patient is normally able to converse, but is confused.  Currently, he is lethargic, somnolent and does not follow commands or answer questions.  He does have periodic jerking movements, which are asymmetrical.  This is currently not his baseline.  Imaging including MRI brain did not show any new findings.  ABG also unrevealing.  He recently had RPR checked which was found to be negative.  Other etiologies to consider would be CNS  infection.  Unfortunately with his mental status and periodic jerking as well as concurrent thrombocytopenia, obtaining a lumbar puncture will be quite challenging.  This was discussed with [name] who wishes to hold off on lumbar puncture for now.  We will continue with empiric treatment for CNS infections.  Start vancomycin, ampicillin, acyclovir.  Continue ceftriaxone.  If mental status stabilizes in the next 24 hours, can consider pursuing lumbar puncture. 3. Hypernatremia.  Related to dehydration/IV saline.  Change fluids to hypotonic fluids 4. Thrombocytopenia.  Related to myelodysplastic syndrome.  Continue to follow. 5. 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  Will transfuse 1 unit of PRBC. 6. Dehydration.  Treated with IV fluids. 7. Possible UTI.  Currently on ceftriaxone.  Follow-up urine culture 8. Tracheomegaly.  Noted on imaging.  Plans are for outpatient follow-up with neurology. 9. Acute kidney injury.  Related to dehydration.  Resolved with IV fluids. 10. Hypothyroidism.  Chronically on Synthroid.  This is on hold while he is acutely ill since he is unable to take it by mouth.   DVT prophylaxis: SCDs Code Status: DNR Family Communication: Discussed with [name] who the patient had previously assigned to assist in his care and help make decisions for him. Disposition Plan: Return to nursing facility on discharge once mental status is better.   Consultants:     Procedures:     Antimicrobials:   Ceftriaxone 12/23 >   Subjective: Patient is somnolent, does not answer questions or follow commands  Objective: Vitals:   04/09/18 0544 04/09/18 1433 04/09/18 1455 04/09/18  1736  BP: 121/77 130/63 118/67 131/74  Pulse: 97 98 93 89  Resp: 18 18 20 20   Temp: 98.8 F (37.1 C) 99 F (37.2 C) 98.3 F (36.8 C) 98.3 F (36.8 C)  TempSrc: Oral Oral Axillary Axillary  SpO2: 93% 98% 100% 96%  Weight:      Height:        Intake/Output Summary (Last 24 hours)  at 04/09/2018 1943 Last data filed at 04/09/2018 1800 Gross per 24 hour  Intake 2668.94 ml  Output 1 ml  Net 2667.94 ml   Filed Weights   04/08/18 1518 04/08/18 1829  Weight: 73.5 kg 65.8 kg    Examination:  General exam: Lethargic, does not respond to voice Respiratory system: Clear to auscultation. Respiratory effort normal. Cardiovascular system: S1 & S2 heard, RRR. No JVD, murmurs, rubs, gallops or clicks. No pedal edema. Gastrointestinal system: Abdomen is nondistended, soft and nontender. No organomegaly or masses felt. Normal bowel sounds heard. Central nervous system: No focal neurological deficits. Extremities: Symmetric  Skin: No rashes, lesions or ulcers Psychiatry: Somnolent/lethargic, does not answer questions    Data Reviewed: I have personally reviewed following labs and imaging studies  CBC: Recent Labs  Lab 04/08/18 1515 04/08/18 1522 04/09/18 0550  WBC 25.1*  --  22.0*  NEUTROABS 10.1*  --  10.4*  HGB 8.8* 8.8* 7.3*  HCT 28.6* 26.0* 24.1*  MCV 107.5*  --  111.6*  PLT 59*  --  44*   Basic Metabolic Panel: Recent Labs  Lab 04/08/18 1515 04/08/18 1522 04/09/18 0550  NA 144 147* 149*  K 4.4 4.4 3.7  CL 114* 112* 121*  CO2 23  --  23  GLUCOSE 134* 133* 124*  BUN 51* 42* 38*  CREATININE 1.35* 1.30* 1.18  CALCIUM 9.0  --  8.3*  MG  --   --  2.5*   GFR: Estimated Creatinine Clearance: 48 mL/min (by C-G formula based on SCr of 1.18 mg/dL). Liver Function Tests: Recent Labs  Lab 04/08/18 1515  AST 32  ALT 33  ALKPHOS 60  BILITOT 0.6  PROT 7.0  ALBUMIN 3.6   No results for input(s): LIPASE, AMYLASE in the last 168 hours. Recent Labs  Lab 04/09/18 1157  AMMONIA 22   Coagulation Profile: No results for input(s): INR, PROTIME in the last 168 hours. Cardiac Enzymes: No results for input(s): CKTOTAL, CKMB, CKMBINDEX, TROPONINI in the last 168 hours. BNP (last 3 results) No results for input(s): PROBNP in the last 8760  hours. HbA1C: No results for input(s): HGBA1C in the last 72 hours. CBG: No results for input(s): GLUCAP in the last 168 hours. Lipid Profile: No results for input(s): CHOL, HDL, LDLCALC, TRIG, CHOLHDL, LDLDIRECT in the last 72 hours. Thyroid Function Tests: No results for input(s): TSH, T4TOTAL, FREET4, T3FREE, THYROIDAB in the last 72 hours. Anemia Panel: No results for input(s): VITAMINB12, FOLATE, FERRITIN, TIBC, IRON, RETICCTPCT in the last 72 hours. Sepsis Labs: Recent Labs  Lab 04/08/18 1522  LATICACIDVEN 1.18    Recent Results (from the past 240 hour(s))  Blood Culture (routine x 2)     Status: None (Preliminary result)   Collection Time: 04/08/18  3:15 PM  Result Value Ref Range Status   Specimen Description BLOOD LEFT HAND  Final   Special Requests   Final    BOTTLES DRAWN AEROBIC AND ANAEROBIC Blood Culture adequate volume   Culture   Final    NO GROWTH < 24 HOURS Performed at [facility],  [address], [place], [place] [address]    Report Status PENDING  Incomplete  Blood Culture (routine x 2)     Status: None (Preliminary result)   Collection Time: 04/08/18  3:20 PM  Result Value Ref Range Status   Specimen Description BLOOD RIGHT HAND  Final   Special Requests   Final    BOTTLES DRAWN AEROBIC AND ANAEROBIC Blood Culture adequate volume   Culture   Final    NO GROWTH < 24 HOURS Performed at [facility], [address], [place], [place] [address]    Report Status PENDING  Incomplete         Radiology Studies: Mr Virgel Paling KG Contrast  Result Date: 04/09/2018 CLINICAL DATA:  Altered mental status. Patient does not communicate. EXAM: MRI HEAD WITHOUT CONTRAST MRA HEAD WITHOUT CONTRAST TECHNIQUE: Multiplanar, multiecho pulse sequences of the brain and surrounding structures were obtained without intravenous contrast. Angiographic images of the head were obtained using MRA technique without contrast. COMPARISON:  CT head 03/21/2018. FINDINGS: Significant  motion degradation. Study is of marginal diagnostic utility. MRI HEAD FINDINGS Brain: No visible restricted diffusion. Advanced ventriculomegaly affecting the lateral, and third ventricles, greater than fourth ventricle, possible aqueductal stenosis versus normal pressure hydrocephalus. Periventricular T2 and FLAIR hyperintensities suggestive of transependymal absorption. Superimposed cerebral and cerebellar atrophy. No intra-axial mass lesion or extra-axial fluid. Vascular: Reported separately. Skull and upper cervical spine: Unable to assess. Sinuses/Orbits: No visible layering fluid.  No orbital masses. Other: None MRA HEAD FINDINGS There is patency of the internal carotid arteries, and basilar artery. The vertebrals are patent and codominant. There is no intracranial large vessel occlusion affecting the anterior or middle cerebral arteries. Gross patency of the posterior cerebral arteries is established. Unable to assess for possible stenosis. IMPRESSION: Motion degraded exam of marginal diagnostic utility. No visible acute stroke or mass lesion. Advanced ventriculomegaly, likely aqueductal stenosis versus normal pressure hydrocephalus. Major intracranial vessels are patent. Electronically Signed   By: Staci Righter M.D.   On: 04/09/2018 12:52   Mr Brain Wo Contrast  Result Date: 04/09/2018 CLINICAL DATA:  Altered mental status. Patient does not communicate. EXAM: MRI HEAD WITHOUT CONTRAST MRA HEAD WITHOUT CONTRAST TECHNIQUE: Multiplanar, multiecho pulse sequences of the brain and surrounding structures were obtained without intravenous contrast. Angiographic images of the head were obtained using MRA technique without contrast. COMPARISON:  CT head 03/21/2018. FINDINGS: Significant motion degradation. Study is of marginal diagnostic utility. MRI HEAD FINDINGS Brain: No visible restricted diffusion. Advanced ventriculomegaly affecting the lateral, and third ventricles, greater than fourth ventricle,  possible aqueductal stenosis versus normal pressure hydrocephalus. Periventricular T2 and FLAIR hyperintensities suggestive of transependymal absorption. Superimposed cerebral and cerebellar atrophy. No intra-axial mass lesion or extra-axial fluid. Vascular: Reported separately. Skull and upper cervical spine: Unable to assess. Sinuses/Orbits: No visible layering fluid.  No orbital masses. Other: None MRA HEAD FINDINGS There is patency of the internal carotid arteries, and basilar artery. The vertebrals are patent and codominant. There is no intracranial large vessel occlusion affecting the anterior or middle cerebral arteries. Gross patency of the posterior cerebral arteries is established. Unable to assess for possible stenosis. IMPRESSION: Motion degraded exam of marginal diagnostic utility. No visible acute stroke or mass lesion. Advanced ventriculomegaly, likely aqueductal stenosis versus normal pressure hydrocephalus. Major intracranial vessels are patent. Electronically Signed   By: Staci Righter M.D.   On: 04/09/2018 12:52   Portable Chest 1 View  Result Date: 04/09/2018 CLINICAL DATA:  Acute onset of sepsis and  fever. Altered mental status. EXAM: PORTABLE CHEST 1 VIEW COMPARISON:  Chest radiograph performed 04/08/2018 FINDINGS: The lungs are well-aerated. Pulmonary vascularity is at the upper limits of normal. There is no evidence of focal opacification, pleural effusion or pneumothorax. The cardiomediastinal silhouette is within normal limits. A left-sided chest port is noted ending about the distal SVC. No acute osseous abnormalities are seen. IMPRESSION: No acute cardiopulmonary process seen. Electronically Signed   By: Garald Balding M.D.   On: 04/09/2018 05:53   Dg Chest Portable 1 View  Result Date: 04/08/2018 CLINICAL DATA:  Patient unresponsive today. EXAM: PORTABLE CHEST 1 VIEW COMPARISON:  Single-view of the chest 03/19/2018. PA and lateral chest 11/20/2012. FINDINGS: Port-A-Cath is in  place. Lung volumes are somewhat low but the lungs are clear. Heart size is normal. Aortic atherosclerosis is noted. No pneumothorax or pleural fluid. IMPRESSION: No acute disease. Atherosclerosis. Electronically Signed   By: Inge Rise M.D.   On: 04/08/2018 15:40        Scheduled Meds: . allopurinol  300 mg Oral Daily  . feeding supplement (ENSURE ENLIVE)  237 mL Oral BID BM  . levothyroxine  150 mcg Oral Q0600  . pantoprazole  40 mg Oral Daily   Continuous Infusions: . cefTRIAXone (ROCEPHIN)  IV    . dextrose 5 % with KCl 20 mEq / L       LOS: 0 days    Time spent: 74mins    Kathie Dike, MD [facility] Pager [phone]  If 7PM-7AM, please contact night-coverage [email or web address] Password [facility] 04/09/2018, 7:43 PM

## 2018-04-09 NOTE — Progress Notes (Signed)
EEG completed; results pending.    

## 2018-04-09 NOTE — Progress Notes (Signed)
Pharmacy Antibiotic Note  Omar Lee is a 78 y.o. male admitted on 04/08/2018 with CNS infection and sepsis with unclear etiology.  Patient is currently lethargic/somnolent. Pharmacy has been consulted for Vancomycin, Unasyn, and Acyclovir dosing.  Plan: Vancomycin 1500 mg IV x 1 dose. Vancomycin 1000 mg IV every 24 hours.  Goal trough 15-20 mcg/mL.  Acyclovir 660 mg IV every 12 hours. Unasyn 3000 mg IV every 8 hours. Ceftriaxone 2000 mg IV every 12 hours. Monitor labs, c/s, and vanco trough as indicated.  Height: 5\' 7"  (170.2 cm) Weight: 145 lb 1 oz (65.8 kg) IBW/kg (Calculated) : 66.1  Temp (24hrs), Avg:98.7 F (37.1 C), Min:98.3 F (36.8 C), Max:99 F (37.2 C)  Recent Labs  Lab 04/08/18 1515 04/08/18 1522 04/09/18 0550  WBC 25.1*  --  22.0*  CREATININE 1.35* 1.30* 1.18  LATICACIDVEN  --  1.18  --     Estimated Creatinine Clearance: 48 mL/min (by C-G formula based on SCr of 1.18 mg/dL).    No Known Allergies  Antimicrobials this admission: Vanco 12/24 >>  Ceftriaxone 12/24 >>  Acyclovir 12/24 >> Unasyn 12/24 >>  Dose adjustments this admission: Vanco/Acyclovir  Microbiology results: 12/23 BCx: pending 12/23 UCx: pending    Thank you for allowing pharmacy to be a part of this patient's care.  Ramond Craver 04/09/2018 8:31 PM

## 2018-04-09 NOTE — Procedures (Signed)
History: 77 year old male being evaluated for unresponsiveness  Sedation: none  Technique: This is a 21 channel routine scalp EEG performed at the bedside with bipolar and monopolar montages arranged in accordance to the international 10/20 system of electrode placement. One channel was dedicated to EKG recording.    Background: The background consists of a posterior dominant rhythm of 7 Hz which is poorly sustained and poorly formed.  In addition there is overlaid generalized irregular delta and theta activities throughout the study.  Photic stimulation: Physiologic driving is not performed  EEG Abnormalities: 1) generalized irregular slow activity 2) slow posterior dominant rhythm  Clinical Interpretation: This EEG is consistent with a generalized nonspecific cerebral dysfunction (encephalopathy). There was no seizure or seizure predisposition recorded on this study. Please note that lack of epileptiform activity on EEG does not preclude the possibility of epilepsy.   Roland Rack, MD [facility] [phone]  If 7pm- 7am, please page neurology on call as listed in [place].

## 2018-04-10 LAB — BPAM RBC
Blood Product Expiration Date: 202001182359
ISSUE DATE / TIME: 201912241434
UNIT TYPE AND RH: 6200

## 2018-04-10 LAB — TYPE AND SCREEN
ABO/RH(D): A POS
Antibody Screen: NEGATIVE
Unit division: 0

## 2018-04-10 LAB — CBC WITH DIFFERENTIAL/PLATELET
Abs Immature Granulocytes: 1.36 10*3/uL — ABNORMAL HIGH (ref 0.00–0.07)
Basophils Absolute: 0.1 10*3/uL (ref 0.0–0.1)
Basophils Relative: 1 %
Eosinophils Absolute: 0.1 10*3/uL (ref 0.0–0.5)
Eosinophils Relative: 0 %
HCT: 26.4 % — ABNORMAL LOW (ref 39.0–52.0)
Hemoglobin: 8.2 g/dL — ABNORMAL LOW (ref 13.0–17.0)
Immature Granulocytes: 7 %
Lymphocytes Relative: 13 %
Lymphs Abs: 2.7 10*3/uL (ref 0.7–4.0)
MCH: 32.3 pg (ref 26.0–34.0)
MCHC: 31.1 g/dL (ref 30.0–36.0)
MCV: 103.9 fL — AB (ref 80.0–100.0)
MONO ABS: 7.5 10*3/uL — AB (ref 0.1–1.0)
Monocytes Relative: 37 %
Neutro Abs: 8.7 10*3/uL — ABNORMAL HIGH (ref 1.7–7.7)
Neutrophils Relative %: 42 %
Platelets: 30 10*3/uL — ABNORMAL LOW (ref 150–400)
RBC: 2.54 MIL/uL — ABNORMAL LOW (ref 4.22–5.81)
RDW: 21 % — ABNORMAL HIGH (ref 11.5–15.5)
WBC Morphology: ABNORMAL
WBC: 20.4 10*3/uL — ABNORMAL HIGH (ref 4.0–10.5)
nRBC: 0.7 % — ABNORMAL HIGH (ref 0.0–0.2)

## 2018-04-10 LAB — BASIC METABOLIC PANEL
Anion gap: 3 — ABNORMAL LOW (ref 5–15)
BUN: 28 mg/dL — AB (ref 8–23)
CO2: 24 mmol/L (ref 22–32)
Calcium: 8.1 mg/dL — ABNORMAL LOW (ref 8.9–10.3)
Chloride: 118 mmol/L — ABNORMAL HIGH (ref 98–111)
Creatinine, Ser: 0.98 mg/dL (ref 0.61–1.24)
GFR calc Af Amer: >60 mL/min (ref >60)
GFR calc non Af Amer: >60 mL/min (ref >60)
Glucose, Bld: 137 mg/dL — ABNORMAL HIGH (ref 70–99)
Potassium: 3.6 mmol/L (ref 3.5–5.1)
Sodium: 145 mmol/L (ref 135–145)

## 2018-04-10 LAB — CRYPTOCOCCAL ANTIGEN: Crypto Ag: NEGATIVE

## 2018-04-10 MED ORDER — SODIUM CHLORIDE 0.9% IV SOLUTION
Freq: Once | INTRAVENOUS | Status: AC
  Administered 2018-04-10: 18:00:00 via INTRAVENOUS

## 2018-04-10 MED ORDER — SODIUM CHLORIDE 0.9% IV SOLUTION: Freq: Once | INTRAVENOUS | Status: DC

## 2018-04-10 NOTE — Progress Notes (Signed)
PROGRESS NOTE    Omar [place]  WCH:[id number] DOB: [birth year] DOA: 04/08/2018 PCP: [name], MD    Brief Narrative:  77 year old male with a history of myelodysplastic syndrome, cognitive deficits who is currently at a skilled nursing facility, brought to the hospital with change in mental status over 2 to 3 days prior to admission.  Patient was increasingly lethargic and not responding.  He was found to have leukocytosis of 25,000.  Urinalysis indicated possible infection.  He is being treated with intravenous antibiotics and IV fluids.  He was also clinically dehydrated.  Overall mental status has failed to improve.  Antibiotics have been broadened to cover CNS infections.  Lumbar puncture will be challenging in the setting of thrombocytopenia and altered mental status.   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 Sepsis.  Unclear etiology.  Treating for possible UTI.  Other etiologies include possible CNS infection.  Currently on broad spectrum intravenous antibiotics.  He is also receiving IV fluid.  Blood cultures have shown no growth.  Continues to have a high WBC count. 2. Acute toxic encephalopathy superimposed on baseline dementia.  Patient is normally able to converse, although he does remain confused.  Currently, he is lethargic, somnolent and does not follow commands or answer questions.  He does have periodic jerking movements, which are asymmetrical.  This is currently not his baseline.  Imaging including MRI brain did not show any new findings.  ABG also unrevealing.  He recently had RPR checked which was found to be negative.  Other etiologies to consider would be CNS  infection.  His antibiotics have been broadened to include vancomycin, ampicillin, acyclovir.  Continue ceftriaxone.  Discussed with Dr. Thornton Papas, can consider attempting lumbar puncture tomorrow. 3. Hypernatremia.  Related to dehydration/IV saline.  Change fluids to hypotonic fluids 4. Thrombocytopenia.  Related to myelodysplastic syndrome. Will transfuse 1 unit of platelets today.  We will keep another unit of platelets to be transfused tomorrow prior to procedure if needed 5. Anemia.  Related to myelodysplastic syndrome and some degree of hemodilution.  Patient was transfused 1 unit of PRBC. 6. Dehydration.  Treated with IV fluids. 7. Possible UTI.  On broad-spectrum antibiotics.  Urine culture positive for enterococcus 8. Ventriculomegaly.  Noted on imaging.  Plans are for outpatient follow-up with neurology. 9. Acute kidney injury.  Related to dehydration.  Resolved with IV fluids. 10. Hypothyroidism.  Chronically on Synthroid.  This is on hold while he is acutely ill since he is unable to take it by mouth.   DVT prophylaxis: SCDs Code Status: DNR Family Communication: Discussed with [name] who the patient had previously assigned to assist in his care and help make decisions for him. Disposition Plan: Return to nursing facility on discharge once mental status is better.   Consultants:     Procedures:     Antimicrobials:   Ceftriaxone 12/23 >  Vancomycin 12/24>  Ampicillin 12/24>  Acyclovir 12/24>   Subjective: Patient remains somnolent, does not answer questions or follow commands  Objective: Vitals:   04/09/18 1455 04/09/18 1736 04/09/18 2222 04/10/18 0518  BP: 118/67 131/74 (!) 142/67 (!) 146/77  Pulse: 93  89 85 78  Resp: 20 20 20 18   Temp: 98.3 F (36.8 C) 98.3 F (36.8 C) 98.6 F (37 C) 98.4 F (36.9 C)  TempSrc: Axillary Axillary Oral Oral  SpO2: 100% 96% 100% 100%  Weight:      Height:        Intake/Output Summary (Last 24 hours) at 04/10/2018  1457 Last data filed at 04/10/2018 0800 Gross per 24 hour  Intake 3293.56 ml  Output 701 ml  Net 2592.56 ml   Filed Weights   04/08/18 1518 04/08/18 1829  Weight: 73.5 kg 65.8 kg    Examination:  General exam: Somnolent, does not respond to voice Respiratory system: Clear to auscultation. Respiratory effort normal. Cardiovascular system:RRR. No murmurs, rubs, gallops. Gastrointestinal system: Abdomen is nondistended, soft and nontender. No organomegaly or masses felt. Normal bowel sounds heard. Central nervous system:No focal neurological deficits. Extremities: No C/C/E, +pedal pulses Skin: No rashes, lesions or ulcers Psychiatry: Remains lethargic   Data Reviewed: I have personally reviewed following labs and imaging studies  CBC: Recent Labs  Lab 04/08/18 1515 04/08/18 1522 04/09/18 0550 04/10/18 0538  WBC 25.1*  --  22.0* 20.4*  NEUTROABS 10.1*  --  10.4* 8.7*  HGB 8.8* 8.8* 7.3* 8.2*  HCT 28.6* 26.0* 24.1* 26.4*  MCV 107.5*  --  111.6* 103.9*  PLT 59*  --  44* 30*   Basic Metabolic Panel: Recent Labs  Lab 04/08/18 1515 04/08/18 1522 04/09/18 0550 04/10/18 0538  NA 144 147* 149* 145  K 4.4 4.4 3.7 3.6  CL 114* 112* 121* 118*  CO2 23  --  23 24  GLUCOSE 134* 133* 124* 137*  BUN 51* 42* 38* 28*  CREATININE 1.35* 1.30* 1.18 0.98  CALCIUM 9.0  --  8.3* 8.1*  MG  --   --  2.5*  --    GFR: Estimated Creatinine Clearance: 57.8 mL/min (by C-G formula based on SCr of 0.98 mg/dL). Liver Function Tests: Recent Labs  Lab 04/08/18 1515  AST 32  ALT 33  ALKPHOS 60  BILITOT 0.6  PROT 7.0  ALBUMIN 3.6   No results for input(s): LIPASE, AMYLASE in the last 168 hours. Recent Labs  Lab 04/09/18 1157  AMMONIA 22   Coagulation Profile: No results for input(s): INR, PROTIME in the last 168 hours. Cardiac Enzymes: No results for input(s): CKTOTAL, CKMB, CKMBINDEX, TROPONINI in the last 168 hours. BNP (last 3 results) No results for input(s): PROBNP in the  last 8760 hours. HbA1C: No results for input(s): HGBA1C in the last 72 hours. CBG: No results for input(s): GLUCAP in the last 168 hours. Lipid Profile: No results for input(s): CHOL, HDL, LDLCALC, TRIG, CHOLHDL, LDLDIRECT in the last 72 hours. Thyroid Function Tests: No results for input(s): TSH, T4TOTAL, FREET4, T3FREE, THYROIDAB in the last 72 hours. Anemia Panel: No results for input(s): VITAMINB12, FOLATE, FERRITIN, TIBC, IRON, RETICCTPCT in the last 72 hours. Sepsis Labs: Recent Labs  Lab 04/08/18 1522  LATICACIDVEN 1.18    Recent Results (from the past 240 hour(s))  Blood Culture (routine x 2)     Status: None (Preliminary result)   Collection Time: 04/08/18  3:15 PM  Result Value Ref Range Status   Specimen Description BLOOD LEFT HAND  Final   Special Requests   Final    BOTTLES DRAWN AEROBIC AND ANAEROBIC Blood Culture adequate volume   Culture   Final    NO GROWTH 2 DAYS Performed at [facility], [address], [place],  [place] [address]    Report Status PENDING  Incomplete  Blood Culture (routine x 2)     Status: None (Preliminary result)   Collection Time: 04/08/18  3:20 PM  Result Value Ref Range Status   Specimen Description BLOOD RIGHT HAND  Final   Special Requests   Final    BOTTLES DRAWN AEROBIC AND ANAEROBIC Blood Culture adequate volume   Culture   Final    NO GROWTH 2 DAYS Performed at [facility], [address], [place], [place] [address]    Report Status PENDING  Incomplete  Urine Culture     Status: Abnormal (Preliminary result)   Collection Time: 04/08/18  5:05 PM  Result Value Ref Range Status   Specimen Description   Final    URINE, RANDOM Performed at [facility], [address] [place], [place] [address]    Special Requests   Final    NONE Performed at [facility], [address], [place], [place] [address]    Culture >=100,000 COLONIES/mL ENTEROCOCCUS FAECALIS (A)  Final   Report Status PENDING  Incomplete         Radiology  Studies: Mr Virgel Paling UX Contrast  Result Date: 04/09/2018 CLINICAL DATA:  Altered mental status. Patient does not communicate. EXAM: MRI HEAD WITHOUT CONTRAST MRA HEAD WITHOUT CONTRAST TECHNIQUE: Multiplanar, multiecho pulse sequences of the brain and surrounding structures were obtained without intravenous contrast. Angiographic images of the head were obtained using MRA technique without contrast. COMPARISON:  CT head 03/21/2018. FINDINGS: Significant motion degradation. Study is of marginal diagnostic utility. MRI HEAD FINDINGS Brain: No visible restricted diffusion. Advanced ventriculomegaly affecting the lateral, and third ventricles, greater than fourth ventricle, possible aqueductal stenosis versus normal pressure hydrocephalus. Periventricular T2 and FLAIR hyperintensities suggestive of transependymal absorption. Superimposed cerebral and cerebellar atrophy. No intra-axial mass lesion or extra-axial fluid. Vascular: Reported separately. Skull and upper cervical spine: Unable to assess. Sinuses/Orbits: No visible layering fluid.  No orbital masses. Other: None MRA HEAD FINDINGS There is patency of the internal carotid arteries, and basilar artery. The vertebrals are patent and codominant. There is no intracranial large vessel occlusion affecting the anterior or middle cerebral arteries. Gross patency of the posterior cerebral arteries is established. Unable to assess for possible stenosis. IMPRESSION: Motion degraded exam of marginal diagnostic utility. No visible acute stroke or mass lesion. Advanced ventriculomegaly, likely aqueductal stenosis versus normal pressure hydrocephalus. Major intracranial vessels are patent. Electronically Signed   By: Staci Righter M.D.   On: 04/09/2018 12:52   Mr Brain Wo Contrast  Result Date: 04/09/2018 CLINICAL DATA:  Altered mental status. Patient does not communicate. EXAM: MRI HEAD WITHOUT CONTRAST MRA HEAD WITHOUT CONTRAST TECHNIQUE: Multiplanar, multiecho pulse  sequences of the brain and surrounding structures were obtained without intravenous contrast. Angiographic images of the head were obtained using MRA technique without contrast. COMPARISON:  CT head 03/21/2018. FINDINGS: Significant motion degradation. Study is of marginal diagnostic utility. MRI HEAD FINDINGS Brain: No visible restricted diffusion. Advanced ventriculomegaly affecting the lateral, and third ventricles, greater than fourth ventricle, possible aqueductal stenosis versus normal pressure hydrocephalus. Periventricular T2 and FLAIR hyperintensities suggestive of transependymal absorption. Superimposed cerebral and cerebellar atrophy. No intra-axial mass lesion or extra-axial fluid. Vascular: Reported separately. Skull and upper cervical spine: Unable to assess. Sinuses/Orbits: No visible layering fluid.  No orbital masses. Other: None MRA HEAD FINDINGS There is patency of the internal carotid arteries, and basilar artery. The vertebrals are patent and codominant. There is no intracranial large vessel  occlusion affecting the anterior or middle cerebral arteries. Gross patency of the posterior cerebral arteries is established. Unable to assess for possible stenosis. IMPRESSION: Motion degraded exam of marginal diagnostic utility. No visible acute stroke or mass lesion. Advanced ventriculomegaly, likely aqueductal stenosis versus normal pressure hydrocephalus. Major intracranial vessels are patent. Electronically Signed   By: Staci Righter M.D.   On: 04/09/2018 12:52   Portable Chest 1 View  Result Date: 04/09/2018 CLINICAL DATA:  Acute onset of sepsis and fever. Altered mental status. EXAM: PORTABLE CHEST 1 VIEW COMPARISON:  Chest radiograph performed 04/08/2018 FINDINGS: The lungs are well-aerated. Pulmonary vascularity is at the upper limits of normal. There is no evidence of focal opacification, pleural effusion or pneumothorax. The cardiomediastinal silhouette is within normal limits. A left-sided  chest port is noted ending about the distal SVC. No acute osseous abnormalities are seen. IMPRESSION: No acute cardiopulmonary process seen. Electronically Signed   By: Garald Balding M.D.   On: 04/09/2018 05:53   Dg Chest Portable 1 View  Result Date: 04/08/2018 CLINICAL DATA:  Patient unresponsive today. EXAM: PORTABLE CHEST 1 VIEW COMPARISON:  Single-view of the chest 03/19/2018. PA and lateral chest 11/20/2012. FINDINGS: Port-A-Cath is in place. Lung volumes are somewhat low but the lungs are clear. Heart size is normal. Aortic atherosclerosis is noted. No pneumothorax or pleural fluid. IMPRESSION: No acute disease. Atherosclerosis. Electronically Signed   By: Inge Rise M.D.   On: 04/08/2018 15:40        Scheduled Meds: . sodium chloride   Intravenous Once  . allopurinol  300 mg Oral Daily  . feeding supplement (ENSURE ENLIVE)  237 mL Oral BID BM  . levothyroxine  150 mcg Oral Q0600  . pantoprazole  40 mg Oral Daily   Continuous Infusions: . acyclovir 660 mg (04/10/18 1221)  . ampicillin-sulbactam (UNASYN) IV 3 g (04/10/18 1214)  . cefTRIAXone (ROCEPHIN)  IV 2 g (04/10/18 1316)  . dextrose 5 % with KCl 20 mEq / L 20 mEq (04/10/18 0002)  . vancomycin       LOS: 1 day    Time spent: 39mins    Kathie Dike, MD [facility] Pager [phone]  If 7PM-7AM, please contact night-coverage [email or web address] Password [facility] 04/10/2018, 2:57 PM

## 2018-04-11 ENCOUNTER — Inpatient Hospital Stay (HOSPITAL_COMMUNITY): Payer: Medicare HMO

## 2018-04-11 LAB — BPAM PLATELET PHERESIS
BLOOD PRODUCT EXPIRATION DATE: 201912252359
Blood Product Expiration Date: 201912252359
ISSUE DATE / TIME: 201912251814
ISSUE DATE / TIME: 201912252124
UNIT TYPE AND RH: 6200
Unit Type and Rh: 8400

## 2018-04-11 LAB — BASIC METABOLIC PANEL
Anion gap: 7 (ref 5–15)
BUN: 22 mg/dL (ref 8–23)
CO2: 21 mmol/L — ABNORMAL LOW (ref 22–32)
Calcium: 8.3 mg/dL — ABNORMAL LOW (ref 8.9–10.3)
Chloride: 114 mmol/L — ABNORMAL HIGH (ref 98–111)
Creatinine, Ser: 0.82 mg/dL (ref 0.61–1.24)
GFR calc Af Amer: >60 mL/min (ref >60)
GFR calc non Af Amer: >60 mL/min (ref >60)
GLUCOSE: 102 mg/dL — AB (ref 70–99)
Potassium: 3.8 mmol/L (ref 3.5–5.1)
Sodium: 142 mmol/L (ref 135–145)

## 2018-04-11 LAB — CBC WITH DIFFERENTIAL/PLATELET
Abs Immature Granulocytes: 1.49 10*3/uL — ABNORMAL HIGH (ref 0.00–0.07)
Basophils Absolute: 0.1 10*3/uL (ref 0.0–0.1)
Basophils Relative: 0 %
Eosinophils Absolute: 0.1 10*3/uL (ref 0.0–0.5)
Eosinophils Relative: 0 %
HCT: 24.6 % — ABNORMAL LOW (ref 39.0–52.0)
Hemoglobin: 7.8 g/dL — ABNORMAL LOW (ref 13.0–17.0)
IMMATURE GRANULOCYTES: 6 %
Lymphocytes Relative: 11 %
Lymphs Abs: 2.7 10*3/uL (ref 0.7–4.0)
MCH: 33.5 pg (ref 26.0–34.0)
MCHC: 31.7 g/dL (ref 30.0–36.0)
MCV: 105.6 fL — ABNORMAL HIGH (ref 80.0–100.0)
MONOS PCT: 44 %
Monocytes Absolute: 10.5 10*3/uL — ABNORMAL HIGH (ref 0.1–1.0)
NEUTROS ABS: 9.3 10*3/uL — AB (ref 1.7–7.7)
Neutrophils Relative %: 39 %
Platelets: 61 10*3/uL — ABNORMAL LOW (ref 150–400)
RBC: 2.33 MIL/uL — ABNORMAL LOW (ref 4.22–5.81)
RDW: 20.1 % — ABNORMAL HIGH (ref 11.5–15.5)
WBC: 24.1 10*3/uL — ABNORMAL HIGH (ref 4.0–10.5)
nRBC: 0.7 % — ABNORMAL HIGH (ref 0.0–0.2)

## 2018-04-11 LAB — PREPARE PLATELET PHERESIS
Unit division: 0
Unit division: 0

## 2018-04-11 LAB — GLUCOSE, CAPILLARY: Glucose-Capillary: 123 mg/dL — ABNORMAL HIGH (ref 70–99)

## 2018-04-11 LAB — CSF CELL COUNT WITH DIFFERENTIAL
RBC Count, CSF: 7 /mm3 — ABNORMAL HIGH
Tube #: 4
WBC CSF: 976 /mm3 — AB (ref 0–5)

## 2018-04-11 LAB — PROTEIN AND GLUCOSE, CSF
Glucose, CSF: 36 mg/dL — ABNORMAL LOW (ref 40–70)
Total  Protein, CSF: >600 mg/dL — ABNORMAL HIGH (ref 15–45)

## 2018-04-11 LAB — CRYPTOCOCCAL ANTIGEN, CSF: CRYPTO AG: NEGATIVE

## 2018-04-11 MED ORDER — GLYCOPYRROLATE 0.2 MG/ML IJ SOLN: 0.2000 mg | INTRAMUSCULAR | Status: DC | PRN

## 2018-04-11 MED ORDER — ONDANSETRON HCL 4 MG/2ML IJ SOLN: 4.0000 mg | Freq: Four times a day (QID) | INTRAMUSCULAR | Status: DC | PRN

## 2018-04-11 MED ORDER — HALOPERIDOL LACTATE 2 MG/ML PO CONC
0.5000 mg | ORAL | Status: DC | PRN
  Filled 2018-04-11: qty 0.3

## 2018-04-11 MED ORDER — DEXTROSE 5 % IV SOLN
10.0000 mg/kg | Freq: Three times a day (TID) | INTRAVENOUS | Status: DC
  Administered 2018-04-11: 660 mg via INTRAVENOUS
  Filled 2018-04-11 (×4): qty 13.2

## 2018-04-11 MED ORDER — MORPHINE SULFATE (PF) 2 MG/ML IV SOLN
2.0000 mg | INTRAVENOUS | Status: DC | PRN
  Administered 2018-04-12 (×2): 2 mg via INTRAVENOUS
  Filled 2018-04-11 (×2): qty 1

## 2018-04-11 MED ORDER — BIOTENE DRY MOUTH MT LIQD: 15.0000 mL | OROMUCOSAL | Status: DC | PRN

## 2018-04-11 MED ORDER — ACETAMINOPHEN 650 MG RE SUPP: 650.0000 mg | Freq: Four times a day (QID) | RECTAL | Status: DC | PRN

## 2018-04-11 MED ORDER — GLYCOPYRROLATE 1 MG PO TABS: 1.0000 mg | ORAL_TABLET | ORAL | Status: DC | PRN

## 2018-04-11 MED ORDER — HALOPERIDOL LACTATE 5 MG/ML IJ SOLN: 0.5000 mg | INTRAMUSCULAR | Status: DC | PRN

## 2018-04-11 MED ORDER — HALOPERIDOL 0.5 MG PO TABS: 0.5000 mg | ORAL_TABLET | ORAL | Status: DC | PRN

## 2018-04-11 MED ORDER — ONDANSETRON 4 MG PO TBDP: 4.0000 mg | ORAL_TABLET | Freq: Four times a day (QID) | ORAL | Status: DC | PRN

## 2018-04-11 MED ORDER — ACETAMINOPHEN 325 MG PO TABS: 650.0000 mg | ORAL_TABLET | Freq: Four times a day (QID) | ORAL | Status: DC | PRN

## 2018-04-11 MED ORDER — POLYVINYL ALCOHOL 1.4 % OP SOLN: 1.0000 [drp] | Freq: Four times a day (QID) | OPHTHALMIC | Status: DC | PRN

## 2018-04-11 NOTE — Progress Notes (Signed)
PROGRESS NOTE    Omar [place]  SJG:[id number] DOB: February 03, 1941 DOA: 04/08/2018 PCP: [name], MD    Brief Narrative:  77 year old male with a history of myelodysplastic syndrome, cognitive deficits who is currently at a skilled nursing facility, brought to the hospital with change in mental status over 2 to 3 days prior to admission.  Patient was increasingly lethargic and not responding.  He was found to have leukocytosis of 25,000.  Urinalysis indicated possible infection.  He is being treated with intravenous antibiotics and IV fluids.  He was also clinically dehydrated.  Overall mental status has failed to improve.  Antibiotics have been broadened to cover CNS infections.  Lumbar puncture will be challenging in the setting of thrombocytopenia and altered mental status.   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 Sepsis.  He has been treated for UTI. Blood cultures have shown no growth.  Continues to have a high WBC count. 2. Acute toxic encephalopathy superimposed on baseline dementia.  Patient is normally able to converse, although he does remain confused.  Currently, he is lethargic, somnolent and does not follow commands or answer questions.  He does have periodic jerking movements, which are asymmetrical.  This is currently not his baseline.  Imaging including MRI brain did not show any new findings.  ABG also unrevealing.  He recently had RPR checked which was found to be negative.  Other etiologies to consider would be CNS infection.  His antibiotics were broadened to include vancomycin, ampicillin, acyclovir and ceftriaxone.  He underwent lumbar puncture showed greater  than 900 WBCs in CSF and greater than 600 protein.  Case was reviewed with Dr. Linus Salmons of infectious disease, and it was felt unlikely that these findings would be related to an infectious cause considering the clinical picture.  Case was then reviewed with Dr. Delton Coombes, patient's oncologist.  Dr. 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  Patient is not a candidate for any further treatments.  Recommendations from oncology were palliative care/hospice and comfort measures.  Further antibiotics will be discontinued. 3. Hypernatremia.  Improved with hypotonic fluids 4. Thrombocytopenia.  Related to myelodysplastic syndrome.  He was transfused platelets in order to safely perform lumbar puncture 5. Anemia.  Related to myelodysplastic syndrome and some degree of hemodilution.  Patient was transfused 1 unit of PRBC. 6. Dehydration.  Treated with IV fluids. 7. Possible UTI.  Urine culture positive for E. coli.  He has been treated with antibiotics. 8. Acute kidney injury.  Related to dehydration.  Resolved with IV fluids. 9. Hypothyroidism.  Chronically on Synthroid.  This is on hold while he is acutely ill since he is unable to take it by mouth. 10. Goals of care.  Findings of lumbar puncture and discussion with oncology reviewed with [name] with recommendations for hospice and palliative care.  [name] does agree with this.  She confirmed that patient does not have any family that actively involved in his life.  Review of prior notes would confirmed this.  Patient will likely benefit from transition to residential hospice.  DVT prophylaxis: SCDs Code Status: DNR, comfort measures Family Communication: Discussed with [name] who the patient had previously assigned to assist in his care and help make decisions for him. Disposition Plan: Return to nursing facility on discharge  once mental status is better.   Consultants:     Procedures:     Antimicrobials:   Ceftriaxone 12/23 > 12/26  Vancomycin 12/24> 12/26  Ampicillin 12/24> 12/26  Acyclovir 12/24> 12/26   Subjective: Remains lethargic, does not answer questions or follow commands  Objective: Vitals:   04/11/18 0545 04/11/18 0815 04/11/18 1339 04/11/18 1600  BP: (!) 164/80 140/74 (!) 142/78   Pulse: 92 96 98   Resp: 18  (!) 21   Temp: 99.4 F (37.4 C) 99 F (37.2 C) 99.1 F (37.3 C) (!) 101.5 F (38.6 C)  TempSrc: Oral Oral Oral Rectal  SpO2: 96% 98% 99%   Weight:      Height:        Intake/Output Summary (Last 24 hours) at 04/11/2018 2031 Last data filed at 04/11/2018 1800 Gross per 24 hour  Intake 2352.96 ml  Output 1425 ml  Net 927.96 ml   Filed Weights   04/08/18 1518 04/08/18 1829  Weight: 73.5 kg 65.8 kg    Examination:  General exam: Somnolent, opens eyes to voice Respiratory system: Clear to auscultation.  Increased respiratory effort Cardiovascular system:RRR. No murmurs, rubs, gallops. Gastrointestinal system: Abdomen is nondistended, soft and nontender. No organomegaly or masses felt. Normal bowel sounds heard. Central nervous system:No focal neurological deficits. Extremities: No C/C/E, +pedal pulses Skin: No rashes, lesions or ulcers Psychiatry: Lethargic    Data Reviewed: I have personally reviewed following labs and imaging studies  CBC: Recent Labs  Lab 04/08/18 1515 04/08/18 1522 04/09/18 0550 04/10/18 0538 04/11/18 0457  WBC 25.1*  --  22.0* 20.4* 24.1*  NEUTROABS 10.1*  --  10.4* 8.7* 9.3*  HGB 8.8* 8.8* 7.3* 8.2* 7.8*  HCT 28.6* 26.0* 24.1* 26.4* 24.6*  MCV 107.5*  --  111.6* 103.9* 105.6*  PLT 59*  --  44* 30* 61*   Basic Metabolic Panel: Recent Labs  Lab 04/08/18 1515 04/08/18 1522 04/09/18 0550 04/10/18 0538 04/11/18 0457  NA 144 147* 149* 145 142  K 4.4 4.4 3.7 3.6 3.8  CL 114* 112* 121* 118* 114*  CO2 23  --  23 24 21*   GLUCOSE 134* 133* 124* 137* 102*  BUN 51* 42* 38* 28* 22  CREATININE 1.35* 1.30* 1.18 0.98 0.82  CALCIUM 9.0  --  8.3* 8.1* 8.3*  MG  --   --  2.5*  --   --    GFR: Estimated Creatinine Clearance: 69.1 mL/min (by C-G formula based on SCr of 0.82 mg/dL). Liver Function Tests: Recent Labs  Lab 04/08/18 1515  AST 32  ALT 33  ALKPHOS 60  BILITOT 0.6  PROT 7.0  ALBUMIN 3.6   No results for input(s): LIPASE, AMYLASE in the last 168 hours. Recent Labs  Lab 04/09/18 1157  AMMONIA 22   Coagulation Profile: No results for input(s): INR, PROTIME in the last 168 hours. Cardiac Enzymes: No results for input(s): CKTOTAL, CKMB, CKMBINDEX, TROPONINI in the last 168 hours. BNP (last 3 results) No results for input(s): PROBNP in the last 8760 hours. HbA1C: No results for input(s): HGBA1C in the last 72 hours. CBG: Recent Labs  Lab 04/08/18 1510  GLUCAP 123*   Lipid Profile: No results for input(s): CHOL, HDL, LDLCALC, TRIG, CHOLHDL, LDLDIRECT in the  last 72 hours. Thyroid Function Tests: No results for input(s): TSH, T4TOTAL, FREET4, T3FREE, THYROIDAB in the last 72 hours. Anemia Panel: No results for input(s): VITAMINB12, FOLATE, FERRITIN, TIBC, IRON, RETICCTPCT in the last 72 hours. Sepsis Labs: Recent Labs  Lab 04/08/18 1522  LATICACIDVEN 1.18    Recent Results (from the past 240 hour(s))  Blood Culture (routine x 2)     Status: None (Preliminary result)   Collection Time: 04/08/18  3:15 PM  Result Value Ref Range Status   Specimen Description BLOOD LEFT HAND  Final   Special Requests   Final    BOTTLES DRAWN AEROBIC AND ANAEROBIC Blood Culture adequate volume   Culture   Final    NO GROWTH 3 DAYS Performed at [facility], [address] [place], [place] [address]    Report Status PENDING  Incomplete  Blood Culture (routine x 2)     Status: None (Preliminary result)   Collection Time: 04/08/18  3:20 PM  Result Value Ref Range Status   Specimen Description BLOOD  RIGHT HAND  Final   Special Requests   Final    BOTTLES DRAWN AEROBIC AND ANAEROBIC Blood Culture adequate volume   Culture   Final    NO GROWTH 3 DAYS Performed at [facility], [address] [place], [place] [address]    Report Status PENDING  Incomplete  Urine Culture     Status: Abnormal (Preliminary result)   Collection Time: 04/08/18  5:05 PM  Result Value Ref Range Status   Specimen Description   Final    URINE, RANDOM Performed at [facility], [address], [place], [place] [address]    Special Requests   Final    NONE Performed at [facility], [address] [place], [place] [address]    Culture (A)  Final    >=100,000 COLONIES/mL ENTEROCOCCUS FAECALIS REPEATING SUSCEPTIBILITIES Performed at [facility], [place] [address], [place], [place] [address]    Report Status PENDING  Incomplete  CSF culture     Status: None (Preliminary result)   Collection Time: 04/11/18 12:00 PM  Result Value Ref Range Status   Specimen Description CSF  Final   Special Requests NONE  Final   Gram Stain   Final    WBC PRESENT, PREDOMINANTLY MONONUCLEAR NO ORGANISMS SEEN CYTOSPIN SMEAR Performed at [facility], [address], [place], [place] [address]    Culture PENDING  Incomplete   Report Status PENDING  Incomplete         Radiology Studies: Dg Fluoro Guide Lumbar Puncture  Result Date: 04/11/2018 CLINICAL DATA:  Altered mental status, leukocytosis, fever, question meningitis or encephalitis EXAM: DIAGNOSTIC LUMBAR PUNCTURE UNDER FLUOROSCOPIC GUIDANCE FLUOROSCOPY TIME:  Fluoroscopy Time:  0 minutes 6 seconds Radiation Exposure Index (if provided by the fluoroscopic device): 1.1 mGy Number of Acquired Spot Images: 1 PROCEDURE: Patient unable to provide consent for procedure due to altered mental status Note of medical necessity for procedure placed on chart by Dr. Roderic Palau. Patient placed prone. L4-L5 disc space was localized under fluoroscopy. Skin prepped and draped in usual  sterile fashion. Skin and soft tissues anesthetized with 1.5 mL of 1% lidocaine. 22 gauge needle was advanced into the spinal canal where clear yellow colored CSF was encountered with an opening pressure of 5 cm H2O (prone). 10 mL of CSF was obtained in 4 tubes for requested analysis. Procedure tolerated very well by patient without immediate complication. IMPRESSION: Fluoroscopic guided lumbar puncture as above. Electronically Signed  By: Lavonia Dana M.D.   On: 04/11/2018 12:52        Scheduled Meds: . sodium chloride   Intravenous Once   Continuous Infusions:    LOS: 2 days    Time spent: 81mins    Kathie Dike, MD [facility] Pager [phone]  If 7PM-7AM, please contact night-coverage [email or web address] Password [facility] 04/11/2018, 8:31 PM

## 2018-04-11 NOTE — Progress Notes (Signed)
Patient received 2 units of platelets in 12/25.  Tolerated well.

## 2018-04-11 NOTE — Progress Notes (Signed)
Rectal temp 101.5, Rectal suppository given. Dr. Roderic Palau notified

## 2018-04-11 NOTE — Clinical Social Work Note (Signed)
LCSW following. Pt reviewed in [facility] again today. Plan is for LP today. DC needs and timeframe not yet known. Pt admitted from [facility] setting. Will follow.

## 2018-04-11 NOTE — Procedures (Signed)
Preprocedure Dx: AMS, leukocytosis, fever Postprocedure Dx: AMS, leukocytosis, fever Procedure:  Fluoroscopically guided lumbar puncture Radiologist:  Thornton Papas Anesthesia:  1.5 ml of 1% lidocaine Specimen:  10 ml CSF, clear yellow colored EBL:   < 1 ml Opening pressure: 5 cm E5V (prone) Complications: None

## 2018-04-11 NOTE — Progress Notes (Signed)
Patient was taken down to radiology for lumbar puncture.  Staff was unable to reach Ms. Omar Lee regarding consent.  Based on my prior conversation with Ms. Omar Lee regarding need for this procedure and her wishes to pursue lumbar puncture at that time, I think it is reasonable to move forward with procedure.  At this time, patient remains lethargic with elevated WBC count.  Lumbar puncture at this time will be medically necessary.  [employer]

## 2018-04-12 ENCOUNTER — Inpatient Hospital Stay (HOSPITAL_COMMUNITY)
Admission: EM | Admit: 2018-04-12 | Discharge: 2018-04-17 | DRG: 836 | Disposition: E | Source: Ambulatory Visit | Attending: Internal Medicine | Admitting: Internal Medicine

## 2018-04-12 ENCOUNTER — Encounter (HOSPITAL_COMMUNITY): Payer: Medicare HMO

## 2018-04-12 DIAGNOSIS — Z79891 Long term (current) use of opiate analgesic: Secondary | ICD-10-CM

## 2018-04-12 DIAGNOSIS — D696 Thrombocytopenia, unspecified: Secondary | ICD-10-CM

## 2018-04-12 DIAGNOSIS — C7932 Secondary malignant neoplasm of cerebral meninges: Secondary | ICD-10-CM | POA: Diagnosis present

## 2018-04-12 DIAGNOSIS — C9590 Leukemia, unspecified not having achieved remission: Secondary | ICD-10-CM

## 2018-04-12 DIAGNOSIS — G038 Meningitis due to other specified causes: Secondary | ICD-10-CM

## 2018-04-12 DIAGNOSIS — C9200 Acute myeloblastic leukemia, not having achieved remission: Principal | ICD-10-CM | POA: Diagnosis present

## 2018-04-12 DIAGNOSIS — Z515 Encounter for palliative care: Secondary | ICD-10-CM | POA: Diagnosis present

## 2018-04-12 DIAGNOSIS — N3000 Acute cystitis without hematuria: Secondary | ICD-10-CM

## 2018-04-12 DIAGNOSIS — E039 Hypothyroidism, unspecified: Secondary | ICD-10-CM | POA: Diagnosis present

## 2018-04-12 DIAGNOSIS — F0390 Unspecified dementia without behavioral disturbance: Secondary | ICD-10-CM | POA: Diagnosis present

## 2018-04-12 LAB — URINE CULTURE: Culture: >=100000 — AB

## 2018-04-12 LAB — CREATININE, SERUM
Creatinine, Ser: 0.74 mg/dL (ref 0.61–1.24)
GFR calc Af Amer: >60 mL/min (ref >60)
GFR calc non Af Amer: >60 mL/min (ref >60)

## 2018-04-12 MED ORDER — LORAZEPAM 2 MG/ML PO CONC
1.0000 mg | ORAL | Status: DC | PRN
  Filled 2018-04-12: qty 0.5

## 2018-04-12 MED ORDER — POLYVINYL ALCOHOL 1.4 % OP SOLN: 1.0000 [drp] | Freq: Four times a day (QID) | OPHTHALMIC | Status: DC | PRN

## 2018-04-12 MED ORDER — GLYCOPYRROLATE 0.2 MG/ML IJ SOLN: 0.2000 mg | INTRAMUSCULAR | Status: DC | PRN

## 2018-04-12 MED ORDER — ONDANSETRON HCL 4 MG/2ML IJ SOLN: 4.0000 mg | Freq: Four times a day (QID) | INTRAMUSCULAR | Status: DC | PRN

## 2018-04-12 MED ORDER — ACETAMINOPHEN 325 MG PO TABS: 650.0000 mg | ORAL_TABLET | Freq: Four times a day (QID) | ORAL | Status: DC | PRN

## 2018-04-12 MED ORDER — GLYCOPYRROLATE 1 MG PO TABS: 1.0000 mg | ORAL_TABLET | ORAL | Status: DC | PRN

## 2018-04-12 MED ORDER — BIOTENE DRY MOUTH MT LIQD: 15.0000 mL | OROMUCOSAL | Status: DC | PRN

## 2018-04-12 MED ORDER — HALOPERIDOL 0.5 MG PO TABS: 0.5000 mg | ORAL_TABLET | ORAL | Status: DC | PRN

## 2018-04-12 MED ORDER — ONDANSETRON 4 MG PO TBDP: 4.0000 mg | ORAL_TABLET | Freq: Four times a day (QID) | ORAL | Status: DC | PRN

## 2018-04-12 MED ORDER — MORPHINE 100MG IN NS 100ML (1MG/ML) PREMIX INFUSION
2.0000 mg/h | INTRAVENOUS | Status: DC
  Administered 2018-04-12: 2 mg/h via INTRAVENOUS
  Filled 2018-04-12: qty 100

## 2018-04-12 MED ORDER — HALOPERIDOL LACTATE 2 MG/ML PO CONC
0.5000 mg | ORAL | Status: DC | PRN
  Filled 2018-04-12: qty 0.3

## 2018-04-12 MED ORDER — LORAZEPAM 1 MG PO TABS: 1.0000 mg | ORAL_TABLET | ORAL | Status: DC | PRN

## 2018-04-12 MED ORDER — MORPHINE 100MG IN NS 100ML (1MG/ML) PREMIX INFUSION
3.0000 mg/h | INTRAVENOUS | Status: DC
  Filled 2018-04-12: qty 100

## 2018-04-12 MED ORDER — LORAZEPAM 2 MG/ML IJ SOLN: 1.0000 mg | INTRAMUSCULAR | Status: DC | PRN

## 2018-04-12 MED ORDER — HALOPERIDOL LACTATE 5 MG/ML IJ SOLN: 0.5000 mg | INTRAMUSCULAR | Status: DC | PRN

## 2018-04-12 MED ORDER — ACETAMINOPHEN 650 MG RE SUPP: 650.0000 mg | Freq: Four times a day (QID) | RECTAL | Status: DC | PRN

## 2018-04-12 MED ORDER — MORPHINE BOLUS VIA INFUSION
2.0000 mg | INTRAVENOUS | Status: DC | PRN
  Filled 2018-04-12: qty 2

## 2018-04-12 NOTE — Clinical Social Work Note (Signed)
LCSW following. Pt discussed in Progression. At this time, pt's decision maker, Laurine Blazer, is option for residential hospice referral. Referral started. There are no beds currently available at the residence. Pt can be GIP in the hospital and transition to the residence if he is medically stable when a bed becomes available. Awaiting return call from Hospice and will update MD when he can do the orders to convert pt to GIP status.

## 2018-04-12 NOTE — H&P (Signed)
History and Physical    Omar Lee JJK:[id number] DOB: February 18, 1941 DOA: 03/31/2018  PCP: [name], MD   I have personally briefly reviewed patient's old medical records in [place]  Chief Complaint: Admission for GIP status  HPI: Omar Lee is a 77 y.o. male with medical history significant of acute myoblastic leukemia, myelodysplastic syndrome.  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  Prognosis is poor.  Recommendations were for hospice.  He was seen by hospice and placed on GIP status.  He is currently awaiting a bed at residential hospice.  He is on morphine infusion for pain/respiratory distress.  Review of Systems: As per HPI otherwise 10 point review of systems negative.    Past Medical History:  Diagnosis Date  . Cancer ([place])    MDS  . Frequent falls   . Hyperlipidemia   . Hypothyroidism   . Memory loss   . Reflux   . Sleep apnea     Past Surgical History:  Procedure Laterality Date  . ESOPHAGOGASTRODUODENOSCOPY (EGD) WITH ESOPHAGEAL DILATION N/A 11/22/2012   Procedure: ESOPHAGOGASTRODUODENOSCOPY (EGD) WITH ESOPHAGEAL DILATION;  Surgeon: Rogene Houston, MD;  Location: [facility];  Service: Endoscopy;  Laterality: N/A;  1120  . HEMORRHOID SURGERY    . NASAL SEPTUM SURGERY    . PORTACATH PLACEMENT Left 11/02/2017   Procedure: INSERTION PORT WITH ATTACHED CATHETER LEFT SUBCLAVIAN;  Surgeon: Aviva Signs, MD;  Location: [facility] ORS;  Service: General;  Laterality: Left;  . SHOULDER SURGERY Right   . TONSILLECTOMY AND ADENOIDECTOMY      Social History:  reports that he has never smoked. He has never used smokeless tobacco. He reports that he does not drink alcohol or use drugs.  No Known Allergies  Family History  Problem Relation Age of Onset  . Hypertension Mother   . Heart attack Mother   . Heart attack Father   . Diabetes Brother      Prior to Admission  medications   Medication Sig Start Date End Date Taking? Authorizing Provider  allopurinol (ZYLOPRIM) 300 MG tablet Take 1 tablet (300 mg total) by mouth daily. 09/06/17   Derek Jack, MD  feeding supplement, ENSURE ENLIVE, (ENSURE ENLIVE) LIQD Take 237 mLs by mouth 2 (two) times daily between meals. 03/25/18   Orson Eva, MD  levothyroxine (SYNTHROID, LEVOTHROID) 150 MCG tablet Take 1 tablet (150 mcg total) by mouth daily. 03/25/18   Orson Eva, MD  pantoprazole (PROTONIX) 40 MG tablet  08/04/17   [provider]    Physical Exam: There were no vitals filed for this visit.  Constitutional: Lethargic, somnolent Eyes: PERRL, lids and conjunctivae normal ENMT: Mucous membranes are dry.  Neck: normal, supple, no masses, no thyromegaly Respiratory: clear to auscultation bilaterally, no wheezing, no crackles. Normal respiratory effort. No accessory muscle use.  Cardiovascular: Regular rate and rhythm, no murmurs / rubs / gallops. No extremity edema. 2+ pedal pulses. No carotid bruits.  Abdomen: no tenderness, no masses palpated. No hepatosplenomegaly. Bowel sounds positive.  Musculoskeletal: no clubbing / cyanosis. No joint deformity upper and lower extremities. Good ROM, no contractures. Normal muscle tone.  Skin: no rashes, lesions, ulcers. No induration Neurologic: Lethargic, unable to cooperate with exam Psychiatric: Lethargic   Labs on Admission: I have personally reviewed following labs and imaging studies  CBC: Recent Labs  Lab 04/08/18 1515 04/08/18 1522 04/09/18 0550 04/10/18 0538 04/11/18 0457  WBC  25.1*  --  22.0* 20.4* 24.1*  NEUTROABS 10.1*  --  10.4* 8.7* 9.3*  HGB 8.8* 8.8* 7.3* 8.2* 7.8*  HCT 28.6* 26.0* 24.1* 26.4* 24.6*  MCV 107.5*  --  111.6* 103.9* 105.6*  PLT 59*  --  44* 30* 61*   Basic Metabolic Panel: Recent Labs  Lab 04/08/18 1515 04/08/18 1522 04/09/18 0550 04/10/18 0538 04/11/18 0457 04/11/2018 0442  NA 144 147* 149* 145 142  --   K  4.4 4.4 3.7 3.6 3.8  --   CL 114* 112* 121* 118* 114*  --   CO2 23  --  23 24 21*  --   GLUCOSE 134* 133* 124* 137* 102*  --   BUN 51* 42* 38* 28* 22  --   CREATININE 1.35* 1.30* 1.18 0.98 0.82 0.74  CALCIUM 9.0  --  8.3* 8.1* 8.3*  --   MG  --   --  2.5*  --   --   --    GFR: Estimated Creatinine Clearance: 70.8 mL/min (by C-G formula based on SCr of 0.74 mg/dL). Liver Function Tests: Recent Labs  Lab 04/08/18 1515  AST 32  ALT 33  ALKPHOS 60  BILITOT 0.6  PROT 7.0  ALBUMIN 3.6   No results for input(s): LIPASE, AMYLASE in the last 168 hours. Recent Labs  Lab 04/09/18 1157  AMMONIA 22   Coagulation Profile: No results for input(s): INR, PROTIME in the last 168 hours. Cardiac Enzymes: No results for input(s): CKTOTAL, CKMB, CKMBINDEX, TROPONINI in the last 168 hours. BNP (last 3 results) No results for input(s): PROBNP in the last 8760 hours. HbA1C: No results for input(s): HGBA1C in the last 72 hours. CBG: Recent Labs  Lab 04/08/18 1510  GLUCAP 123*   Lipid Profile: No results for input(s): CHOL, HDL, LDLCALC, TRIG, CHOLHDL, LDLDIRECT in the last 72 hours. Thyroid Function Tests: No results for input(s): TSH, T4TOTAL, FREET4, T3FREE, THYROIDAB in the last 72 hours. Anemia Panel: No results for input(s): VITAMINB12, FOLATE, FERRITIN, TIBC, IRON, RETICCTPCT in the last 72 hours. Urine analysis:    Component Value Date/Time   COLORURINE YELLOW 04/08/2018 1515   APPEARANCEUR HAZY (A) 04/08/2018 1515   LABSPEC 1.013 04/08/2018 1515   PHURINE 7.0 04/08/2018 1515   GLUCOSEU NEGATIVE 04/08/2018 1515   HGBUR SMALL (A) 04/08/2018 1515   BILIRUBINUR NEGATIVE 04/08/2018 1515   KETONESUR NEGATIVE 04/08/2018 1515   PROTEINUR 100 (A) 04/08/2018 1515   NITRITE NEGATIVE 04/08/2018 1515   LEUKOCYTESUR MODERATE (A) 04/08/2018 1515    Radiological Exams on Admission: Dg Fluoro Guide Lumbar Puncture  Result Date: 04/11/2018 CLINICAL DATA:  Altered mental status,  leukocytosis, fever, question meningitis or encephalitis EXAM: DIAGNOSTIC LUMBAR PUNCTURE UNDER FLUOROSCOPIC GUIDANCE FLUOROSCOPY TIME:  Fluoroscopy Time:  0 minutes 6 seconds Radiation Exposure Index (if provided by the fluoroscopic device): 1.1 mGy Number of Acquired Spot Images: 1 PROCEDURE: Patient unable to provide consent for procedure due to altered mental status Note of medical necessity for procedure placed on chart by Dr. Roderic Palau. Patient placed prone. L4-L5 disc space was localized under fluoroscopy. Skin prepped and draped in usual sterile fashion. Skin and soft tissues anesthetized with 1.5 mL of 1% lidocaine. 22 gauge needle was advanced into the spinal canal where clear yellow colored CSF was encountered with an opening pressure of 5 cm H2O (prone). 10 mL of CSF was obtained in 4 tubes for requested analysis. Procedure tolerated very well by patient without immediate complication. IMPRESSION: Fluoroscopic guided lumbar puncture  as above. Electronically Signed   By: Lavonia Dana M.D.   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  Prognosis is terminal.  He is on morphine infusion for respiratory distress and pain.  Patient is lethargic and does not respond.  Awaiting bed at residential hospice for end-of-life care   Kathie Dike MD [facility] Pager [phone]  If 7PM-7AM, please contact night-coverage [email or web address] Password [facility]  04/02/2018, 6:22 PM

## 2018-04-12 NOTE — Progress Notes (Signed)
RR still around 22/min. Morphine increase to 3mg /hr at this time. Will continue to monitor.

## 2018-04-12 NOTE — Discharge Summary (Addendum)
Physician Discharge Summary  Omar Lee SJG:[id number] DOB: 04/07/1941 DOA: 04/08/2018  PCP: [name], MD  Admit date: 04/08/2018 Discharge date: 03/20/2018  Admitted From: Nursing facility Disposition: Awaiting bed at residential hospice  Recommendations for Outpatient Follow-up:  1. Patient is awaiting bed at residential hospice, currently GIP status  Discharge Condition: Terminal CODE STATUS: DNR, comfort measures Diet recommendation: Comfort feeding  Brief/Interim Summary: 77 year old male with history of acute myelogenous leukemia and myelodysplastic syndrome who was recently sent to a nursing facility.  He was brought back to the hospital with change in mental status.  Patient had cognitive deficits at baseline and appears to have dementia.  He was increasingly lethargic on arrival to the hospital and had a WBC count of 25,000.  He had worsening thrombocytopenia.  Initially it was felt that he may have a urinary tract infection and urine culture did grow positive for enterococcus.  He was treated with intravenous antibiotics, but his condition failed to improve.  Ultimately, he underwent lumbar puncture due to persistent leukocytosis and poor mental status.  This indicated over 900 WBCs in CSF and greater than 600 protein.  This was discussed with infectious disease and it was felt unlikely to be related to an infectious process.  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  His overall prognosis is very poor.  Recommendations were for hospice and end-of-life care.  This was discussed with his caregiver, [name] who is in agreement.  Referral has been made to residential hospice and is currently awaiting a bed.  He is currently on a morphine drip for respiratory distress/pain.  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   feeding supplement (ENSURE ENLIVE) Liqd Take 237 mLs by mouth 2 (two) times daily between meals.   levothyroxine 150 MCG tablet Commonly known as:  SYNTHROID, LEVOTHROID Take 1 tablet (150 mcg total) by mouth daily.   pantoprazole 40 MG tablet Commonly known as:  PROTONIX       No Known Allergies  Consultations:     Procedures/Studies: Dg Chest 1 View  Result Date: 03/19/2018 CLINICAL DATA:  Found lying on the floor, weakness EXAM: CHEST  1 VIEW COMPARISON:  Portable chest x-ray of 11/02/2017 FINDINGS: No active infiltrate or effusion is seen. Mediastinal and hilar contours are stable and mild cardiomegaly is stable. No acute bony abnormality seen, with the bones appearing somewhat osteopenic. IMPRESSION: Stable mild cardiomegaly.  No active lung disease. Electronically Signed   By: Ivar Drape M.D.   On: 03/19/2018 12:19   Dg Lumbar Spine Complete  Result Date: 03/19/2018 CLINICAL DATA:  Found on for following fall EXAM: LUMBAR SPINE - COMPLETE 4+ VIEW COMPARISON:  None. FINDINGS: Five lumbar type vertebral bodies are well visualized. Vertebral body height is well maintained. No pars defects are  noted. No anterolisthesis is seen. Disc space narrowing is noted at L3-4 L4-5 and L5-S1 with associated osteophytes. No soft tissue abnormality is noted. IMPRESSION: Degenerative change without acute abnormality. Electronically Signed   By: Inez Catalina M.D.   On:  03/19/2018 12:21   Ct Head Wo Contrast  Result Date: 03/21/2018 CLINICAL DATA:  77 y/o  M; unwitnessed fall. EXAM: CT HEAD WITHOUT CONTRAST TECHNIQUE: Contiguous axial images were obtained from the base of the skull through the vertex without intravenous contrast. COMPARISON:  03/19/2018 and 11 20 CT head. FINDINGS: Brain: No evidence of acute infarction, hemorrhage, extra-axial collection or mass lesion/mass effect. Few stable subtle linear densities radiating out from the ventricles, probably developmental venous anomalies or other prominent vascular structures. Stable chronic microvascular ischemic changes and volume loss of the brain. Lateral and third ventriculomegaly (Evans index 0.46) with mild scalloping of the margins and relative crowding of sulci at the vertex. Vascular: Calcific atherosclerosis of carotid siphons. No hyperdense vessel identified. 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 No calvarial fracture. Sinuses/Orbits: Mild mucosal thickening of the left maxillary sinus. Additional visible paranasal sinuses and the mastoid air cells are normally aerated. Other: None. IMPRESSION: 1. No acute intracranial abnormality identified. 2. Lateral and third ventriculomegaly with features suggestive of normal pressure hydrocephalus. Clinical correlation recommended. 3. Stable chronic microvascular ischemic changes and volume loss of the brain. Electronically Signed   By: Kristine Garbe M.D.   On: 03/21/2018 15:08   Ct Head Wo Contrast  Result Date: 03/19/2018 CLINICAL DATA:  Family check to patient this morning, found lying on the floor for unknown amount of time EXAM: CT HEAD WITHOUT CONTRAST CT CERVICAL SPINE WITHOUT CONTRAST TECHNIQUE: Multidetector CT imaging of the head and cervical spine was performed following the standard protocol without intravenous contrast. Multiplanar CT image reconstructions of the cervical spine were also generated. COMPARISON:  03/06/2018  FINDINGS: CT HEAD FINDINGS Brain: There is central and cortical atrophy. Periventricular white matter changes are consistent with small vessel disease. There is no intra or extra-axial fluid collection or mass lesion. The basilar cisterns and ventricles have a normal appearance. There is no CT evidence for acute infarction or hemorrhage. Vascular: There is minimal atherosclerotic calcification of the internal carotid arteries. No hyperdense vessels. Skull: Intact. A circumscribed partially sclerotic lesion in the frontal bone, to the RIGHT of midline measures 2.0 x 1.7 centimeters, focally expanding the frontal bone. Sinuses/Orbits: No acute finding. Other: None CT CERVICAL SPINE FINDINGS Alignment: There is moderate degenerative change in the midcervical spine. Convex RIGHT scoliosis versus positioning. Skull base and vertebrae: No acute fracture. No primary bone lesion or focal pathologic process. Soft tissues and spinal canal: No prevertebral fluid or swelling. No visible canal hematoma. Disc levels: Moderate disc height loss and osteophytes at C4-5, C5-6, C6-7. Upper chest: Negative. Other: There is atherosclerotic calcification of the carotid arteries. IMPRESSION: 1.  No evidence for acute intracranial abnormality. 2. Atrophy and small vessel disease. 3. No acute cervical spine fracture. Moderate mid cervical spondylosis. 4. 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 No further evaluation is felt to be necessary. Electronically Signed   By: Nolon Nations M.D.   On: 03/19/2018 12:27   Ct Cervical Spine Wo Contrast  Result Date: 03/19/2018 CLINICAL DATA:  Family check to patient this morning, found lying on the floor for unknown amount of time EXAM: CT HEAD WITHOUT CONTRAST CT CERVICAL SPINE WITHOUT CONTRAST TECHNIQUE: Multidetector CT imaging of  the head and cervical spine was performed following the standard protocol without intravenous contrast. Multiplanar CT image  reconstructions of the cervical spine were also generated. COMPARISON:  03/06/2018 FINDINGS: CT HEAD FINDINGS Brain: There is central and cortical atrophy. Periventricular white matter changes are consistent with small vessel disease. There is no intra or extra-axial fluid collection or mass lesion. The basilar cisterns and ventricles have a normal appearance. There is no CT evidence for acute infarction or hemorrhage. Vascular: There is minimal atherosclerotic calcification of the internal carotid arteries. No hyperdense vessels. Skull: Intact. A circumscribed partially sclerotic lesion in the frontal bone, to the RIGHT of midline measures 2.0 x 1.7 centimeters, focally expanding the frontal bone. Sinuses/Orbits: No acute finding. Other: None CT CERVICAL SPINE FINDINGS Alignment: There is moderate degenerative change in the midcervical spine. Convex RIGHT scoliosis versus positioning. Skull base and vertebrae: No acute fracture. No primary bone lesion or focal pathologic process. Soft tissues and spinal canal: No prevertebral fluid or swelling. No visible canal hematoma. Disc levels: Moderate disc height loss and osteophytes at C4-5, C5-6, C6-7. Upper chest: Negative. Other: There is atherosclerotic calcification of the carotid arteries. IMPRESSION: 1.  No evidence for acute intracranial abnormality. 2. Atrophy and small vessel disease. 3. No acute cervical spine fracture. Moderate mid cervical spondylosis. 4. 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 No further evaluation is felt to be necessary. Electronically Signed   By: Nolon Nations M.D.   On: 03/19/2018 12:27   Mr Jodene Nam Head Wo Contrast  Result Date: 04/09/2018 CLINICAL DATA:  Altered mental status. Patient does not communicate. EXAM: MRI HEAD WITHOUT CONTRAST MRA HEAD WITHOUT CONTRAST TECHNIQUE: Multiplanar, multiecho pulse sequences of the brain and surrounding structures were obtained without intravenous  contrast. Angiographic images of the head were obtained using MRA technique without contrast. COMPARISON:  CT head 03/21/2018. FINDINGS: Significant motion degradation. Study is of marginal diagnostic utility. MRI HEAD FINDINGS Brain: No visible restricted diffusion. Advanced ventriculomegaly affecting the lateral, and third ventricles, greater than fourth ventricle, possible aqueductal stenosis versus normal pressure hydrocephalus. Periventricular T2 and FLAIR hyperintensities suggestive of transependymal absorption. Superimposed cerebral and cerebellar atrophy. No intra-axial mass lesion or extra-axial fluid. Vascular: Reported separately. Skull and upper cervical spine: Unable to assess. Sinuses/Orbits: No visible layering fluid.  No orbital masses. Other: None MRA HEAD FINDINGS There is patency of the internal carotid arteries, and basilar artery. The vertebrals are patent and codominant. There is no intracranial large vessel occlusion affecting the anterior or middle cerebral arteries. Gross patency of the posterior cerebral arteries is established. Unable to assess for possible stenosis. IMPRESSION: Motion degraded exam of marginal diagnostic utility. No visible acute stroke or mass lesion. Advanced ventriculomegaly, likely aqueductal stenosis versus normal pressure hydrocephalus. Major intracranial vessels are patent. Electronically Signed   By: Staci Righter M.D.   On: 04/09/2018 12:52   Mr Brain Wo Contrast  Result Date: 04/09/2018 CLINICAL DATA:  Altered mental status. Patient does not communicate. EXAM: MRI HEAD WITHOUT CONTRAST MRA HEAD WITHOUT CONTRAST TECHNIQUE: Multiplanar, multiecho pulse sequences of the brain and surrounding structures were obtained without intravenous contrast. Angiographic images of the head were obtained using MRA technique without contrast. COMPARISON:  CT head 03/21/2018. FINDINGS: Significant motion degradation. Study is of marginal diagnostic utility. MRI HEAD FINDINGS  Brain: No visible restricted diffusion. Advanced ventriculomegaly affecting the lateral, and third ventricles, greater than fourth ventricle, possible aqueductal stenosis versus normal pressure hydrocephalus. Periventricular T2 and FLAIR hyperintensities suggestive of  transependymal absorption. Superimposed cerebral and cerebellar atrophy. No intra-axial mass lesion or extra-axial fluid. Vascular: Reported separately. Skull and upper cervical spine: Unable to assess. Sinuses/Orbits: No visible layering fluid.  No orbital masses. Other: None MRA HEAD FINDINGS There is patency of the internal carotid arteries, and basilar artery. The vertebrals are patent and codominant. There is no intracranial large vessel occlusion affecting the anterior or middle cerebral arteries. Gross patency of the posterior cerebral arteries is established. Unable to assess for possible stenosis. IMPRESSION: Motion degraded exam of marginal diagnostic utility. No visible acute stroke or mass lesion. Advanced ventriculomegaly, likely aqueductal stenosis versus normal pressure hydrocephalus. Major intracranial vessels are patent. Electronically Signed   By: Staci Righter M.D.   On: 04/09/2018 12:52   Portable Chest 1 View  Result Date: 04/09/2018 CLINICAL DATA:  Acute onset of sepsis and fever. Altered mental status. EXAM: PORTABLE CHEST 1 VIEW COMPARISON:  Chest radiograph performed 04/08/2018 FINDINGS: The lungs are well-aerated. Pulmonary vascularity is at the upper limits of normal. There is no evidence of focal opacification, pleural effusion or pneumothorax. The cardiomediastinal silhouette is within normal limits. A left-sided chest port is noted ending about the distal SVC. No acute osseous abnormalities are seen. IMPRESSION: No acute cardiopulmonary process seen. Electronically Signed   By: Garald Balding M.D.   On: 04/09/2018 05:53   Dg Chest Portable 1 View  Result Date: 04/08/2018 CLINICAL DATA:  Patient unresponsive  today. EXAM: PORTABLE CHEST 1 VIEW COMPARISON:  Single-view of the chest 03/19/2018. PA and lateral chest 11/20/2012. FINDINGS: Port-A-Cath is in place. Lung volumes are somewhat low but the lungs are clear. Heart size is normal. Aortic atherosclerosis is noted. No pneumothorax or pleural fluid. IMPRESSION: No acute disease. Atherosclerosis. Electronically Signed   By: Inge Rise M.D.   On: 04/08/2018 15:40   Dg Fluoro Guide Lumbar Puncture  Result Date: 04/11/2018 CLINICAL DATA:  Altered mental status, leukocytosis, fever, question meningitis or encephalitis EXAM: DIAGNOSTIC LUMBAR PUNCTURE UNDER FLUOROSCOPIC GUIDANCE FLUOROSCOPY TIME:  Fluoroscopy Time:  0 minutes 6 seconds Radiation Exposure Index (if provided by the fluoroscopic device): 1.1 mGy Number of Acquired Spot Images: 1 PROCEDURE: Patient unable to provide consent for procedure due to altered mental status Note of medical necessity for procedure placed on chart by Dr. Roderic Palau. Patient placed prone. L4-L5 disc space was localized under fluoroscopy. Skin prepped and draped in usual sterile fashion. Skin and soft tissues anesthetized with 1.5 mL of 1% lidocaine. 22 gauge needle was advanced into the spinal canal where clear yellow colored CSF was encountered with an opening pressure of 5 cm H2O (prone). 10 mL of CSF was obtained in 4 tubes for requested analysis. Procedure tolerated very well by patient without immediate complication. IMPRESSION: Fluoroscopic guided lumbar puncture as above. Electronically Signed   By: Lavonia Dana M.D.   On: 04/11/2018 12:52       Subjective: Lethargic, does not respond  Discharge Exam: Vitals:   04/11/18 2204 03/21/2018 0550 03/19/2018 0755 04/12/2018 1544  BP: (!) 162/88 138/70 (!) 115/55 (!) 96/54  Pulse: (!) 107 98 98 88  Resp: 19 19 (!) 40 (!) 22  Temp:   99.8 F (37.7 C) 97.8 F (36.6 C)  TempSrc:   Rectal Oral  SpO2: 98% 96% 98% 98%  Weight:      Height:        General: Pt is lethargic,  does not respond Cardiovascular: RRR, S1/S2 +, no rubs, no gallops Respiratory: CTA bilaterally, no wheezing, no  rhonchi Abdominal: Soft, NT, ND, bowel sounds + Extremities: no edema, no cyanosis    The results of significant diagnostics from this hospitalization (including imaging, microbiology, ancillary and laboratory) are listed below for reference.     Microbiology: Recent Results (from the past 240 hour(s))  Blood Culture (routine x 2)     Status: None (Preliminary result)   Collection Time: 04/08/18  3:15 PM  Result Value Ref Range Status   Specimen Description BLOOD LEFT HAND  Final   Special Requests   Final    BOTTLES DRAWN AEROBIC AND ANAEROBIC Blood Culture adequate volume   Culture   Final    NO GROWTH 4 DAYS Performed at [facility], [address] [place], [place] [address]    Report Status PENDING  Incomplete  Blood Culture (routine x 2)     Status: None (Preliminary result)   Collection Time: 04/08/18  3:20 PM  Result Value Ref Range Status   Specimen Description BLOOD RIGHT HAND  Final   Special Requests   Final    BOTTLES DRAWN AEROBIC AND ANAEROBIC Blood Culture adequate volume   Culture   Final    NO GROWTH 4 DAYS Performed at [facility], [address], [place], [place] [address]    Report Status PENDING  Incomplete  Urine Culture     Status: Abnormal   Collection Time: 04/08/18  5:05 PM  Result Value Ref Range Status   Specimen Description   Final    URINE, RANDOM Performed at [facility], [address] [place], [place] [address]    Special Requests   Final    NONE Performed at [facility], [address], [place], [place] [address]    Culture >=100,000 COLONIES/mL ENTEROCOCCUS FAECALIS (A)  Final   Report Status 04/03/2018 FINAL  Final   Organism ID, Bacteria ENTEROCOCCUS FAECALIS (A)  Final      Susceptibility   Enterococcus faecalis - MIC*    AMPICILLIN <=2 SENSITIVE Sensitive     LEVOFLOXACIN >=8 RESISTANT Resistant      NITROFURANTOIN <=16 SENSITIVE Sensitive     VANCOMYCIN 1 SENSITIVE Sensitive     * >=100,000 COLONIES/mL ENTEROCOCCUS FAECALIS  CSF culture     Status: None (Preliminary result)   Collection Time: 04/11/18 12:00 PM  Result Value Ref Range Status   Specimen Description   Final    CSF Performed at [facility], [address] [place], [place] [address]    Special Requests   Final    NONE Performed at [facility], [address], [place], [place] [address]    Gram Stain   Final    WBC PRESENT, PREDOMINANTLY MONONUCLEAR NO ORGANISMS SEEN CYTOSPIN SMEAR Performed at [facility], [address], [place], [place] [address]    Culture   Final    NO GROWTH < 24 HOURS Performed at [facility], [place] [address] [place], [place] [address]    Report Status PENDING  Incomplete     Labs: BNP (last 3 results) No results for input(s): BNP in the last 8760 hours. Basic Metabolic Panel: Recent Labs  Lab 04/08/18 1515 04/08/18 1522 04/09/18 0550 04/10/18 0538 04/11/18 0457 03/18/2018 0442  NA 144 147* 149* 145 142  --   K 4.4 4.4 3.7 3.6 3.8  --   CL 114* 112* 121* 118* 114*  --   CO2 23  --  23 24 21*  --   GLUCOSE 134* 133* 124* 137* 102*  --  BUN 51* 42* 38* 28* 22  --   CREATININE 1.35* 1.30* 1.18 0.98 0.82 0.74  CALCIUM 9.0  --  8.3* 8.1* 8.3*  --   MG  --   --  2.5*  --   --   --    Liver Function Tests: Recent Labs  Lab 04/08/18 1515  AST 32  ALT 33  ALKPHOS 60  BILITOT 0.6  PROT 7.0  ALBUMIN 3.6   No results for input(s): LIPASE, AMYLASE in the last 168 hours. Recent Labs  Lab 04/09/18 1157  AMMONIA 22   CBC: Recent Labs  Lab 04/08/18 1515 04/08/18 1522 04/09/18 0550 04/10/18 0538 04/11/18 0457  WBC 25.1*  --  22.0* 20.4* 24.1*  NEUTROABS 10.1*  --  10.4* 8.7* 9.3*  HGB 8.8* 8.8* 7.3* 8.2* 7.8*  HCT 28.6* 26.0* 24.1* 26.4* 24.6*  MCV 107.5*  --  111.6* 103.9* 105.6*  PLT 59*  --  44* 30* 61*   Cardiac Enzymes: No results for input(s):  CKTOTAL, CKMB, CKMBINDEX, TROPONINI in the last 168 hours. BNP: Invalid input(s): POCBNP CBG: Recent Labs  Lab 04/08/18 1510  GLUCAP 123*   D-Dimer No results for input(s): DDIMER in the last 72 hours. Hgb A1c No results for input(s): HGBA1C in the last 72 hours. Lipid Profile No results for input(s): CHOL, HDL, LDLCALC, TRIG, CHOLHDL, LDLDIRECT in the last 72 hours. Thyroid function studies No results for input(s): TSH, T4TOTAL, T3FREE, THYROIDAB in the last 72 hours.  Invalid input(s): FREET3 Anemia work up No results for input(s): VITAMINB12, FOLATE, FERRITIN, TIBC, IRON, RETICCTPCT in the last 72 hours. Urinalysis    Component Value Date/Time   COLORURINE YELLOW 04/08/2018 1515   APPEARANCEUR HAZY (A) 04/08/2018 1515   LABSPEC 1.013 04/08/2018 1515   PHURINE 7.0 04/08/2018 1515   GLUCOSEU NEGATIVE 04/08/2018 1515   HGBUR SMALL (A) 04/08/2018 1515   BILIRUBINUR NEGATIVE 04/08/2018 1515   KETONESUR NEGATIVE 04/08/2018 1515   PROTEINUR 100 (A) 04/08/2018 1515   NITRITE NEGATIVE 04/08/2018 1515   LEUKOCYTESUR MODERATE (A) 04/08/2018 1515   Sepsis Labs Invalid input(s): PROCALCITONIN,  WBC,  LACTICIDVEN Microbiology Recent Results (from the past 240 hour(s))  Blood Culture (routine x 2)     Status: None (Preliminary result)   Collection Time: 04/08/18  3:15 PM  Result Value Ref Range Status   Specimen Description BLOOD LEFT HAND  Final   Special Requests   Final    BOTTLES DRAWN AEROBIC AND ANAEROBIC Blood Culture adequate volume   Culture   Final    NO GROWTH 4 DAYS Performed at [facility], [address], [place], [place] [address]    Report Status PENDING  Incomplete  Blood Culture (routine x 2)     Status: None (Preliminary result)   Collection Time: 04/08/18  3:20 PM  Result Value Ref Range Status   Specimen Description BLOOD RIGHT HAND  Final   Special Requests   Final    BOTTLES DRAWN AEROBIC AND ANAEROBIC Blood Culture adequate volume   Culture   Final     NO GROWTH 4 DAYS Performed at [facility], [address], [place], [place] [address]    Report Status PENDING  Incomplete  Urine Culture     Status: Abnormal   Collection Time: 04/08/18  5:05 PM  Result Value Ref Range Status   Specimen Description   Final    URINE, RANDOM Performed at [facility], [address], [place], [place] [address]    Special  Requests   Final    NONE Performed at [facility], [address] [place], [place] [address]    Culture >=100,000 COLONIES/mL ENTEROCOCCUS FAECALIS (A)  Final   Report Status 04/10/2018 FINAL  Final   Organism ID, Bacteria ENTEROCOCCUS FAECALIS (A)  Final      Susceptibility   Enterococcus faecalis - MIC*    AMPICILLIN <=2 SENSITIVE Sensitive     LEVOFLOXACIN >=8 RESISTANT Resistant     NITROFURANTOIN <=16 SENSITIVE Sensitive     VANCOMYCIN 1 SENSITIVE Sensitive     * >=100,000 COLONIES/mL ENTEROCOCCUS FAECALIS  CSF culture     Status: None (Preliminary result)   Collection Time: 04/11/18 12:00 PM  Result Value Ref Range Status   Specimen Description   Final    CSF Performed at [facility], [address], [place], [place] [address]    Special Requests   Final    NONE Performed at [facility], [address] [place], [place] [address]    Gram Stain   Final    WBC PRESENT, PREDOMINANTLY MONONUCLEAR NO ORGANISMS SEEN CYTOSPIN SMEAR Performed at [facility], [address], [place], [place] [address]    Culture   Final    NO GROWTH < 24 HOURS Performed at [facility], [place] [address] [place], [place] [address]    Report Status PENDING  Incomplete     Time coordinating discharge: 103mins  SIGNED:   Kathie Dike, MD  [facility] 04/11/2018, 6:16 PM Pager   If 7PM-7AM, please contact night-coverage [email or web address] Password [email or web address]

## 2018-04-12 NOTE — Progress Notes (Signed)
Patient is restless, diaphoretic, and respirations are 40 at this time. Morphine and Tylenol suppository given for comfort. Will continue to monitor.

## 2018-04-13 DIAGNOSIS — C9200 Acute myeloblastic leukemia, not having achieved remission: Principal | ICD-10-CM

## 2018-04-13 LAB — CULTURE, BLOOD (ROUTINE X 2)
Culture: NO GROWTH
Culture: NO GROWTH
Special Requests: ADEQUATE
Special Requests: ADEQUATE

## 2018-04-13 LAB — HERPES SIMPLEX VIRUS(HSV) DNA BY PCR: HSV 2 DNA: NEGATIVE

## 2018-04-13 LAB — HSV DNA BY PCR (REFERENCE LAB): HSV 1 DNA: NEGATIVE

## 2018-04-14 LAB — CSF CULTURE W GRAM STAIN: Culture: NO GROWTH

## 2018-04-16 ENCOUNTER — Ambulatory Visit (HOSPITAL_COMMUNITY): Payer: Medicare HMO | Admitting: Hematology

## 2018-04-17 NOTE — Discharge Summary (Signed)
Death Summary  Omar Lee SEG:[id number] DOB: [birth year] DOA: 05/04/2018  PCP: [name], MD  Admit date: May 04, 2018 Date of Death: 05-May-2018 Time of Death: 0323  History of present illness:  Omar Lee is a 78 y.o. male with a history of acute myoblastic leukemia, leukemic meningitis, dementia and hypothyroidism, admitted to hospice service for end-of-life care.  Please refer to discharge summary done on May 05, 2023 regarding his hospital course for further details.  He was admitted to [facility] service on 05-May-2023 for symptomatic/end-of-life care.  On 2023-05-06 at 0323, patient found without heart sounds or respirations.  He was pronounced dead at that time.  Attempt was made to reach family friend who was involved in his care.  Voice message was left for her to call the hospital.   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    Significant Diagnostic Studies: Dg Chest 1 View  Result Date: 03/19/2018 CLINICAL DATA:  Found lying on the floor, weakness EXAM: CHEST  1 VIEW COMPARISON:  Portable chest x-ray of 11/02/2017 FINDINGS: No active infiltrate or effusion is seen. Mediastinal and hilar contours are stable and mild cardiomegaly is stable. No acute bony abnormality seen, with the bones appearing somewhat osteopenic. IMPRESSION: Stable mild cardiomegaly.  No active lung disease. Electronically Signed   By: Ivar Drape M.D.   On: 03/19/2018 12:19   Dg Lumbar Spine Complete  Result Date: 03/19/2018 CLINICAL DATA:  Found on for following fall EXAM: LUMBAR SPINE - COMPLETE 4+ VIEW COMPARISON:  None. FINDINGS: Five lumbar type vertebral bodies are well visualized. Vertebral body height is well maintained. No  pars defects are noted. No anterolisthesis is seen. Disc space narrowing is noted at L3-4 L4-5 and L5-S1 with associated osteophytes. No soft tissue abnormality is noted. IMPRESSION: Degenerative change without acute abnormality. Electronically Signed   By: Inez Catalina M.D.   On: 03/19/2018 12:21   Ct Head Wo Contrast  Result Date: 03/21/2018 CLINICAL DATA:  78 y/o  M; unwitnessed fall. EXAM: CT HEAD WITHOUT CONTRAST TECHNIQUE: Contiguous axial images were obtained from the base of the skull through the vertex without intravenous contrast. COMPARISON:  03/19/2018 and 11 20 CT head. FINDINGS: Brain: No evidence of acute infarction, hemorrhage, extra-axial collection or mass lesion/mass effect. Few stable subtle linear densities radiating out from the ventricles, probably developmental venous anomalies or other prominent vascular structures. Stable chronic microvascular ischemic changes and volume loss of the brain. Lateral and third ventriculomegaly (Evans index 0.46) with mild scalloping of the margins and relative crowding of sulci at the vertex. Vascular: Calcific atherosclerosis of carotid siphons. No hyperdense vessel identified. 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 No calvarial fracture. Sinuses/Orbits: Mild mucosal thickening of the left maxillary sinus. Additional visible paranasal sinuses and the mastoid air cells are normally aerated. Other: None. IMPRESSION: 1. No acute intracranial abnormality identified. 2. Lateral and third ventriculomegaly with features suggestive of normal pressure hydrocephalus. Clinical correlation recommended. 3. Stable chronic microvascular ischemic changes and volume loss of the brain. Electronically Signed   By: Kristine Garbe M.D.   On: 03/21/2018 15:08   Ct Head Wo Contrast  Result Date: 03/19/2018 CLINICAL DATA:  Family check to patient this morning, found lying on the floor for  unknown amount of time EXAM: CT HEAD WITHOUT CONTRAST  CT CERVICAL SPINE WITHOUT CONTRAST TECHNIQUE: Multidetector CT imaging of the head and cervical spine was performed following the standard protocol without intravenous contrast. Multiplanar CT image reconstructions of the cervical spine were also generated. COMPARISON:  03/06/2018 FINDINGS: CT HEAD FINDINGS Brain: There is central and cortical atrophy. Periventricular white matter changes are consistent with small vessel disease. There is no intra or extra-axial fluid collection or mass lesion. The basilar cisterns and ventricles have a normal appearance. There is no CT evidence for acute infarction or hemorrhage. Vascular: There is minimal atherosclerotic calcification of the internal carotid arteries. No hyperdense vessels. Skull: Intact. A circumscribed partially sclerotic lesion in the frontal bone, to the RIGHT of midline measures 2.0 x 1.7 centimeters, focally expanding the frontal bone. Sinuses/Orbits: No acute finding. Other: None CT CERVICAL SPINE FINDINGS Alignment: There is moderate degenerative change in the midcervical spine. Convex RIGHT scoliosis versus positioning. Skull base and vertebrae: No acute fracture. No primary bone lesion or focal pathologic process. Soft tissues and spinal canal: No prevertebral fluid or swelling. No visible canal hematoma. Disc levels: Moderate disc height loss and osteophytes at C4-5, C5-6, C6-7. Upper chest: Negative. Other: There is atherosclerotic calcification of the carotid arteries. IMPRESSION: 1.  No evidence for acute intracranial abnormality. 2. Atrophy and small vessel disease. 3. No acute cervical spine fracture. Moderate mid cervical spondylosis. 4. 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 No further evaluation is felt to be necessary. Electronically Signed   By: Nolon Nations M.D.   On: 03/19/2018 12:27   Ct Cervical Spine Wo Contrast  Result Date: 03/19/2018 CLINICAL DATA:  Family check to patient this morning,  found lying on the floor for unknown amount of time EXAM: CT HEAD WITHOUT CONTRAST CT CERVICAL SPINE WITHOUT CONTRAST TECHNIQUE: Multidetector CT imaging of the head and cervical spine was performed following the standard protocol without intravenous contrast. Multiplanar CT image reconstructions of the cervical spine were also generated. COMPARISON:  03/06/2018 FINDINGS: CT HEAD FINDINGS Brain: There is central and cortical atrophy. Periventricular white matter changes are consistent with small vessel disease. There is no intra or extra-axial fluid collection or mass lesion. The basilar cisterns and ventricles have a normal appearance. There is no CT evidence for acute infarction or hemorrhage. Vascular: There is minimal atherosclerotic calcification of the internal carotid arteries. No hyperdense vessels. Skull: Intact. A circumscribed partially sclerotic lesion in the frontal bone, to the RIGHT of midline measures 2.0 x 1.7 centimeters, focally expanding the frontal bone. Sinuses/Orbits: No acute finding. Other: None CT CERVICAL SPINE FINDINGS Alignment: There is moderate degenerative change in the midcervical spine. Convex RIGHT scoliosis versus positioning. Skull base and vertebrae: No acute fracture. No primary bone lesion or focal pathologic process. Soft tissues and spinal canal: No prevertebral fluid or swelling. No visible canal hematoma. Disc levels: Moderate disc height loss and osteophytes at C4-5, C5-6, C6-7. Upper chest: Negative. Other: There is atherosclerotic calcification of the carotid arteries. IMPRESSION: 1.  No evidence for acute intracranial abnormality. 2. Atrophy and small vessel disease. 3. No acute cervical spine fracture. Moderate mid cervical spondylosis. 4. 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 No further evaluation is felt to be necessary. Electronically Signed   By: Nolon Nations M.D.   On: 03/19/2018 12:27   Mr Jodene Nam Head Wo  Contrast  Result Date: 04/09/2018 CLINICAL DATA:  Altered mental status. Patient does not communicate.  EXAM: MRI HEAD WITHOUT CONTRAST MRA HEAD WITHOUT CONTRAST TECHNIQUE: Multiplanar, multiecho pulse sequences of the brain and surrounding structures were obtained without intravenous contrast. Angiographic images of the head were obtained using MRA technique without contrast. COMPARISON:  CT head 03/21/2018. FINDINGS: Significant motion degradation. Study is of marginal diagnostic utility. MRI HEAD FINDINGS Brain: No visible restricted diffusion. Advanced ventriculomegaly affecting the lateral, and third ventricles, greater than fourth ventricle, possible aqueductal stenosis versus normal pressure hydrocephalus. Periventricular T2 and FLAIR hyperintensities suggestive of transependymal absorption. Superimposed cerebral and cerebellar atrophy. No intra-axial mass lesion or extra-axial fluid. Vascular: Reported separately. Skull and upper cervical spine: Unable to assess. Sinuses/Orbits: No visible layering fluid.  No orbital masses. Other: None MRA HEAD FINDINGS There is patency of the internal carotid arteries, and basilar artery. The vertebrals are patent and codominant. There is no intracranial large vessel occlusion affecting the anterior or middle cerebral arteries. Gross patency of the posterior cerebral arteries is established. Unable to assess for possible stenosis. IMPRESSION: Motion degraded exam of marginal diagnostic utility. No visible acute stroke or mass lesion. Advanced ventriculomegaly, likely aqueductal stenosis versus normal pressure hydrocephalus. Major intracranial vessels are patent. Electronically Signed   By: Staci Righter M.D.   On: 04/09/2018 12:52   Mr Brain Wo Contrast  Result Date: 04/09/2018 CLINICAL DATA:  Altered mental status. Patient does not communicate. EXAM: MRI HEAD WITHOUT CONTRAST MRA HEAD WITHOUT CONTRAST TECHNIQUE: Multiplanar, multiecho pulse sequences of the brain  and surrounding structures were obtained without intravenous contrast. Angiographic images of the head were obtained using MRA technique without contrast. COMPARISON:  CT head 03/21/2018. FINDINGS: Significant motion degradation. Study is of marginal diagnostic utility. MRI HEAD FINDINGS Brain: No visible restricted diffusion. Advanced ventriculomegaly affecting the lateral, and third ventricles, greater than fourth ventricle, possible aqueductal stenosis versus normal pressure hydrocephalus. Periventricular T2 and FLAIR hyperintensities suggestive of transependymal absorption. Superimposed cerebral and cerebellar atrophy. No intra-axial mass lesion or extra-axial fluid. Vascular: Reported separately. Skull and upper cervical spine: Unable to assess. Sinuses/Orbits: No visible layering fluid.  No orbital masses. Other: None MRA HEAD FINDINGS There is patency of the internal carotid arteries, and basilar artery. The vertebrals are patent and codominant. There is no intracranial large vessel occlusion affecting the anterior or middle cerebral arteries. Gross patency of the posterior cerebral arteries is established. Unable to assess for possible stenosis. IMPRESSION: Motion degraded exam of marginal diagnostic utility. No visible acute stroke or mass lesion. Advanced ventriculomegaly, likely aqueductal stenosis versus normal pressure hydrocephalus. Major intracranial vessels are patent. Electronically Signed   By: Staci Righter M.D.   On: 04/09/2018 12:52   Portable Chest 1 View  Result Date: 04/09/2018 CLINICAL DATA:  Acute onset of sepsis and fever. Altered mental status. EXAM: PORTABLE CHEST 1 VIEW COMPARISON:  Chest radiograph performed 04/08/2018 FINDINGS: The lungs are well-aerated. Pulmonary vascularity is at the upper limits of normal. There is no evidence of focal opacification, pleural effusion or pneumothorax. The cardiomediastinal silhouette is within normal limits. A left-sided chest port is noted  ending about the distal SVC. No acute osseous abnormalities are seen. IMPRESSION: No acute cardiopulmonary process seen. Electronically Signed   By: Garald Balding M.D.   On: 04/09/2018 05:53   Dg Chest Portable 1 View  Result Date: 04/08/2018 CLINICAL DATA:  Patient unresponsive today. EXAM: PORTABLE CHEST 1 VIEW COMPARISON:  Single-view of the chest 03/19/2018. PA and lateral chest 11/20/2012. FINDINGS: Port-A-Cath is in place. Lung volumes are somewhat low but the lungs are  clear. Heart size is normal. Aortic atherosclerosis is noted. No pneumothorax or pleural fluid. IMPRESSION: No acute disease. Atherosclerosis. Electronically Signed   By: Inge Rise M.D.   On: 04/08/2018 15:40   Dg Fluoro Guide Lumbar Puncture  Result Date: 04/11/2018 CLINICAL DATA:  Altered mental status, leukocytosis, fever, question meningitis or encephalitis EXAM: DIAGNOSTIC LUMBAR PUNCTURE UNDER FLUOROSCOPIC GUIDANCE FLUOROSCOPY TIME:  Fluoroscopy Time:  0 minutes 6 seconds Radiation Exposure Index (if provided by the fluoroscopic device): 1.1 mGy Number of Acquired Spot Images: 1 PROCEDURE: Patient unable to provide consent for procedure due to altered mental status Note of medical necessity for procedure placed on chart by Dr. Roderic Palau. Patient placed prone. L4-L5 disc space was localized under fluoroscopy. Skin prepped and draped in usual sterile fashion. Skin and soft tissues anesthetized with 1.5 mL of 1% lidocaine. 22 gauge needle was advanced into the spinal canal where clear yellow colored CSF was encountered with an opening pressure of 5 cm H2O (prone). 10 mL of CSF was obtained in 4 tubes for requested analysis. Procedure tolerated very well by patient without immediate complication. IMPRESSION: Fluoroscopic guided lumbar puncture as above. Electronically Signed   By: Lavonia Dana M.D.   On: 04/11/2018 12:52    Microbiology: Recent Results (from the past 240 hour(s))  Blood Culture (routine x 2)     Status: None    Collection Time: 04/08/18  3:15 PM  Result Value Ref Range Status   Specimen Description BLOOD LEFT HAND  Final   Special Requests   Final    BOTTLES DRAWN AEROBIC AND ANAEROBIC Blood Culture adequate volume   Culture   Final    NO GROWTH 5 DAYS Performed at [facility], [address], [place], [place] [address]    Report Status 05-12-18 FINAL  Final  Blood Culture (routine x 2)     Status: None   Collection Time: 04/08/18  3:20 PM  Result Value Ref Range Status   Specimen Description BLOOD RIGHT HAND  Final   Special Requests   Final    BOTTLES DRAWN AEROBIC AND ANAEROBIC Blood Culture adequate volume   Culture   Final    NO GROWTH 5 DAYS Performed at [facility], [address] [place], [place] [address]    Report Status 05-12-2018 FINAL  Final  Urine Culture     Status: Abnormal   Collection Time: 04/08/18  5:05 PM  Result Value Ref Range Status   Specimen Description   Final    URINE, RANDOM Performed at [facility], [address], [place], [place] [address]    Special Requests   Final    NONE Performed at [facility], [address] [place], [place] [address]    Culture >=100,000 COLONIES/mL ENTEROCOCCUS FAECALIS (A)  Final   Report Status 03/21/2018 FINAL  Final   Organism ID, Bacteria ENTEROCOCCUS FAECALIS (A)  Final      Susceptibility   Enterococcus faecalis - MIC*    AMPICILLIN <=2 SENSITIVE Sensitive     LEVOFLOXACIN >=8 RESISTANT Resistant     NITROFURANTOIN <=16 SENSITIVE Sensitive     VANCOMYCIN 1 SENSITIVE Sensitive     * >=100,000 COLONIES/mL ENTEROCOCCUS FAECALIS  CSF culture     Status: None (Preliminary result)   Collection Time: 04/11/18 12:00 PM  Result Value Ref Range Status   Specimen Description   Final    CSF Performed at [facility], [address] [place],  [address]    Special Requests  Final    NONE Performed at [facility], [address], [place], [place] [address]    Gram Stain   Final    WBC PRESENT,  PREDOMINANTLY MONONUCLEAR NO ORGANISMS SEEN CYTOSPIN SMEAR Performed at [facility], [address], [place], [place] [address]    Culture   Final    NO GROWTH 2 DAYS Performed at [facility] [address], [place], [place] [address]    Report Status PENDING  Incomplete     Labs: Basic Metabolic Panel: Recent Labs  Lab 04/08/18 1515 04/08/18 1522 04/09/18 0550 04/10/18 0538 04/11/18 0457 04/08/2018 0442  NA 144 147* 149* 145 142  --   K 4.4 4.4 3.7 3.6 3.8  --   CL 114* 112* 121* 118* 114*  --   CO2 23  --  23 24 21*  --   GLUCOSE 134* 133* 124* 137* 102*  --   BUN 51* 42* 38* 28* 22  --   CREATININE 1.35* 1.30* 1.18 0.98 0.82 0.74  CALCIUM 9.0  --  8.3* 8.1* 8.3*  --   MG  --   --  2.5*  --   --   --    Liver Function Tests: Recent Labs  Lab 04/08/18 1515  AST 32  ALT 33  ALKPHOS 60  BILITOT 0.6  PROT 7.0  ALBUMIN 3.6   No results for input(s): LIPASE, AMYLASE in the last 168 hours. Recent Labs  Lab 04/09/18 1157  AMMONIA 22   CBC: Recent Labs  Lab 04/08/18 1515 04/08/18 1522 04/09/18 0550 04/10/18 0538 04/11/18 0457  WBC 25.1*  --  22.0* 20.4* 24.1*  NEUTROABS 10.1*  --  10.4* 8.7* 9.3*  HGB 8.8* 8.8* 7.3* 8.2* 7.8*  HCT 28.6* 26.0* 24.1* 26.4* 24.6*  MCV 107.5*  --  111.6* 103.9* 105.6*  PLT 59*  --  44* 30* 61*   Cardiac Enzymes: No results for input(s): CKTOTAL, CKMB, CKMBINDEX, TROPONINI in the last 168 hours. D-Dimer No results for input(s): DDIMER in the last 72 hours. BNP: Invalid input(s): POCBNP CBG: Recent Labs  Lab 04/08/18 1510  GLUCAP 123*   Anemia work up No results for input(s): VITAMINB12, FOLATE, FERRITIN, TIBC, IRON, RETICCTPCT in the last 72 hours. Urinalysis    Component Value Date/Time   COLORURINE YELLOW 04/08/2018 1515   APPEARANCEUR HAZY (A) 04/08/2018 1515   LABSPEC 1.013 04/08/2018 1515   PHURINE 7.0 04/08/2018 1515   GLUCOSEU NEGATIVE 04/08/2018 1515   HGBUR SMALL (A) 04/08/2018 1515    BILIRUBINUR NEGATIVE 04/08/2018 1515   KETONESUR NEGATIVE 04/08/2018 1515   PROTEINUR 100 (A) 04/08/2018 1515   NITRITE NEGATIVE 04/08/2018 1515   LEUKOCYTESUR MODERATE (A) 04/08/2018 1515   Sepsis Labs Invalid input(s): PROCALCITONIN,  WBC,  LACTICIDVEN     SIGNED:  Kathie Dike, MD  [facility] 2018-04-27, 5:29 PM Pager   If 7PM-7AM, please contact night-coverage [email or web address] Password [email or web address]

## 2018-04-17 NOTE — Plan of Care (Signed)
Patient expired at 39.

## 2018-04-17 NOTE — Progress Notes (Signed)
Patient expired at 0323, verified with 2 nurses, Iran Sizer, RN assisted.  MD notified, [facility] notified, death packet completed.  RN attempted to notify family friend who is only person listed in contact list, no answer, RN left VM for friend to call hospital.  [facility] contacted SNF to see if any other family contact listed, no other contacts available.  Patient sent to morgue with belongings.   P.J. Linus Mako, RN

## 2018-04-17 DEATH — deceased

## 2019-11-01 IMAGING — CT CT HEAD W/O CM
5 of 7 series · 17 of 47 positions shown, 18 images · non-contrast
Comparison: 03/06/2018

CLINICAL DATA: Family check to patient this morning, found lying on
the floor for unknown amount of time

EXAM:
CT HEAD WITHOUT CONTRAST
CT CERVICAL SPINE WITHOUT CONTRAST
TECHNIQUE: Multidetector CT imaging of the head and cervical spine was
performed following the standard protocol without intravenous
contrast. Multiplanar CT image reconstructions of the cervical spine
were also generated.

[Series 3: head wo · axial · 0.45mm/px · z∈[-94,-14]mm · 3 of 33 slices shown, 4 images]
[im 9/33  brain]
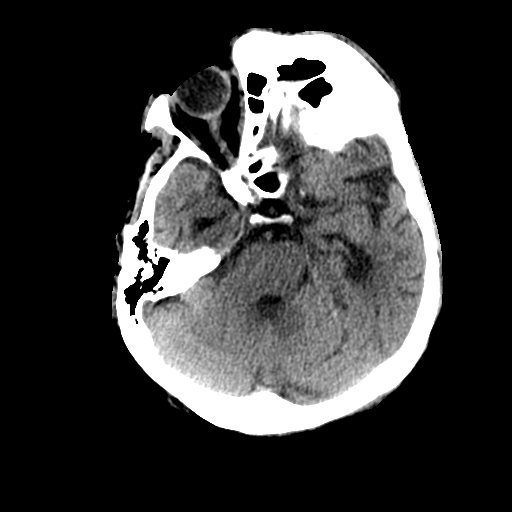
[im 9/33  bone]
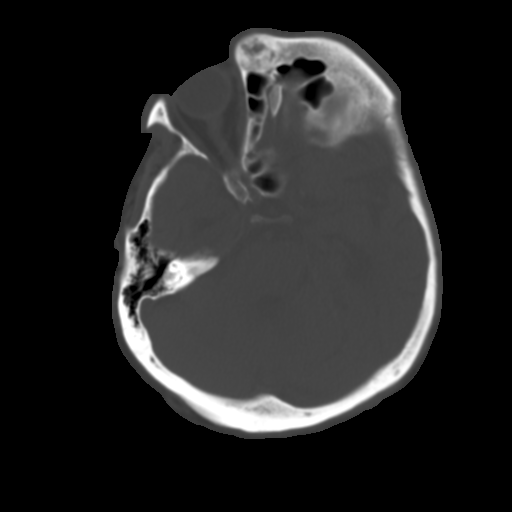
[im 17/33  brain]
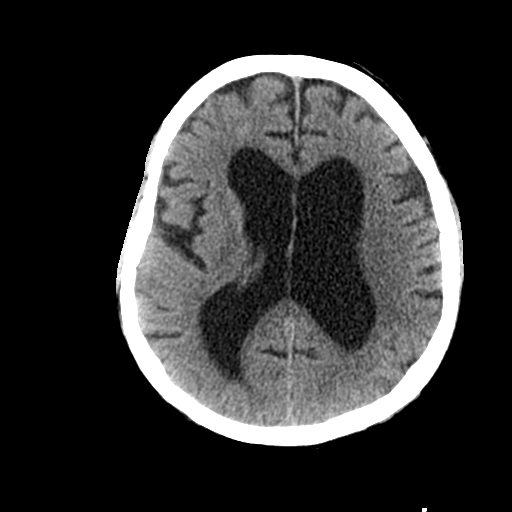
[im 25/33  brain]
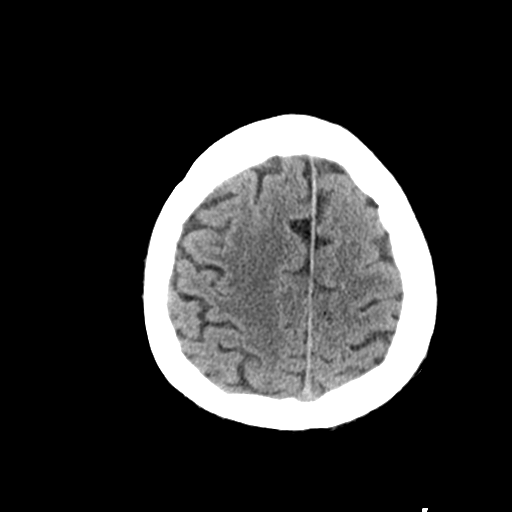

[Series 5: coronal soft tissue · coronal · 0.36mm/px · 3 of 71 slices shown]
[im 18/71  brain]
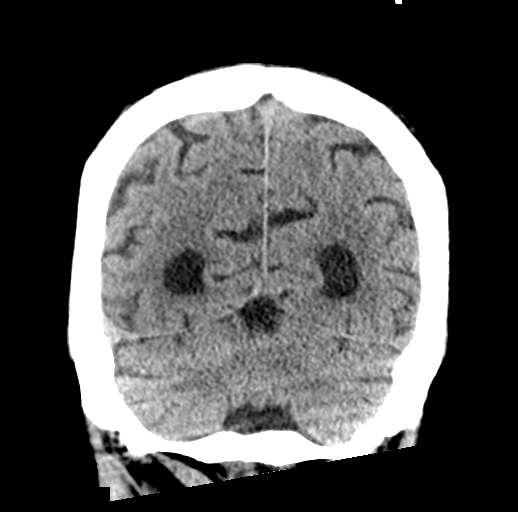
[im 36/71  brain]
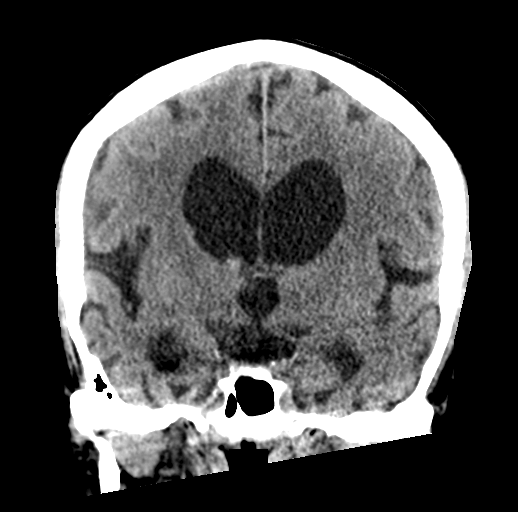
[im 53/71  brain]
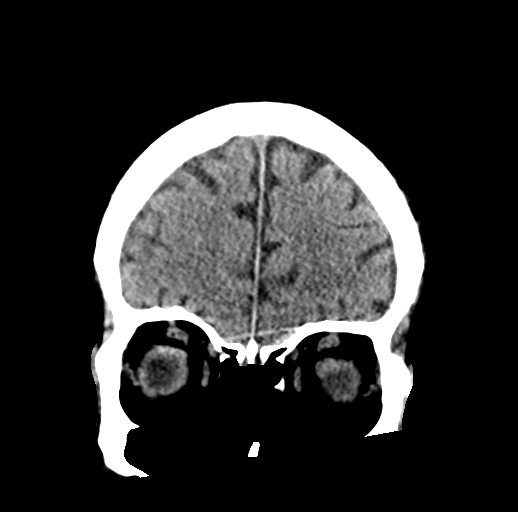

[Series 6: sagittal soft tissue · sagittal · 0.36mm/px · 1 of 60 slices shown]
[im 30/60  brain]
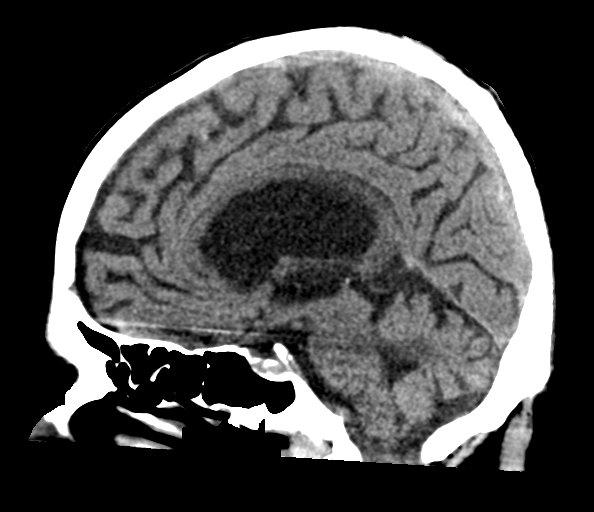

[Series 8: c spine soft · axial · 0.41mm/px · z∈[-274,-244]mm · 2 of 89 slices shown]
[im 8/89  brain]
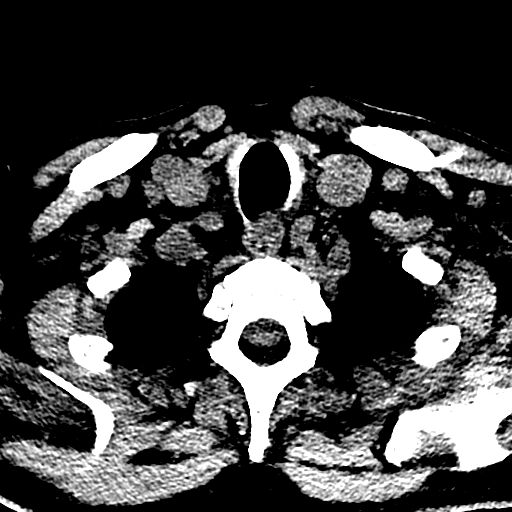
[im 23/89  brain]
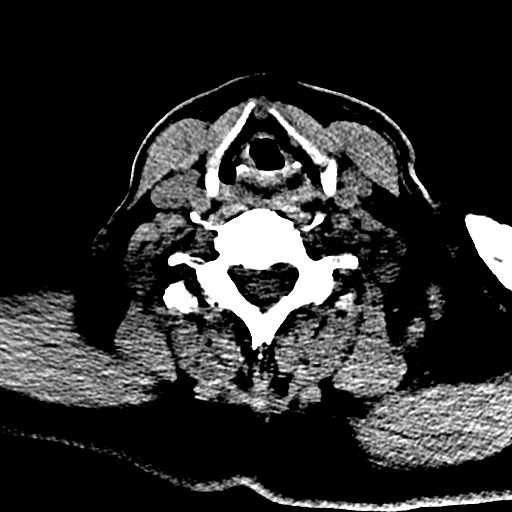

[Series 13: orthogonal bone · axial · 0.21mm/px · z∈[-297,-150]mm · 8 of 84 slices shown]
[im 8/84  bone]
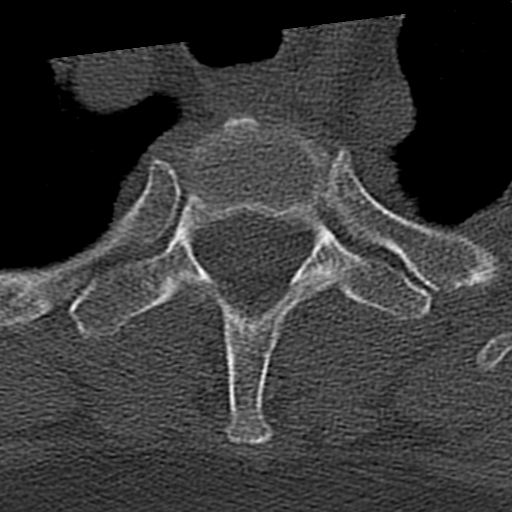
[im 16/84  bone]
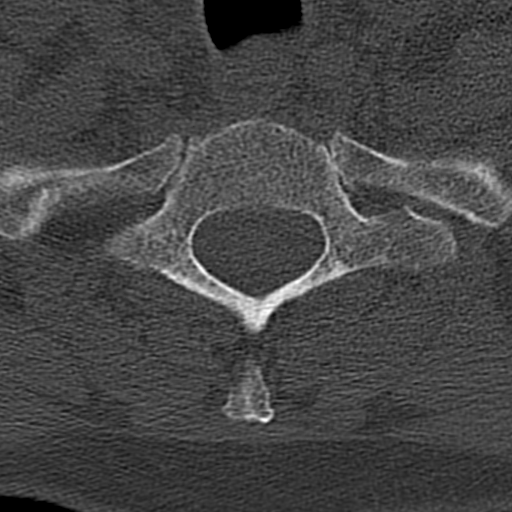
[im 31/84  bone]
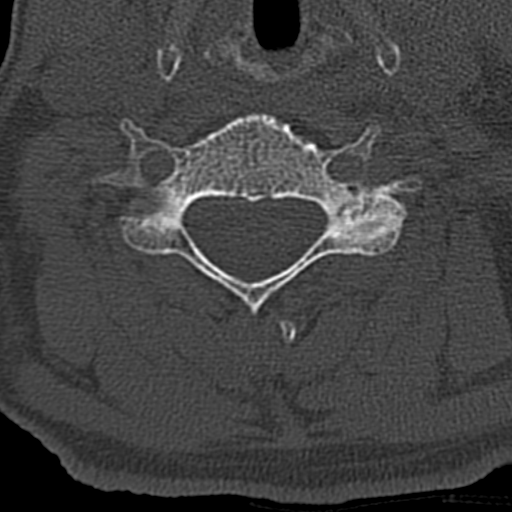
[im 38/84  bone]
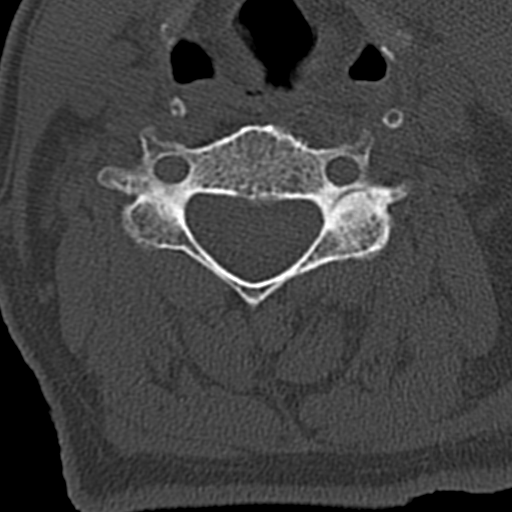
[im 46/84  bone]
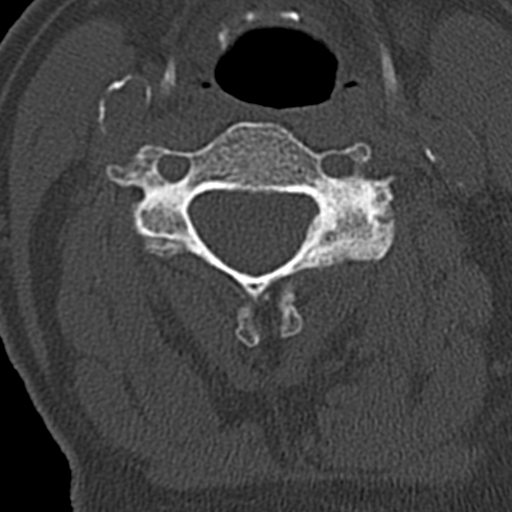
[im 53/84  bone]
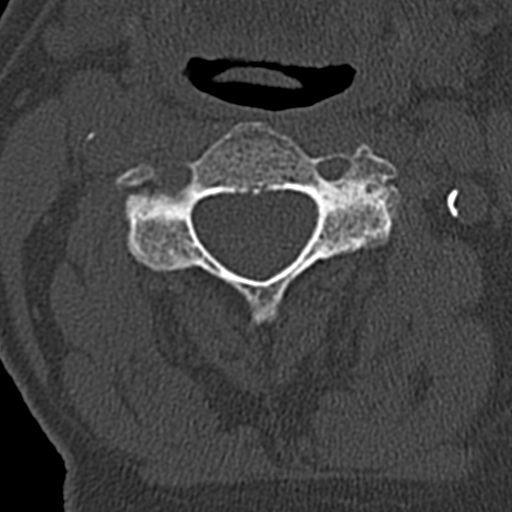
[im 68/84  bone]
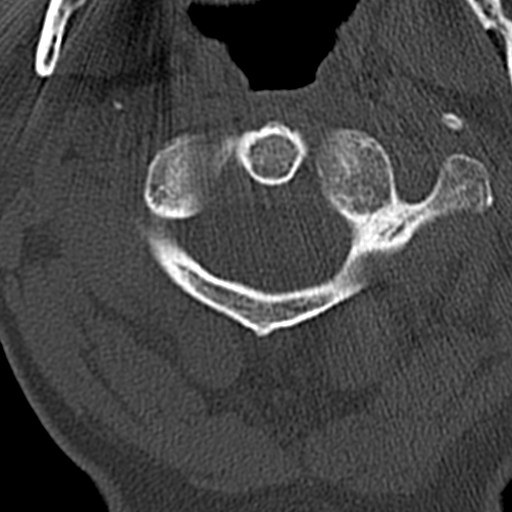
[im 76/84  bone]
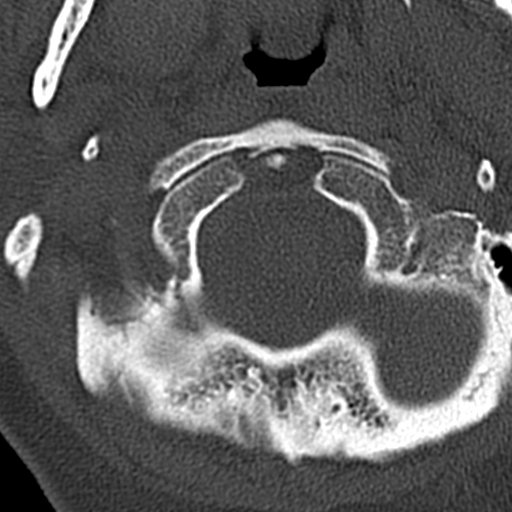

[17 of 47 positions shown; findings below may reference images not displayed]

FINDINGS: CT HEAD FINDINGS

Brain: There is central and cortical atrophy. Periventricular white
matter changes are consistent with small vessel disease. There is no
intra or extra-axial fluid collection or mass lesion. The basilar
cisterns and ventricles have a normal appearance. There is no CT
evidence for acute infarction or hemorrhage.

Vascular: There is minimal atherosclerotic calcification of the
internal carotid arteries. No hyperdense vessels.

Skull: Intact. A circumscribed partially sclerotic lesion in the
frontal bone, to the RIGHT of midline measures 2.0 x
centimeters, focally expanding the frontal bone.

Sinuses/Orbits: No acute finding.

Other: None

CT CERVICAL SPINE FINDINGS

Alignment: There is moderate degenerative change in the midcervical
spine. Convex RIGHT scoliosis versus positioning.

Skull base and vertebrae: No acute fracture. No primary bone lesion
or focal pathologic process.

Soft tissues and spinal canal: No prevertebral fluid or swelling. No
visible canal hematoma.

Disc levels: Moderate disc height loss and osteophytes at C4-5,
C5-6, C6-7.

Upper chest: Negative.

Other: There is atherosclerotic calcification of the carotid
arteries.
IMPRESSION: 1.  No evidence for acute intracranial abnormality.
2. Atrophy and small vessel disease.
3. No acute cervical spine fracture. Moderate mid cervical
spondylosis.
4. 2.0 centimeter RIGHT frontal bone lesion, likely representing a
benign osteoma. No further evaluation is felt to be necessary.

## 2019-11-01 IMAGING — DX DG LUMBAR SPINE COMPLETE 4+V
5 series · 5 of 5 positions shown · non-contrast
Comparison: None.

CLINICAL DATA: Found on for following fall

EXAM:
LUMBAR SPINE - COMPLETE 4+ VIEW

[l-spine ap]
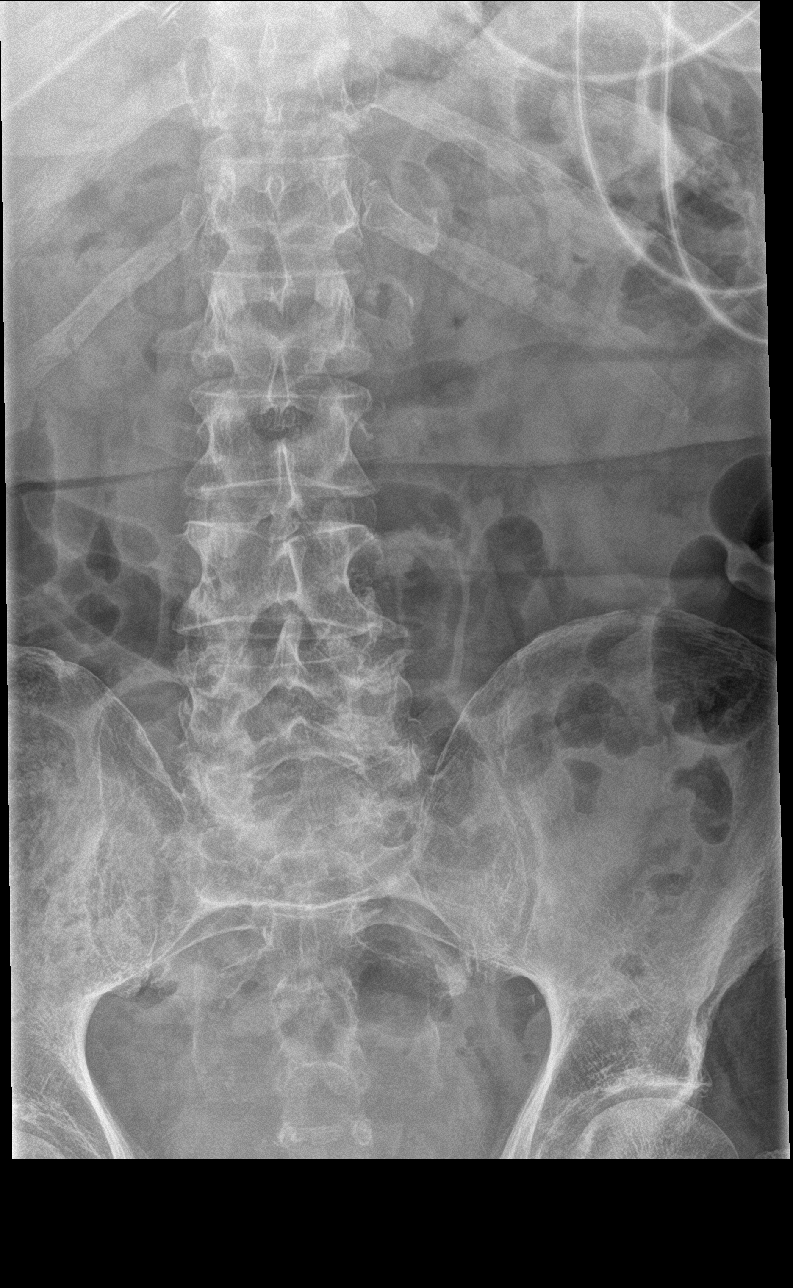

[l-spine obl (1 of 2)]
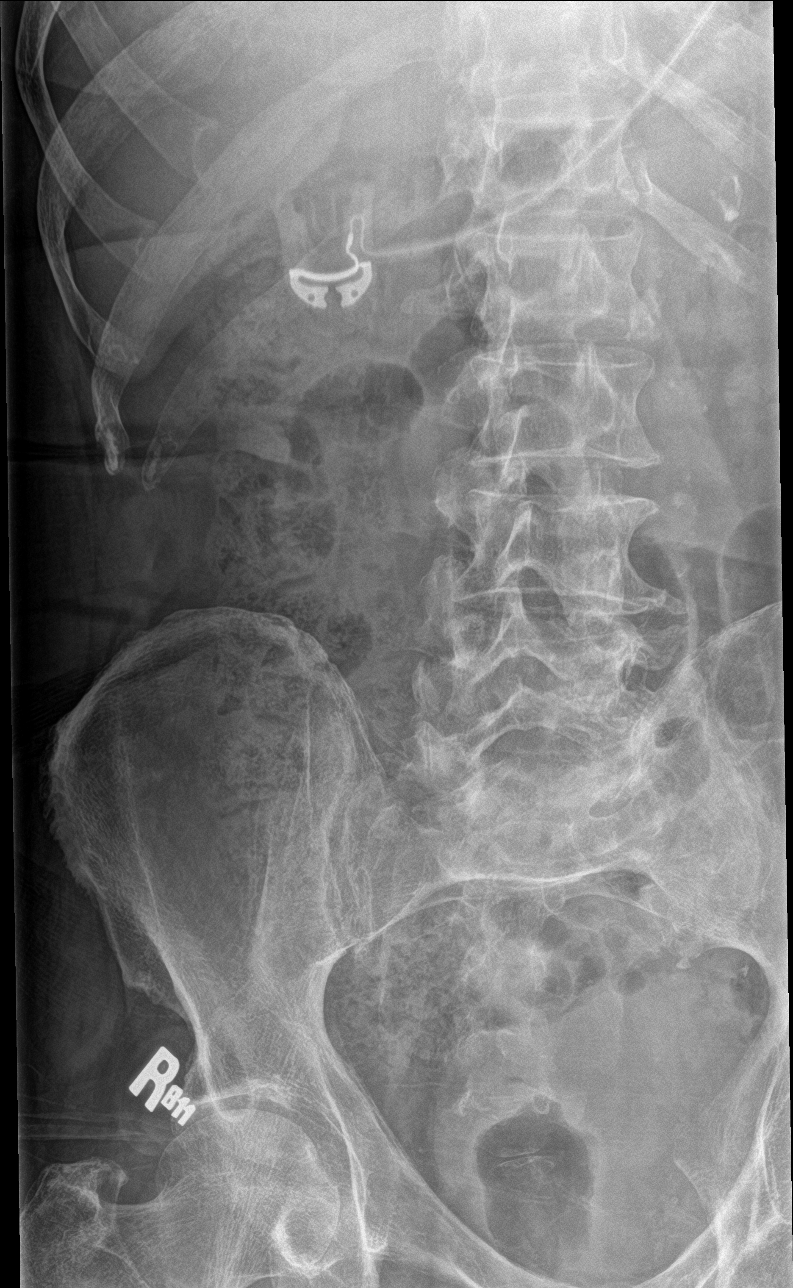

[l-spine obl (2 of 2)]
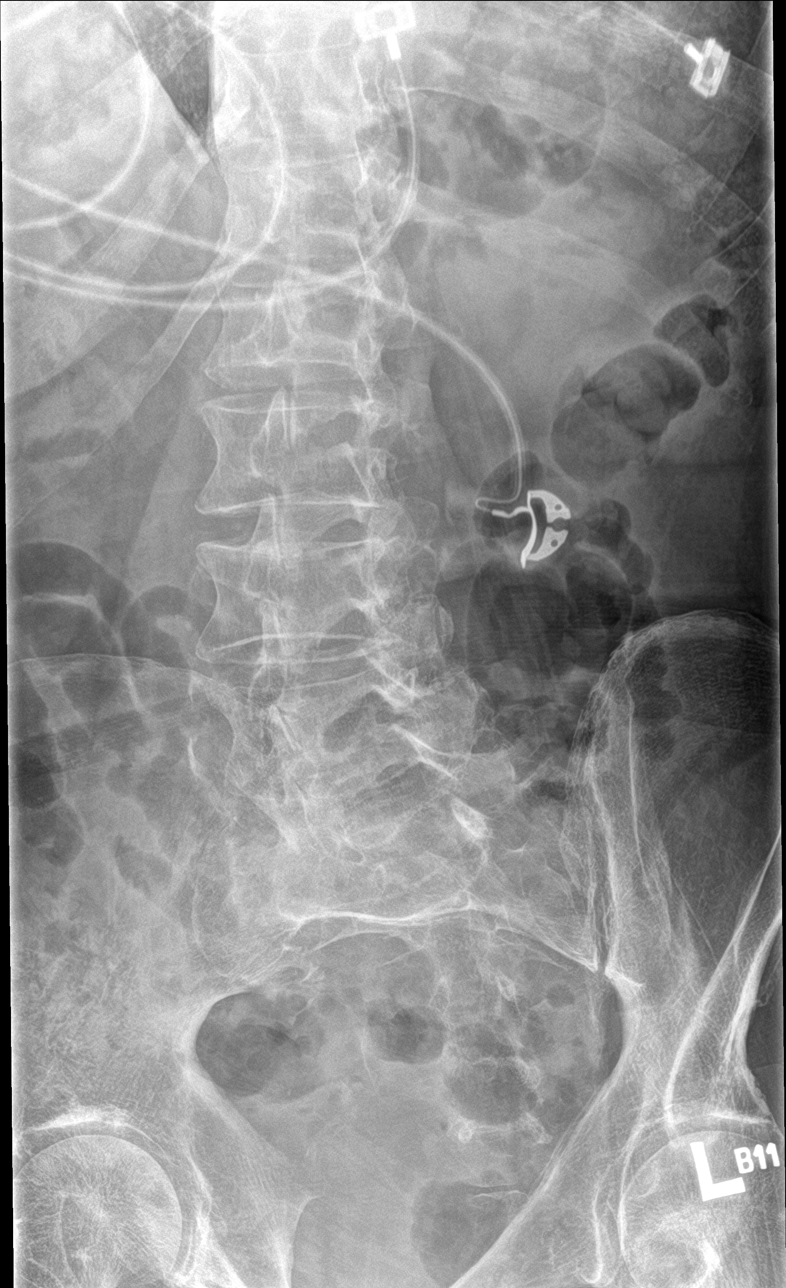

[l-spine lat]
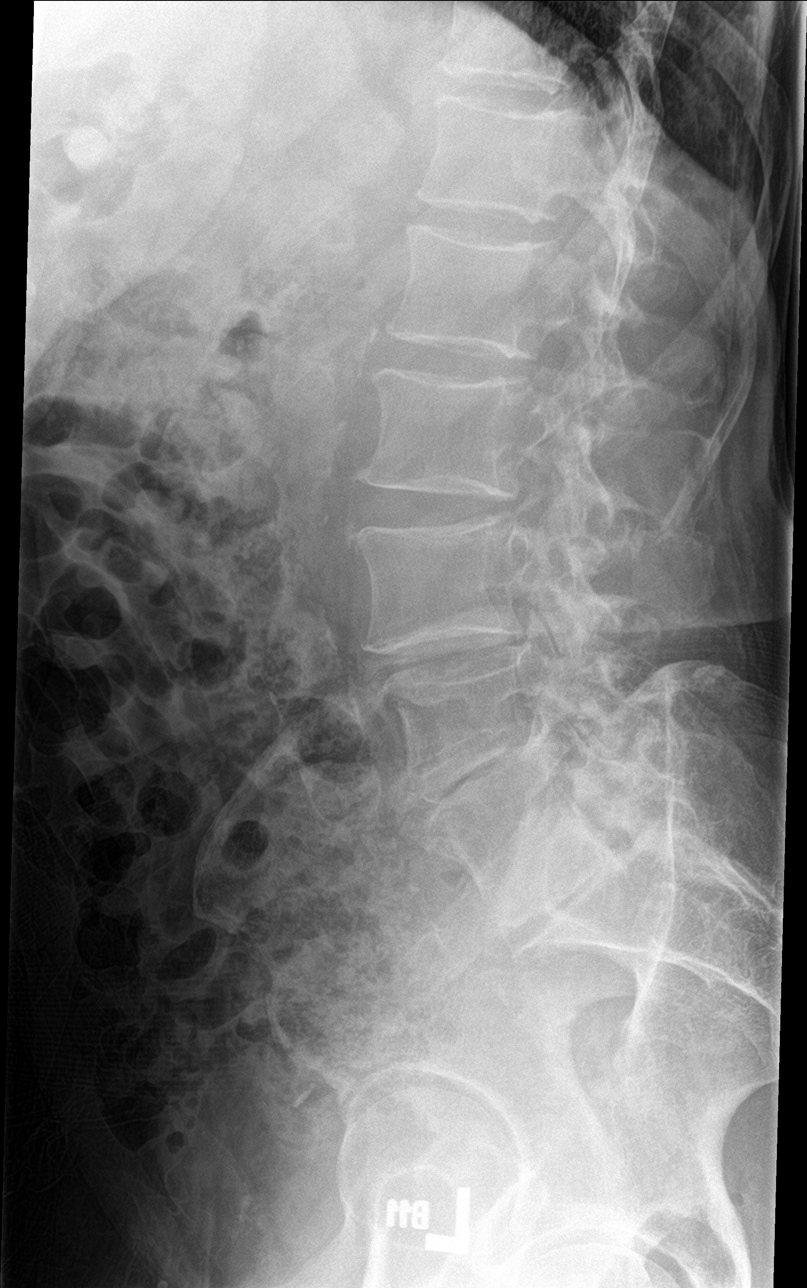

[l-spine spot]
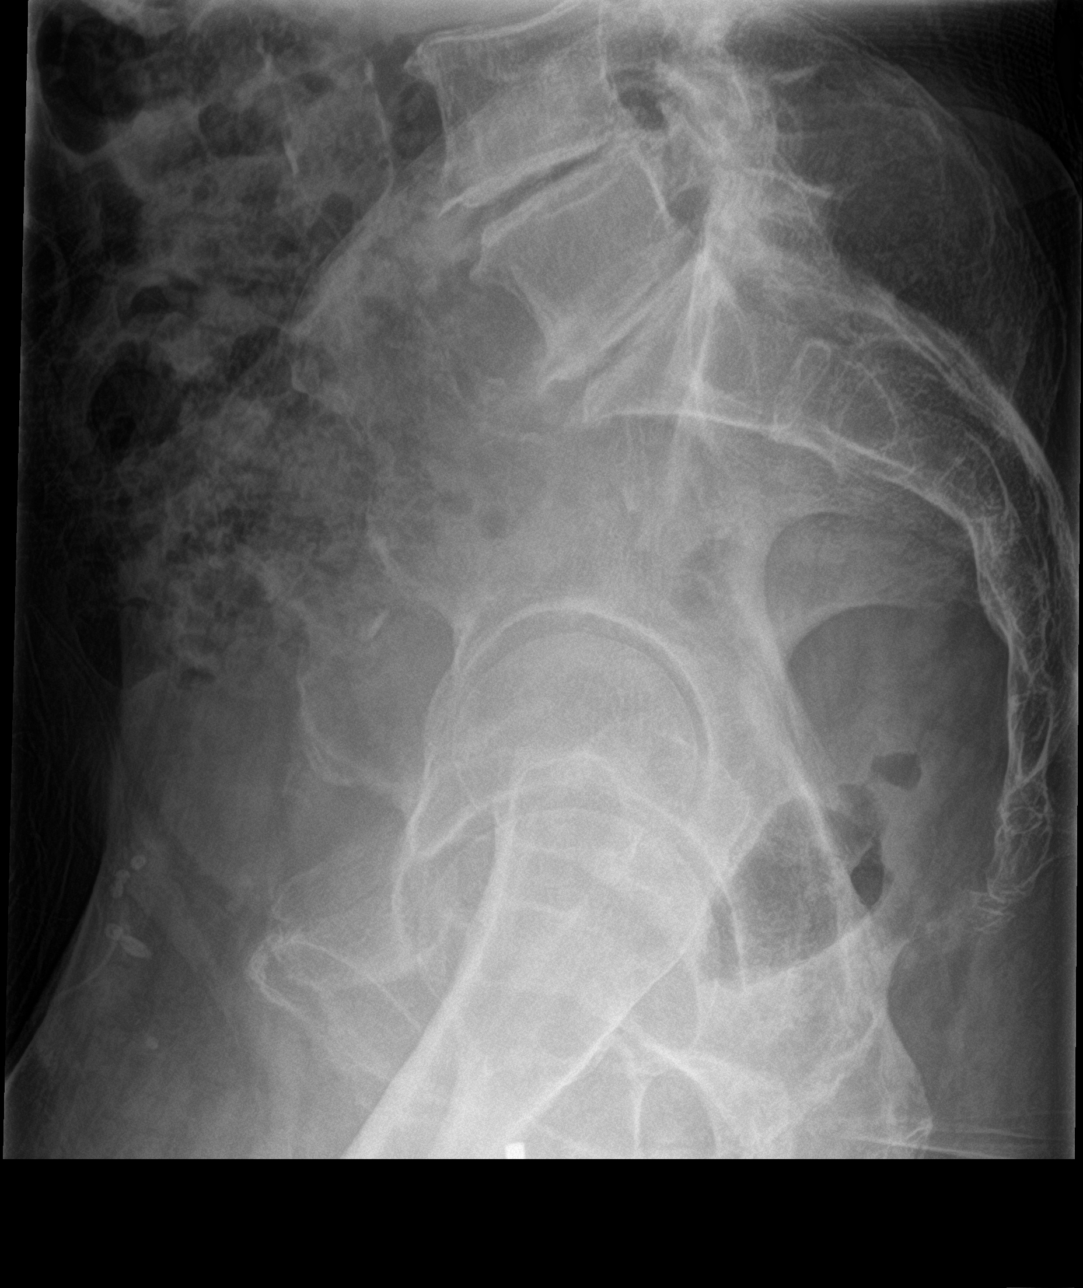

[5 of 5 positions shown; findings below may reference images not displayed]

FINDINGS: Five lumbar type vertebral bodies are well visualized. Vertebral
body height is well maintained. No pars defects are noted. No
anterolisthesis is seen. Disc space narrowing is noted at L3-4 L4-5
and L5-S1 with associated osteophytes. No soft tissue abnormality is
noted.
IMPRESSION: Degenerative change without acute abnormality.
